# Patient Record
Sex: Male | Born: 1965 | Race: White | Hispanic: No | Marital: Single | Smoking: Former smoker
Health system: Southern US, Community
[De-identification: ages and names within clinical notes are randomized; demographics above are authoritative.]

## PROBLEM LIST (undated history)

## (undated) DIAGNOSIS — K219 Gastro-esophageal reflux disease without esophagitis: Secondary | ICD-10-CM

## (undated) DIAGNOSIS — E669 Obesity, unspecified: Secondary | ICD-10-CM

## (undated) DIAGNOSIS — I10 Essential (primary) hypertension: Secondary | ICD-10-CM

## (undated) DIAGNOSIS — E119 Type 2 diabetes mellitus without complications: Secondary | ICD-10-CM

## (undated) DIAGNOSIS — R079 Chest pain, unspecified: Secondary | ICD-10-CM

## (undated) DIAGNOSIS — I2510 Atherosclerotic heart disease of native coronary artery without angina pectoris: Secondary | ICD-10-CM

## (undated) DIAGNOSIS — R0683 Snoring: Secondary | ICD-10-CM

## (undated) DIAGNOSIS — G4730 Sleep apnea, unspecified: Secondary | ICD-10-CM

## (undated) DIAGNOSIS — E039 Hypothyroidism, unspecified: Secondary | ICD-10-CM

## (undated) DIAGNOSIS — K589 Irritable bowel syndrome without diarrhea: Secondary | ICD-10-CM

## (undated) DIAGNOSIS — E785 Hyperlipidemia, unspecified: Secondary | ICD-10-CM

## (undated) DIAGNOSIS — M1990 Unspecified osteoarthritis, unspecified site: Secondary | ICD-10-CM

## (undated) DIAGNOSIS — R1310 Dysphagia, unspecified: Secondary | ICD-10-CM

## (undated) HISTORY — DX: Hyperlipidemia, unspecified: E78.5

## (undated) HISTORY — DX: Essential (primary) hypertension: I10

## (undated) HISTORY — PX: SHOULDER ARTHROSCOPY W/ ROTATOR CUFF REPAIR: SHX2400

## (undated) HISTORY — PX: ANKLE SURGERY: SHX546

## (undated) HISTORY — DX: Hypothyroidism, unspecified: E03.9

## (undated) HISTORY — DX: Atherosclerotic heart disease of native coronary artery without angina pectoris: I25.10

## (undated) HISTORY — PX: CHOLECYSTECTOMY: SHX55

## (undated) HISTORY — DX: Irritable bowel syndrome, unspecified: K58.9

## (undated) HISTORY — PX: KNEE ARTHROSCOPY: SHX127

## (undated) HISTORY — DX: Sleep apnea, unspecified: G47.30

---

## 1999-11-22 ENCOUNTER — Ambulatory Visit (HOSPITAL_BASED_OUTPATIENT_CLINIC_OR_DEPARTMENT_OTHER): Admission: RE | Admit: 1999-11-22 | Discharge: 1999-11-22 | Payer: Self-pay | Admitting: Orthopedic Surgery

## 2000-07-07 ENCOUNTER — Ambulatory Visit (HOSPITAL_COMMUNITY): Admission: RE | Admit: 2000-07-07 | Discharge: 2000-07-07 | Payer: Self-pay | Admitting: Orthopedic Surgery

## 2004-06-11 ENCOUNTER — Encounter: Admission: RE | Admit: 2004-06-11 | Discharge: 2004-06-11 | Payer: Self-pay | Admitting: Internal Medicine

## 2004-11-23 ENCOUNTER — Ambulatory Visit: Payer: Self-pay | Admitting: Internal Medicine

## 2005-01-18 ENCOUNTER — Ambulatory Visit: Payer: Self-pay | Admitting: Internal Medicine

## 2005-09-13 ENCOUNTER — Ambulatory Visit: Payer: Self-pay | Admitting: Internal Medicine

## 2005-10-04 ENCOUNTER — Encounter: Admission: RE | Admit: 2005-10-04 | Discharge: 2005-10-04 | Payer: Self-pay | Admitting: General Surgery

## 2005-10-04 ENCOUNTER — Ambulatory Visit: Payer: Self-pay | Admitting: Internal Medicine

## 2005-11-20 ENCOUNTER — Encounter (INDEPENDENT_AMBULATORY_CARE_PROVIDER_SITE_OTHER): Payer: Self-pay | Admitting: *Deleted

## 2005-11-20 ENCOUNTER — Ambulatory Visit (HOSPITAL_COMMUNITY): Admission: RE | Admit: 2005-11-20 | Discharge: 2005-11-20 | Payer: Self-pay | Admitting: General Surgery

## 2005-12-18 ENCOUNTER — Encounter: Admission: RE | Admit: 2005-12-18 | Discharge: 2005-12-18 | Payer: Self-pay | Admitting: General Surgery

## 2007-11-04 ENCOUNTER — Ambulatory Visit: Payer: Self-pay | Admitting: Family Medicine

## 2007-11-04 DIAGNOSIS — F17200 Nicotine dependence, unspecified, uncomplicated: Secondary | ICD-10-CM | POA: Insufficient documentation

## 2007-11-04 DIAGNOSIS — J019 Acute sinusitis, unspecified: Secondary | ICD-10-CM

## 2010-12-06 ENCOUNTER — Encounter: Payer: Self-pay | Admitting: Family Medicine

## 2010-12-06 ENCOUNTER — Encounter: Payer: Self-pay | Admitting: Internal Medicine

## 2010-12-07 ENCOUNTER — Encounter: Payer: Self-pay | Admitting: Internal Medicine

## 2010-12-07 ENCOUNTER — Ambulatory Visit (INDEPENDENT_AMBULATORY_CARE_PROVIDER_SITE_OTHER): Payer: Self-pay | Admitting: Internal Medicine

## 2010-12-07 DIAGNOSIS — R079 Chest pain, unspecified: Secondary | ICD-10-CM

## 2010-12-07 LAB — BASIC METABOLIC PANEL
BUN: 10 mg/dL (ref 6–23)
CO2: 28 mEq/L (ref 19–32)
Calcium: 9.2 mg/dL (ref 8.4–10.5)
Chloride: 105 mEq/L (ref 96–112)
Creatinine, Ser: 1.1 mg/dL (ref 0.4–1.5)
GFR: 76.62 mL/min (ref >60.00)
Glucose, Bld: 96 mg/dL (ref 70–99)
Potassium: 4.3 mEq/L (ref 3.5–5.1)
Sodium: 140 mEq/L (ref 135–145)

## 2010-12-07 LAB — LDL CHOLESTEROL, DIRECT: Direct LDL: 190.5 mg/dL

## 2010-12-07 LAB — LIPID PANEL
Cholesterol: 241 mg/dL — ABNORMAL HIGH (ref 0–200)
HDL: 31.8 mg/dL — ABNORMAL LOW (ref >39.00)
Total CHOL/HDL Ratio: 8
Triglycerides: 134 mg/dL (ref 0.0–149.0)
VLDL: 26.8 mg/dL (ref 0.0–40.0)

## 2010-12-07 LAB — TSH: TSH: 9.32 u[IU]/mL — ABNORMAL HIGH (ref 0.35–5.50)

## 2010-12-07 MED ORDER — OLMESARTAN MEDOXOMIL 20 MG PO TABS: 20.0000 mg | ORAL_TABLET | Freq: Every day | ORAL | Status: DC

## 2010-12-07 NOTE — Progress Notes (Signed)
  Subjective:    Patient ID: Gabriel [name], male    DOB: 04-Apr-1966, 45 y.o.   MRN: [id number]  HPI  The pt has strong family risks for CAD and has gained significant weight. This has resulted in increased blood pressure, multiple musculo-skeletal complaints and GERD. He has noted mid sternal chest pain after lifting his dog. He was SOB and felt pulses in temples were increased. He has asthma/early COPD.      Review of Systems  Constitutional: Positive for activity change, appetite change and fatigue. Negative for fever.  HENT: Negative for hearing loss, congestion, neck pain and postnasal drip.   Eyes: Negative for discharge, redness and visual disturbance.  Respiratory: Negative for cough, shortness of breath and wheezing.   Cardiovascular: Negative for leg swelling.  Gastrointestinal: Negative for abdominal pain, constipation and abdominal distention.  Genitourinary: Negative for urgency and frequency.  Musculoskeletal: Negative for joint swelling and arthralgias.  Skin: Negative for color change and rash.  Neurological: Negative for weakness and light-headedness.  Hematological: Negative for adenopathy.  Psychiatric/Behavioral: Negative for behavioral problems.   No past medical history on file. Past Surgical History  Procedure Date  . Rotator cuff repair     reports that he quit smoking about 3 years ago. He does not have any smokeless tobacco history on file. He reports that he does not drink alcohol or use illicit drugs. family history includes Coronary artery disease in an unspecified family member; Diabetes in an unspecified family member; and Heart disease in an unspecified family member.  He is adopted. No Known Allergies      Objective:   Physical Exam  Constitutional: He appears well-developed and well-nourished.       Morbidly obese  HENT:  Head: Normocephalic and atraumatic.  Eyes: Conjunctivae are normal. Pupils are equal, round, and reactive to light.    Neck: Normal range of motion. Neck supple.  Cardiovascular: Normal rate and regular rhythm.   Pulmonary/Chest: Effort normal and breath sounds normal.  Abdominal: Soft. Bowel sounds are normal.          Assessment & Plan:  I believe this is atypical chest pain that is not currently related to cardiovascular disease but certainly has risk factors for developing cardiovascular disease.  His EKG today is normal his exam is normal except for morbid obesity.  His gain significant amount of weight which is affecting his esophagus and esophageal reflux is affecting his musculoskeletal in terms of degenerative knee disease as well as probable early hip and shoulder disease and is affecting his breathing and giving him a restrictive asthmatic picture and Tylenol on him to fully recover from the damage he did with smoking now that he is stopped smoking.  His blood pressure is elevated and we will need to control his blood pressure but most aggressively we'll need to begin a weight loss program or he will fall the family history of his mother and father early I do not see a need for a stress test but his blood pressure failed to be controlled initially felt not to control his then screening for cardiovascular disease will be important in the near-term future should his chest pain recur she'll pattern then a cardiovascular stress test should also be pursued.

## 2011-01-25 NOTE — Op Note (Signed)
[facility]  Patient:    Gabriel [name], Gabriel [name]                    MRN: [id number] Proc. Date: 11/22/99 Adm. Date:  [id number] Attending:  Marlowe Kays Page                           Operative Report  PREOPERATIVE DIAGNOSIS:  Torn medial meniscus right knee.  POSTOPERATIVE DIAGNOSIS:  Torn medial meniscus right knee.  PROCEDURE:  Right knee arthroscopy with partial medial meniscectomy.  SURGEON:  Illene Labrador. Aplington, M.D.  ASSISTANT:  Nurse.  ANESTHESIA:  MAC.  JUSTIFICATION FOR PROCEDURE:  He has had pain in the inner aspect of his right nee after stumbling down some steps, wrenching his knee.  He was originally seen by  another orthopedic surgeon in town, who had an MRI performed on May 04, 1999, which demonstrated a complex tear of the posterior horn of the medial meniscus.  Due to insurance change, he saw me on February 26 and scheduled for surgery at his time.  At surgery, he had the below mentioned pathology.  JUSTIFICATION FOR OUTPATIENT SETTING:  He was in satisfactory health and this is felt to be a low morbidity procedure.  PROCEDURE:  Satisfactory knee block by anesthesiologists, which I supplemented ith 0.5% Marcaine with adrenalin into superomedially portal.  A pneumatic tourniquet applied, but not inflated.  Thigh stabilizer and knee was prepped with Duraprep, draped in a sterile field.  Superomedial saline inflow, first translateral portal, medial compartment of knee joint was evaluated.  In the anterior joint, he had quite a bit of fibrotic synovium overlying the meniscus, which I resected with  3.5 shaver.  His joint surfaces looked fine.  Looking in the back, exploring the posterior medial meniscus with a probe, on the superior weightbearing surface, o abnormality was evident.  However, I kept probing because of the MRI finding and at the posterior curve, he had a cleavage tear along the synovial margin for  about a cm, which was demonstrated with a probe.  I then resected the meniscus back to  stable rim with baskets and 3.5 shaver making sure to taper the two ends of the  resection so that this was a smooth taper.  Post resection films were taken at his time of this dictation, we were trying to double check on having them processed  because they came out black on the initial printing.  The initial four films of the anterior joint and the probing of the underneath surface of tear came out normally. I then looked up in the medial gutters and suprapatellar area and no abnormalities were noted.  I then reversed portals, his ACL was intact.  There was some synovitis laterally, which did not interfere with viewing of the joint, which was normal. A picture of the lateral meniscus was taken as well.  The knee joint was then irrigated until clear and all fluid possible was removed.  The two anterior portals were closed with 4-0 nylon, 20 cc 0.5% Marcaine with adrenalin, 4 mg of morphine were then instilled through the inflow apparatus, which was removed and this portal closed with 4-0 nylon as well.  Betadine, Adaptic, dry sterile dressing were applied.  He tolerated the procedure well and was taken to recovery room in satisfactory condition with no known complications. DD:  11/22/99 TD:  11/22/99 Job: [id number]

## 2011-01-25 NOTE — Op Note (Signed)
NAME:  Gabriel [name], Gabriel [name]             ACCOUNT NO.:  [id number]   MEDICAL RECORD NO.:  [id number]          PATIENT TYPE:  AMB   LOCATION:  DAY                          FACILITY:  [facility]   PHYSICIAN:  Ollen Gross. Vernell Morgans, M.D. DATE OF BIRTH:  [birth year]   DATE OF PROCEDURE:  11/20/2005  DATE OF DISCHARGE:  11/20/2005                                 OPERATIVE REPORT   PREOPERATIVE DIAGNOSIS:  Gallstones.   POSTOPERATIVE DIAGNOSIS:  Cholecystitis with cholelithiasis.   PROCEDURES:  Laparoscopic cholecystectomy with intraoperative cholangiogram.   SURGEON:  Ollen Gross. Carolynne Edouard, M.D.   ANESTHESIA:  General endotracheal.   ASSISTANT:  Dr. Orson Slick.   PROCEDURE:  After informed consent was obtained, the patient was brought to  the operating room, placed in a supine position on the operating table.  After adequate induction of general anesthesia, the patient's abdomen was  prepped with Betadine and draped in the usual sterile manner. The area below  the umbilicus was infiltrated with 0.25% Marcaine, a small incision was made  with a 15 blade knife. This incision was carried down through the  subcutaneous tissue bluntly with a hemostat and [employer] retractors until  the linea alba was identified. The line alba was incised with a 15 blade  knife and each side was grasped Kocher clamps and elevated anteriorly. The  preperitoneal space was then probed bluntly with a hemostat until the  peritoneum was opened and access was gained to the abdominal cavity. A #0  Vicryl pursestring stitch was placed in the fascia surrounding the opening,  Hasson cannula was placed through the opening and anchored in place with the  previously placed Vicryl pursestring stitch. The abdomen was then  insufflated with carbon dioxide without difficulty. The patient was placed  in a head-up position, rotated slightly with the right side up. Next the a  laparoscope was then inserted through the Hasson cannula, right upper  quadrant  was inspected, the dome of the gallbladder and liver were readily  identified. Next the epigastric region was infiltrated with 0.25% Marcaine.  A small incision was made with a 15 blade knife and a 10 mm port was placed  bluntly through this incision into the abdominal cavity under direct vision.  Sites were then chosen laterally on the right side of the abdomen for  placement of 5 mm ports. Each of these areas was infiltrated with 0.25%  Marcaine. Small stab incisions were made with a 15 blade knife and 5 mm  ports were placed bluntly through these incisions into the abdominal cavity  under direct vision. A blunt grasper was placed through the lateral most 5  mm port and used to grasp the dome of gallbladder and elevate it anteriorly  and superiorly. Another blunt grasper was placed through the other 5 mm port  and used to retract on the body and neck of the gallbladder. A dissector was  then placed through the epigastric port and using the electrocautery, the  peritoneal reflection at the gallbladder neck area was opened. Blunt  dissection was then carried out in this area until the gallbladder neck  cystic  duct junction was readily identified and a good window was created. A  single clip was placed on the gallbladder neck, a small ductotomy was made  just below the clip with the laparoscopic scissors. A 14 gauge Angiocath was  then placed percutaneously through the anterior abdominal wall under direct  vision. The Reddick cholangiogram catheter was placed through the Angiocath  and flushed. The Reddick catheter was then placed in the cystic duct and  anchored in place with the clip. A cholangiogram was obtained that showed no  filling defects, good emptying into the duodenum and adequate length on the  cystic duct. The anchoring clip and catheters were then removed from the  patient. Three clips were placed proximally on the cystic duct and the duct  was divided between the 2 sets of  clips. Posterior to this, the cystic  artery was identified and was again dissected bluntly in a circumferential  manner until a good window was created. Two clips were placed proximally and  one distally on the artery and the artery was divided between the two. Next,  a laparoscopic hook cautery device was used to separate the gallbladder from  the liver bed. Prior to completely detaching the gallbladder from the liver  bed, the liver bed was inspected and several small bleeding points were  coagulated with the electrocautery until the area was completely hemostatic.  The gallbladder was then detached the rest of the way from the liver bed  without difficulty. Next, the laparoscopic bag was inserted through the  epigastric port, the gallbladder was replaced within the bag and the bag was  sealed. The abdomen was then irrigated with copious amounts of saline until  the effluent was clear. The laparoscope was then removed through the  epigastric port and a gallbladder grasper was placed through the Hasson  cannula and used to grasp the opening of the bag. The bag with the  gallbladder was then removed through the infraumbilical port with the Hasson  cannula without difficulty. The fascial defect was closed with the  previously placed pursestring stitch as well as with another figure-of-eight  #0 Vicryl stitch. The rest of the ports were then removed under direct  vision and were found to be hemostatic. Gas was allowed to escape, the skin  incisions were all closed with interrupted 4-0 Monocryl subcuticular  stitches. Benzoin, Steri-Strips and sterile dressings were applied. The  patient tolerated the procedure well. At the end of the case, all needle,  sponge and instrument counts were correct. The patient was then awakened and  taken to the recovery room in stable condition.      Ollen Gross. Vernell Morgans, M.D.  Electronically Signed    PST/MEDQ  D:  11/21/2005  T:  11/22/2005  Job:   [id number]

## 2011-01-25 NOTE — Op Note (Signed)
[facility]  Patient:    [name], [name]                      MRN: [id number] Proc. Date: 07/07/00 Attending:  Fayrene Fearing P. Aplington, M.D.                           Operative Report  PREOPERATIVE DIAGNOSIS:  Recurrent medial meniscus tear, right knee.  POSTOPERATIVE DIAGNOSIS:  Recurrent medial meniscus tear, right knee.  OPERATION:  Right knee arthroscopy with partial medial meniscectomy.  SURGEON:  Illene Labrador. Aplington, M.D.  ANESTHESIA:  MAC.  INDICATIONS:  His original arthroscopy and medial meniscectomy was done on November 22, 1999.  He did well following the procedure for quite a few months but came to see me the end of September with a history of recurrent pain in the inner aspect and posterior aspect of his right knee of six to eight weeks duration.  An MRI demonstrated recurrent medial meniscus tear.  No other abnormalities were noted in the joint, and this was confirmed at surgery.  DESCRIPTION OF PROCEDURE:  Knee block by anesthesiology.  I infiltrated the portals with 0.5% Xylocaine with Adrenalin.  Pneumatic tourniquet was applied but not inflated.  Thigh stabilizer.  He was prepped with DuraPrep and draped in a sterile field.  After the portals were infiltrated, through an anterior medial portal we first examined the medial knee joint.  There was a little bit of roughening of the anterior third of the medial meniscus and some synovitis which I resected.  His knee was quite tight.  Posteriorly he did have the comminuted tear noted on the MRI.  This was pictured and resected back to stable rim with a combination of small baskets and 3.5 shaver with also a small component of the tear going over into the intracondylar notch area. Final pictures taken.  I then looked up in the medial and subpatellar area. Minimal wear on the patella but nothing required surgically.  I then reversed the portals.  The ACL, lateral meniscus were intact.  The lateral  gutters were unremarkable.  The knee joint was then irrigated until clear.  All trocars were removed.  The transverse portals were closed with 4-0 nylon; 20 cc of Marcaine with Adrenalin and 4 mg of Morphine was instilled through the inflow cannula which was then removed and this portal closed with 4-0 nylon as well. ABD, Adaptic and dry sterile dressing were applied.  At the time of this dictation, the patient was on his way to the recovery room in satisfactory condition with no known complications. DD:  07/07/00 TD:  07/07/00 Job: [id number]

## 2011-01-28 ENCOUNTER — Telehealth: Payer: Self-pay | Admitting: Family Medicine

## 2011-01-28 NOTE — Telephone Encounter (Signed)
Pt called and said that he is normally a pt of Dr Lovell Sheehan, but for some reason the pcp on his chart has been changed to Dr Clent Ridges. Pt is req to remain pt of Dr Lovell Sheehan. Pls advise.

## 2011-01-28 NOTE — Telephone Encounter (Signed)
Changed in computer.

## 2011-02-06 ENCOUNTER — Encounter: Payer: Self-pay | Admitting: Family Medicine

## 2011-02-06 ENCOUNTER — Ambulatory Visit (INDEPENDENT_AMBULATORY_CARE_PROVIDER_SITE_OTHER): Payer: Self-pay | Admitting: Family Medicine

## 2011-02-06 VITALS — BP 114/78 | HR 93 | Temp 98.4°F | Resp 16 | Wt 285.0 lb

## 2011-02-06 DIAGNOSIS — E1169 Type 2 diabetes mellitus with other specified complication: Secondary | ICD-10-CM | POA: Insufficient documentation

## 2011-02-06 DIAGNOSIS — E785 Hyperlipidemia, unspecified: Secondary | ICD-10-CM

## 2011-02-06 DIAGNOSIS — I10 Essential (primary) hypertension: Secondary | ICD-10-CM | POA: Insufficient documentation

## 2011-02-06 DIAGNOSIS — E039 Hypothyroidism, unspecified: Secondary | ICD-10-CM | POA: Insufficient documentation

## 2011-02-06 MED ORDER — LEVOTHYROXINE SODIUM 100 MCG PO TABS: 100.0000 ug | ORAL_TABLET | Freq: Every day | ORAL | Status: DC

## 2011-02-06 MED ORDER — LOSARTAN POTASSIUM 100 MG PO TABS: 100.0000 mg | ORAL_TABLET | Freq: Every day | ORAL | Status: DC

## 2011-02-06 MED ORDER — TRIAMCINOLONE ACETONIDE 0.1 % EX CREA: TOPICAL_CREAM | Freq: Two times a day (BID) | CUTANEOUS | Status: AC

## 2011-02-06 NOTE — Progress Notes (Signed)
  Subjective:    Patient ID: Gabriel [name], male    DOB: 17-Sep-1965, 45 y.o.   MRN: [id number]  HPI Here to follow up on HTN after he was seen here by Dr. Lovell Sheehan 2 months ago for elevated BP. He was given samples for Benicar that day, and he has been taking since then. His BP went down to normal quickly, and it has been stable since. He had labs drawn that day that showed significant hypothyroidism, but for some reason he was not notified of this and it has not been treated. He knows he is very overweight and he has tried to adjust his diet. He gets almost no exercise, but he plans to start riding his bicycle soon. He has gained yet another pound since his last visit. He had some chest pains last time that were felt to be muscular in origin, and he has had no further chest pains since then. Also on his last lab report he had a very high LDL at 190. His glucose was fine.    Review of Systems  Constitutional: Negative.   Respiratory: Negative.   Cardiovascular: Negative.        Objective:   Physical Exam  Constitutional:       Morbidly obese  Neck: No thyromegaly present.  Cardiovascular: Normal rate, regular rhythm, normal heart sounds and intact distal pulses.   Pulmonary/Chest: Effort normal and breath sounds normal.  Lymphadenopathy:    He has no cervical adenopathy.          Assessment & Plan:  I strongly urged him to exercise and to lose weight. Getting his thyroid level under control will help of course. Start on Synthroid and recheck a TSH in 90 days. Recheck lipids in 6 months and treat if needed. His HTN is stable. Switch to generic meds.

## 2011-02-08 ENCOUNTER — Telehealth: Payer: Self-pay | Admitting: *Deleted

## 2011-02-08 MED ORDER — PREDNISONE (PAK) 10 MG PO TABS: 10.0000 mg | ORAL_TABLET | Freq: Every day | ORAL | Status: AC

## 2011-02-08 NOTE — Telephone Encounter (Signed)
Pt is still having problems with his poison ivy, and would like Dr. Clent Ridges to send in a steroid to [facility] [address].

## 2011-02-08 NOTE — Telephone Encounter (Signed)
Call in a 12 day taper pack of 10 mg Prednisone

## 2011-05-09 ENCOUNTER — Ambulatory Visit: Payer: Self-pay | Admitting: Internal Medicine

## 2011-06-14 ENCOUNTER — Ambulatory Visit: Payer: Self-pay | Admitting: Internal Medicine

## 2011-06-19 ENCOUNTER — Ambulatory Visit (INDEPENDENT_AMBULATORY_CARE_PROVIDER_SITE_OTHER): Payer: Self-pay | Admitting: Internal Medicine

## 2011-06-19 VITALS — BP 130/80 | HR 84 | Temp 98.2°F | Resp 20 | Ht 68.0 in | Wt 298.0 lb

## 2011-06-19 DIAGNOSIS — R635 Abnormal weight gain: Secondary | ICD-10-CM

## 2011-06-19 DIAGNOSIS — J329 Chronic sinusitis, unspecified: Secondary | ICD-10-CM

## 2011-06-19 DIAGNOSIS — I10 Essential (primary) hypertension: Secondary | ICD-10-CM

## 2011-06-19 DIAGNOSIS — E039 Hypothyroidism, unspecified: Secondary | ICD-10-CM

## 2011-06-19 DIAGNOSIS — J3089 Other allergic rhinitis: Secondary | ICD-10-CM

## 2011-06-19 LAB — T4, FREE: Free T4: 0.99 ng/dL (ref 0.60–1.60)

## 2011-06-19 LAB — BASIC METABOLIC PANEL
BUN: 8 mg/dL (ref 6–23)
CO2: 30 mEq/L (ref 19–32)
Calcium: 8.8 mg/dL (ref 8.4–10.5)
Chloride: 105 mEq/L (ref 96–112)
Creatinine, Ser: 1.1 mg/dL (ref 0.4–1.5)
GFR: 74.87 mL/min (ref >60.00)
Glucose, Bld: 150 mg/dL — ABNORMAL HIGH (ref 70–99)
Potassium: 4.2 mEq/L (ref 3.5–5.1)
Sodium: 141 mEq/L (ref 135–145)

## 2011-06-19 LAB — T3, FREE: T3, Free: 2.9 pg/mL (ref 2.3–4.2)

## 2011-06-19 LAB — TSH: TSH: 1.46 u[IU]/mL (ref 0.35–5.50)

## 2011-06-19 LAB — HEMOGLOBIN A1C: Hgb A1c MFr Bld: 6.8 % — ABNORMAL HIGH (ref 4.6–6.5)

## 2011-06-19 MED ORDER — FLUTICASONE FUROATE 27.5 MCG/SPRAY NA SUSP: 2.0000 | Freq: Every day | NASAL | Status: DC

## 2011-06-19 NOTE — Patient Instructions (Signed)
Portion control [employer] on line

## 2011-06-25 ENCOUNTER — Other Ambulatory Visit: Payer: Self-pay | Admitting: *Deleted

## 2011-06-25 MED ORDER — METFORMIN HCL 500 MG PO TABS: 500.0000 mg | ORAL_TABLET | Freq: Two times a day (BID) | ORAL | Status: DC

## 2011-07-22 ENCOUNTER — Ambulatory Visit (INDEPENDENT_AMBULATORY_CARE_PROVIDER_SITE_OTHER): Payer: Self-pay | Admitting: Internal Medicine

## 2011-07-22 DIAGNOSIS — Z0000 Encounter for general adult medical examination without abnormal findings: Secondary | ICD-10-CM

## 2011-07-22 DIAGNOSIS — Z23 Encounter for immunization: Secondary | ICD-10-CM

## 2011-07-23 ENCOUNTER — Ambulatory Visit: Payer: Self-pay | Admitting: Internal Medicine

## 2011-07-24 ENCOUNTER — Other Ambulatory Visit: Payer: Self-pay | Admitting: *Deleted

## 2011-07-24 ENCOUNTER — Telehealth: Payer: Self-pay | Admitting: *Deleted

## 2011-07-24 MED ORDER — CEPHALEXIN 500 MG PO CAPS: 500.0000 mg | ORAL_CAPSULE | Freq: Four times a day (QID) | ORAL | Status: DC

## 2011-07-24 MED ORDER — CEPHALEXIN 500 MG PO CAPS: 500.0000 mg | ORAL_CAPSULE | Freq: Four times a day (QID) | ORAL | Status: AC

## 2011-07-24 NOTE — Telephone Encounter (Signed)
Pt is afraid his tetanus injection is infected... Still hurts and red.  Requesting injection.

## 2011-07-24 NOTE — Telephone Encounter (Signed)
Talked with pt and it is the toe that he stuck with nail that is infected- per dr Lovell Sheehan may have keflex 500 qid for 10 days. And soak toe 3-4 times a day in warm salt water

## 2011-09-20 ENCOUNTER — Ambulatory Visit: Payer: Self-pay | Admitting: Internal Medicine

## 2012-03-01 ENCOUNTER — Other Ambulatory Visit: Payer: Self-pay | Admitting: Family Medicine

## 2012-04-03 ENCOUNTER — Other Ambulatory Visit: Payer: Self-pay | Admitting: Family Medicine

## 2012-06-08 ENCOUNTER — Encounter: Payer: Self-pay | Admitting: Internal Medicine

## 2012-06-08 NOTE — Progress Notes (Signed)
  Subjective:    Patient ID: Gabriel [name], male    DOB: June 08, 1966, 46 y.o.   MRN: [id number]  HPI Patient seen for acute sinusitis with headache   Review of Systems  HENT: Positive for congestion, rhinorrhea, neck pain and postnasal drip.   Neurological: Positive for headaches.       Objective:   Physical Exam  Constitutional: He appears well-developed and well-nourished.  HENT:  Head: Normocephalic and atraumatic.       Swollen nasal turbinates with mucopurulent discharge from the sinuses  Eyes: Conjunctivae normal are normal. Pupils are equal, round, and reactive to light.  Neck: Normal range of motion. Neck supple.  Cardiovascular: Normal rate and regular rhythm.   Pulmonary/Chest: Effort normal and breath sounds normal.  Abdominal: Soft. Bowel sounds are normal.          Assessment & Plan:  Acute sinusitis.  Antibiotic prescribed Avelox 400 mg by mouth daily for 7 days given a sample

## 2012-06-25 ENCOUNTER — Other Ambulatory Visit: Payer: Self-pay | Admitting: Internal Medicine

## 2012-09-28 ENCOUNTER — Other Ambulatory Visit: Payer: Self-pay | Admitting: Internal Medicine

## 2012-10-19 ENCOUNTER — Other Ambulatory Visit: Payer: Self-pay

## 2012-10-19 ENCOUNTER — Emergency Department (HOSPITAL_COMMUNITY): Payer: Self-pay

## 2012-10-19 ENCOUNTER — Emergency Department (HOSPITAL_COMMUNITY)
Admission: EM | Admit: 2012-10-19 | Discharge: 2012-10-19 | Disposition: A | Discharge status: Home / Self Care | Payer: Self-pay | Attending: Emergency Medicine | Admitting: Emergency Medicine

## 2012-10-19 ENCOUNTER — Encounter (HOSPITAL_COMMUNITY): Payer: Self-pay | Admitting: Emergency Medicine

## 2012-10-19 DIAGNOSIS — E119 Type 2 diabetes mellitus without complications: Secondary | ICD-10-CM | POA: Insufficient documentation

## 2012-10-19 DIAGNOSIS — M549 Dorsalgia, unspecified: Secondary | ICD-10-CM | POA: Insufficient documentation

## 2012-10-19 DIAGNOSIS — Z8719 Personal history of other diseases of the digestive system: Secondary | ICD-10-CM | POA: Insufficient documentation

## 2012-10-19 DIAGNOSIS — E669 Obesity, unspecified: Secondary | ICD-10-CM | POA: Insufficient documentation

## 2012-10-19 DIAGNOSIS — E039 Hypothyroidism, unspecified: Secondary | ICD-10-CM | POA: Insufficient documentation

## 2012-10-19 DIAGNOSIS — Z87891 Personal history of nicotine dependence: Secondary | ICD-10-CM | POA: Insufficient documentation

## 2012-10-19 DIAGNOSIS — R109 Unspecified abdominal pain: Secondary | ICD-10-CM | POA: Insufficient documentation

## 2012-10-19 DIAGNOSIS — E785 Hyperlipidemia, unspecified: Secondary | ICD-10-CM | POA: Insufficient documentation

## 2012-10-19 DIAGNOSIS — Z79899 Other long term (current) drug therapy: Secondary | ICD-10-CM | POA: Insufficient documentation

## 2012-10-19 DIAGNOSIS — I10 Essential (primary) hypertension: Secondary | ICD-10-CM | POA: Insufficient documentation

## 2012-10-19 HISTORY — DX: Obesity, unspecified: E66.9

## 2012-10-19 HISTORY — DX: Gastro-esophageal reflux disease without esophagitis: K21.9

## 2012-10-19 LAB — POCT I-STAT TROPONIN I: Troponin i, poc: 0 ng/mL (ref 0.00–0.08)

## 2012-10-19 LAB — CBC WITH DIFFERENTIAL/PLATELET
Basophils Absolute: 0.1 10*3/uL (ref 0.0–0.1)
Basophils Relative: 1 % (ref 0–1)
HCT: 43.2 % (ref 39.0–52.0)
Hemoglobin: 14.8 g/dL (ref 13.0–17.0)
Lymphocytes Relative: 40 % (ref 12–46)
MCHC: 34.3 g/dL (ref 30.0–36.0)
Monocytes Absolute: 0.7 10*3/uL (ref 0.1–1.0)
Monocytes Relative: 9 % (ref 3–12)
Neutro Abs: 3.8 10*3/uL (ref 1.7–7.7)
Neutrophils Relative %: 47 % (ref 43–77)
WBC: 8 10*3/uL (ref 4.0–10.5)

## 2012-10-19 LAB — TROPONIN I: Troponin I: <0.3 ng/mL (ref <0.30)

## 2012-10-19 LAB — COMPREHENSIVE METABOLIC PANEL
ALT: 42 U/L (ref 0–53)
BUN: 13 mg/dL (ref 6–23)
CO2: 26 mEq/L (ref 19–32)
Calcium: 9.2 mg/dL (ref 8.4–10.5)
Creatinine, Ser: 1.08 mg/dL (ref 0.50–1.35)
GFR calc Af Amer: >90 mL/min (ref >90)
GFR calc non Af Amer: 81 mL/min — ABNORMAL LOW (ref >90)
Glucose, Bld: 118 mg/dL — ABNORMAL HIGH (ref 70–99)
Total Protein: 7.2 g/dL (ref 6.0–8.3)

## 2012-10-19 MED ORDER — HYDROCODONE-ACETAMINOPHEN 5-325 MG PO TABS: 1.0000 | ORAL_TABLET | Freq: Four times a day (QID) | ORAL | Status: DC | PRN

## 2012-10-19 MED ORDER — MORPHINE SULFATE 4 MG/ML IJ SOLN
4.0000 mg | Freq: Once | INTRAMUSCULAR | Status: AC
  Administered 2012-10-19: 4 mg via INTRAVENOUS
  Filled 2012-10-19: qty 1

## 2012-10-19 MED ORDER — PROMETHAZINE HCL 25 MG PO TABS: 25.0000 mg | ORAL_TABLET | Freq: Four times a day (QID) | ORAL | Status: DC | PRN

## 2012-10-19 MED ORDER — IOHEXOL 300 MG/ML  SOLN
100.0000 mL | Freq: Once | INTRAMUSCULAR | Status: AC | PRN
  Administered 2012-10-19: 100 mL via INTRAVENOUS

## 2012-10-19 MED ORDER — IOHEXOL 300 MG/ML  SOLN
20.0000 mL | INTRAMUSCULAR | Status: AC
Start: 2012-10-19 — End: 2012-10-19

## 2012-10-19 NOTE — ED Notes (Signed)
Oral contrast given to patient with drinking instructions

## 2012-10-19 NOTE — ED Notes (Signed)
Ct notified that pt finished oral contrast.  

## 2012-10-19 NOTE — ED Notes (Signed)
Xray present at room

## 2012-10-19 NOTE — ED Notes (Signed)
Back from xray, no changes, family at [facility].

## 2012-10-19 NOTE — ED Notes (Signed)
To x-ray

## 2012-10-19 NOTE — ED Notes (Signed)
Pt reports taking advil 400mg  Q 6-8 hrs regularly for last several days.

## 2012-10-19 NOTE — ED Provider Notes (Signed)
History     CSN: [id number]  Arrival date & time 10/19/12  0554   First MD Initiated Contact with Patient 10/19/12 [phone]      Chief Complaint  Patient presents with  . Chest Pain    (Consider location/radiation/quality/duration/timing/severity/associated sxs/prior treatment) Patient is a 47 y.o. male presenting with chest pain and back pain.  Chest Pain Associated symptoms: back pain   Back Pain Associated symptoms: chest pain    Patient is a 46 yo male with a history of GERD, DM, Htn, and a 30 pack year history who presents toady with chest pain.  The pain started Friday morning in the right groin area where the patient has a history of a hernia from working for [employer] and lifting heavy items.  The pain then started to radiate to the right abdomen and then moved to the right middle back and anterior middle chest.  The pain was increased when laying flat and nothing seemed to make it better.  It was initially described as a sharp pain causing shortness of breath but now it's gone down to a dull pain 2/10.  Yesterday he started to notice that he was urinating less and it was burning with urination and was concerned for a kidney stone.  His bowel movements have also changed from having diarrhea to constipation over the last couple of days.  Has felt nauseated but denies fever, hematemesis, hematochezia, hematuria, recent trauma or cough.    Past Medical History  Diagnosis Date  . Hypothyroidism   . Hypertension   . Hyperlipidemia   . Diabetes mellitus without complication   . GERD (gastroesophageal reflux disease)   . Hypothyroidism   . Obesity     Past Surgical History  Procedure Laterality Date  . Rotator cuff repair    . Cholecystectomy    . Knee surgery    . Shoulder surgery      Family History  Problem Relation Age of Onset  . Adopted: Yes  . Coronary artery disease    . Heart disease    . Diabetes      History  Substance Use Topics  . Smoking status: Former  Smoker -- 1.50 packs/day    Quit date: 09/10/2007  . Smokeless tobacco: Not on file  . Alcohol Use: No      Review of Systems  Cardiovascular: Positive for chest pain.  Musculoskeletal: Positive for back pain.   All other systems negative except as documented in the HPI. All pertinent positives and negatives as reviewed in the HPI.  Allergies  Review of patient's allergies indicates no known allergies.  Home Medications   Current Outpatient Rx  Name  Route  Sig  Dispense  Refill  . EXPIRED: fluticasone (VERAMYST) 27.5 MCG/SPRAY nasal spray   Nasal   Place 2 sprays into the nose daily.   10 g   12   . ibuprofen (ADVIL,MOTRIN) 200 MG tablet   Oral   Take 200 mg by mouth every 6 (six) hours as needed.           [name] levothyroxine (SYNTHROID, LEVOTHROID) 100 MCG tablet      TAKE 1 TABLET BY MOUTH EVERY DAY   30 tablet   9   . losartan (COZAAR) 100 MG tablet      TAKE 1 TABLET BY MOUTH EVERY DAY   30 tablet   9   . metFORMIN (GLUCOPHAGE) 500 MG tablet      TAKE 1 TABLET BY MOUTH 2 TIMES  DAILY WITH A MEAL.   60 tablet   2   . Multiple Vitamins-Minerals (MULTIVITAMIN,TX-MINERALS) tablet   Oral   Take 1 tablet by mouth daily.             BP 143/78  Pulse 76  Temp(Src) 97.7 F (36.5 C) (Oral)  Resp 16  SpO2 98%  Physical Exam  Constitutional: He is oriented to person, place, and time. He appears well-developed and well-nourished.  HENT:  Head: Normocephalic and atraumatic.  Mouth/Throat: Oropharynx is clear and moist.  Eyes: Pupils are equal, round, and reactive to light.  Neck: Normal range of motion. Neck supple.  Cardiovascular: Normal rate, regular rhythm and normal heart sounds.  Exam reveals no gallop and no friction rub.   No murmur heard. Pulmonary/Chest: Effort normal and breath sounds normal. He exhibits tenderness.    Abdominal: Soft. Normal appearance. He exhibits no distension. There is tenderness. There is no rigidity, no guarding and no  CVA tenderness.    Musculoskeletal:       Thoracic back: He exhibits tenderness.       Back:  Neurological: He is alert and oriented to person, place, and time.  Psychiatric: He has a normal mood and affect. His speech is normal and behavior is normal. Judgment and thought content normal. Cognition and memory are normal.    ED Course  Procedures (including critical care time)  Pain is increased with palpation of the right middle back and middle anterior portion of the chest.   The patient has been monitored here in the ER with labs and x-rays. The patient does have cardiac risk factors but based on his HPI and PE along with his testing this seems unlikely to be cardiac and the patient is PERC negative. The patient has R lower abdominal pain in the area of an inguinal hernia but the hernia is not enlarged or incarcerated.   Will have patient return here as needed. Follow up with his primary care doctor.    MDM  MDM Reviewed: vitals, nursing note and previous chart Interpretation: labs, ECG, x-ray and CT scan            Carlyle Dolly, PA-C 10/19/12 1151

## 2012-10-19 NOTE — ED Notes (Signed)
EDP into room 

## 2012-10-19 NOTE — ED Provider Notes (Signed)
Medical screening examination/treatment/procedure(s) were performed by non-physician practitioner and as supervising physician I was immediately available for consultation/collaboration.  Ethelda Chick, MD 10/19/12 1201

## 2012-10-19 NOTE — ED Notes (Signed)
PT. REPORTS MID CHEST / RIGHT LATERAL RIBCAGE PAIN WITH SOB / NAUSEA FOR SEVERAL DAYS WORSE THIS MORNING , ALSO REPORTS HANDS AND FEET TINGLING .

## 2012-10-19 NOTE — ED Notes (Signed)
Pt alert, NAD, calm, interactive, resps e/u, speaking in clear complete sentences. C/o scattered multiple sx. Reports R inguinal groin pain 2d ago, h/o current hernia (resolved), also CP R flank /mid back pain (that he refers to as "ribcage pain muscular like a spasm"), also nausea and diarrhea (last BM this morning, watery), also woke gasping sob this morning ("no dx'd h/o OSA, but his brother has OSA and he thinks he does also"), also mentions chills, weak, mild intermitant sob at times, (denies: vomiting, cough, congestion, cold sx, fever, vomiting, constipation, claudication, DOE, recent travel, sick contacts, bleeding dizziness or other sx). Pain intermitant and worse with movement, pinpoints to R ribs at this time.   LS CTA, no edema noted, MAEx4, BS present x4, CMS intact x4. abd soft (big) NT

## 2013-04-20 ENCOUNTER — Other Ambulatory Visit: Payer: Self-pay | Admitting: Internal Medicine

## 2013-05-27 ENCOUNTER — Other Ambulatory Visit: Payer: Self-pay | Admitting: Internal Medicine

## 2013-08-09 ENCOUNTER — Emergency Department (HOSPITAL_COMMUNITY): Payer: Self-pay

## 2013-08-09 ENCOUNTER — Observation Stay (HOSPITAL_COMMUNITY)
Admission: EM | Admit: 2013-08-09 | Discharge: 2013-08-10 | Disposition: A | Discharge status: Home / Self Care | Payer: Self-pay | Attending: Emergency Medicine | Admitting: Emergency Medicine

## 2013-08-09 ENCOUNTER — Encounter (HOSPITAL_COMMUNITY): Payer: Self-pay | Admitting: Emergency Medicine

## 2013-08-09 DIAGNOSIS — E039 Hypothyroidism, unspecified: Secondary | ICD-10-CM | POA: Diagnosis present

## 2013-08-09 DIAGNOSIS — M545 Low back pain, unspecified: Secondary | ICD-10-CM | POA: Insufficient documentation

## 2013-08-09 DIAGNOSIS — I200 Unstable angina: Secondary | ICD-10-CM

## 2013-08-09 DIAGNOSIS — E785 Hyperlipidemia, unspecified: Secondary | ICD-10-CM | POA: Insufficient documentation

## 2013-08-09 DIAGNOSIS — E1169 Type 2 diabetes mellitus with other specified complication: Secondary | ICD-10-CM | POA: Diagnosis present

## 2013-08-09 DIAGNOSIS — Z79899 Other long term (current) drug therapy: Secondary | ICD-10-CM | POA: Insufficient documentation

## 2013-08-09 DIAGNOSIS — E1159 Type 2 diabetes mellitus with other circulatory complications: Secondary | ICD-10-CM | POA: Diagnosis present

## 2013-08-09 DIAGNOSIS — K219 Gastro-esophageal reflux disease without esophagitis: Secondary | ICD-10-CM | POA: Insufficient documentation

## 2013-08-09 DIAGNOSIS — E119 Type 2 diabetes mellitus without complications: Secondary | ICD-10-CM

## 2013-08-09 DIAGNOSIS — R079 Chest pain, unspecified: Principal | ICD-10-CM | POA: Insufficient documentation

## 2013-08-09 DIAGNOSIS — E669 Obesity, unspecified: Secondary | ICD-10-CM | POA: Insufficient documentation

## 2013-08-09 DIAGNOSIS — Z87891 Personal history of nicotine dependence: Secondary | ICD-10-CM | POA: Insufficient documentation

## 2013-08-09 DIAGNOSIS — R0602 Shortness of breath: Secondary | ICD-10-CM | POA: Insufficient documentation

## 2013-08-09 DIAGNOSIS — I10 Essential (primary) hypertension: Secondary | ICD-10-CM | POA: Diagnosis present

## 2013-08-09 HISTORY — DX: Chest pain, unspecified: R07.9

## 2013-08-09 HISTORY — DX: Snoring: R06.83

## 2013-08-09 LAB — POCT I-STAT TROPONIN I: Troponin i, poc: 0 ng/mL (ref 0.00–0.08)

## 2013-08-09 LAB — CBC
HCT: 41.5 % (ref 39.0–52.0)
Hemoglobin: 14.3 g/dL (ref 13.0–17.0)
MCH: 29.5 pg (ref 26.0–34.0)
MCV: 85.7 fL (ref 78.0–100.0)
Platelets: 254 10*3/uL (ref 150–400)
RBC: 4.81 MIL/uL (ref 4.22–5.81)
RDW: 13.3 % (ref 11.5–15.5)
WBC: 8.4 10*3/uL (ref 4.0–10.5)
WBC: 7 10*3/uL (ref 4.0–10.5)

## 2013-08-09 LAB — GLUCOSE, CAPILLARY
Glucose-Capillary: 112 mg/dL — ABNORMAL HIGH (ref 70–99)
Glucose-Capillary: 118 mg/dL — ABNORMAL HIGH (ref 70–99)

## 2013-08-09 LAB — BASIC METABOLIC PANEL
Calcium: 9.5 mg/dL (ref 8.4–10.5)
Creatinine, Ser: 1.06 mg/dL (ref 0.50–1.35)
GFR calc Af Amer: >90 mL/min (ref >90)
Sodium: 139 mEq/L (ref 135–145)

## 2013-08-09 LAB — CREATININE, SERUM
Creatinine, Ser: 0.99 mg/dL (ref 0.50–1.35)
GFR calc Af Amer: >90 mL/min (ref >90)
GFR calc non Af Amer: >90 mL/min (ref >90)

## 2013-08-09 LAB — TSH: TSH: 5.67 u[IU]/mL — ABNORMAL HIGH (ref 0.350–4.500)

## 2013-08-09 LAB — TROPONIN I: Troponin I: <0.3 ng/mL (ref <0.30)

## 2013-08-09 LAB — PROTIME-INR: INR: 0.99 (ref 0.00–1.49)

## 2013-08-09 LAB — T4, FREE: Free T4: 1.42 ng/dL (ref 0.80–1.80)

## 2013-08-09 MED ORDER — SODIUM CHLORIDE 0.9 % IV SOLN: 250.0000 mL | INTRAVENOUS | Status: DC | PRN

## 2013-08-09 MED ORDER — ACETAMINOPHEN 325 MG PO TABS: 650.0000 mg | ORAL_TABLET | ORAL | Status: DC | PRN

## 2013-08-09 MED ORDER — METOPROLOL TARTRATE 25 MG PO TABS
25.0000 mg | ORAL_TABLET | Freq: Two times a day (BID) | ORAL | Status: DC
  Administered 2013-08-09 – 2013-08-10 (×3): 25 mg via ORAL
  Filled 2013-08-09 (×5): qty 1

## 2013-08-09 MED ORDER — NITROGLYCERIN 0.4 MG SL SUBL: 0.4000 mg | SUBLINGUAL_TABLET | SUBLINGUAL | Status: DC | PRN

## 2013-08-09 MED ORDER — ASPIRIN 325 MG PO TABS
325.0000 mg | ORAL_TABLET | Freq: Once | ORAL | Status: AC
  Administered 2013-08-09: 325 mg via ORAL
  Filled 2013-08-09: qty 1

## 2013-08-09 MED ORDER — SODIUM CHLORIDE 0.9 % IJ SOLN: 3.0000 mL | INTRAMUSCULAR | Status: DC | PRN

## 2013-08-09 MED ORDER — ONDANSETRON HCL 4 MG/2ML IJ SOLN: 4.0000 mg | Freq: Four times a day (QID) | INTRAMUSCULAR | Status: DC | PRN

## 2013-08-09 MED ORDER — GI COCKTAIL ~~LOC~~
30.0000 mL | Freq: Once | ORAL | Status: AC
  Administered 2013-08-09: 30 mL via ORAL
  Filled 2013-08-09: qty 30

## 2013-08-09 MED ORDER — SUPER HIGH VITAMINS/MINERALS PO TABS: 1.0000 | ORAL_TABLET | Freq: Every day | ORAL | Status: DC

## 2013-08-09 MED ORDER — LOSARTAN POTASSIUM 50 MG PO TABS
100.0000 mg | ORAL_TABLET | Freq: Every day | ORAL | Status: DC
  Administered 2013-08-10: 100 mg via ORAL
  Filled 2013-08-09: qty 2

## 2013-08-09 MED ORDER — SODIUM CHLORIDE 0.9 % IJ SOLN
3.0000 mL | Freq: Two times a day (BID) | INTRAMUSCULAR | Status: DC
  Administered 2013-08-09 (×2): 3 mL via INTRAVENOUS

## 2013-08-09 MED ORDER — ADULT MULTIVITAMIN W/MINERALS CH
1.0000 | ORAL_TABLET | Freq: Every day | ORAL | Status: DC
  Administered 2013-08-10: 1 via ORAL
  Filled 2013-08-09: qty 1

## 2013-08-09 MED ORDER — INSULIN ASPART 100 UNIT/ML ~~LOC~~ SOLN: 0.0000 [IU] | Freq: Three times a day (TID) | SUBCUTANEOUS | Status: DC

## 2013-08-09 MED ORDER — ASPIRIN EC 81 MG PO TBEC
81.0000 mg | DELAYED_RELEASE_TABLET | Freq: Every day | ORAL | Status: DC
  Administered 2013-08-10: 81 mg via ORAL
  Filled 2013-08-09: qty 1

## 2013-08-09 MED ORDER — ATORVASTATIN CALCIUM 80 MG PO TABS
80.0000 mg | ORAL_TABLET | Freq: Every day | ORAL | Status: DC
  Administered 2013-08-09 – 2013-08-10 (×2): 80 mg via ORAL
  Filled 2013-08-09 (×2): qty 1

## 2013-08-09 MED ORDER — HEPARIN SODIUM (PORCINE) 5000 UNIT/ML IJ SOLN
5000.0000 [IU] | Freq: Three times a day (TID) | INTRAMUSCULAR | Status: DC
  Administered 2013-08-09 – 2013-08-10 (×3): 5000 [IU] via SUBCUTANEOUS
  Filled 2013-08-09 (×6): qty 1

## 2013-08-09 MED ORDER — LEVOTHYROXINE SODIUM 100 MCG PO TABS
100.0000 ug | ORAL_TABLET | Freq: Every day | ORAL | Status: DC
  Administered 2013-08-10: 100 ug via ORAL
  Filled 2013-08-09 (×2): qty 1

## 2013-08-09 MED ORDER — SODIUM CHLORIDE 0.9 % IV SOLN
INTRAVENOUS | Status: DC
  Administered 2013-08-10: 04:00:00 via INTRAVENOUS

## 2013-08-09 NOTE — ED Notes (Signed)
Patient transported to X-ray 

## 2013-08-09 NOTE — H&P (Signed)
Patient ID: BRENTON JOINES MRN: [id number], DOB/AGE: 1966/08/03   Admit date: 08/09/2013  Primary Physician: Carrie Mew, MD Primary Cardiologist: new to   - seen by J. Shenea Giacobbe, MD   Pt. Profile:  47 y/o male w/o prior cardiac hx who presented to the ED overnight with chest pain.  Problem List  Past Medical History  Diagnosis Date  . Hypertension     a. Dx 3y ago.  [name] Hyperlipidemia     a. Dx 3y ago - not on statin.  . Diabetes mellitus without complication     a. Dx 3y ago.  [name] GERD (gastroesophageal reflux disease)   . Hypothyroidism     a. on replacement.  . Obesity   . Snoring     Past Surgical History  Procedure Laterality Date  . Rotator cuff repair    . Cholecystectomy    . Knee surgery    . Shoulder surgery      Allergies  No Known Allergies  HPI  47 y/o male w/o prior cardiac history but with multiple risk factors including HTN, DM, HL, obesity, h/o tobacco abuse, and significant FH of CAD with his father having an MI @ 90, who was in his usual state of health until about 9:30pm, when while manipulating a grandfather clock, he developed 7/10 retrosternal chest heaviness w/o associated Ss.  He took some ibuprofen and felt as though the discomfort eased to about a 4/10 over a period of about 3-4 hrs.  He finally laid down for bed @ 2:00 and upon lying down, he noted mild dyspnea.  He remained somewhat restless between 2a and 4a and finally decided to come into the ER for evaluation.  Prior to leaving his home, he had to walk up a few steps and felt more SOB than usual.  In the ED, ECG is non-acute and initial poc troponin is normal.  He was given ASA and GI cocktail @ 6:30am with resolution of chest tightness by 7:30am.  Since then, he has had intermittent, fleeting, shock-like right upper chest pain.  He denies palpitations, pnd, n, v, dizziness, syncope, edema, weight gain, or early satiety.   Home Medications  Prior to Admission medications     Medication Sig Start Date End Date Taking? Authorizing Provider  acetaminophen (TYLENOL) 500 MG tablet Take 1,000 mg by mouth every 6 (six) hours as needed for mild pain or moderate pain.   Yes Historical Provider, MD  ibuprofen (ADVIL,MOTRIN) 200 MG tablet Take 400 mg by mouth every 6 (six) hours as needed for mild pain or moderate pain.   Yes Historical Provider, MD  levothyroxine (SYNTHROID, LEVOTHROID) 100 MCG tablet Take 100 mcg by mouth daily.   Yes Historical Provider, MD  losartan (COZAAR) 100 MG tablet Take 100 mg by mouth daily.   Yes Historical Provider, MD  metFORMIN (GLUCOPHAGE) 500 MG tablet Take 500 mg by mouth 2 (two) times daily with a meal. Hold metformin x 48 hours after receiving IV contrast for CT a/p on 10/19/2012. BWagner, RT,-(R),(CT)   Yes Historical Provider, MD  Multiple Vitamins-Minerals (MULTIVITAMIN,TX-MINERALS) tablet Take 1 tablet by mouth daily.     Yes Historical Provider, MD   Family History  Family History  Problem Relation Age of Onset  . Adopted: Yes  . Coronary artery disease Father     s/p MI in his early 30's->CABG x 5 @ age 19, currently 30.  . Diabetes Father   . Stroke Father   . Hypertension  Father   . Hypertension Mother   . Obesity Mother   . Diabetes Mother   . Diabetes Brother   . Obesity Brother    Social History  History   Social History  . Marital Status: Single    Spouse Name: N/A    Number of Children: N/A  . Years of Education: N/A   Occupational History  . Not on file.   Social History Main Topics  . Smoking status: Former Smoker -- 1.50 packs/day for 24 years    Quit date: 09/10/2007  . Smokeless tobacco: Not on file  . Alcohol Use: No  . Drug Use: No  . Sexual Activity: Not on file   Other Topics Concern  . Not on file   Social History Narrative   Lives in [place] with parents.  Unemployed.  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    Review of Systems General:  No chills, fever, night sweats or weight  changes.  Cardiovascular:  +++ chest pain, +++ dyspnea on exertion, edema, +++ orthopnea, palpitations, paroxysmal nocturnal dyspnea. Dermatological: No rash, lesions/masses Respiratory: +++ cough, dyspnea Urologic: No hematuria, dysuria Abdominal:   No nausea, vomiting, diarrhea, bright red blood per rectum, melena, or hematemesis Neurologic:  No visual changes, wkns, changes in mental status. All other systems reviewed and are otherwise negative except as noted above.  Physical Exam  Blood pressure 122/90, pulse 70, temperature 98.2 F (36.8 C), temperature source Oral, resp. rate 15, height 5\' 8"  (1.727 m), weight 290 lb (131.543 kg), SpO2 98.00%.  General: Pleasant, NAD Psych: Normal affect. Neuro: Alert and oriented X 3. Moves all extremities spontaneously. HEENT: Normal  Neck: Supple without bruits or JVD. Lungs:  Resp regular and unlabored, CTA. Heart: RRR no s3, s4, or murmurs. Abdomen: Soft, non-tender, non-distended, BS + x 4.  Extremities: No clubbing, cyanosis or edema. DP/PT/Radials 2+ and equal bilaterally.  Labs  Troponin [facility] of Care Test)  Recent Labs  08/09/13 0549  TROPIPOC 0.00   No results found for this basename: CKTOTAL, CKMB, TROPONINI,  in the last 72 hours Lab Results  Component Value Date   WBC 8.4 08/09/2013   HGB 14.2 08/09/2013   HCT 41.2 08/09/2013   MCV 85.7 08/09/2013   PLT 254 08/09/2013     Recent Labs Lab 08/09/13 0546  NA 139  K 3.6  CL 102  CO2 28  BUN 13  CREATININE 1.06  CALCIUM 9.5  GLUCOSE 110*   Radiology/Studies  Dg Chest 2 View  08/09/2013   *RADIOLOGY REPORT*  Clinical Data: Chest pain  CHEST - 2 VIEW  IMPRESSION: Mild interstitial coarsening.  No focal consolidation.   Original Report Authenticated By: Jearld Lesch, M.D.   ECG  Rsr, 77, no acute st/t changes.  ASSESSMENT AND PLAN  1.  [place]:  Pt presented with an 8 hr h/o persistent chest tightness associated with orthopnea.  ECG is non-acute and he is  currently pain free following GI cocktail.  Initial troponin is 0.00.  Typical and atypical features to chest pain.  He has multiple risk factors including HTN, HL, DM, obesity, FH, h/o tob abuse.  Will plan to observe, cycle CE.  Add asa, bb, statin.  Hold metformin.  If CE remain negative, will plan on cardiac CT tomorrow to assess anatomy.  If he rules in-->IV heparin and cath in AM.  2.  HTN:  Stable.  Cont ARB.  3.  HL:  Not on a statin - will add in  setting of possible ACS.  Check lipids/lft's.  4.  DM:  Hold metformin in setting of pending contrast.  Add SSI.  5.  Obesity: nutritional counseling.  6.  Snoring:  ? OSA.  Has never been tested.  Should have sleep study as outpt.   7.  Hypothyroidism:  Check tsh.  Cont synthroid.  Signed, Nicolasa Ducking, NP 08/09/2013, 10:06 AM   History and all data above reviewed.  Patient examined.  I agree with the findings as above.  Chest pain.  Atypical greater than typical features.  (Moderate level of suspicion.)  However, significant risk factors.  The patient exam reveals COR:RRR  ,  Lungs: Clear  ,  Abd: Positive bowel sounds, no rebound no guarding, Ext No edema  .  All available labs, radiology testing, previous records reviewed. Agree with documented assessment and plan.  Heart Score is 4.  Given this he will be admitted for observation.  If he has negative enzymes he will have a coronary CTA.  If enzymes positive cath.  Either way he needs significant risk reduction.       Fayrene Fearing Natoria Archibald  10:26 AM  08/09/2013

## 2013-08-09 NOTE — ED Provider Notes (Signed)
Patient seen/examined in the Emergency Department in conjunction with Midlevel Provider [place] Patient reports chest pain Exam : awake/alert, he is nearly CP free Plan: cardiology consultation.  Pt currently stable BP 122/90  Pulse 70  Temp(Src) 98.2 F (36.8 C) (Oral)  Resp 15  Ht 5\' 8"  (1.727 m)  Wt 290 lb (131.543 kg)  BMI 44.10 kg/m2  SpO2 98%   Joya Gaskins, MD 08/09/13 0840

## 2013-08-09 NOTE — ED Notes (Signed)
Pt. reports mid chest pain onset 10 pm last night , denies SOB , nausea or diaphoresis . Pt. also reported low back pain and right lateral abdominal pain .

## 2013-08-09 NOTE — ED Provider Notes (Signed)
CSN: [id number]     Arrival date & time 08/09/13  0533 History   First MD Initiated Contact with Patient 08/09/13 [phone]     Chief Complaint  Patient presents with  . Chest Pain   (Consider location/radiation/quality/duration/timing/severity/associated sxs/prior Treatment) HPI Comments: Patient is a 47 year old male with history of hypothyroidism, hypertension, hyperlipidemia, diabetes, obesity who presents today with mid chest pain since 10 PM last night. The chest pain is burning quality. The pain has been constant since 10 PM last night. It has improved from a 7/10 to a 2/10 currently. He denies any associated nausea, diaphoresis. He reports that he did become slightly short of breath when he was getting the car warmed up to come here. He believes his symptoms began when he was trying to level a grandfather clock. This required quite a bit of lifting. He also reports that he has been struggling with low back pain, but today his pain was the least that it has ever been in his back. He has a positive family history for cardiac disease with his father having an MI at the age of 58. He does not smoke cigarettes or drink alcohol.  The history is provided by the patient. No language interpreter was used.    Past Medical History  Diagnosis Date  . Hypothyroidism   . Hypertension   . Hyperlipidemia   . Diabetes mellitus without complication   . GERD (gastroesophageal reflux disease)   . Hypothyroidism   . Obesity    Past Surgical History  Procedure Laterality Date  . Rotator cuff repair    . Cholecystectomy    . Knee surgery    . Shoulder surgery     Family History  Problem Relation Age of Onset  . Adopted: Yes  . Coronary artery disease    . Heart disease    . Diabetes     History  Substance Use Topics  . Smoking status: Former Smoker -- 1.50 packs/day    Quit date: 09/10/2007  . Smokeless tobacco: Not on file  . Alcohol Use: No    Review of Systems  Constitutional: Negative  for fever and chills.  Respiratory: Positive for shortness of breath.   Cardiovascular: Positive for chest pain.  Gastrointestinal: Negative for nausea and vomiting.  All other systems reviewed and are negative.    Allergies  Review of patient's allergies indicates no known allergies.  Home Medications   Current Outpatient Rx  Name  Route  Sig  Dispense  Refill  . levothyroxine (SYNTHROID, LEVOTHROID) 100 MCG tablet   Oral   Take 100 mcg by mouth daily.         [name] losartan (COZAAR) 100 MG tablet   Oral   Take 100 mg by mouth daily.         . metFORMIN (GLUCOPHAGE) 500 MG tablet   Oral   Take 500 mg by mouth 2 (two) times daily with a meal. Hold metformin x 48 hours after receiving IV contrast for CT a/p on 10/19/2012. BWagner, RT,-(R),(CT)         . Multiple Vitamins-Minerals (MULTIVITAMIN,TX-MINERALS) tablet   Oral   Take 1 tablet by mouth daily.            BP 146/83  Temp(Src) 98.2 F (36.8 C) (Oral)  Resp 16  Ht 5\' 8"  (1.727 m)  Wt 290 lb (131.543 kg)  BMI 44.10 kg/m2  SpO2 99% Physical Exam  Nursing note and vitals reviewed. Constitutional: He is oriented to person,  place, and time. He appears well-developed and well-nourished. No distress.  HENT:  Head: Normocephalic and atraumatic.  Right Ear: External ear normal.  Left Ear: External ear normal.  Nose: Nose normal.  Eyes: Conjunctivae are normal.  Neck: Normal range of motion. No tracheal deviation present.  Cardiovascular: Normal rate, regular rhythm, normal heart sounds, intact distal pulses and normal pulses.   Pulmonary/Chest: Effort normal and breath sounds normal. No stridor.  Abdominal: Soft. He exhibits no distension. There is no tenderness.  Musculoskeletal: Normal range of motion.  Neurological: He is alert and oriented to person, place, and time.  Skin: Skin is warm and dry. He is not diaphoretic.  Psychiatric: He has a normal mood and affect. His behavior is normal.    ED Course   Procedures (including critical care time) Labs Review Labs Reviewed  BASIC METABOLIC PANEL - Abnormal; Notable for the following:    Glucose, Bld 110 (*)    GFR calc non Af Amer 82 (*)    All other components within normal limits  CBC  POCT I-STAT TROPONIN I   Imaging Review No results found.  EKG Interpretation    Date/Time:  Monday August 09 2013 05:38:01 EST Ventricular Rate:  77 PR Interval:  148 QRS Duration: 94 QT Interval:  390 QTC Calculation: 441 R Axis:   -13 Text Interpretation:  Sinus rhythm Abnormal R-wave progression, early transition Baseline wander in lead(s) V1 No significant change since last tracing Confirmed by OTTER  MD, OLGA ([id number]) on 08/09/2013 5:43:29 AM           7:19 AM Discussed case with Dr. Al Corpus who recommends cardiology consult for possible d/c home. Call at [phone].   7:57 AM No response from 2 pages to cardiology. Dr. Al Corpus is now leaving his phone. Will repage hospitalist if cardiology recommends admission.  8:36 AM Cardiology will see the patient within the next 30 minutes.   MDM   1. Chest pain    Patient presents with chest pain since last night. He is high risk. Chest pain is currently 0/10. First troponin is negative. He will be admitted to the cardiology service. The admission is appreciated. Vital signs stable at this time. Patient / Family / Caregiver informed of clinical course, understand medical decision-making process, and agree with plan.     Mora Bellman, PA-C 08/09/13 1037

## 2013-08-09 NOTE — ED Notes (Signed)
Did teaching on dietary low salt, no cholesterol, eat more chicken and fish less red meat, encouraged patient to try to perform moderate exercise three times a week ( for example walking with his spouse). Also encourge patient to monitor his blood pressure and follow patient blood sugar level.

## 2013-08-09 NOTE — ED Provider Notes (Signed)
Medical screening examination/treatment/procedure(s) were conducted as a shared visit with non-physician practitioner(s) and myself.  I personally evaluated the patient during the encounter.  EKG Interpretation    Date/Time:  Monday August 09 2013 05:38:01 EST Ventricular Rate:  77 PR Interval:  148 QRS Duration: 94 QT Interval:  390 QTC Calculation: 441 R Axis:   -13 Text Interpretation:  Sinus rhythm Abnormal R-wave progression, early transition Baseline wander in lead(s) V1 No significant change since last tracing Confirmed by OTTER  MD, OLGA ([id number]) on 08/09/2013 5:43:29 AM             Pt improving on my exam, no distress noted but I did advise admission Pt admitted  Joya Gaskins, MD 08/09/13 1630

## 2013-08-09 NOTE — ED Notes (Signed)
Patient started having chest pain late last night, about 2200, pt took 2 81mg  asa, patient drove himself in the er

## 2013-08-10 ENCOUNTER — Observation Stay (HOSPITAL_COMMUNITY): Payer: Self-pay

## 2013-08-10 ENCOUNTER — Encounter (HOSPITAL_COMMUNITY): Payer: Self-pay | Admitting: Nurse Practitioner

## 2013-08-10 DIAGNOSIS — E119 Type 2 diabetes mellitus without complications: Secondary | ICD-10-CM

## 2013-08-10 DIAGNOSIS — R079 Chest pain, unspecified: Secondary | ICD-10-CM

## 2013-08-10 LAB — COMPREHENSIVE METABOLIC PANEL
Albumin: 3.7 g/dL (ref 3.5–5.2)
BUN: 13 mg/dL (ref 6–23)
Calcium: 8.7 mg/dL (ref 8.4–10.5)
Chloride: 101 mEq/L (ref 96–112)
Creatinine, Ser: 0.97 mg/dL (ref 0.50–1.35)
GFR calc non Af Amer: >90 mL/min (ref >90)
Glucose, Bld: 99 mg/dL (ref 70–99)
Total Bilirubin: 0.5 mg/dL (ref 0.3–1.2)
Total Protein: 6.7 g/dL (ref 6.0–8.3)

## 2013-08-10 LAB — GLUCOSE, CAPILLARY: Glucose-Capillary: 92 mg/dL (ref 70–99)

## 2013-08-10 LAB — LIPID PANEL
Cholesterol: 174 mg/dL (ref 0–200)
Total CHOL/HDL Ratio: 6.2 RATIO
Triglycerides: 242 mg/dL — ABNORMAL HIGH (ref <150)

## 2013-08-10 MED ORDER — IOHEXOL 350 MG/ML SOLN
100.0000 mL | Freq: Once | INTRAVENOUS | Status: AC | PRN
  Administered 2013-08-10: 90 mL via INTRAVENOUS

## 2013-08-10 MED ORDER — ASPIRIN 81 MG PO TBEC: 81.0000 mg | DELAYED_RELEASE_TABLET | Freq: Every day | ORAL | Status: AC

## 2013-08-10 MED ORDER — NITROGLYCERIN 0.3 MG SL SUBL
0.3000 mg | SUBLINGUAL_TABLET | Freq: Once | SUBLINGUAL | Status: AC
  Administered 2013-08-10: 0.3 mg via SUBLINGUAL
  Filled 2013-08-10: qty 100

## 2013-08-10 MED ORDER — ATORVASTATIN CALCIUM 80 MG PO TABS: 80.0000 mg | ORAL_TABLET | Freq: Every day | ORAL | Status: DC

## 2013-08-10 MED ORDER — NITROGLYCERIN 0.4 MG SL SUBL: 0.4000 mg | SUBLINGUAL_TABLET | SUBLINGUAL | Status: DC | PRN

## 2013-08-10 MED ORDER — METOPROLOL TARTRATE 1 MG/ML IV SOLN
5.0000 mg | INTRAVENOUS | Status: DC | PRN
  Administered 2013-08-10: 5 mg via INTRAVENOUS
  Filled 2013-08-10: qty 5

## 2013-08-10 MED ORDER — METFORMIN HCL 500 MG PO TABS: 500.0000 mg | ORAL_TABLET | Freq: Two times a day (BID) | ORAL | Status: DC

## 2013-08-10 NOTE — Progress Notes (Signed)
UR completed 

## 2013-08-10 NOTE — Progress Notes (Signed)
Reviewed discharge instructions with patient and he stated his understanding.  Reviewed use of SL NTG with patient.  Discharged home via wheelchair with parents.  Gabriel [name]

## 2013-08-10 NOTE — Progress Notes (Signed)
SUBJECTIVE:   Some chest pain again last night.   PHYSICAL EXAM Filed Vitals:   08/09/13 1221 08/09/13 1424 08/09/13 2100 08/10/13 0544  BP: 133/73 104/61 126/65 112/61  Pulse: 79 83 77 62  Temp: 97.6 F (36.4 C)  98.7 F (37.1 C) 98 F (36.7 C)  TempSrc: Oral  Oral Oral  Resp: 18  18 18   Height:      Weight:    297 lb (134.718 kg)  SpO2: 97%  96% 96%   General:  No acute distress Lungs:  Clear Heart:  RRR Abdomen:  Positive bowel sounds, no rebound no guarding Extremities:  No edema.   LABS: Lab Results  Component Value Date   TROPONINI <0.30 08/10/2013   Results for orders placed during the hospital encounter of 08/09/13 (from the past 24 hour(s))  GLUCOSE, CAPILLARY     Status: Abnormal   Collection Time    08/09/13 12:27 PM      Result Value Range   Glucose-Capillary 112 (*) 70 - 99 mg/dL  TSH     Status: Abnormal   Collection Time    08/09/13 12:35 PM      Result Value Range   TSH 5.670 (*) 0.350 - 4.500 uIU/mL  PROTIME-INR     Status: None   Collection Time    08/09/13 12:35 PM      Result Value Range   Prothrombin Time 12.9  11.6 - 15.2 seconds   INR 0.99  0.00 - 1.49  T4, FREE     Status: None   Collection Time    08/09/13 12:35 PM      Result Value Range   Free T4 1.42  0.80 - 1.80 ng/dL  CBC     Status: None   Collection Time    08/09/13 12:35 PM      Result Value Range   WBC 7.0  4.0 - 10.5 K/uL   RBC 4.81  4.22 - 5.81 MIL/uL   Hemoglobin 14.3  13.0 - 17.0 g/dL   HCT 45.4  09.8 - 11.9 %   MCV 86.3  78.0 - 100.0 fL   MCH 29.7  26.0 - 34.0 pg   MCHC 34.5  30.0 - 36.0 g/dL   RDW 14.7  82.9 - 56.2 %   Platelets 237  150 - 400 K/uL  CREATININE, SERUM     Status: None   Collection Time    08/09/13 12:35 PM      Result Value Range   Creatinine, Ser 0.99  0.50 - 1.35 mg/dL   GFR calc non Af Amer >90  >90 mL/min   GFR calc Af Amer >90  >90 mL/min  TROPONIN I     Status: None   Collection Time    08/09/13 12:35 PM      Result Value Range     Troponin I <0.30  <0.30 ng/mL  GLUCOSE, CAPILLARY     Status: Abnormal   Collection Time    08/09/13  4:50 PM      Result Value Range   Glucose-Capillary 110 (*) 70 - 99 mg/dL  TROPONIN I     Status: None   Collection Time    08/09/13  6:06 PM      Result Value Range   Troponin I <0.30  <0.30 ng/mL  GLUCOSE, CAPILLARY     Status: Abnormal   Collection Time    08/09/13  8:50 PM      Result Value Range  Glucose-Capillary 118 (*) 70 - 99 mg/dL  TROPONIN I     Status: None   Collection Time    08/10/13 12:03 AM      Result Value Range   Troponin I <0.30  <0.30 ng/mL  COMPREHENSIVE METABOLIC PANEL     Status: None   Collection Time    08/10/13 12:04 AM      Result Value Range   Sodium 138  135 - 145 mEq/L   Potassium 3.6  3.5 - 5.1 mEq/L   Chloride 101  96 - 112 mEq/L   CO2 26  19 - 32 mEq/L   Glucose, Bld 99  70 - 99 mg/dL   BUN 13  6 - 23 mg/dL   Creatinine, Ser 4.54  0.50 - 1.35 mg/dL   Calcium 8.7  8.4 - 09.8 mg/dL   Total Protein 6.7  6.0 - 8.3 g/dL   Albumin 3.7  3.5 - 5.2 g/dL   AST 33  0 - 37 U/L   ALT 48  0 - 53 U/L   Alkaline Phosphatase 77  39 - 117 U/L   Total Bilirubin 0.5  0.3 - 1.2 mg/dL   GFR calc non Af Amer >90  >90 mL/min   GFR calc Af Amer >90  >90 mL/min  LIPID PANEL     Status: Abnormal   Collection Time    08/10/13 12:04 AM      Result Value Range   Cholesterol 174  0 - 200 mg/dL   Triglycerides 119 (*) <150 mg/dL   HDL 28 (*) >14 mg/dL   Total CHOL/HDL Ratio 6.2     VLDL 48 (*) 0 - 40 mg/dL   LDL Cholesterol 98  0 - 99 mg/dL  GLUCOSE, CAPILLARY     Status: None   Collection Time    08/10/13  7:19 AM      Result Value Range   Glucose-Capillary 92  70 - 99 mg/dL    Intake/Output Summary (Last 24 hours) at 08/10/13 0858 Last data filed at 08/10/13 0700  Gross per 24 hour  Intake    720 ml  Output      0 ml  Net    720 ml    EKG:  Sinus rhythm, rate 62, axis within normal limits, intervals within normal limits, no acute ST-T wave  changes.  08/10/2013   ASSESSMENT AND PLAN:  CHEST PAIN:   Still with some but no objective evidence of ischemia.  Coronary CTA today.   HTN:  BP OK.  Continue current therapy.   HYPERLIPIDEMIA:  Triglycerides elevated.  LDL less than 100.  Given diabetes I will continue statin at moderate dose.    HYPOTHYROIDISM:    TSH is slightly elevated.  However, T4 OK. No change in therapy.     Rollene Rotunda 08/10/2013 8:58 AM

## 2013-08-10 NOTE — Discharge Summary (Signed)
Discharge Summary   Patient ID: Gabriel [name],  MRN: [id number], DOB/AGE: 46-Dec-1967 46 y.o.  Admit date: 08/09/2013 Discharge date: 08/10/2013  Primary Care Provider: Carrie Mew Primary Cardiologist: J. Murielle Stang, MD   Discharge Diagnoses Principal Problem:   Unstable angina  **Nonobstructive CAD by Cardiac CTA this admission.  Active Problems:   HTN (hypertension)   Diabetes mellitus   Morbid obesity   Hypothyroidism   Hyperlipidemia  Allergies No Known Allergies  Procedures  Cardiac CT Angiography 12.2.2014  FINDINGS: Non-cardiac: See separate report from [facility].  Calcium Score:  238 Agatston units  Coronary Arteries: Right dominant with no anomalies  LM:  No plaque or stenosis. LAD: There was prominent mixed plaque in the proximal LAD in the area of the D2 origin. There appeared to be up to moderate stenosis in this area. D1:  Small-moderate sized, probable mild ostial stenosis. D2:  Moderate, no significant disease. Circumflex: Very small vessel that does not appear to have significant disease. D1 and D2 appear to cover what would normally be LCx territory and there is a moderate-sized PLV off the RCA. RCA:  No plaque or stenosis.  Large vessel.  IMPRESSION: 1. Prominent mixed plaque in the proximal LAD in the area of the D2 origin. There appears to be up to moderate stenosis.  2. Age-advanced coronary artery calcification with calcium score 238 Agatston units. This places the patient in the 95th percentile for his age and gender. This suggests that he is in a high risk cohort for future cardiac events.  Gabriel [name]            Narrative        EXAM: OVER-READ INTERPRETATION  CT CHEST  IMPRESSION: 1. No acute incidental noncardiac findings to account for the patient's symptoms.   History of Present Illness  47 year old male without prior cardiac history but with multiple risk factors including hypertension,  diabetes, hyperlipidemia, obesity, history of tobacco abuse, and significant family history of CAD (father had an MI at 56) , who was his usual state he of health until the evening prior to admission, when he began to experience retrosternal chest discomfort. Symptoms persisted for 3-4 hours and then worsened when he lay down for bed at 2 AM. At that point, symptoms became associated with mild dyspnea. After 2 hours of ongoing symptoms, patient decided to drive himself to the [facility]. There, he was treated with aspirin and GI cocktail and approximate hour later became pain-free. ECG was nonacute and troponin was normal. Cardiology is asked to evaluate and subsequently admit.   Hospital Course   following admission, patient ruled out for myocardial infarction. He did have recurrence of chest discomfort during the night however was without any objective evidence of ischemia by enzymes or EKG. As result, we opted to perform cardiac CT angiography to assess his coronary anatomy. This was performed this morning and showed moderate mixed plaque in the proximal LAD with a calcium score of 238. This places him at high risk for future cardiac events and as a result, he has been placed on aspirin and statin therapy. He'll be discharged home today in good condition and we've arranged for followup in the office in approximately 2 weeks.   Discharge Vitals Blood pressure 112/61, pulse 62, temperature 98 F (36.7 C), temperature source Oral, resp. rate 18, height 5\' 8"  (1.727 m), weight 297 lb (134.718 kg), SpO2 96.00%.  Filed Weights   08/09/13 0545 08/10/13 0544  Weight: 290 lb (131.543 kg)  297 lb (134.718 kg)    Labs  CBC  Recent Labs  08/09/13 0546 08/09/13 1235  WBC 8.4 7.0  HGB 14.2 14.3  HCT 41.2 41.5  MCV 85.7 86.3  PLT 254 237   Basic Metabolic Panel  Recent Labs  08/09/13 0546 08/09/13 1235 08/10/13 0004  NA 139  --  138  K 3.6  --  3.6  CL 102  --  101  CO2 28  --  26  GLUCOSE  110*  --  99  BUN 13  --  13  CREATININE 1.06 0.99 0.97  CALCIUM 9.5  --  8.7   Liver Function Tests  Recent Labs  08/10/13 0004  AST 33  ALT 48  ALKPHOS 77  BILITOT 0.5  PROT 6.7  ALBUMIN 3.7   Cardiac Enzymes  Recent Labs  08/09/13 1235 08/09/13 1806 08/10/13 0003  TROPONINI <0.30 <0.30 <0.30   Fasting Lipid Panel  Recent Labs  08/10/13 0004  CHOL 174  HDL 28*  LDLCALC 98  TRIG 469*  CHOLHDL 6.2   Thyroid Function Tests  Recent Labs  08/09/13 1235  TSH 5.670*   Disposition  Pt is being discharged home today in good condition.  Follow-up Plans & Appointments  Follow-up Information   Follow up with Carrie Mew, MD. (as scheduled.)    Specialty:  Internal Medicine   Contact information:   [address] [place] [address] [phone]       Follow up with Tereso Newcomer, PA-C On 08/26/2013. (11:10 AM - Dr. Jenene Slicker PA.)    Specialty:  Physician Assistant   Contact information:   [address] [place] [address] [phone]      Discharge Medications    Medication List         acetaminophen 500 MG tablet  Commonly known as:  TYLENOL  Take 1,000 mg by mouth every 6 (six) hours as needed for mild pain or moderate pain.     aspirin 81 MG EC tablet  Take 1 tablet (81 mg total) by mouth daily.     atorvastatin 80 MG tablet  Commonly known as:  LIPITOR  Take 1 tablet (80 mg total) by mouth daily at 6 PM.     ibuprofen 200 MG tablet  Commonly known as:  ADVIL,MOTRIN  Take 400 mg by mouth every 6 (six) hours as needed for mild pain or moderate pain.     levothyroxine 100 MCG tablet  Commonly known as:  SYNTHROID, LEVOTHROID  Take 100 mcg by mouth daily.     losartan 100 MG tablet  Commonly known as:  COZAAR  Take 100 mg by mouth daily.     metFORMIN 500 MG tablet - to be resumed on 08/12/2013.  Commonly known as:  GLUCOPHAGE  Take 1 tablet (500 mg total) by mouth 2 (two) times daily with a  meal.      multivitamin,tx-minerals tablet  Take 1 tablet by mouth daily.     nitroGLYCERIN 0.4 MG SL tablet  Commonly known as:  NITROSTAT  Place 1 tablet (0.4 mg total) under the tongue every 5 (five) minutes as needed for chest pain (CP or SOB).       Outstanding Labs/Studies  F/u lipids/lft's in 6-8 wks (new statin).  Duration of Discharge Encounter   Greater than 30 minutes including physician time.  Signed, Nicolasa Ducking NP 08/10/2013, 2:07 PM   Patient seen and examined.  Plan as discussed in my rounding note  for today and outlined above. Gabriel [name]  08/10/2013  3:00 PM

## 2013-08-17 ENCOUNTER — Encounter (HOSPITAL_COMMUNITY): Payer: Self-pay | Admitting: Emergency Medicine

## 2013-08-17 ENCOUNTER — Emergency Department (HOSPITAL_COMMUNITY): Payer: Self-pay

## 2013-08-17 ENCOUNTER — Observation Stay (HOSPITAL_COMMUNITY)
Admission: EM | Admit: 2013-08-17 | Discharge: 2013-08-18 | Disposition: A | Discharge status: Home / Self Care | Payer: Self-pay | Attending: Cardiology | Admitting: Cardiology

## 2013-08-17 DIAGNOSIS — Z87891 Personal history of nicotine dependence: Secondary | ICD-10-CM | POA: Insufficient documentation

## 2013-08-17 DIAGNOSIS — E039 Hypothyroidism, unspecified: Secondary | ICD-10-CM | POA: Insufficient documentation

## 2013-08-17 DIAGNOSIS — I10 Essential (primary) hypertension: Secondary | ICD-10-CM | POA: Insufficient documentation

## 2013-08-17 DIAGNOSIS — E785 Hyperlipidemia, unspecified: Secondary | ICD-10-CM | POA: Insufficient documentation

## 2013-08-17 DIAGNOSIS — K219 Gastro-esophageal reflux disease without esophagitis: Secondary | ICD-10-CM | POA: Insufficient documentation

## 2013-08-17 DIAGNOSIS — I200 Unstable angina: Secondary | ICD-10-CM

## 2013-08-17 DIAGNOSIS — Z6841 Body Mass Index (BMI) 40.0 and over, adult: Secondary | ICD-10-CM | POA: Insufficient documentation

## 2013-08-17 DIAGNOSIS — E669 Obesity, unspecified: Secondary | ICD-10-CM | POA: Insufficient documentation

## 2013-08-17 DIAGNOSIS — E119 Type 2 diabetes mellitus without complications: Secondary | ICD-10-CM | POA: Diagnosis present

## 2013-08-17 DIAGNOSIS — R072 Precordial pain: Principal | ICD-10-CM | POA: Diagnosis present

## 2013-08-17 DIAGNOSIS — I2510 Atherosclerotic heart disease of native coronary artery without angina pectoris: Secondary | ICD-10-CM | POA: Insufficient documentation

## 2013-08-17 DIAGNOSIS — I208 Other forms of angina pectoris: Secondary | ICD-10-CM

## 2013-08-17 DIAGNOSIS — E1169 Type 2 diabetes mellitus with other specified complication: Secondary | ICD-10-CM | POA: Diagnosis present

## 2013-08-17 DIAGNOSIS — Z7982 Long term (current) use of aspirin: Secondary | ICD-10-CM | POA: Insufficient documentation

## 2013-08-17 HISTORY — DX: Unspecified osteoarthritis, unspecified site: M19.90

## 2013-08-17 HISTORY — DX: Type 2 diabetes mellitus without complications: E11.9

## 2013-08-17 LAB — HEPATIC FUNCTION PANEL
ALT: 46 U/L (ref 0–53)
AST: 28 U/L (ref 0–37)
Alkaline Phosphatase: 92 U/L (ref 39–117)
Bilirubin, Direct: 0.1 mg/dL (ref 0.0–0.3)
Total Bilirubin: 0.8 mg/dL (ref 0.3–1.2)
Total Protein: 7 g/dL (ref 6.0–8.3)

## 2013-08-17 LAB — PROTIME-INR
INR: 1.03 (ref 0.00–1.49)
Prothrombin Time: 13.3 seconds (ref 11.6–15.2)

## 2013-08-17 LAB — BASIC METABOLIC PANEL
BUN: 10 mg/dL (ref 6–23)
CO2: 25 mEq/L (ref 19–32)
Calcium: 9.1 mg/dL (ref 8.4–10.5)
Chloride: 100 mEq/L (ref 96–112)
Creatinine, Ser: 1 mg/dL (ref 0.50–1.35)
GFR calc non Af Amer: 89 mL/min — ABNORMAL LOW (ref >90)
Glucose, Bld: 144 mg/dL — ABNORMAL HIGH (ref 70–99)

## 2013-08-17 LAB — CBC
HCT: 41.1 % (ref 39.0–52.0)
Hemoglobin: 14.1 g/dL (ref 13.0–17.0)
MCH: 29.4 pg (ref 26.0–34.0)
MCHC: 34.3 g/dL (ref 30.0–36.0)

## 2013-08-17 LAB — PRO B NATRIURETIC PEPTIDE: Pro B Natriuretic peptide (BNP): 7 pg/mL (ref 0–125)

## 2013-08-17 LAB — GLUCOSE, CAPILLARY: Glucose-Capillary: 138 mg/dL — ABNORMAL HIGH (ref 70–99)

## 2013-08-17 LAB — HEMOGLOBIN A1C: Mean Plasma Glucose: 128 mg/dL — ABNORMAL HIGH (ref <117)

## 2013-08-17 LAB — TROPONIN I: Troponin I: <0.3 ng/mL (ref <0.30)

## 2013-08-17 LAB — APTT: aPTT: 31 seconds (ref 24–37)

## 2013-08-17 MED ORDER — ASPIRIN EC 81 MG PO TBEC
81.0000 mg | DELAYED_RELEASE_TABLET | Freq: Every day | ORAL | Status: DC
  Filled 2013-08-17: qty 1

## 2013-08-17 MED ORDER — ASPIRIN 81 MG PO CHEW
324.0000 mg | CHEWABLE_TABLET | ORAL | Status: AC
  Administered 2013-08-18: 324 mg via ORAL
  Filled 2013-08-17: qty 4

## 2013-08-17 MED ORDER — SUPER HIGH VITAMINS/MINERALS PO TABS: 1.0000 | ORAL_TABLET | Freq: Every day | ORAL | Status: DC

## 2013-08-17 MED ORDER — SODIUM CHLORIDE 0.9 % IJ SOLN: 3.0000 mL | Freq: Two times a day (BID) | INTRAMUSCULAR | Status: DC

## 2013-08-17 MED ORDER — ATORVASTATIN CALCIUM 80 MG PO TABS
80.0000 mg | ORAL_TABLET | Freq: Every day | ORAL | Status: DC
  Administered 2013-08-17 – 2013-08-18 (×2): 80 mg via ORAL
  Filled 2013-08-17 (×2): qty 1

## 2013-08-17 MED ORDER — SODIUM CHLORIDE 0.9 % IV SOLN
1.0000 mL/kg/h | INTRAVENOUS | Status: DC
  Administered 2013-08-18 (×2): 1 mL/kg/h via INTRAVENOUS

## 2013-08-17 MED ORDER — ACETAMINOPHEN 325 MG PO TABS
650.0000 mg | ORAL_TABLET | ORAL | Status: DC | PRN
  Administered 2013-08-18: 650 mg via ORAL
  Filled 2013-08-17: qty 2

## 2013-08-17 MED ORDER — ZOLPIDEM TARTRATE 5 MG PO TABS: 5.0000 mg | ORAL_TABLET | Freq: Every evening | ORAL | Status: DC | PRN

## 2013-08-17 MED ORDER — LEVOTHYROXINE SODIUM 100 MCG PO TABS
100.0000 ug | ORAL_TABLET | Freq: Every day | ORAL | Status: DC
  Administered 2013-08-18: 100 ug via ORAL
  Filled 2013-08-17 (×2): qty 1

## 2013-08-17 MED ORDER — SODIUM CHLORIDE 0.9 % IJ SOLN
3.0000 mL | Freq: Two times a day (BID) | INTRAMUSCULAR | Status: DC
  Administered 2013-08-17: 3 mL via INTRAVENOUS

## 2013-08-17 MED ORDER — ENOXAPARIN SODIUM 40 MG/0.4ML ~~LOC~~ SOLN
40.0000 mg | SUBCUTANEOUS | Status: DC
  Administered 2013-08-17: 40 mg via SUBCUTANEOUS
  Filled 2013-08-17 (×2): qty 0.4

## 2013-08-17 MED ORDER — SODIUM CHLORIDE 0.9 % IV SOLN: 250.0000 mL | INTRAVENOUS | Status: DC | PRN

## 2013-08-17 MED ORDER — SODIUM CHLORIDE 0.9 % IJ SOLN: 3.0000 mL | INTRAMUSCULAR | Status: DC | PRN

## 2013-08-17 MED ORDER — IBUPROFEN 400 MG PO TABS
400.0000 mg | ORAL_TABLET | Freq: Four times a day (QID) | ORAL | Status: DC | PRN
  Filled 2013-08-17: qty 1

## 2013-08-17 MED ORDER — PANTOPRAZOLE SODIUM 40 MG IV SOLR
40.0000 mg | INTRAVENOUS | Status: AC
  Administered 2013-08-17: 40 mg via INTRAVENOUS
  Filled 2013-08-17: qty 40

## 2013-08-17 MED ORDER — ASPIRIN 81 MG PO CHEW
CHEWABLE_TABLET | ORAL | Status: AC
  Administered 2013-08-17: 324 mg
  Filled 2013-08-17: qty 4

## 2013-08-17 MED ORDER — LOSARTAN POTASSIUM 50 MG PO TABS
100.0000 mg | ORAL_TABLET | Freq: Every day | ORAL | Status: DC
  Administered 2013-08-18: 100 mg via ORAL
  Filled 2013-08-17: qty 2

## 2013-08-17 MED ORDER — INSULIN ASPART 100 UNIT/ML ~~LOC~~ SOLN: 0.0000 [IU] | Freq: Every day | SUBCUTANEOUS | Status: DC

## 2013-08-17 MED ORDER — ALPRAZOLAM 0.25 MG PO TABS: 0.2500 mg | ORAL_TABLET | Freq: Two times a day (BID) | ORAL | Status: DC | PRN

## 2013-08-17 MED ORDER — NITROGLYCERIN 0.4 MG SL SUBL: 0.4000 mg | SUBLINGUAL_TABLET | SUBLINGUAL | Status: DC | PRN

## 2013-08-17 MED ORDER — INSULIN ASPART 100 UNIT/ML ~~LOC~~ SOLN: 0.0000 [IU] | Freq: Three times a day (TID) | SUBCUTANEOUS | Status: DC

## 2013-08-17 MED ORDER — ACETAMINOPHEN 500 MG PO TABS: 1000.0000 mg | ORAL_TABLET | Freq: Four times a day (QID) | ORAL | Status: DC | PRN

## 2013-08-17 MED ORDER — ONDANSETRON HCL 4 MG/2ML IJ SOLN: 4.0000 mg | Freq: Four times a day (QID) | INTRAMUSCULAR | Status: DC | PRN

## 2013-08-17 MED ORDER — ASPIRIN 325 MG PO TABS: 325.0000 mg | ORAL_TABLET | ORAL | Status: DC

## 2013-08-17 MED ORDER — ADULT MULTIVITAMIN W/MINERALS CH
1.0000 | ORAL_TABLET | Freq: Every day | ORAL | Status: DC
  Administered 2013-08-18: 1 via ORAL
  Filled 2013-08-17: qty 1

## 2013-08-17 MED ORDER — DIAZEPAM 5 MG PO TABS
5.0000 mg | ORAL_TABLET | ORAL | Status: AC
  Administered 2013-08-18: 5 mg via ORAL
  Filled 2013-08-17: qty 1

## 2013-08-17 NOTE — ED Provider Notes (Signed)
Medical screening examination/treatment/procedure(s) were conducted as a shared visit with non-physician practitioner(s) and myself.  I personally evaluated the patient during the encounter.  EKG Interpretation    Date/Time:  Tuesday August 17 2013 08:05:30 EST Ventricular Rate:  87 PR Interval:  150 QRS Duration: 90 QT Interval:  370 QTC Calculation: 445 R Axis:   -29 Text Interpretation:  Normal sinus rhythm Nonspecific ST abnormality Abnormal ECG No acute changes  Confirmed by Rhunette Croft, MD, Delisia Mcquiston ([id number]) on 08/17/2013 12:13:28 PM           PT comes in with cc of chest pain. Recent chest pain workup, with moderate CAD on CT coronary. Pain is typical, currently pain free. [facility] to admit for full ACS workup.  Derwood Kaplan, MD 08/17/13 1556

## 2013-08-17 NOTE — ED Notes (Signed)
Pt c/o mid sternal CP with radiation to left arm starting this am; pt sts hx of similar last week and had clean coronary CT; pt sts SOB with laying flat

## 2013-08-17 NOTE — ED Provider Notes (Signed)
CSN: [id number]     Arrival date & time 08/17/13  0804 History   First MD Initiated Contact with Patient 08/17/13 0831     Chief Complaint  Patient presents with  . Chest Pain   (Consider location/radiation/quality/duration/timing/severity/associated sxs/prior Treatment) HPI Comments: Patient is a 47 year old male with a past medical history of hypertension, hyperlipidemia, diabetes, GERD, and obesity who presents with chest pain that started this morning. He reports being at rest and having central chest pain that radiated to his left arm. The pain was a severe pressure that lasted 5-10 minutes and spontaneously resolved. He reports associated SOB. He denies any current symptoms except for SOB when he lies flat. Patient was seen in the ED last week and admitted for chest pain. He was seen by [facility] and had a clean coronary CT.    Past Medical History  Diagnosis Date  . Hypertension     a. Dx 3y ago.  [name] Hyperlipidemia     a. Dx 3y ago - not on statin.  [name] GERD (gastroesophageal reflux disease)   . Hypothyroidism     a. on replacement.  . Obesity   . Snoring   . Diabetes mellitus without complication     TYPE 2  . Chest pain     a. 08/2013 Cardia CTA: Ca score 238 (95%), LM nl, LAD mod stenosis in D2 area, D1 mild ost stenosis, D2 no signif dzs, LCX small, RCA nl.   Past Surgical History  Procedure Laterality Date  . Rotator cuff repair    . Cholecystectomy    . Knee surgery    . Shoulder surgery    . Ankle surgery     Family History  Problem Relation Age of Onset  . Adopted: Yes  . Coronary artery disease Father     s/p MI in his early 30's->CABG x 5 @ age 79, currently 38.  . Diabetes Father   . Stroke Father   . Hypertension Father   . Hypertension Mother   . Obesity Mother   . Diabetes Mother   . Diabetes Brother   . Obesity Brother    History  Substance Use Topics  . Smoking status: Former Smoker -- 1.50 packs/day for 24 years    Quit date:  09/10/2007  . Smokeless tobacco: Never Used  . Alcohol Use: No    Review of Systems  Constitutional: Negative for fever, chills and fatigue.  HENT: Negative for trouble swallowing.   Eyes: Negative for visual disturbance.  Respiratory: Positive for shortness of breath.   Cardiovascular: Positive for chest pain. Negative for palpitations.  Gastrointestinal: Negative for nausea, vomiting, abdominal pain and diarrhea.  Genitourinary: Negative for dysuria and difficulty urinating.  Musculoskeletal: Negative for arthralgias and neck pain.  Skin: Negative for color change.  Neurological: Negative for dizziness and weakness.  Psychiatric/Behavioral: Negative for dysphoric mood.    Allergies  Review of patient's allergies indicates no known allergies.  Home Medications   Current Outpatient Rx  Name  Route  Sig  Dispense  Refill  . acetaminophen (TYLENOL) 500 MG tablet   Oral   Take 1,000 mg by mouth every 6 (six) hours as needed for mild pain or moderate pain.         [name] aspirin EC 81 MG EC tablet   Oral   Take 1 tablet (81 mg total) by mouth daily.         [name] atorvastatin (LIPITOR) 80 MG tablet   Oral  Take 1 tablet (80 mg total) by mouth daily at 6 PM.   30 tablet   6   . ibuprofen (ADVIL,MOTRIN) 200 MG tablet   Oral   Take 400 mg by mouth every 6 (six) hours as needed for mild pain or moderate pain.         [name] levothyroxine (SYNTHROID, LEVOTHROID) 100 MCG tablet   Oral   Take 100 mcg by mouth daily.         [name] losartan (COZAAR) 100 MG tablet   Oral   Take 100 mg by mouth daily.         . metFORMIN (GLUCOPHAGE) 500 MG tablet   Oral   Take 1 tablet (500 mg total) by mouth 2 (two) times daily with a meal. Hold metformin x 48 hours after receiving IV contrast for CT a/p on 10/19/2012. BWagner, RT,-(R),(CT)           Resume on 08/12/2013.   . Multiple Vitamins-Minerals (MULTIVITAMIN,TX-MINERALS) tablet   Oral   Take 1 tablet by mouth daily.           .  nitroGLYCERIN (NITROSTAT) 0.4 MG SL tablet   Sublingual   Place 1 tablet (0.4 mg total) under the tongue every 5 (five) minutes as needed for chest pain (CP or SOB).   25 tablet   3    BP 148/84  Pulse 86  Temp(Src) 97.9 F (36.6 C) (Oral)  Resp 24  Ht 5\' 9"  (1.753 m)  Wt 290 lb (131.543 kg)  BMI 42.81 kg/m2  SpO2 95% Physical Exam  Nursing note and vitals reviewed. Constitutional: He is oriented to person, place, and time. He appears well-developed and well-nourished. No distress.  HENT:  Head: Normocephalic and atraumatic.  Eyes: Conjunctivae and EOM are normal. Pupils are equal, round, and reactive to light.  Neck: Normal range of motion.  Cardiovascular: Normal rate and regular rhythm.  Exam reveals no gallop and no friction rub.   No murmur heard. Pulmonary/Chest: Effort normal and breath sounds normal. He has no wheezes. He has no rales. He exhibits no tenderness.  Abdominal: Soft. He exhibits no distension. There is tenderness. There is no rebound and no guarding.  Mild epigastric tenderness to palpation.   Musculoskeletal: Normal range of motion.  Neurological: He is alert and oriented to person, place, and time. Coordination normal.  Speech is goal-oriented. Moves limbs without ataxia.   Skin: Skin is warm and dry.  Psychiatric: He has a normal mood and affect. His behavior is normal.    ED Course  Procedures (including critical care time) Labs Review Labs Reviewed  BASIC METABOLIC PANEL - Abnormal; Notable for the following:    Glucose, Bld 144 (*)    GFR calc non Af Amer 89 (*)    All other components within normal limits  CBC  PRO B NATRIURETIC PEPTIDE  HEPATIC FUNCTION PANEL  LIPASE, BLOOD  TROPONIN I  TROPONIN I  TROPONIN I  PROTIME-INR  APTT  HEMOGLOBIN A1C  POCT I-STAT TROPONIN I   Imaging Review Dg Chest 2 View  08/17/2013   CLINICAL DATA:  Chest pain.  EXAM: CHEST  2 VIEW  COMPARISON:  Chest x-ray of August 09, 2013.  FINDINGS: The lungs are  adequately inflated. There is no focal infiltrate. There is no pleural effusion or pneumothorax. The cardiopericardial silhouette is normal in size. The pulmonary vascularity is not engorged. The mediastinum is normal in width. There is no pleural effusion.  IMPRESSION: There is  no evidence of pneumonia nor CHF nor other acute cardiopulmonary abnormality.   Electronically Signed   By: David  [place]   On: 08/17/2013 11:03    EKG Interpretation    Date/Time:  Tuesday August 17 2013 08:05:30 EST Ventricular Rate:  87 PR Interval:  150 QRS Duration: 90 QT Interval:  370 QTC Calculation: 445 R Axis:   -29 Text Interpretation:  Normal sinus rhythm Nonspecific ST abnormality Abnormal ECG No acute changes  Confirmed by NANAVATI, MD, ANKIT ([id number]) on 08/17/2013 12:13:28 PM            MDM   1. Stable angina     9:01 AM Labs, troponin, and chest xray pending. Vitals stable and patient afebrile. Patient reports some SOB currently, otherwise asymptomatic.   [facility] will see the patient.    Emilia Beck, PA-C 08/17/13 1535

## 2013-08-17 NOTE — H&P (Signed)
History and Physical   Patient ID: Gabriel [name] MRN: [id number], DOB/AGE: [birth year] 47 y.o. Date of Encounter: 08/17/2013  Primary Physician: Carrie Mew, MD Primary Cardiologist: [facility]  Chief Complaint:  Chest pain  HPI: Gabriel [name] is a 47 y.o. male with a recent history of chest pain and moderate CAD by cardiac CT. He was discharged 12/2.   He went to bed at 2 am. He was lying on his back, in bed today at 4 am (usually sleeps on his side). He developed cold feeling in his feet, no numbness. Then developed dull chest pain, a 2 or 3/10. He did not have associated nausea, vomiting or diaphoresis. He felt a little short of breath.   He was still lying in bed when the pain began radiating to his left elbow. He also developed pain in his left leg below his kneecap. He decided to get up. After he got up and started moving around, the cold feeling in both feet any shortness of breath resolved. The chest pain continued. The elbow pain resolved and the left leg pain resolved. He ate a bowl of cereal and developed numbness in the lateral left hand. He came to the emergency room. He does not appear to be in acute distress.    Past Medical History  Diagnosis Date  . Hypertension     a. Dx 3y ago.  [name] Hyperlipidemia     a. Dx 3y ago - not on statin.  [name] GERD (gastroesophageal reflux disease)   . Hypothyroidism     a. on replacement.  . Obesity   . Snoring   . Diabetes mellitus without complication     TYPE 2  . Chest pain     a. 08/2013 Cardia CTA: Ca score 238 (95%), LM nl, LAD mod stenosis in D2 area, D1 mild ost stenosis, D2 no signif dzs, LCX small, RCA nl.    Surgical History:  Past Surgical History  Procedure Laterality Date  . Rotator cuff repair    . Cholecystectomy    . Knee surgery    . Shoulder surgery    . Ankle surgery       I have reviewed the patient's current medications. Prior to Admission medications   Medication Sig Start Date End Date  Taking? Authorizing Provider  acetaminophen (TYLENOL) 500 MG tablet Take 1,000 mg by mouth every 6 (six) hours as needed for mild pain or moderate pain.   Yes Historical Provider, MD  aspirin EC 81 MG EC tablet Take 1 tablet (81 mg total) by mouth daily. 08/10/13  Yes Ok Anis, NP  atorvastatin (LIPITOR) 80 MG tablet Take 1 tablet (80 mg total) by mouth daily at 6 PM. 08/10/13  Yes Ok Anis, NP  ibuprofen (ADVIL,MOTRIN) 200 MG tablet Take 400 mg by mouth every 6 (six) hours as needed for mild pain or moderate pain.   Yes Historical Provider, MD  levothyroxine (SYNTHROID, LEVOTHROID) 100 MCG tablet Take 100 mcg by mouth daily.   Yes Historical Provider, MD  losartan (COZAAR) 100 MG tablet Take 100 mg by mouth daily.   Yes Historical Provider, MD  metFORMIN (GLUCOPHAGE) 500 MG tablet Take 1 tablet (500 mg total) by mouth 2 (two) times daily with a meal. Hold metformin x 48 hours after receiving IV contrast for CT a/p on 10/19/2012. BWagner, RT,-(R),(CT) 08/10/13  Yes Ok Anis, NP  Multiple Vitamins-Minerals (MULTIVITAMIN,TX-MINERALS) tablet Take 1 tablet by mouth daily.  Yes Historical Provider, MD  nitroGLYCERIN (NITROSTAT) 0.4 MG SL tablet Place 1 tablet (0.4 mg total) under the tongue every 5 (five) minutes as needed for chest pain (CP or SOB). 08/10/13  Yes Ok Anis, NP   Scheduled Meds: . aspirin  325 mg Oral STAT   Continuous Infusions:  PRN Meds:.  Allergies: No Known Allergies  History   Social History  . Marital Status: Single    Spouse Name: N/A    Number of Children: N/A  . Years of Education: N/A   Occupational History  . Unemployed    Social History Main Topics  . Smoking status: Former Smoker -- 1.50 packs/day for 24 years    Quit date: 09/10/2007  . Smokeless tobacco: Never Used  . Alcohol Use: No  . Drug Use: No  . Sexual Activity: Not on file   Other Topics Concern  . Not on file   Social History Narrative   Lives in  [place] with parents.  Unemployed.  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    Family History  Problem Relation Age of Onset  . Adopted: Yes  . Coronary artery disease Father     s/p MI in his early 30's->CABG x 5 @ age 53, currently 74.  . Diabetes Father   . Stroke Father   . Hypertension Father   . Hypertension Mother   . Obesity Mother   . Diabetes Mother   . Diabetes Brother   . Obesity Brother    Family Status  Relation Status Death Age  . Mother Alive     Hx CAD  . Father Alive     No Hx CAD    Review of Systems:   Full 14-point review of systems otherwise negative except as noted above.  Physical Exam: Blood pressure 118/62, pulse 69, temperature 97.9 F (36.6 C), temperature source Oral, resp. rate 15, height 5\' 9"  (1.753 m), weight 290 lb (131.543 kg), SpO2 98.00%. General: Well developed, well nourished,male in no acute distress. Head: Normocephalic, atraumatic, sclera non-icteric, no xanthomas, nares are without discharge. Dentition: Good Neck: No carotid bruits. JVD not elevated. No thyromegally Lungs: Good expansion bilaterally. without wheezes or rhonchi.  Heart: Regular rate and rhythm with S1 S2.  No S3 or S4.  No murmur, no rubs, or gallops appreciated. Abdomen: Soft, non-tender, non-distended with normoactive bowel sounds. No hepatomegaly. No rebound/guarding. No obvious abdominal masses. Msk:  Strength and tone appear normal for age. No joint deformities or effusions, no spine or costo-vertebral angle tenderness. Extremities: No clubbing or cyanosis. No edema.  Distal pedal pulses are 2+ in 4 extrem Neuro: Alert and oriented X 3. Moves all extremities spontaneously. No focal deficits noted. Psych:  Responds to questions appropriately with a normal affect. Skin: No rashes or lesions noted  Labs:   Lab Results  Component Value Date   WBC 8.6 08/17/2013   HGB 14.1 08/17/2013   HCT 41.1 08/17/2013   MCV 85.6 08/17/2013   PLT 243 08/17/2013     Recent  Labs Lab 08/17/13 0850  NA 137  K 3.5  CL 100  CO2 25  BUN 10  CREATININE 1.00  CALCIUM 9.1  PROT 7.0  BILITOT 0.8  ALKPHOS 92  ALT 46  AST 28  GLUCOSE 144*    Recent Labs  08/17/13 0853  TROPIPOC 0.00   Lab Results  Component Value Date   CHOL 174 08/10/2013   HDL 28* 08/10/2013   LDLCALC 98 08/10/2013  TRIG 242* 08/10/2013   Pro B Natriuretic peptide (BNP)  Date/Time Value Range Status  08/17/2013  8:57 AM 7.0  0 - 125 pg/mL Final   Radiology/Studies: Dg Chest 2 View 08/17/2013   CLINICAL DATA:  Chest pain.  EXAM: CHEST  2 VIEW  COMPARISON:  Chest x-ray of August 09, 2013.  FINDINGS: The lungs are adequately inflated. There is no focal infiltrate. There is no pleural effusion or pneumothorax. The cardiopericardial silhouette is normal in size. The pulmonary vascularity is not engorged. The mediastinum is normal in width. There is no pleural effusion.  IMPRESSION: There is no evidence of pneumonia nor CHF nor other acute cardiopulmonary abnormality.   Electronically Signed   By: David  [place]   On: 08/17/2013 11:03   Ct Coronary Morp W/cta Cor W/score W/ca W/cm &/or Wo/cm 08/10/2013   ADDENDUM REPORT: 08/10/2013 12:31  CLINICAL DATA:  Chest pain  EXAM: Cardiac CTA  MEDICATIONS: Sub lingual nitro. 4mg  and lopressor 5mg  IV  TECHNIQUE: The patient was scanned on a Philips 256 slice scanner. Gantry rotation speed was 270 msecs. Collimation was .9mm. A 120 kV prospective scan was triggered in the descending thoracic aorta at 111 HU's with 5% padding centered around 78% of the R-R interval. Average HR during the scan was 64 bpm. The 3D data set was interpreted on a dedicated work station using MPR, MIP and VRT modes. A total of 80cc of contrast was used.  FINDINGS: Non-cardiac: See separate report from [facility].  Calcium Score:  238 Agatston units  Coronary Arteries: Right dominant with no anomalies  LM:  No plaque or stenosis.  LAD: There was prominent mixed plaque in the  proximal LAD in the area of the D2 origin. There appeared to be up to moderate stenosis in this area.  D1:  Small-moderate sized, probable mild ostial stenosis.  D2:  Moderate, no significant disease.  Circumflex: Very small vessel that does not appear to have significant disease. D1 and D2 appear to cover what would normally be LCx territory and there is a moderate-sized PLV off the RCA.  RCA:  No plaque or stenosis.  Large vessel.  IMPRESSION: 1. Prominent mixed plaque in the proximal LAD in the area of the D2 origin. There appears to be up to moderate stenosis.  2. Age-advanced coronary artery calcification with calcium score 238 Agatston units. This places the patient in the 95th percentile for his age and gender. This suggests that he is in a high risk cohort for future cardiac events.  Euline Kimbler   Electronically Signed   By: Marca Ancona M.D.   On: 08/10/2013 12:31   08/10/2013   EXAM: OVER-READ INTERPRETATION  CT CHEST  The following report is an over-read performed by radiologist Dr. Royal Piedra [facility] on 08/10/2013. This over-read does not include interpretation of cardiac or coronary anatomy or pathology. The interpretation by the cardiologist is attached.  COMPARISON:  CT of the chest abdomen and pelvis 12/09/2010.  FINDINGS: Within the visualized portions of the thorax there are no suspicious appearing pulmonary nodules or masses, there are no pleural effusions, and there is no consolidative airspace disease. No filling defects are noted within the visualized portions of the pulmonary arteries to suggest pulmonary embolism. Visualized portions of the upper abdomen are unremarkable. There are no aggressive appearing lytic or blastic lesions noted in the visualized portions of the skeleton.  IMPRESSION: 1. No acute incidental noncardiac findings to account for the patient's symptoms.  Electronically  Signed: By: Trudie Reed M.D. On: 08/10/2013 12:18   ECG:   08/17/2013 SR Vent. rate 87 BPM PR interval 150 ms QRS duration 90 ms QT/QTc 370/445 ms P-R-T axes 53 -29 51  ASSESSMENT AND PLAN:  Principal Problem:   Precordial pain - his symptoms are atypical but he had plaque on his recent cardiac CT and an elevated calcium score. He also has a strong family history of premature coronary artery disease. We will admit him and rule out MI. Cardiac catheterization is indicated for definitive answer, and we will schedule this for a.m.  Otherwise, continue home medications. Active Problems:   Hyperlipidemia   Diabetes mellitus   Signed, Theodore Demark, PA-C 08/17/2013 12:50 PM Beeper [phone]  Patient seen with PA, agree with the above note.    Patient woke up with chest pain last night.  It radiated to his left arm.  It has now resolved.  He was in the hospital < 2 wks ago and had coronary CT angiogram with significant plaque and possible moderate stenosis in the proximal LAD.  Pain was thought to be noncardiac so he was sent home for medical management on statin and ASA.    At this point, I think we need to put the question of severity of his coronary disease to rest.  Will plan for [facility] tomorrow for possible unstable angina.  Will cycle cardiac enzymes.    Marca Ancona 08/17/2013 3:51 PM

## 2013-08-18 ENCOUNTER — Encounter (HOSPITAL_COMMUNITY)
Admission: EM | Disposition: A | Discharge status: Home / Self Care | Payer: Self-pay | Source: Home / Self Care | Attending: Emergency Medicine

## 2013-08-18 DIAGNOSIS — I2510 Atherosclerotic heart disease of native coronary artery without angina pectoris: Secondary | ICD-10-CM

## 2013-08-18 HISTORY — PX: LEFT HEART CATHETERIZATION WITH CORONARY ANGIOGRAM: SHX5451

## 2013-08-18 LAB — COMPREHENSIVE METABOLIC PANEL
ALT: 42 U/L (ref 0–53)
Albumin: 3.7 g/dL (ref 3.5–5.2)
Alkaline Phosphatase: 90 U/L (ref 39–117)
CO2: 25 mEq/L (ref 19–32)
GFR calc Af Amer: >90 mL/min (ref >90)
GFR calc non Af Amer: 89 mL/min — ABNORMAL LOW (ref >90)
Glucose, Bld: 92 mg/dL (ref 70–99)
Potassium: 3.7 mEq/L (ref 3.5–5.1)
Sodium: 139 mEq/L (ref 135–145)
Total Bilirubin: 0.7 mg/dL (ref 0.3–1.2)

## 2013-08-18 LAB — GLUCOSE, CAPILLARY
Glucose-Capillary: 92 mg/dL (ref 70–99)
Glucose-Capillary: 115 mg/dL — ABNORMAL HIGH (ref 70–99)

## 2013-08-18 SURGERY — LEFT HEART CATHETERIZATION WITH CORONARY ANGIOGRAM
Anesthesia: LOCAL

## 2013-08-18 MED ORDER — LIDOCAINE HCL (PF) 1 % IJ SOLN
INTRAMUSCULAR | Status: AC
  Filled 2013-08-18: qty 30

## 2013-08-18 MED ORDER — NITROGLYCERIN 0.2 MG/ML ON CALL CATH LAB
INTRAVENOUS | Status: AC
  Filled 2013-08-18: qty 1

## 2013-08-18 MED ORDER — ADENOSINE (DIAGNOSTIC) 3 MG/ML IV SOLN
20.0000 mL | Freq: Once | INTRAVENOUS | Status: DC
  Filled 2013-08-18: qty 20

## 2013-08-18 MED ORDER — FENTANYL CITRATE 0.05 MG/ML IJ SOLN
INTRAMUSCULAR | Status: AC
  Filled 2013-08-18: qty 2

## 2013-08-18 MED ORDER — METFORMIN HCL 500 MG PO TABS: 500.0000 mg | ORAL_TABLET | Freq: Two times a day (BID) | ORAL | Status: DC

## 2013-08-18 MED ORDER — MIDAZOLAM HCL 2 MG/2ML IJ SOLN
INTRAMUSCULAR | Status: AC
  Filled 2013-08-18: qty 2

## 2013-08-18 MED ORDER — SODIUM CHLORIDE 0.9 % IV SOLN: 1.0000 mL/kg/h | INTRAVENOUS | Status: DC

## 2013-08-18 MED ORDER — HEPARIN (PORCINE) IN NACL 2-0.9 UNIT/ML-% IJ SOLN
INTRAMUSCULAR | Status: AC
  Filled 2013-08-18: qty 1500

## 2013-08-18 NOTE — CV Procedure (Signed)
    Cardiac Catheterization Procedure Note  Name: Gabriel [name] MRN: [id number] DOB: [birth year]  Procedure: Left Heart Cath, Selective Coronary Angiography, LV angiography, flow wire analysis of the LAD  Indication: 47 yo WM with multiple cardiac risk factors presents with symptoms of chest pain. Cardiac CTA indicates a significant stenosis in the proximal LAD   Procedural Details: The right wrist was prepped, draped, and anesthetized with 1% lidocaine. Using the modified Seldinger technique, a 5 French sheath was introduced into the right radial artery. 3 mg of verapamil was administered through the sheath, weight-based unfractionated heparin was administered intravenously. Standard Judkins catheters were used for selective coronary angiography and left ventriculography. Catheter exchanges were performed over an exchange length guidewire. There were no immediate procedural complications. A TR band was used for radial hemostasis at the completion of the procedure.  The patient was transferred to the post catheterization recovery area for further monitoring.  Procedural Findings: Hemodynamics: AO 122/81 mean 103 mm Hg LV 118/16 mm Hg  Coronary angiography: Coronary dominance: right  Left mainstem: Normal  Left anterior descending (LAD): Focal, eccentric 50% proximal LAD at take off of SP1.  Left circumflex (LCx): Small, normal.  Right coronary artery (RCA): large dominant, normal.  Left ventriculography: Left ventricular systolic function is normal, LVEF is estimated at 55-65%, there is no significant mitral regurgitation   Given the uncertainty of the significance of the LAD lesion we proceeded with flow wire analysis of this vessel. Once a therapeutic ACT was obtained with IV heparin I used a FL3.5 guide to access the vessel. A flow wire was used to cross the lesion. IV adenosine was used to achieve maximal hyperemia. FFR was 0.88.  Final Conclusions:   1. Borderline proximal  LAD stenosis with normal FFR. No other significant disease. 2. Normal LV function.  Recommendations: risk factor modification.  Theron Arista [facility] 08/18/2013, 1:45 PM

## 2013-08-18 NOTE — Interval H&P Note (Signed)
History and Physical Interval Note:  08/18/2013 12:41 PM  Gabriel [name]  has presented today for surgery, with the diagnosis of Chest pain  The various methods of treatment have been discussed with the patient and family. After consideration of risks, benefits and other options for treatment, the patient has consented to  Procedure(s): LEFT HEART CATHETERIZATION WITH CORONARY ANGIOGRAM (N/A) as a surgical intervention .  The patient's history has been reviewed, patient examined, no change in status, stable for surgery.  I have reviewed the patient's chart and labs.  Questions were answered to the patient's satisfaction.   Cath Lab Visit (complete for each Cath Lab visit)  Clinical Evaluation Leading to the Procedure:   ACS: yes  Non-ACS:    Anginal Classification: CCS III  Anti-ischemic medical therapy: Minimal Therapy (1 class of medications)  Non-Invasive Test Results: No non-invasive testing performed  Prior CABG: No previous CABG        Theron Arista [facility] 08/18/2013 12:42 PM

## 2013-08-18 NOTE — Progress Notes (Signed)
Right radial TR band removed, 2x2 applied with tegaderm.  No bruising or hematoma noted.  Will continue to monitor.  Colman Cater

## 2013-08-18 NOTE — Discharge Summary (Signed)
CARDIOLOGY DISCHARGE SUMMARY   Patient ID: Gabriel [name] MRN: [id number] DOB/AGE: Dec 19, 1965 47 y.o.  Admit date: 08/17/2013 Discharge date: 08/19/2013  PCP: Carrie Mew, MD Primary Cardiologist:   Primary Discharge Diagnosis:     Precordial pain Secondary Discharge Diagnosis:    Hyperlipidemia   Diabetes mellitus  Procedures: Left Heart Cath, Selective Coronary Angiography, LV angiography, flow wire analysis of the LAD   Hospital Course: Gabriel [name] is a 47 y.o. male with a history of non-obstructive CAD by recent cardiac CT. He developed chest pain and came to the ER. He was admitted for further evaluation and treatment.  His cardiac enzymes were negative for MI. His other labs were reviewed and showed no critical abnormalities. His pain had been prolonged and he has multiple cardiac risk factors as well as (at least) moderate CAD by cardiac CT. Cardiac catheterization was recommended and performed on 08/18/2013.  Results are below. He had no significant disease in the CFX and RCA. The LAD was noted (again) to have moderate disease and the FFR showed very little impingement of flow at 0.88. No stent needed. Post-cath, Gabriel [name] was ambulating without chest pain or SOB and considered stable for discharge, to follow up as an outpatient.  Labs:   Lab Results  Component Value Date   WBC 8.6 08/17/2013   HGB 14.1 08/17/2013   HCT 41.1 08/17/2013   MCV 85.6 08/17/2013   PLT 243 08/17/2013     Recent Labs Lab 08/18/13 0230  NA 139  K 3.7  CL 101  CO2 25  BUN 10  CREATININE 1.00  CALCIUM 8.8  PROT 6.9  BILITOT 0.7  ALKPHOS 90  ALT 42  AST 24  GLUCOSE 92    Recent Labs  08/17/13 1510 08/17/13 2000 08/18/13 0230  TROPONINI <0.30 <0.30 <0.30   Pro B Natriuretic peptide (BNP)  Date/Time Value Range Status  08/17/2013  8:57 AM 7.0  0 - 125 pg/mL Final    Recent Labs  08/17/13 1510  INR 1.03      Radiology: Dg Chest 2 View 08/17/2013    CLINICAL DATA:  Chest pain.  EXAM: CHEST  2 VIEW  COMPARISON:  Chest x-ray of August 09, 2013.  FINDINGS: The lungs are adequately inflated. There is no focal infiltrate. There is no pleural effusion or pneumothorax. The cardiopericardial silhouette is normal in size. The pulmonary vascularity is not engorged. The mediastinum is normal in width. There is no pleural effusion.  IMPRESSION: There is no evidence of pneumonia nor CHF nor other acute cardiopulmonary abnormality.   Electronically Signed   By: David  [place]   On: 08/17/2013 11:03    Cardiac Cath: 08/18/2013 Left mainstem: Normal  Left anterior descending (LAD): Focal, eccentric 50% proximal LAD at take off of SP1.  Left circumflex (LCx): Small, normal.  Right coronary artery (RCA): large dominant, normal.  Left ventriculography: Left ventricular systolic function is normal, LVEF is estimated at 55-65%, there is no significant mitral regurgitation  Given the uncertainty of the significance of the LAD lesion we proceeded with flow wire analysis of this vessel. Once a therapeutic ACT was obtained with IV heparin I used a FL3.5 guide to access the vessel. A flow wire was used to cross the lesion. IV adenosine was used to achieve maximal hyperemia. FFR was 0.88.  Final Conclusions:  1. Borderline proximal LAD stenosis with normal FFR. No other significant disease.  2. Normal LV function.  EKG: SR, no acute  changes  FOLLOW UP PLANS AND APPOINTMENTS No Known Allergies   Medication List         acetaminophen 500 MG tablet  Commonly known as:  TYLENOL  Take 1,000 mg by mouth every 6 (six) hours as needed for mild pain or moderate pain.     aspirin 81 MG EC tablet  Take 1 tablet (81 mg total) by mouth daily.     atorvastatin 80 MG tablet  Commonly known as:  LIPITOR  Take 1 tablet (80 mg total) by mouth daily at 6 PM.     ibuprofen 200 MG tablet  Commonly known as:  ADVIL,MOTRIN  Take 400 mg by mouth every 6 (six) hours as needed  for mild pain or moderate pain.     levothyroxine 100 MCG tablet  Commonly known as:  SYNTHROID, LEVOTHROID  Take 100 mcg by mouth daily.     losartan 100 MG tablet  Commonly known as:  COZAAR  Take 100 mg by mouth daily.     metFORMIN 500 MG tablet  Commonly known as:  GLUCOPHAGE  Take 1 tablet (500 mg total) by mouth 2 (two) times daily with a meal. HOLD metformin x 48 hours after receiving IV contrast for cardiac cath. Restart on 08/21/2013     multivitamin,tx-minerals tablet  Take 1 tablet by mouth daily.     nitroGLYCERIN 0.4 MG SL tablet  Commonly known as:  NITROSTAT  Place 1 tablet (0.4 mg total) under the tongue every 5 (five) minutes as needed for chest pain (CP or SOB).        Discharge Orders   Future Appointments Provider Department Dept Phone   08/26/2013 11:10 AM Beatrice Lecher, PA-C [facility] Office [phone]   Future Orders Complete By Expires   Diet - low sodium heart healthy  As directed    Diet Carb Modified  As directed    Increase activity slowly  As directed        BRING ALL MEDICATIONS WITH YOU TO FOLLOW UP APPOINTMENTS  Time spent with patient to include physician time: 35 min Signed: Theodore Demark, PA-C 08/19/2013, 10:54 AM Co-Sign MD

## 2013-08-19 NOTE — Progress Notes (Signed)
Reviewed discharge instructions with patient and he stated his understanding.  Radial site care reviewed also.  Discharged home with his parents.  Colman Cater

## 2013-08-19 NOTE — Discharge Summary (Signed)
Patient seen and examined and history reviewed. Agree with above findings and plan. See earlier cath note.   Gabriel [name] 08/19/2013 11:37 AM

## 2013-08-26 ENCOUNTER — Encounter: Payer: Self-pay | Admitting: Physician Assistant

## 2013-08-26 ENCOUNTER — Ambulatory Visit (INDEPENDENT_AMBULATORY_CARE_PROVIDER_SITE_OTHER): Payer: Self-pay | Admitting: Physician Assistant

## 2013-08-26 VITALS — BP 118/68 | HR 86 | Ht 68.5 in | Wt 294.4 lb

## 2013-08-26 DIAGNOSIS — E785 Hyperlipidemia, unspecified: Secondary | ICD-10-CM

## 2013-08-26 DIAGNOSIS — R079 Chest pain, unspecified: Secondary | ICD-10-CM

## 2013-08-26 DIAGNOSIS — R0683 Snoring: Secondary | ICD-10-CM

## 2013-08-26 DIAGNOSIS — I2510 Atherosclerotic heart disease of native coronary artery without angina pectoris: Secondary | ICD-10-CM | POA: Insufficient documentation

## 2013-08-26 DIAGNOSIS — I10 Essential (primary) hypertension: Secondary | ICD-10-CM

## 2013-08-26 DIAGNOSIS — E119 Type 2 diabetes mellitus without complications: Secondary | ICD-10-CM

## 2013-08-26 DIAGNOSIS — K219 Gastro-esophageal reflux disease without esophagitis: Secondary | ICD-10-CM

## 2013-08-26 DIAGNOSIS — R0609 Other forms of dyspnea: Secondary | ICD-10-CM

## 2013-08-26 MED ORDER — PANTOPRAZOLE SODIUM 40 MG PO TBEC: 40.0000 mg | DELAYED_RELEASE_TABLET | Freq: Every day | ORAL | Status: DC

## 2013-08-26 MED ORDER — SIMVASTATIN 20 MG PO TABS: 20.0000 mg | ORAL_TABLET | Freq: Every day | ORAL | Status: DC

## 2013-08-26 NOTE — Patient Instructions (Signed)
Start Protonix 40mg  daily and Rx has been sent to your pharmacy  Finish your bottle of Lipitor  And that start Simvastatin 20mg  daily. And Rx has been sent to your pharmacy  You have been referred to GI for Gabriel [name] and Dysphagia  You have been referred to [facility] for a sleep study  Your physician recommends that you return for a FASTING lipid profile and lft in 3 months  Your physician wants you to follow-up in: 6 months with Dr.Hochrein You will receive a reminder letter in the mail two months in advance. If you don't receive a letter, please call our office to schedule the follow-up appointment.

## 2013-08-26 NOTE — Progress Notes (Signed)
[address], [address] [place], [place]  [address] Phone: [phone] Fax:  [phone]  Date:  08/26/2013   ID:  Gabriel [name], DOB [birth year], MRN [id number]  PCP:  Carrie Mew, MD  Cardiologist:  Dr. Rollene Rotunda     History of Present Illness: Gabriel [name] is a 47 y.o. male with a hx of T2DM, HTN, HL, hypothyroidism, FHx of premature CAD, tobacco abuse.  He was admitted twice this month with chest pain c/w unstable angina.  He ruled out for MI.  He underwent Cardiac CTA (08/10/2013):  Prominent mixed plaque in the proximal LAD, Ca score 238 Agatston units (95 th percentile - high risk cohort for future cardiac events).  He was treated medically.  He was readmitted 12/9-12/11 with recurrent CP.  MI was r/o.  LHC (08/18/13):  pLAD 50, CFX and RCA normal.  EF 55-65%.  LAD lesion FFR:  0.88 (normal).  Med rx recommended.    Since d/c, he has noted occasional chest pain with lying flat.  He also notes a significant hx of belching, indigestion and dysphagia.  He denies weight loss, melena, night sweats or fevers.  He takes H2RA prn.  He has never had esophageal dilatation. He denies exertional chest pain.  He has dyspnea with more extreme activities.  He sleeps on 2 pillows due to acid reflux.  He denies PND, edema.  No syncope.    Recent Labs: 08/09/2013: TSH 5.670*  08/10/2013: HDL 28*; LDL (calc) 98  08/17/2013: Hemoglobin 14.1; Pro B Natriuretic peptide (BNP) 7.0  08/18/2013: ALT 42; Creatinine 1.00; Potassium 3.7   Wt Readings from Last 3 Encounters:  08/26/13 294 lb 6.4 oz (133.539 kg)  08/18/13 300 lb 1.6 oz (136.124 kg)  08/18/13 300 lb 1.6 oz (136.124 kg)     Past Medical History  Diagnosis Date  . Hypertension     a. Dx 3y ago.  [name] Hyperlipidemia     a. Dx 3y ago - not on statin.  [name] GERD (gastroesophageal reflux disease)   . Hypothyroidism     a. on replacement.  . Obesity   . Snoring   . Chest pain     a. 08/2013 Cardia CTA: Ca score 238 (95%),  LM nl, LAD mod stenosis in D2 area, D1 mild ost stenosis, D2 no signif dzs, LCX small, RCA nl.  . Type II diabetes mellitus   . Arthritis     "joints; from where I've had surgeries" (08/17/2013)  . Coronary artery disease     LHC (08/18/13):  pLAD 50, CFX and RCA normal.  EF 55-65%.  LAD lesion FFR:  0.88 (normal).  Med rx recommended.      Current Outpatient Prescriptions  Medication Sig Dispense Refill  . acetaminophen (TYLENOL) 500 MG tablet Take 1,000 mg by mouth every 6 (six) hours as needed for mild pain or moderate pain.      [name] aspirin EC 81 MG EC tablet Take 1 tablet (81 mg total) by mouth daily.      [name] atorvastatin (LIPITOR) 80 MG tablet Take 1 tablet (80 mg total) by mouth daily at 6 PM.  30 tablet  6  . ibuprofen (ADVIL,MOTRIN) 200 MG tablet Take 400 mg by mouth every 6 (six) hours as needed for mild pain or moderate pain.      [name] levothyroxine (SYNTHROID, LEVOTHROID) 100 MCG tablet Take 100 mcg by mouth daily.      [name] losartan (COZAAR) 100 MG tablet Take 100  mg by mouth daily.      . metFORMIN (GLUCOPHAGE) 500 MG tablet Take 1 tablet (500 mg total) by mouth 2 (two) times daily with a meal. HOLD metformin x 48 hours after receiving IV contrast for cardiac cath. Restart on 08/21/2013      . Multiple Vitamins-Minerals (MULTIVITAMIN,TX-MINERALS) tablet Take 1 tablet by mouth daily.        . nitroGLYCERIN (NITROSTAT) 0.4 MG SL tablet Place 1 tablet (0.4 mg total) under the tongue every 5 (five) minutes as needed for chest pain (CP or SOB).  25 tablet  3   No current facility-administered medications for this visit.    Allergies:   Review of patient's allergies indicates no known allergies.   Social History:  The patient  reports that he quit smoking about 5 years ago. He has never used smokeless tobacco. He reports that he does not drink alcohol or use illicit drugs.   Family History:  The patient's family history includes Coronary artery disease in his father; Diabetes in his brother,  father, and mother; Hypertension in his father and mother; Obesity in his brother and mother; Stroke in his father. He was adopted.   ROS:  Please see the history of present illness.  He notes a significant hx of snoring and daytime sleepiness.    All other systems reviewed and negative.   PHYSICAL EXAM: VS:  BP 118/68  Pulse 86  Ht 5' 8.5" (1.74 m)  Wt 294 lb 6.4 oz (133.539 kg)  BMI 44.11 kg/m2 Well nourished, well developed, in no acute distress HEENT: normal Neck: no JVD Cardiac:  normal S1, S2; RRR; no murmur Lungs:  clear to auscultation bilaterally, no wheezing, rhonchi or rales Abd: soft, nontender, no hepatomegaly Ext: no edema. right wrist without hematoma or mass  Skin: warm and dry Neuro:  CNs 2-12 intact, no focal abnormalities noted  EKG:  NSR, HR 86, leftward axis, nonspecific ST-T wave changes, no change from prior tracings     ASSESSMENT AND PLAN:  1. CAD:  He has not had any symptoms suggestive of angina.  He will remain on ASA, statin, ARB.   2. GERD:  I suspect his chest pain is mainly related to this.  He has symptoms of dysphagia and symptoms of indigestion several days a week.  I will put him on Protonix 40 QD.  I will refer him to GI.   3. Snoring:  Arrange sleep study.  4. Hypertension:  Controlled.  5. Hyperlipidemia:  He cannot afford Lipitor.  Change to Simvastatin 20 mg QHS.  Check lipids and LFTs in 3 mos.  6. Diabetes Mellitus:  F/u with primary care. 7. Obesity:  We discussed the importance of weight loss and different strategies.   8. Disposition:  F/u with Dr. Rollene Rotunda in 6 mos.   Signed, Tereso Newcomer, PA-C  08/26/2013 11:34 AM

## 2013-09-27 ENCOUNTER — Other Ambulatory Visit (INDEPENDENT_AMBULATORY_CARE_PROVIDER_SITE_OTHER): Payer: Self-pay

## 2013-09-27 ENCOUNTER — Other Ambulatory Visit: Payer: Self-pay

## 2013-09-27 DIAGNOSIS — I10 Essential (primary) hypertension: Secondary | ICD-10-CM

## 2013-09-28 ENCOUNTER — Telehealth: Payer: Self-pay | Admitting: *Deleted

## 2013-09-28 DIAGNOSIS — I2510 Atherosclerotic heart disease of native coronary artery without angina pectoris: Secondary | ICD-10-CM

## 2013-09-28 DIAGNOSIS — E785 Hyperlipidemia, unspecified: Secondary | ICD-10-CM

## 2013-09-28 LAB — HEPATIC FUNCTION PANEL
ALK PHOS: 85 U/L (ref 39–117)
ALT: 57 U/L — ABNORMAL HIGH (ref 0–53)
AST: 34 U/L (ref 0–37)
Albumin: 4.1 g/dL (ref 3.5–5.2)
BILIRUBIN DIRECT: 0.1 mg/dL (ref 0.0–0.3)
TOTAL PROTEIN: 7.6 g/dL (ref 6.0–8.3)
Total Bilirubin: 1 mg/dL (ref 0.3–1.2)

## 2013-09-28 LAB — LIPID PANEL
Cholesterol: 142 mg/dL (ref 0–200)
HDL: 35.1 mg/dL — ABNORMAL LOW (ref >39.00)
LDL CALC: 73 mg/dL (ref 0–99)
Total CHOL/HDL Ratio: 4
Triglycerides: 171 mg/dL — ABNORMAL HIGH (ref 0.0–149.0)
VLDL: 34.2 mg/dL (ref 0.0–40.0)

## 2013-09-28 NOTE — Telephone Encounter (Signed)
pt notified about lab results and will get repeat lft 11/26/13 at the [facility] on [address]. Pt verbalized understanding to Plan of Care

## 2013-09-29 ENCOUNTER — Ambulatory Visit (HOSPITAL_BASED_OUTPATIENT_CLINIC_OR_DEPARTMENT_OTHER): Payer: Self-pay | Attending: Physician Assistant | Admitting: Radiology

## 2013-09-29 VITALS — Ht 69.0 in | Wt 290.0 lb

## 2013-09-29 DIAGNOSIS — G4730 Sleep apnea, unspecified: Principal | ICD-10-CM

## 2013-09-29 DIAGNOSIS — Z6841 Body Mass Index (BMI) 40.0 and over, adult: Secondary | ICD-10-CM | POA: Insufficient documentation

## 2013-09-29 DIAGNOSIS — R0683 Snoring: Secondary | ICD-10-CM

## 2013-09-29 DIAGNOSIS — R0609 Other forms of dyspnea: Secondary | ICD-10-CM | POA: Insufficient documentation

## 2013-09-29 DIAGNOSIS — R0989 Other specified symptoms and signs involving the circulatory and respiratory systems: Secondary | ICD-10-CM | POA: Insufficient documentation

## 2013-09-29 DIAGNOSIS — G4733 Obstructive sleep apnea (adult) (pediatric): Secondary | ICD-10-CM

## 2013-09-29 DIAGNOSIS — G4710 Hypersomnia, unspecified: Secondary | ICD-10-CM | POA: Insufficient documentation

## 2013-09-30 ENCOUNTER — Ambulatory Visit (INDEPENDENT_AMBULATORY_CARE_PROVIDER_SITE_OTHER): Payer: Self-pay | Admitting: Gastroenterology

## 2013-09-30 ENCOUNTER — Encounter: Payer: Self-pay | Admitting: Gastroenterology

## 2013-09-30 VITALS — BP 108/68 | HR 84 | Ht 68.0 in | Wt 295.1 lb

## 2013-09-30 DIAGNOSIS — R1310 Dysphagia, unspecified: Secondary | ICD-10-CM

## 2013-09-30 NOTE — Assessment & Plan Note (Signed)
The patient was counseled with regard to his co-morbidities associated with obesity.  I recommended that he look into bariatric surgery.  He is agreeable to this and we will supply him information.

## 2013-09-30 NOTE — Patient Instructions (Addendum)
Patient has decided to cancel his procedure because he does not have insurance  It has been recommended to you by your physician that you have a(n) Endoscopy completed. Per your request, we did not schedule the procedure(s) today. Please contact our office at [phone] should you decide to have the procedure completed.

## 2013-09-30 NOTE — Assessment & Plan Note (Signed)
Dysphagia solids is very likely due to 2 a peptic stricture.  An early stricture was seen at endoscopy in 2000.    Recommendations #1 upper endoscopy with dilatation as indicated  Risks, alternatives, and complications of the procedure, including bleeding, perforation, and possible need for surgery, were explained to the patient.  Patient's questions were answered.'

## 2013-09-30 NOTE — Progress Notes (Signed)
_                                                                                                                History of Present Illness: 48 year old white male referred for evaluation of dysphagia.  He's complaining of dysphagia to solids.  When this occurs she develops chest discomfort as well.  He's been having pyrosis which has improved with Protonix.  In 2000 underwent upper endoscopy which demonstrated an early nonobstructing esophageal stricture.    Past Medical History  Diagnosis Date  . Hypertension     a. Dx 3y ago.  [name] Hyperlipidemia     a. Dx 3y ago - not on statin.  [name] GERD (gastroesophageal reflux disease)   . Hypothyroidism     a. on replacement.  . Obesity   . Snoring   . Chest pain     a. 08/2013 Cardia CTA: Ca score 238 (95%), LM nl, LAD mod stenosis in D2 area, D1 mild ost stenosis, D2 no signif dzs, LCX small, RCA nl.  . Type II diabetes mellitus   . Arthritis     "joints; from where I've had surgeries" (08/17/2013)  . Coronary artery disease     LHC (08/18/13):  pLAD 50, CFX and RCA normal.  EF 55-65%.  LAD lesion FFR:  0.88 (normal).  Med rx recommended.    . Sleep apnea    Past Surgical History  Procedure Laterality Date  . Shoulder arthroscopy w/ rotator cuff repair Left   . Knee arthroscopy Right X 2  . Ankle surgery Right     "had a growth in it; cut it out" (08/17/2013)  . Cholecystectomy     family history includes Asthma in his maternal grandfather; Coronary artery disease in his father; Diabetes in his brother, father, and mother; Hypertension in his father and mother; Obesity in his brother and mother; Stroke in his father. He was adopted. Current Outpatient Prescriptions  Medication Sig Dispense Refill  . acetaminophen (TYLENOL) 500 MG tablet Take 1,000 mg by mouth every 6 (six) hours as needed for mild pain or moderate pain.      [name] aspirin EC 81 MG EC tablet Take 1 tablet (81 mg total) by mouth daily.      [name] atorvastatin  (LIPITOR) 80 MG tablet Take 1 tablet (80 mg total) by mouth daily at 6 PM.  30 tablet  6  . ibuprofen (ADVIL,MOTRIN) 200 MG tablet Take 400 mg by mouth every 6 (six) hours as needed for mild pain or moderate pain.      [name] levothyroxine (SYNTHROID, LEVOTHROID) 100 MCG tablet Take 100 mcg by mouth daily.      [name] losartan (COZAAR) 100 MG tablet Take 100 mg by mouth daily.      . metFORMIN (GLUCOPHAGE) 500 MG tablet Take 1 tablet (500 mg total) by mouth 2 (two) times daily with a meal. HOLD metformin x 48 hours after receiving IV contrast for cardiac cath. Restart on 08/21/2013      .  Multiple Vitamins-Minerals (MULTIVITAMIN,TX-MINERALS) tablet Take 1 tablet by mouth daily.        . nitroGLYCERIN (NITROSTAT) 0.4 MG SL tablet Place 1 tablet (0.4 mg total) under the tongue every 5 (five) minutes as needed for chest pain (CP or SOB).  25 tablet  3  . pantoprazole (PROTONIX) 40 MG tablet Take 1 tablet (40 mg total) by mouth daily.  30 tablet  11  . simvastatin (ZOCOR) 20 MG tablet Take 1 tablet (20 mg total) by mouth at bedtime.  30 tablet  11   No current facility-administered medications for this visit.   Allergies as of 09/30/2013  . (No Known Allergies)    reports that he quit smoking about 6 years ago. His smoking use included Cigarettes. He has a 36 pack-year smoking history. He has never used smokeless tobacco. He reports that he does not drink alcohol or use illicit drugs.     Review of Systems: He complains of fatigue and dyspnea on exertion Pertinent positive and negative review of systems were noted in the above HPI section. All other review of systems were otherwise negative.  Vital signs were reviewed in today's medical record Physical Exam: General: He is an obese male in no acute distress Skin: anicteric Head: Normocephalic and atraumatic Eyes:  sclerae anicteric, EOMI Ears: Normal auditory acuity Mouth: No deformity or lesions Neck: Supple, no masses or thyromegaly Lungs: Clear  throughout to auscultation Heart: Regular rate and rhythm; no murmurs, rubs or bruits Abdomen: Soft, non tender and non distended. No masses, hepatosplenomegaly or hernias noted. Normal Bowel sounds Rectal:deferred Musculoskeletal: Symmetrical with no gross deformities  Skin: No lesions on visible extremities Pulses:  Normal pulses noted Extremities: No clubbing, cyanosis, edema or deformities noted Neurological: Alert oriented x 4, grossly nonfocal Cervical Nodes:  No significant cervical adenopathy Inguinal Nodes: No significant inguinal adenopathy Psychological:  Alert and cooperative. Normal mood and affect  See Assessment and Plan under Problem List

## 2013-10-06 DIAGNOSIS — G4733 Obstructive sleep apnea (adult) (pediatric): Secondary | ICD-10-CM

## 2013-10-06 DIAGNOSIS — G4710 Hypersomnia, unspecified: Secondary | ICD-10-CM

## 2013-10-06 DIAGNOSIS — R0609 Other forms of dyspnea: Secondary | ICD-10-CM

## 2013-10-06 DIAGNOSIS — R0989 Other specified symptoms and signs involving the circulatory and respiratory systems: Secondary | ICD-10-CM

## 2013-10-06 DIAGNOSIS — G4730 Sleep apnea, unspecified: Secondary | ICD-10-CM

## 2013-10-06 NOTE — Sleep Study (Signed)
   NAME: Gabriel [name] DATE OF BIRTH:  22-Aug-1966 MEDICAL RECORD NUMBER [id number]  LOCATION: [facility]  PHYSICIAN: Kathee Delton  DATE OF STUDY: 09/29/2013  SLEEP STUDY TYPE: Nocturnal Polysomnogram               REFERRING PHYSICIAN: Richardson Dopp T, PA-C  INDICATION FOR STUDY: Hypersomnia with sleep apnea  EPWORTH SLEEPINESS SCORE:  13 HEIGHT: 5\' 9"  (175.3 cm)  WEIGHT: 290 lb (131.543 kg)    Body mass index is 42.81 kg/(m^2).  NECK SIZE: 17 in.  SLEEP ARCHITECTURE: The patient had a total sleep time of 265 minutes, with no slow-wave sleep and only 60 minutes of REM. Sleep onset latency was normal at 18 minutes, and REM onset was normal at 113 minutes. Sleep efficiency was significantly reduced at 67%.  RESPIRATORY DATA: The patient was found to have 49 obstructive apneas, 232 central apneas, and 20 obstructive hypopneas, giving him an AHI of 68 events per hour. The events occurred in all body positions, and there was moderate snoring noted throughout.  OXYGEN DATA: There was oxygen desaturation as low as 84% with the patient's obstructive events  CARDIAC DATA: No clinically significant arrhythmias were noted  MOVEMENT/PARASOMNIA: No significant periodic limb movements or abnormal behaviors were seen.  IMPRESSION/ RECOMMENDATION:    1) severe complex sleep apnea, with an AHI of 68 events per hour and oxygen desaturation as low as 84%. Treatment for this degree of sleep apnea should focus on a positive pressure device, as well as aggressive weight loss. Given the patient's significant central events, I would highly recommend a return to the sleep Center for a formal titration with CPAP initially, but the ability to change over to bilevel or an ASV device if there are increased pressure induced apneas.     [place], American Board of Sleep Medicine  ELECTRONICALLY SIGNED ON:  10/06/2013, 1:47 PM [place] PH:  [phone]   FX: [phone] [place]

## 2013-10-08 NOTE — Sleep Study (Signed)
Sleep study shows severe sleep apnea. Refer to Dr. Gwenette Greet for further evaluation and management. If there is a delay in getting him an appointment, go ahead and arrange CPAP titration study (to have done before he sees Dr. Gwenette Greet). Richardson Dopp, PA-C   10/08/2013 1:43 PM

## 2013-10-11 ENCOUNTER — Telehealth: Payer: Self-pay | Admitting: *Deleted

## 2013-10-11 DIAGNOSIS — G4730 Sleep apnea, unspecified: Secondary | ICD-10-CM

## 2013-10-11 NOTE — Telephone Encounter (Signed)
pt notified about sleep study results showing severe sleep apnea. I explained I will have scheduling cb and set up with Dr. Gwenette Greet. Pt said ok and ok to leave detailed message on machine

## 2013-10-14 ENCOUNTER — Ambulatory Visit (INDEPENDENT_AMBULATORY_CARE_PROVIDER_SITE_OTHER): Payer: Self-pay | Admitting: Pulmonary Disease

## 2013-10-14 ENCOUNTER — Encounter: Payer: Self-pay | Admitting: Pulmonary Disease

## 2013-10-14 VITALS — BP 140/82 | HR 80 | Ht 68.0 in | Wt 298.0 lb

## 2013-10-14 DIAGNOSIS — G4739 Other sleep apnea: Secondary | ICD-10-CM

## 2013-10-14 DIAGNOSIS — G4730 Sleep apnea, unspecified: Secondary | ICD-10-CM

## 2013-10-14 DIAGNOSIS — G4731 Primary central sleep apnea: Secondary | ICD-10-CM | POA: Insufficient documentation

## 2013-10-14 NOTE — Progress Notes (Signed)
Chief Complaint  Patient presents with  . Sleep Consult    referred by Dr. Kathlen Mody for OSA    History of Present Illness: Gabriel [name] is a 48 y.o. male for evaluation of sleep problems.  He is followed by cardiology for CAD.  During recent evaluation he was noted to have snoring, sleep disruption, apnea, and daytime sleepiness.  He was referred for sleep study on 09/29/13 which showed severe complex sleep apnea.  He was referred to pulmonary/sleep for further management.  He goes to sleep between 10 pm and midnight.  He has trouble falling asleep sometimes.  He used to take tylenol PM, but no longer uses sleep aides.  He wakes up several times during the night.  He describes himself as a Educational psychologist.  He gets out of bed at 10 am.  He feels tired in the morning.  He denies morning headache.  He does not use anything to help him stay awake although he used to drink 2 liters of [employer] per day.  He snores, and has been told he stops breathing while asleep.  He wakes up with a cough when he sleeps on his back.  He occasionally gets leg cramps.  He denies sleep walking, sleep talking, bruxism, or nightmares.  There is no history of restless legs.  He denies sleep hallucinations, sleep paralysis, or cataplexy.  The Epworth score is 12 out of 24.  Tests: PSG 09/29/13 >> AHI 68.3, OI 9.8, CI 52.6, SpO2 low 84%  Gabriel [name]  has a past medical history of Hypertension; Hyperlipidemia; GERD (gastroesophageal reflux disease); Hypothyroidism; Obesity; Snoring; Chest pain; Type II diabetes mellitus; Arthritis; Coronary artery disease; and Sleep apnea.  Gabriel [name]  has past surgical history that includes Shoulder arthroscopy w/ rotator cuff repair (Left); Knee arthroscopy (Right, X 2); Ankle surgery (Right); and Cholecystectomy.  Prior to Admission medications   Medication Sig Start Date End Date Taking? Authorizing Provider  acetaminophen (TYLENOL) 500 MG tablet Take 1,000 mg by  mouth every 6 (six) hours as needed for mild pain or moderate pain.   Yes Historical Provider, MD  aspirin EC 81 MG EC tablet Take 1 tablet (81 mg total) by mouth daily. 08/10/13  Yes Rogelia Mire, NP  atorvastatin (LIPITOR) 80 MG tablet Take 1 tablet (80 mg total) by mouth daily at 6 PM. 08/10/13  Yes Rogelia Mire, NP  ibuprofen (ADVIL,MOTRIN) 200 MG tablet Take 400 mg by mouth every 6 (six) hours as needed for mild pain or moderate pain.   Yes Historical Provider, MD  levothyroxine (SYNTHROID, LEVOTHROID) 100 MCG tablet Take 100 mcg by mouth daily.   Yes Historical Provider, MD  losartan (COZAAR) 100 MG tablet Take 100 mg by mouth daily.   Yes Historical Provider, MD  metFORMIN (GLUCOPHAGE) 500 MG tablet Take 1 tablet (500 mg total) by mouth 2 (two) times daily with a meal. HOLD metformin x 48 hours after receiving IV contrast for cardiac cath. Restart on 08/21/2013 08/18/13  Yes Rhonda G Barrett, PA-C  Multiple Vitamins-Minerals (MULTIVITAMIN,TX-MINERALS) tablet Take 1 tablet by mouth daily.     Yes Historical Provider, MD  nitroGLYCERIN (NITROSTAT) 0.4 MG SL tablet Place 1 tablet (0.4 mg total) under the tongue every 5 (five) minutes as needed for chest pain (CP or SOB). 08/10/13  Yes Rogelia Mire, NP  pantoprazole (PROTONIX) 40 MG tablet Take 1 tablet (40 mg total) by mouth daily. 08/26/13  Yes Liliane Shi, PA-C  simvastatin (ZOCOR) 20 MG tablet Take 1 tablet (20 mg total) by mouth at bedtime. 08/26/13  Yes Liliane Shi, PA-C    No Known Allergies  His family history includes Asthma in his maternal grandfather; Coronary artery disease in his father; Diabetes in his brother, father, and mother; Hypertension in his father and mother; Obesity in his brother and mother; Stroke in his father. He was adopted.  He  reports that he quit smoking about 6 years ago. His smoking use included Cigarettes. He has a 36 pack-year smoking history. He has never used smokeless tobacco. He  reports that he does not drink alcohol or use illicit drugs.   Physical Exam:  General - No distress ENT - No sinus tenderness, no oral exudate, no LAN, no thyromegaly, TM clear, pupils equal/reactive, MP 4, wears dentures, enlarged tongue Cardiac - s1s2 regular, no murmur, pulses symmetric Chest - No wheeze/rales/dullness, good air entry, normal respiratory excursion Back - No focal tenderness Abd - Soft, non-tender, no organomegaly, + bowel sounds Ext - No edema Neuro - Normal strength, cranial nerves intact Skin - No rashes Psych - Normal mood, and behavior  Assessment/plan:  Chesley Mires, MD [place] 10/14/2013, 9:24 AM Pager:  [phone] After 3pm call: [phone]

## 2013-10-14 NOTE — Assessment & Plan Note (Signed)
I have reviewed the recent sleep study results with the patient.  We discussed how sleep apnea can affect various health problems including risks for hypertension, cardiovascular disease, and diabetes.  We also discussed how sleep disruption can increase risks for accident, such as while driving.  Weight loss as a means of improving sleep apnea was also reviewed.  Additional treatment options discussed were CPAP therapy, oral appliance, and surgical intervention.  Will arrange for in lab titration study.  Depending on results will determine if he needs CPAP vs BiPAP vs ASV +/- supplemental oxygen.

## 2013-10-14 NOTE — Patient Instructions (Signed)
Will arrange for CPAP titration study Will call to arrange for follow up after sleep study reviewed  

## 2013-10-14 NOTE — Progress Notes (Signed)
   Subjective:    Patient ID: Gabriel [name], male    DOB: 1966/02/25, 48 y.o.   MRN: [id number]  HPI    Review of Systems  Constitutional: Negative for fever, chills, diaphoresis, activity change, appetite change, fatigue and unexpected weight change.  HENT: Negative for congestion, dental problem, ear discharge, ear pain, facial swelling, hearing loss, mouth sores, nosebleeds, postnasal drip, rhinorrhea, sinus pressure, sneezing, sore throat, tinnitus, trouble swallowing and voice change.   Eyes: Negative for photophobia, discharge, itching and visual disturbance.  Respiratory: Negative for apnea, cough, choking, chest tightness, shortness of breath, wheezing and stridor.   Cardiovascular: Negative for chest pain, palpitations and leg swelling.  Gastrointestinal: Negative for nausea, vomiting, abdominal pain, constipation, blood in stool and abdominal distention.       Acid Heartburn Indigestion  Genitourinary: Negative for dysuria, urgency, frequency, hematuria, flank pain, decreased urine volume and difficulty urinating.  Musculoskeletal: Positive for back pain. Negative for arthralgias, gait problem, joint swelling, myalgias, neck pain and neck stiffness.  Skin: Negative for color change, pallor and rash.  Neurological: Negative for dizziness, tremors, seizures, syncope, speech difficulty, weakness, light-headedness, numbness and headaches.  Hematological: Negative for adenopathy. Does not bruise/bleed easily.  Psychiatric/Behavioral: Negative for confusion, sleep disturbance and agitation. The patient is not nervous/anxious.        Objective:   Physical Exam        Assessment & Plan:

## 2013-10-14 NOTE — Assessment & Plan Note (Signed)
Discussed options for assisting with weight loss, including whether he should consider bariatric surgery.  He may be a good candidate for surgical intervention for his weight issues.

## 2013-10-21 ENCOUNTER — Encounter: Payer: Self-pay | Admitting: Gastroenterology

## 2013-11-08 ENCOUNTER — Ambulatory Visit (AMBULATORY_SURGERY_CENTER): Payer: Self-pay | Admitting: Gastroenterology

## 2013-11-08 ENCOUNTER — Encounter: Payer: Self-pay | Admitting: Gastroenterology

## 2013-11-08 VITALS — BP 109/68 | HR 69 | Temp 97.1°F | Resp 14 | Ht 68.0 in | Wt 295.0 lb

## 2013-11-08 DIAGNOSIS — R1310 Dysphagia, unspecified: Secondary | ICD-10-CM

## 2013-11-08 LAB — GLUCOSE, CAPILLARY
GLUCOSE-CAPILLARY: 97 mg/dL (ref 70–99)
Glucose-Capillary: 96 mg/dL (ref 70–99)

## 2013-11-08 MED ORDER — SODIUM CHLORIDE 0.9 % IV SOLN: 500.0000 mL | INTRAVENOUS | Status: DC

## 2013-11-08 NOTE — Patient Instructions (Signed)
Impressions/recommendations:  Dilation diet  (handout given) Continue PPI medication Please call the office and make an appointment to meet with Dr. Deatra Ina in 4-5 weeks  YOU HAD AN ENDOSCOPIC PROCEDURE TODAY AT [place]: Refer to the procedure report that was given to you for any specific questions about what was found during the examination.  If the procedure report does not answer your questions, please call your gastroenterologist to clarify.  If you requested that your care partner not be given the details of your procedure findings, then the procedure report has been included in a sealed envelope for you to review at your convenience later.  YOU SHOULD EXPECT: Some feelings of bloating in the abdomen. Passage of more gas than usual.  Walking can help get rid of the air that was put into your GI tract during the procedure and reduce the bloating. If you had a lower endoscopy (such as a colonoscopy or flexible sigmoidoscopy) you may notice spotting of blood in your stool or on the toilet paper. If you underwent a bowel prep for your procedure, then you may not have a normal bowel movement for a few days.  DIET: Your first meal following the procedure should be a light meal and then it is ok to progress to your normal diet.  A half-sandwich or bowl of soup is an example of a good first meal.  Heavy or fried foods are harder to digest and may make you feel nauseous or bloated.  Likewise meals heavy in dairy and vegetables can cause extra gas to form and this can also increase the bloating.  Drink plenty of fluids but you should avoid alcoholic beverages for 24 hours.  ACTIVITY: Your care partner should take you home directly after the procedure.  You should plan to take it easy, moving slowly for the rest of the day.  You can resume normal activity the day after the procedure however you should NOT DRIVE or use heavy machinery for 24 hours (because of the sedation medicines used  during the test).    SYMPTOMS TO REPORT IMMEDIATELY: A gastroenterologist can be reached at any hour.  During normal business hours, 8:30 AM to 5:00 PM Monday through Friday, call [phone].  After hours and on weekends, please call the GI answering service at [phone] who will take a message and have the physician on call contact you.   Following upper endoscopy (EGD)  Vomiting of blood or coffee ground material  New chest pain or pain under the shoulder blades  Painful or persistently difficult swallowing  New shortness of breath  Fever of 100F or higher  Black, tarry-looking stools  FOLLOW UP: If any biopsies were taken you will be contacted by phone or by letter within the next 1-3 weeks.  Call your gastroenterologist if you have not heard about the biopsies in 3 weeks.  Our staff will call the home number listed on your records the next business day following your procedure to check on you and address any questions or concerns that you may have at that time regarding the information given to you following your procedure. This is a courtesy call and so if there is no answer at the home number and we have not heard from you through the emergency physician on call, we will assume that you have returned to your regular daily activities without incident.  SIGNATURES/CONFIDENTIALITY: You and/or your care partner have signed paperwork which will be entered into your electronic medical  record.  These signatures attest to the fact that that the information above on your After Visit Summary has been reviewed and is understood.  Full responsibility of the confidentiality of this discharge information lies with you and/or your care-partner. 

## 2013-11-08 NOTE — Progress Notes (Signed)
Called to room to assist during endoscopic procedure.  Patient ID and intended procedure confirmed with present staff. Received instructions for my participation in the procedure from the performing physician.  

## 2013-11-08 NOTE — Op Note (Signed)
[place]  [address] [place], [id number]   ENDOSCOPY PROCEDURE REPORT  PATIENT: Gabriel [name], Gabriel [name]  MR#: [id number] BIRTHDATE: 11/05/1965 , 47  yrs. old GENDER: Male ENDOSCOPIST: Inda Castle, MD REFERRED BY:  Ricard Dillon, M.D. PROCEDURE DATE:  11/08/2013 PROCEDURE:  EGD, diagnostic and Maloney dilation of esophagus ASA CLASS:     Class III INDICATIONS:  Dysphagia. MEDICATIONS: MAC sedation, administered by CRNA, propofol (Diprivan) 150mg  IV, and Simethicone 0.6cc PO TOPICAL ANESTHETIC: Cetacaine Spray  DESCRIPTION OF PROCEDURE: After the risks benefits and alternatives of the procedure were thoroughly explained, informed consent was obtained.  The LB [id number] endoscope was introduced through the mouth and advanced to the third portion of the duodenum. Without limitations.  The instrument was slowly withdrawn as the mucosa was fully examined.      The upper, middle and distal third of the esophagus were carefully inspected and no abnormalities were noted.  The z-line was well seen at the GEJ.  The endoscope was pushed into the fundus which was normal including a retroflexed view.  The antrum, gastric body, first and second part of the duodenum were unremarkable. Retroflexed views revealed no abnormalities.     The scope was then withdrawn from the patient and the procedure completed.  Because of complaints of dysphagia a #52 [place] Maloney dilator was passed, without resistance.  There was no heme.  COMPLICATIONS: There were no complications. ENDOSCOPIC IMPRESSION: dysphagia without obvious stricture-status post Maloney dilation  RECOMMENDATIONS: 1.  continue PPI medication 2.  office visit 4-5 weeks REPEAT EXAM:  eSigned:  Inda Castle, MD 11/08/2013 4:31 PM   CC:

## 2013-11-08 NOTE — Progress Notes (Signed)
Report to pacu rn, vss, bbs=clear 

## 2013-11-09 ENCOUNTER — Telehealth: Payer: Self-pay | Admitting: *Deleted

## 2013-11-09 NOTE — Telephone Encounter (Signed)
  Follow up Call-  Call back number 11/08/2013  Post procedure Call Back phone  # [phone]  Permission to leave phone message Yes     Patient questions:  Do you have a fever, pain , or abdominal swelling? no Pain Score  0 *  Have you tolerated food without any problems? yes  Have you been able to return to your normal activities? yes  Do you have any questions about your discharge instructions: Diet   no Medications  no Follow up visit  no  Do you have questions or concerns about your Care? no  Actions: * If pain score is 4 or above: No action needed, pain <4.

## 2013-11-14 ENCOUNTER — Ambulatory Visit (HOSPITAL_BASED_OUTPATIENT_CLINIC_OR_DEPARTMENT_OTHER): Payer: Self-pay | Attending: Pulmonary Disease

## 2013-11-14 VITALS — Ht 69.0 in | Wt 290.0 lb

## 2013-11-14 DIAGNOSIS — I2510 Atherosclerotic heart disease of native coronary artery without angina pectoris: Secondary | ICD-10-CM | POA: Insufficient documentation

## 2013-11-14 DIAGNOSIS — G4730 Sleep apnea, unspecified: Secondary | ICD-10-CM | POA: Insufficient documentation

## 2013-11-14 DIAGNOSIS — Z79899 Other long term (current) drug therapy: Secondary | ICD-10-CM | POA: Insufficient documentation

## 2013-11-14 DIAGNOSIS — G4733 Obstructive sleep apnea (adult) (pediatric): Secondary | ICD-10-CM

## 2013-11-14 DIAGNOSIS — Z7982 Long term (current) use of aspirin: Secondary | ICD-10-CM | POA: Insufficient documentation

## 2013-11-14 DIAGNOSIS — Z9989 Dependence on other enabling machines and devices: Secondary | ICD-10-CM

## 2013-11-14 DIAGNOSIS — I10 Essential (primary) hypertension: Secondary | ICD-10-CM | POA: Insufficient documentation

## 2013-11-14 DIAGNOSIS — G4731 Primary central sleep apnea: Secondary | ICD-10-CM

## 2013-11-16 ENCOUNTER — Telehealth: Payer: Self-pay | Admitting: Pulmonary Disease

## 2013-11-16 DIAGNOSIS — G4730 Sleep apnea, unspecified: Secondary | ICD-10-CM

## 2013-11-16 DIAGNOSIS — G4733 Obstructive sleep apnea (adult) (pediatric): Secondary | ICD-10-CM

## 2013-11-16 NOTE — Sleep Study (Signed)
[place]  NAME: Gabriel [name] DATE OF BIRTH:  [birth year] MEDICAL RECORD NUMBER [id number]  LOCATION: [facility]  PHYSICIAN: Chesley Mires, M.D. DATE OF STUDY: 11/14/2013  SLEEP STUDY TYPE: Nocturnal Polysomnogram               REFERRING PHYSICIAN: Chesley Mires, MD  INDICATION FOR STUDY:  48 year old male with snoring, sleep disruption, and daytime sleepiness.  He has history of hypertension and coronary artery disease.  He had sleep study from 09/29/13 which showed severe complex sleep apnea with AHI of 68.3 and SaO2 low of 84%.  He returns to sleep lab for titration study.  EPWORTH SLEEPINESS SCORE: 13. HEIGHT: 5\' 9"  (175.3 cm)  WEIGHT: 290 lb (131.543 kg)    Body mass index is 42.81 kg/(m^2).  NECK SIZE: 17 in.  MEDICATIONS:  Current Outpatient Prescriptions on File Prior to Visit  Medication Sig Dispense Refill  . acetaminophen (TYLENOL) 500 MG tablet Take 1,000 mg by mouth every 6 (six) hours as needed for mild pain or moderate pain.      [name] aspirin EC 81 MG EC tablet Take 1 tablet (81 mg total) by mouth daily.      [name] atorvastatin (LIPITOR) 80 MG tablet Take 1 tablet (80 mg total) by mouth daily at 6 PM.  30 tablet  6  . ibuprofen (ADVIL,MOTRIN) 200 MG tablet Take 400 mg by mouth every 6 (six) hours as needed for mild pain or moderate pain.      [name] levothyroxine (SYNTHROID, LEVOTHROID) 100 MCG tablet Take 100 mcg by mouth daily.      [name] losartan (COZAAR) 100 MG tablet Take 100 mg by mouth daily.      . metFORMIN (GLUCOPHAGE) 500 MG tablet Take 1 tablet (500 mg total) by mouth 2 (two) times daily with a meal. HOLD metformin x 48 hours after receiving IV contrast for cardiac cath. Restart on 08/21/2013      . Multiple Vitamins-Minerals (MULTIVITAMIN,TX-MINERALS) tablet Take 1 tablet by mouth daily.        . nitroGLYCERIN (NITROSTAT) 0.4 MG SL tablet Place 1 tablet (0.4 mg total) under the tongue every 5 (five) minutes as needed for chest  pain (CP or SOB).  25 tablet  3  . pantoprazole (PROTONIX) 40 MG tablet Take 1 tablet (40 mg total) by mouth daily.  30 tablet  11  . simvastatin (ZOCOR) 20 MG tablet Take 1 tablet (20 mg total) by mouth at bedtime.  30 tablet  11   No current facility-administered medications on file prior to visit.    SLEEP ARCHITECTURE:  Total recording time: 409.5 minutes.  Total sleep time was: 324.5 minutes.  Sleep efficiency: 79.1%.  Sleep latency: 10 minutes.  REM latency: 65.5 minutes.  Stage N1: 3.7%.  Stage N2: 75.2%.  Stage N3: 0%.  Stage R:  21.1%.  Supine sleep: 214 minutes.  Non-supine sleep: 110 minutes.  RESPIRATORY DATA: Average respiratory rate: 16. Snoring: Loud.  He was started on CPAP 5 and increased to 16 cm H2O.  With CPAP 16 cm H2O his AHI was reduced to 0.3.  At this pressure he was observed in REM and supine sleep.  OXYGEN DATA:  Baseline oxygenation: 94%. Lowest SaO2: 89%. Time spent below SaO2 90%: 0.3 minutes. Supplemental oxygen used: None.  CARDIAC DATA:  Average heart rate: 68 beats per minute. Rhythm strip: normal sinus rhythm.  MOVEMENT/PARASOMNIA:  Periodic limb movement: 0.  Period limb movements  with arousals: 0. Restroom trips: One.  IMPRESSION/ RECOMMENDATION:   This was a successful CPAP titration study.  He did well with CPAP 16 cm H2O.  He was fitted with a Simplus medium sized full face mask.   Chesley Mires, M.D. Diplomate, Tax adviser of Sleep Medicine  ELECTRONICALLY SIGNED ON:  11/16/2013, 4:11 PM [place] PH: [phone]   FX: [phone] [place]

## 2013-11-16 NOTE — Telephone Encounter (Signed)
CPAP titration 11/14/13 >> CPAP 16 cm H2O >> AHI 0.3, +R, +S.  Will have my nurse inform pt that he did very well with CPAP titration.  Will proceed with CPAP set up and ROV 2 months after set up unless he would like ROV first.  If he is agreeable to CPAP set up, then please arrange for CPAP 16 cm H2O with heated humidity and mask of choice.

## 2013-11-17 NOTE — Telephone Encounter (Signed)
Pt is aware of CPAP titration results. Would like to proceed with CPAP set up. Order will be placed. Recall will be placed for 2 month ROV.

## 2013-11-24 ENCOUNTER — Other Ambulatory Visit: Payer: Self-pay | Admitting: Internal Medicine

## 2013-11-24 ENCOUNTER — Ambulatory Visit (INDEPENDENT_AMBULATORY_CARE_PROVIDER_SITE_OTHER): Payer: Self-pay | Admitting: *Deleted

## 2013-11-24 DIAGNOSIS — E785 Hyperlipidemia, unspecified: Secondary | ICD-10-CM

## 2013-11-24 DIAGNOSIS — I2510 Atherosclerotic heart disease of native coronary artery without angina pectoris: Secondary | ICD-10-CM

## 2013-11-24 LAB — LIPID PANEL
Cholesterol: 169 mg/dL (ref 0–200)
HDL: 34.2 mg/dL — ABNORMAL LOW (ref >39.00)
LDL Cholesterol: 97 mg/dL (ref 0–99)
Total CHOL/HDL Ratio: 5
Triglycerides: 190 mg/dL — ABNORMAL HIGH (ref 0.0–149.0)
VLDL: 38 mg/dL (ref 0.0–40.0)

## 2013-11-24 LAB — HEPATIC FUNCTION PANEL
ALK PHOS: 75 U/L (ref 39–117)
ALT: 52 U/L (ref 0–53)
AST: 34 U/L (ref 0–37)
Albumin: 4 g/dL (ref 3.5–5.2)
BILIRUBIN DIRECT: 0.1 mg/dL (ref 0.0–0.3)
Total Bilirubin: 0.8 mg/dL (ref 0.3–1.2)
Total Protein: 7.1 g/dL (ref 6.0–8.3)

## 2013-11-25 ENCOUNTER — Telehealth: Payer: Self-pay | Admitting: *Deleted

## 2013-11-25 DIAGNOSIS — I10 Essential (primary) hypertension: Secondary | ICD-10-CM

## 2013-11-25 DIAGNOSIS — E785 Hyperlipidemia, unspecified: Secondary | ICD-10-CM

## 2013-11-25 MED ORDER — SIMVASTATIN 40 MG PO TABS
40.0000 mg | ORAL_TABLET | Freq: Every day | ORAL | Status: DC
Start: 2013-11-25 — End: 2015-02-01

## 2013-11-25 NOTE — Telephone Encounter (Signed)
pt notified about lab results and to increase simvastatin to 40 mg qhs due to LDL 97 and per Brynda Rim. PA needs to be <70. I called in new Rx for the pt today and he is aware.

## 2013-11-26 ENCOUNTER — Other Ambulatory Visit: Payer: Self-pay

## 2013-11-26 DIAGNOSIS — E785 Hyperlipidemia, unspecified: Secondary | ICD-10-CM

## 2013-11-26 LAB — HEPATIC FUNCTION PANEL
ALBUMIN: 4.2 g/dL (ref 3.5–5.2)
ALT: 54 U/L — ABNORMAL HIGH (ref 0–53)
AST: 33 U/L (ref 0–37)
Alkaline Phosphatase: 77 U/L (ref 39–117)
Bilirubin, Direct: 0.1 mg/dL (ref 0.0–0.3)
TOTAL PROTEIN: 7.3 g/dL (ref 6.0–8.3)
Total Bilirubin: 0.9 mg/dL (ref 0.3–1.2)

## 2013-11-26 LAB — LIPID PANEL
Cholesterol: 182 mg/dL (ref 0–200)
HDL: 33.8 mg/dL — ABNORMAL LOW (ref >39.00)
LDL CALC: 112 mg/dL — AB (ref 0–99)
TRIGLYCERIDES: 179 mg/dL — AB (ref 0.0–149.0)
Total CHOL/HDL Ratio: 5
VLDL: 35.8 mg/dL (ref 0.0–40.0)

## 2013-11-27 ENCOUNTER — Other Ambulatory Visit: Payer: Self-pay | Admitting: Internal Medicine

## 2013-11-30 ENCOUNTER — Other Ambulatory Visit: Payer: Self-pay | Admitting: Internal Medicine

## 2013-12-02 ENCOUNTER — Telehealth: Payer: Self-pay | Admitting: Pulmonary Disease

## 2013-12-02 NOTE — Telephone Encounter (Signed)
lmomtcb x1 for pt to make aware [facility] is trying to contact him

## 2013-12-08 ENCOUNTER — Encounter: Payer: Self-pay | Admitting: Gastroenterology

## 2013-12-08 ENCOUNTER — Ambulatory Visit (INDEPENDENT_AMBULATORY_CARE_PROVIDER_SITE_OTHER): Payer: Self-pay | Admitting: Gastroenterology

## 2013-12-08 VITALS — BP 110/66 | HR 92 | Ht 68.0 in | Wt 304.0 lb

## 2013-12-08 DIAGNOSIS — R1310 Dysphagia, unspecified: Secondary | ICD-10-CM

## 2013-12-08 NOTE — Assessment & Plan Note (Signed)
We spoke at length about issues regarding his obesity including diabetes, sleep apnea and risk for coronary artery disease.  I recommended that he see a nutritionist and attend an information session on bariatric surgery.  Patient is agreeable to both.

## 2013-12-08 NOTE — Patient Instructions (Addendum)
Contact [facility] for consult of Bariatric surgery  Call [phone] We have referred you to [facility] They will contact you for that appointment

## 2013-12-08 NOTE — Progress Notes (Signed)
          History of Present Illness:  The patient has returned following dilation of the esophagus.  No frank stricture was seen but he reports significant improvement.  He is now able to eat without any symptoms of dysphagia.  Despite what he thinks is a 1500-calorie diet he is gaining weight.  He has diabetes and sleep apnea.    Review of Systems: Pertinent positive and negative review of systems were noted in the above HPI section. All other review of systems were otherwise negative.    Current Medications, Allergies, Past Medical History, Past Surgical History, Family History and Social History were reviewed in [facility] record  Vital signs were reviewed in today's medical record. Physical Exam: General: Obese male in no acute distress   See Assessment and Plan under Problem List

## 2013-12-08 NOTE — Assessment & Plan Note (Signed)
No obvious stricture by endoscopy the patient is clearly improved with dilatation therapy.

## 2014-01-17 ENCOUNTER — Ambulatory Visit: Payer: Self-pay | Admitting: Pulmonary Disease

## 2014-01-25 ENCOUNTER — Ambulatory Visit: Payer: Self-pay | Admitting: Dietician

## 2014-02-04 ENCOUNTER — Encounter: Payer: Self-pay | Admitting: Cardiology

## 2014-02-18 ENCOUNTER — Other Ambulatory Visit: Payer: Self-pay | Admitting: Internal Medicine

## 2014-02-22 ENCOUNTER — Ambulatory Visit: Payer: Self-pay | Admitting: Cardiology

## 2014-03-02 ENCOUNTER — Telehealth: Payer: Self-pay

## 2014-03-02 ENCOUNTER — Other Ambulatory Visit: Payer: Self-pay

## 2014-03-02 DIAGNOSIS — I10 Essential (primary) hypertension: Secondary | ICD-10-CM

## 2014-03-02 MED ORDER — METFORMIN HCL 500 MG PO TABS: ORAL_TABLET | ORAL | Status: DC

## 2014-03-02 MED ORDER — LEVOTHYROXINE SODIUM 100 MCG PO TABS: 100.0000 ug | ORAL_TABLET | Freq: Every day | ORAL | Status: DC

## 2014-03-02 MED ORDER — LOSARTAN POTASSIUM 100 MG PO TABS: 100.0000 mg | ORAL_TABLET | Freq: Every day | ORAL | Status: DC

## 2014-03-02 NOTE — Telephone Encounter (Signed)
I agree to fill x 1 month.  Needs PCP

## 2014-03-02 NOTE — Telephone Encounter (Signed)
One time only - pt has to have a PCP!!  Thank you Kalman Shan

## 2014-03-08 ENCOUNTER — Telehealth: Payer: Self-pay | Admitting: Cardiology

## 2014-03-08 DIAGNOSIS — E785 Hyperlipidemia, unspecified: Secondary | ICD-10-CM

## 2014-03-08 DIAGNOSIS — Z79899 Other long term (current) drug therapy: Secondary | ICD-10-CM

## 2014-03-08 NOTE — Telephone Encounter (Signed)
Orders placed, appt scheduled and pt is aware

## 2014-03-08 NOTE — Telephone Encounter (Signed)
New Message:  Pt is requesting a call back. He wants to know if its time for him to have blood drawn again. Pt last had liver and cholesterol check in March of this year. Pt has no orders in Epic.

## 2014-03-08 NOTE — Telephone Encounter (Signed)
Pt is due to have lipid and liver repeated per Richardson Dopp, PA

## 2014-03-16 ENCOUNTER — Other Ambulatory Visit (INDEPENDENT_AMBULATORY_CARE_PROVIDER_SITE_OTHER): Payer: Self-pay

## 2014-03-16 ENCOUNTER — Other Ambulatory Visit: Payer: Self-pay

## 2014-03-16 DIAGNOSIS — Z79899 Other long term (current) drug therapy: Secondary | ICD-10-CM

## 2014-03-16 DIAGNOSIS — E785 Hyperlipidemia, unspecified: Secondary | ICD-10-CM

## 2014-03-16 LAB — HEPATIC FUNCTION PANEL
ALT: 71 U/L — AB (ref 0–53)
AST: 41 U/L — ABNORMAL HIGH (ref 0–37)
Albumin: 4.1 g/dL (ref 3.5–5.2)
Alkaline Phosphatase: 76 U/L (ref 39–117)
BILIRUBIN DIRECT: 0.2 mg/dL (ref 0.0–0.3)
BILIRUBIN TOTAL: 0.9 mg/dL (ref 0.2–1.2)
Total Protein: 7.3 g/dL (ref 6.0–8.3)

## 2014-03-16 LAB — LIPID PANEL
CHOLESTEROL: 147 mg/dL (ref 0–200)
HDL: 33.7 mg/dL — ABNORMAL LOW (ref >39.00)
LDL Cholesterol: 81 mg/dL (ref 0–99)
NonHDL: 113.3
Total CHOL/HDL Ratio: 4
Triglycerides: 163 mg/dL — ABNORMAL HIGH (ref 0.0–149.0)
VLDL: 32.6 mg/dL (ref 0.0–40.0)

## 2014-04-04 ENCOUNTER — Other Ambulatory Visit: Payer: Self-pay | Admitting: *Deleted

## 2014-04-04 ENCOUNTER — Telehealth: Payer: Self-pay | Admitting: Cardiology

## 2014-04-04 ENCOUNTER — Encounter: Payer: Self-pay | Admitting: Cardiology

## 2014-04-04 ENCOUNTER — Ambulatory Visit (INDEPENDENT_AMBULATORY_CARE_PROVIDER_SITE_OTHER): Payer: Self-pay | Admitting: Cardiology

## 2014-04-04 VITALS — BP 150/80 | HR 95 | Ht 68.0 in | Wt 311.0 lb

## 2014-04-04 DIAGNOSIS — I2510 Atherosclerotic heart disease of native coronary artery without angina pectoris: Secondary | ICD-10-CM

## 2014-04-04 DIAGNOSIS — I10 Essential (primary) hypertension: Secondary | ICD-10-CM

## 2014-04-04 LAB — TSH: TSH: 6.83 u[IU]/mL — ABNORMAL HIGH (ref 0.350–4.500)

## 2014-04-04 MED ORDER — LEVOTHYROXINE SODIUM 100 MCG PO TABS: 100.0000 ug | ORAL_TABLET | Freq: Every day | ORAL | Status: DC

## 2014-04-04 MED ORDER — LOSARTAN POTASSIUM 100 MG PO TABS: 100.0000 mg | ORAL_TABLET | Freq: Every day | ORAL | Status: DC

## 2014-04-04 NOTE — Patient Instructions (Signed)
Your physician recommends that you schedule a follow-up appointment in: one year  Have your lab test done

## 2014-04-04 NOTE — Addendum Note (Signed)
Addended by: Dolan Amen. on: 04/04/2014 10:45 AM   Modules accepted: Orders

## 2014-04-04 NOTE — Progress Notes (Signed)
HPI The patient returns for follow up of CAD which is being managed medically.  Since he was last seen he has had no new cardiovascular complaints.  The patient denies any new symptoms such as chest discomfort, neck or arm discomfort. There has been no new shortness of breath, PND or orthopnea. There have been no reported palpitations, presyncope or syncope.  He has been found to have sleep apnea.  He has not been losing weight despite restricting his calories.  However, I am not sure that he understands calorie counting.  He says that he does exercise daily.    No Known Allergies  Current Outpatient Prescriptions  Medication Sig Dispense Refill  . acetaminophen (TYLENOL) 500 MG tablet Take 1,000 mg by mouth every 6 (six) hours as needed for mild pain or moderate pain.      [name] aspirin EC 81 MG EC tablet Take 1 tablet (81 mg total) by mouth daily.      [name] atorvastatin (LIPITOR) 80 MG tablet Take 1 tablet (80 mg total) by mouth daily at 6 PM.  30 tablet  6  . ibuprofen (ADVIL,MOTRIN) 200 MG tablet Take 400 mg by mouth every 6 (six) hours as needed for mild pain or moderate pain.      [name] levothyroxine (SYNTHROID, LEVOTHROID) 100 MCG tablet Take 1 tablet (100 mcg total) by mouth daily.  30 tablet  2  . losartan (COZAAR) 100 MG tablet Take 1 tablet (100 mg total) by mouth daily.  30 tablet  1  . metFORMIN (GLUCOPHAGE) 500 MG tablet TAKE 1 TABLET BY MOUTH 2 TIMES DAILY WITH A MEAL.  60 tablet  2  . Multiple Vitamins-Minerals (MULTIVITAMIN,TX-MINERALS) tablet Take 1 tablet by mouth daily.        . nitroGLYCERIN (NITROSTAT) 0.4 MG SL tablet Place 1 tablet (0.4 mg total) under the tongue every 5 (five) minutes as needed for chest pain (CP or SOB).  25 tablet  3  . pantoprazole (PROTONIX) 40 MG tablet Take 1 tablet (40 mg total) by mouth daily.  30 tablet  11  . simvastatin (ZOCOR) 40 MG tablet Take 1 tablet (40 mg total) by mouth at bedtime.  30 tablet  11   No current facility-administered medications  for this visit.    Past Medical History  Diagnosis Date  . Hypertension     a. Dx 3y ago.  [name] Hyperlipidemia     a. Dx 3y ago - not on statin.  [name] GERD (gastroesophageal reflux disease)   . Hypothyroidism     a. on replacement.  . Obesity   . Snoring   . Chest pain     a. 08/2013 Cardia CTA: Ca score 238 (95%), LM nl, LAD mod stenosis in D2 area, D1 mild ost stenosis, D2 no signif dzs, LCX small, RCA nl.  . Type II diabetes mellitus   . Arthritis     "joints; from where I've had surgeries" (08/17/2013)  . Coronary artery disease     [facility] (08/18/13):  pLAD 50, CFX and RCA normal.  EF 55-65%.  LAD lesion FFR:  0.88 (normal).  Med rx recommended.    . Sleep apnea     Past Surgical History  Procedure Laterality Date  . Shoulder arthroscopy w/ rotator cuff repair Left   . Knee arthroscopy Right X 2  . Ankle surgery Right     "had a growth in it; cut it out" (08/17/2013)  . Cholecystectomy      ROS:  As stated in the HPI and negative for all other systems.  PHYSICAL EXAM BP 150/80  Pulse 95  Ht 5\' 8"  (1.727 m)  Wt 311 lb (141.069 kg)  BMI 47.30 kg/m2 GENERAL:  Well appearing NECK:  No jugular venous distention, waveform within normal limits, carotid upstroke brisk and symmetric, no bruits, no thyromegaly LYMPHATICS:  No cervical, inguinal adenopathy LUNGS:  Clear to auscultation bilaterally BACK:  No CVA tenderness CHEST:  Unremarkable HEART:  PMI not displaced or sustained,S1 and S2 within normal limits, no S3, no S4, no clicks, no rubs, no murmurs ABD:  Flat, positive bowel sounds normal in frequency in pitch, no bruits, no rebound, no guarding, no midline pulsatile mass, no hepatomegaly, no splenomegaly, obese EXT:  2 plus pulses throughout, no edema, no cyanosis no clubbing   EKG: NSR, rate 96, LAD, intervals WNL, no acute ST T wave changes.    ASSESSMENT AND PLAN  CAD:  The patient has no new sypmtoms.  No further cardiovascular testing is indicated.  We will  continue with aggressive risk reduction and meds as listed.  GERD:  This is improved and I will continue with Protonix.  Snoring:  He does have sleep apnea but might have trouble affording CPAP.  He is being seen by Dr. Halford Chessman  Hypertension:  His BP is slightly elevated but this is unusual and I have reviewed the recent readings.  No change in therapy is indicated.   Hyperlipidemia:  His lipids most recently were excellent.  He will continue the meds as listed.    Diabetes Mellitus:  F/u with primary care.  Obesity:  We discussed the importance of weight loss and different strategies.    Hypothyroidism:  I will check a TSH given his slightly abnormal level previously and difficulty losing weight.

## 2014-04-05 NOTE — Telephone Encounter (Signed)
I have talked to [place] about this pts TSH

## 2014-04-11 ENCOUNTER — Other Ambulatory Visit: Payer: Self-pay | Admitting: *Deleted

## 2014-04-11 DIAGNOSIS — R5381 Other malaise: Secondary | ICD-10-CM

## 2014-04-11 DIAGNOSIS — R5383 Other fatigue: Principal | ICD-10-CM

## 2014-06-30 ENCOUNTER — Ambulatory Visit: Payer: Self-pay | Admitting: Pulmonary Disease

## 2014-07-24 ENCOUNTER — Encounter (HOSPITAL_COMMUNITY): Payer: Self-pay

## 2014-07-24 ENCOUNTER — Emergency Department (HOSPITAL_COMMUNITY): Payer: Self-pay

## 2014-07-24 ENCOUNTER — Emergency Department (HOSPITAL_COMMUNITY)
Admission: EM | Admit: 2014-07-24 | Discharge: 2014-07-24 | Disposition: A | Discharge status: Home / Self Care | Payer: Self-pay | Attending: Emergency Medicine | Admitting: Emergency Medicine

## 2014-07-24 DIAGNOSIS — Z79899 Other long term (current) drug therapy: Secondary | ICD-10-CM | POA: Insufficient documentation

## 2014-07-24 DIAGNOSIS — Z8669 Personal history of other diseases of the nervous system and sense organs: Secondary | ICD-10-CM | POA: Insufficient documentation

## 2014-07-24 DIAGNOSIS — R072 Precordial pain: Secondary | ICD-10-CM | POA: Insufficient documentation

## 2014-07-24 DIAGNOSIS — Z7982 Long term (current) use of aspirin: Secondary | ICD-10-CM | POA: Insufficient documentation

## 2014-07-24 DIAGNOSIS — R079 Chest pain, unspecified: Secondary | ICD-10-CM

## 2014-07-24 DIAGNOSIS — E119 Type 2 diabetes mellitus without complications: Secondary | ICD-10-CM | POA: Insufficient documentation

## 2014-07-24 DIAGNOSIS — I2510 Atherosclerotic heart disease of native coronary artery without angina pectoris: Secondary | ICD-10-CM | POA: Insufficient documentation

## 2014-07-24 DIAGNOSIS — M1990 Unspecified osteoarthritis, unspecified site: Secondary | ICD-10-CM | POA: Insufficient documentation

## 2014-07-24 DIAGNOSIS — I10 Essential (primary) hypertension: Secondary | ICD-10-CM | POA: Insufficient documentation

## 2014-07-24 DIAGNOSIS — E669 Obesity, unspecified: Secondary | ICD-10-CM | POA: Insufficient documentation

## 2014-07-24 DIAGNOSIS — E785 Hyperlipidemia, unspecified: Secondary | ICD-10-CM | POA: Insufficient documentation

## 2014-07-24 DIAGNOSIS — Z87891 Personal history of nicotine dependence: Secondary | ICD-10-CM | POA: Insufficient documentation

## 2014-07-24 DIAGNOSIS — E039 Hypothyroidism, unspecified: Secondary | ICD-10-CM | POA: Insufficient documentation

## 2014-07-24 LAB — BASIC METABOLIC PANEL
Anion gap: 17 — ABNORMAL HIGH (ref 5–15)
BUN: 9 mg/dL (ref 6–23)
CALCIUM: 9.3 mg/dL (ref 8.4–10.5)
CHLORIDE: 99 meq/L (ref 96–112)
CO2: 23 mEq/L (ref 19–32)
Creatinine, Ser: 1.17 mg/dL (ref 0.50–1.35)
GFR calc Af Amer: 84 mL/min — ABNORMAL LOW (ref >90)
GFR calc non Af Amer: 73 mL/min — ABNORMAL LOW (ref >90)
GLUCOSE: 116 mg/dL — AB (ref 70–99)
Potassium: 4 mEq/L (ref 3.7–5.3)
Sodium: 139 mEq/L (ref 137–147)

## 2014-07-24 LAB — I-STAT TROPONIN, ED
Troponin i, poc: 0.01 ng/mL (ref 0.00–0.08)
Troponin i, poc: 0.01 ng/mL (ref 0.00–0.08)

## 2014-07-24 LAB — CBC
HCT: 44.1 % (ref 39.0–52.0)
HEMOGLOBIN: 14.9 g/dL (ref 13.0–17.0)
MCH: 29.2 pg (ref 26.0–34.0)
MCHC: 33.8 g/dL (ref 30.0–36.0)
MCV: 86.3 fL (ref 78.0–100.0)
Platelets: 242 10*3/uL (ref 150–400)
RBC: 5.11 MIL/uL (ref 4.22–5.81)
RDW: 13.1 % (ref 11.5–15.5)
WBC: 7.7 10*3/uL (ref 4.0–10.5)

## 2014-07-24 MED ORDER — GI COCKTAIL ~~LOC~~
30.0000 mL | Freq: Once | ORAL | Status: AC
  Administered 2014-07-24: 30 mL via ORAL
  Filled 2014-07-24: qty 30

## 2014-07-24 NOTE — ED Notes (Signed)
Discharge instructions reviewed with pt. Pt verbalized understanding.   

## 2014-07-24 NOTE — ED Provider Notes (Signed)
CSN: [id number]     Arrival date & time 07/24/14  1441 History   First MD Initiated Contact with Patient 07/24/14 1515     Chief Complaint  Patient presents with  . Chest Pain     (Consider location/radiation/quality/duration/timing/severity/associated sxs/prior Treatment) The history is provided by the patient and medical records.    This is a 48 year old male with past medical history significant for hypertension, diabetes, hyperlipidemia, GERD, hypothyroidism, sleep apnea, coronary artery disease managed medically, presenting to the ED for chest pain. Patient states pain began yesterday, localized to his midsternal region with some radiation to the left side of his chest. He describes pain as a constant burning sensation.  He notes some SOB at night, he does have sleep apnea.  States earlier today he was having some paresthesias of left arm and left foot, does note this happens occasionally due to his diabetes.  He denies nausea, vomiting, diaphoresis, numbness, weakness of extremities.  Patient did have a cold a few weeks ago with productive cough but that has since resolved.  Denies recent fever, chills.  Patient is not a smoker.  He has been working on weight loss recently and has been attempting to walk daily, approx 5 miles when able but has not done this in the past 2 weeks.  Cath on 2014 by Dr. Martinique--  Stenosis of proximal LAD, recommended medical management.  Patient has already has ASA today.  Past Medical History  Diagnosis Date  . Hypertension     a. Dx 3y ago.  [name] Hyperlipidemia     a. Dx 3y ago - not on statin.  [name] GERD (gastroesophageal reflux disease)   . Hypothyroidism     a. on replacement.  . Obesity   . Snoring   . Chest pain     a. 08/2013 Cardia CTA: Ca score 238 (95%), LM nl, LAD mod stenosis in D2 area, D1 mild ost stenosis, D2 no signif dzs, LCX small, RCA nl.  . Type II diabetes mellitus   . Arthritis     "joints; from where I've had surgeries" (08/17/2013)   . Coronary artery disease     LHC (08/18/13):  pLAD 50, CFX and RCA normal.  EF 55-65%.  LAD lesion FFR:  0.88 (normal).  Med rx recommended.    . Sleep apnea    Past Surgical History  Procedure Laterality Date  . Shoulder arthroscopy w/ rotator cuff repair Left   . Knee arthroscopy Right X 2  . Ankle surgery Right     "had a growth in it; cut it out" (08/17/2013)  . Cholecystectomy     Family History  Problem Relation Age of Onset  . Adopted: Yes  . Coronary artery disease Father     s/p MI in his early 30's->CABG x 5 @ age 40, currently 31.  . Diabetes Father   . Stroke Father   . Hypertension Father   . Hypertension Mother   . Obesity Mother   . Diabetes Mother   . Diabetes Brother   . Obesity Brother   . Asthma Maternal Grandfather    History  Substance Use Topics  . Smoking status: Former Smoker -- 1.50 packs/day for 24 years    Types: Cigarettes    Quit date: 09/10/2007  . Smokeless tobacco: Never Used  . Alcohol Use: No    Review of Systems  Cardiovascular: Positive for chest pain (burning).  All other systems reviewed and are negative.     Allergies  Review of patient's allergies indicates no known allergies.  Home Medications   Prior to Admission medications   Medication Sig Start Date End Date Taking? Authorizing Provider  acetaminophen (TYLENOL) 500 MG tablet Take 1,000 mg by mouth every 6 (six) hours as needed for mild pain or moderate pain.    Historical Provider, MD  aspirin EC 81 MG EC tablet Take 1 tablet (81 mg total) by mouth daily. 08/10/13   Rogelia Mire, NP  ibuprofen (ADVIL,MOTRIN) 200 MG tablet Take 400 mg by mouth every 6 (six) hours as needed for mild pain or moderate pain.    Historical Provider, MD  levothyroxine (SYNTHROID, LEVOTHROID) 100 MCG tablet Take 1 tablet (100 mcg total) by mouth daily. 04/04/14   Minus Breeding, MD  losartan (COZAAR) 100 MG tablet Take 1 tablet (100 mg total) by mouth daily. 04/04/14   Minus Breeding, MD   metFORMIN (GLUCOPHAGE) 500 MG tablet TAKE 1 TABLET BY MOUTH 2 TIMES DAILY WITH A MEAL. 03/02/14   Minus Breeding, MD  Multiple Vitamins-Minerals (MULTIVITAMIN,TX-MINERALS) tablet Take 1 tablet by mouth daily.      Historical Provider, MD  nitroGLYCERIN (NITROSTAT) 0.4 MG SL tablet Place 1 tablet (0.4 mg total) under the tongue every 5 (five) minutes as needed for chest pain (CP or SOB). 08/10/13   Rogelia Mire, NP  pantoprazole (PROTONIX) 40 MG tablet Take 1 tablet (40 mg total) by mouth daily. 08/26/13   Liliane Shi, PA-C  simvastatin (ZOCOR) 40 MG tablet Take 1 tablet (40 mg total) by mouth at bedtime. 11/25/13   Liliane Shi, PA-C   BP 122/81 mmHg  Pulse 99  Temp(Src) 98.1 F (36.7 C) (Oral)  Resp 18  Ht 5\' 8"  (1.727 m)  Wt 295 lb (133.811 kg)  BMI 44.86 kg/m2  SpO2 97%   Physical Exam  Constitutional: He is oriented to person, place, and time. He appears well-developed and well-nourished. No distress.  obese  HENT:  Head: Normocephalic and atraumatic.  Mouth/Throat: Oropharynx is clear and moist.  Eyes: Conjunctivae and EOM are normal. Pupils are equal, round, and reactive to light.  Neck: Normal range of motion. Neck supple.  Cardiovascular: Normal rate, regular rhythm and normal heart sounds.   Pulmonary/Chest: Effort normal and breath sounds normal. No respiratory distress. He has no wheezes.  Abdominal: Soft. Bowel sounds are normal. There is no tenderness. There is no guarding.  Musculoskeletal: Normal range of motion. He exhibits no edema.  Neurological: He is alert and oriented to person, place, and time.  Skin: Skin is warm and dry. He is not diaphoretic.  Psychiatric: He has a normal mood and affect.  Nursing note and vitals reviewed.   ED Course  Procedures (including critical care time) Labs Review Labs Reviewed  BASIC METABOLIC PANEL - Abnormal; Notable for the following:    Glucose, Bld 116 (*)    GFR calc non Af Amer 73 (*)    GFR calc Af Amer 84  (*)    Anion gap 17 (*)    All other components within normal limits  CBC  I-STAT TROPOININ, ED    Imaging Review Dg Chest 2 View  07/24/2014   CLINICAL DATA:  Cough and chest pain with shortness of breath  EXAM: CHEST  2 VIEW  COMPARISON:  08/17/2013 and prior chest radiographs  FINDINGS: The cardiomediastinal silhouette is unremarkable.  There is no evidence of focal airspace disease, pulmonary edema, suspicious pulmonary nodule/mass, pleural effusion, or pneumothorax. No acute bony  abnormalities are identified.  IMPRESSION: No active cardiopulmonary disease.   Electronically Signed   By: Hassan Rowan M.D.   On: 07/24/2014 16:20     EKG Interpretation None      MDM   Final diagnoses:  Chest pain   48 year old male with midsternal and burning left-sided chest pain for the past 2 days.  EKG NSR with non-specific ST changes, unchanged from previous.  Notes SOB at night, patient does have sleep apnea.  Trop negative.  Labs reassuring.  CXR clear.  Patient given GI cocktail with improvement of pain.  Patient's pain is non-specific although he does have significant risk factors, will monitor and obtain delta trop.  Delta troponin remains unchanged from previous.  Patient currently without any chest pain.  Sx possibly related to his GERD given relief with GI cocktail.  Encouraged FU with cardiologist, Dr. Percival Spanish.  Discussed plan with patient, he/she acknowledged understanding and agreed with plan of care.  Return precautions given for new or worsening symptoms.  Case discussed with attending physician, Dr. Venora Maples, who agrees with assessment and plan of care.  Larene Pickett, PA-C 07/24/14 [place], MD 07/27/14 [phone]

## 2014-07-24 NOTE — Discharge Instructions (Signed)
Continue all regular medications as directed. Follow-up with Dr. Percival Spanish-- call their office tomorrow and inform of this ED visit. Return to the ED for new concerns.

## 2014-07-24 NOTE — ED Notes (Signed)
Pt states he was having mid cp since yesterday, no nausea or vomiting. Reports pain as burning, left side pain, left hand tingling and left foot tingling.

## 2014-08-18 ENCOUNTER — Encounter (HOSPITAL_COMMUNITY): Payer: Self-pay | Admitting: Cardiology

## 2015-01-29 ENCOUNTER — Other Ambulatory Visit: Payer: Self-pay | Admitting: Cardiology

## 2015-01-30 NOTE — Telephone Encounter (Signed)
Ok to refill 

## 2015-01-31 ENCOUNTER — Other Ambulatory Visit: Payer: Self-pay | Admitting: *Deleted

## 2015-01-31 MED ORDER — LEVOTHYROXINE SODIUM 100 MCG PO TABS: 100.0000 ug | ORAL_TABLET | Freq: Every day | ORAL | Status: DC

## 2015-02-01 ENCOUNTER — Ambulatory Visit (INDEPENDENT_AMBULATORY_CARE_PROVIDER_SITE_OTHER): Payer: Self-pay | Admitting: Family Medicine

## 2015-02-01 ENCOUNTER — Encounter: Payer: Self-pay | Admitting: Family Medicine

## 2015-02-01 VITALS — BP 110/70 | HR 91 | Temp 98.3°F | Wt 309.0 lb

## 2015-02-01 DIAGNOSIS — E039 Hypothyroidism, unspecified: Secondary | ICD-10-CM

## 2015-02-01 DIAGNOSIS — K219 Gastro-esophageal reflux disease without esophagitis: Secondary | ICD-10-CM | POA: Insufficient documentation

## 2015-02-01 DIAGNOSIS — Z7251 High risk heterosexual behavior: Secondary | ICD-10-CM

## 2015-02-01 DIAGNOSIS — I10 Essential (primary) hypertension: Secondary | ICD-10-CM

## 2015-02-01 DIAGNOSIS — E119 Type 2 diabetes mellitus without complications: Secondary | ICD-10-CM

## 2015-02-01 LAB — HEMOGLOBIN A1C: HEMOGLOBIN A1C: 7 % — AB (ref 4.6–6.5)

## 2015-02-01 LAB — TSH: TSH: 5.42 u[IU]/mL — ABNORMAL HIGH (ref 0.35–4.50)

## 2015-02-01 MED ORDER — LEVOTHYROXINE SODIUM 100 MCG PO TABS: 100.0000 ug | ORAL_TABLET | Freq: Every day | ORAL | Status: DC

## 2015-02-01 MED ORDER — SIMVASTATIN 40 MG PO TABS: 40.0000 mg | ORAL_TABLET | Freq: Every day | ORAL | Status: DC

## 2015-02-01 MED ORDER — METFORMIN HCL 500 MG PO TABS
ORAL_TABLET | ORAL | Status: DC
Start: 2015-02-01 — End: 2015-09-28

## 2015-02-01 MED ORDER — PANTOPRAZOLE SODIUM 40 MG PO TBEC: 40.0000 mg | DELAYED_RELEASE_TABLET | Freq: Every day | ORAL | Status: DC

## 2015-02-01 MED ORDER — LOSARTAN POTASSIUM 100 MG PO TABS: 100.0000 mg | ORAL_TABLET | Freq: Every day | ORAL | Status: DC

## 2015-02-01 MED ORDER — NITROGLYCERIN 0.4 MG SL SUBL: 0.4000 mg | SUBLINGUAL_TABLET | SUBLINGUAL | Status: DC | PRN

## 2015-02-01 NOTE — Progress Notes (Signed)
Garret Reddish, MD  Subjective:  Gabriel [name] is a 49 y.o. year old very pleasant male patient who presents with:  DIABETES Type II-controlled in past on metformin 500mg  BID, long time since a1c check  Lab Results  Component Value Date   HGBA1C 6.1* 08/17/2013   HGBA1C 6.8* 06/19/2011   Medications taking and tolerating-yes Blood Sugars per patient-fasting- Diet-poor, overeats Regular Exercise-no, advised of need On Aspirin-yes On statin-yes Daily foot monitoring-yes  ROS- Denies  Vision changes, feet or hand numbness/pain/tingling. Denies  Hypoglycemia symptoms (shaky, sweaty, hungry, weak anxious, tremor, palpitations, confusion, behavior change).   Hypertension-controlled today  BP Readings from Last 3 Encounters:  02/01/15 110/70  07/24/14 117/71  04/04/14 150/80   Home BP monitoring-no Compliant with medications-yes without side effects ROS-Denies any CP, HA, SOB, blurry vision  Hypothyroidism-poor control last check and has been off meds for at least a week as ran otu  Lab Results  Component Value Date   TSH 6.830* 04/04/2014   On thyroid medication-levothyroxine 126mcg in past ROS-No hair or nail changes. No heat/cold intolerance. No constipation or diarrhea. Denies shakiness or anxiety.  Past Medical History- CAD, HLD, orbid obesity, GERD  Medications- reviewed and updated Current Outpatient Prescriptions  Medication Sig Dispense Refill  . aspirin EC 81 MG EC tablet Take 1 tablet (81 mg total) by mouth daily.    [name] levothyroxine (SYNTHROID, LEVOTHROID) 100 MCG tablet Take 1 tablet (100 mcg total) by mouth daily. 30 tablet 6  . losartan (COZAAR) 100 MG tablet Take 1 tablet (100 mg total) by mouth daily. 30 tablet 6  . metFORMIN (GLUCOPHAGE) 500 MG tablet TAKE 1 TABLET BY MOUTH 2 TIMES DAILY WITH A MEAL. 60 tablet 2  . Multiple Vitamins-Minerals (MULTIVITAMIN,TX-MINERALS) tablet Take 1 tablet by mouth daily.      . pantoprazole (PROTONIX) 40 MG tablet Take 1  tablet (40 mg total) by mouth daily. 30 tablet 11  . simvastatin (ZOCOR) 40 MG tablet Take 1 tablet (40 mg total) by mouth at bedtime. 30 tablet 11  . acetaminophen (TYLENOL) 500 MG tablet Take 1,000 mg by mouth every 6 (six) hours as needed for mild pain or moderate pain.    [name] ibuprofen (ADVIL,MOTRIN) 200 MG tablet Take 400 mg by mouth every 6 (six) hours as needed for mild pain or moderate pain.    . nitroGLYCERIN (NITROSTAT) 0.4 MG SL tablet Place 1 tablet (0.4 mg total) under the tongue every 5 (five) minutes as needed for chest pain (CP or SOB). (Patient not taking: Reported on 02/01/2015) 25 tablet 3   No current facility-administered medications for this visit.    Objective: BP 110/70 mmHg  Pulse 91  Temp(Src) 98.3 F (36.8 C)  Wt 309 lb (140.161 kg) Gen: NAD, resting comfortably, morbidly obese CV: RRR no murmurs rubs or gallops Lungs: CTAB no crackles, wheeze, rhonchi Abdomen: soft/nontender/nondistended/normal bowel sounds.  Ext: no edema Skin: warm, dry, no rash Neuro: grossly normal, moves all extremities  Diabetic Foot Exam - Simple   Simple Foot Form  Diabetic Foot exam was performed with the following findings:  Yes 02/01/2015 10:27 AM  Visual Inspection  No deformities, no ulcerations, no other skin breakdown bilaterally:  Yes  Sensation Testing  Intact to touch and monofilament testing bilaterally:  Yes  Pulse Check  Posterior Tibialis and Dorsalis pulse intact bilaterally:  Yes  Comments      Assessment/Plan:  Diabetes mellitus Controlled on Metformin 500 BID in past. Refilled meds. Check a1c. Hopeful  at least <8   HTN (hypertension) Controlled on Losartan 100mg , continue   Hypothyroidism Check TSH, if over 10, increase dose. If under this, likely repeat at visit in 4 months given has been off for a week.    4 months follow up.   Orders Placed This Encounter  Procedures  . Hemoglobin A1c  . TSH    [facility]  . HIV antibody    [facility]    Meds  ordered this encounter  Medications  . simvastatin (ZOCOR) 40 MG tablet    Sig: Take 1 tablet (40 mg total) by mouth at bedtime.    Dispense:  30 tablet    Refill:  11  . pantoprazole (PROTONIX) 40 MG tablet    Sig: Take 1 tablet (40 mg total) by mouth daily.    Dispense:  30 tablet    Refill:  11  . nitroGLYCERIN (NITROSTAT) 0.4 MG SL tablet    Sig: Place 1 tablet (0.4 mg total) under the tongue every 5 (five) minutes as needed for chest pain (CP or SOB).    Dispense:  25 tablet    Refill:  3  . metFORMIN (GLUCOPHAGE) 500 MG tablet    Sig: TAKE 1 TABLET BY MOUTH 2 TIMES DAILY WITH A MEAL.    Dispense:  60 tablet    Refill:  11  . losartan (COZAAR) 100 MG tablet    Sig: Take 1 tablet (100 mg total) by mouth daily.    Dispense:  30 tablet    Refill:  11  . levothyroxine (SYNTHROID, LEVOTHROID) 100 MCG tablet    Sig: Take 1 tablet (100 mcg total) by mouth daily.    Dispense:  30 tablet    Refill:  11

## 2015-02-01 NOTE — Patient Instructions (Addendum)
Get eye exam scheduled and have results faxed to [place] at [phone]  Health Maintenance Due  Topic Date Due  . FOOT EXAM - today 04/21/1977  . OPHTHALMOLOGY EXAM - get scheduled 04/21/1977  . HIV Screening - today 04/21/1982  . HEMOGLOBIN A1C - today 02/15/2014   Refilled meds  Labs today  Follow up 4 months (or sooner if labs show we need to check in sooner)

## 2015-02-01 NOTE — Assessment & Plan Note (Signed)
Controlled on Metformin 500 BID in past. Refilled meds. Check a1c. Hopeful at least <8

## 2015-02-01 NOTE — Assessment & Plan Note (Signed)
Check TSH, if over 10, increase dose. If under this, likely repeat at visit in 4 months given has been off for a week.

## 2015-02-01 NOTE — Assessment & Plan Note (Signed)
Controlled on Losartan 100mg , continue

## 2015-02-02 LAB — HIV ANTIBODY (ROUTINE TESTING W REFLEX): HIV: NONREACTIVE

## 2015-05-25 ENCOUNTER — Ambulatory Visit: Payer: Self-pay | Admitting: Family Medicine

## 2015-08-04 ENCOUNTER — Emergency Department (HOSPITAL_COMMUNITY)
Admission: EM | Admit: 2015-08-04 | Discharge: 2015-08-04 | Disposition: A | Discharge status: Home / Self Care | Payer: Self-pay | Attending: Emergency Medicine | Admitting: Emergency Medicine

## 2015-08-04 ENCOUNTER — Other Ambulatory Visit: Payer: Self-pay | Admitting: Physician Assistant

## 2015-08-04 ENCOUNTER — Emergency Department (HOSPITAL_COMMUNITY): Payer: Self-pay

## 2015-08-04 ENCOUNTER — Encounter (HOSPITAL_COMMUNITY): Payer: Self-pay | Admitting: Emergency Medicine

## 2015-08-04 DIAGNOSIS — K219 Gastro-esophageal reflux disease without esophagitis: Secondary | ICD-10-CM | POA: Insufficient documentation

## 2015-08-04 DIAGNOSIS — E785 Hyperlipidemia, unspecified: Secondary | ICD-10-CM

## 2015-08-04 DIAGNOSIS — R140 Abdominal distension (gaseous): Secondary | ICD-10-CM

## 2015-08-04 DIAGNOSIS — Z87891 Personal history of nicotine dependence: Secondary | ICD-10-CM | POA: Insufficient documentation

## 2015-08-04 DIAGNOSIS — R109 Unspecified abdominal pain: Secondary | ICD-10-CM

## 2015-08-04 DIAGNOSIS — Z7984 Long term (current) use of oral hypoglycemic drugs: Secondary | ICD-10-CM | POA: Insufficient documentation

## 2015-08-04 DIAGNOSIS — K5900 Constipation, unspecified: Secondary | ICD-10-CM | POA: Insufficient documentation

## 2015-08-04 DIAGNOSIS — E669 Obesity, unspecified: Secondary | ICD-10-CM | POA: Insufficient documentation

## 2015-08-04 DIAGNOSIS — Z79899 Other long term (current) drug therapy: Secondary | ICD-10-CM | POA: Insufficient documentation

## 2015-08-04 DIAGNOSIS — R0789 Other chest pain: Secondary | ICD-10-CM

## 2015-08-04 DIAGNOSIS — M1990 Unspecified osteoarthritis, unspecified site: Secondary | ICD-10-CM | POA: Insufficient documentation

## 2015-08-04 DIAGNOSIS — E039 Hypothyroidism, unspecified: Secondary | ICD-10-CM | POA: Insufficient documentation

## 2015-08-04 DIAGNOSIS — R079 Chest pain, unspecified: Secondary | ICD-10-CM

## 2015-08-04 DIAGNOSIS — E119 Type 2 diabetes mellitus without complications: Secondary | ICD-10-CM

## 2015-08-04 DIAGNOSIS — I2510 Atherosclerotic heart disease of native coronary artery without angina pectoris: Secondary | ICD-10-CM | POA: Insufficient documentation

## 2015-08-04 DIAGNOSIS — Z9889 Other specified postprocedural states: Secondary | ICD-10-CM | POA: Insufficient documentation

## 2015-08-04 DIAGNOSIS — Z7982 Long term (current) use of aspirin: Secondary | ICD-10-CM | POA: Insufficient documentation

## 2015-08-04 DIAGNOSIS — E1169 Type 2 diabetes mellitus with other specified complication: Secondary | ICD-10-CM | POA: Diagnosis present

## 2015-08-04 DIAGNOSIS — Z8669 Personal history of other diseases of the nervous system and sense organs: Secondary | ICD-10-CM | POA: Insufficient documentation

## 2015-08-04 DIAGNOSIS — I10 Essential (primary) hypertension: Secondary | ICD-10-CM | POA: Diagnosis present

## 2015-08-04 DIAGNOSIS — E1159 Type 2 diabetes mellitus with other circulatory complications: Secondary | ICD-10-CM | POA: Diagnosis present

## 2015-08-04 HISTORY — DX: Dysphagia, unspecified: R13.10

## 2015-08-04 LAB — BASIC METABOLIC PANEL
ANION GAP: 8 (ref 5–15)
BUN: 10 mg/dL (ref 6–20)
CO2: 27 mmol/L (ref 22–32)
Calcium: 8.9 mg/dL (ref 8.9–10.3)
Chloride: 102 mmol/L (ref 101–111)
Creatinine, Ser: 1.06 mg/dL (ref 0.61–1.24)
Glucose, Bld: 99 mg/dL (ref 65–99)
POTASSIUM: 3.4 mmol/L — AB (ref 3.5–5.1)
SODIUM: 137 mmol/L (ref 135–145)

## 2015-08-04 LAB — CBC
HEMATOCRIT: 42.6 % (ref 39.0–52.0)
Hemoglobin: 13.7 g/dL (ref 13.0–17.0)
MCH: 28.3 pg (ref 26.0–34.0)
MCHC: 32.2 g/dL (ref 30.0–36.0)
MCV: 88 fL (ref 78.0–100.0)
Platelets: 241 10*3/uL (ref 150–400)
RBC: 4.84 MIL/uL (ref 4.22–5.81)
RDW: 13.4 % (ref 11.5–15.5)
WBC: 7.8 10*3/uL (ref 4.0–10.5)

## 2015-08-04 LAB — HEPATIC FUNCTION PANEL
ALT: 43 U/L (ref 17–63)
AST: 26 U/L (ref 15–41)
Albumin: 3.9 g/dL (ref 3.5–5.0)
Alkaline Phosphatase: 70 U/L (ref 38–126)
Bilirubin, Direct: <0.1 mg/dL — ABNORMAL LOW (ref 0.1–0.5)
TOTAL PROTEIN: 6.6 g/dL (ref 6.5–8.1)
Total Bilirubin: 0.9 mg/dL (ref 0.3–1.2)

## 2015-08-04 LAB — I-STAT TROPONIN, ED
TROPONIN I, POC: 0 ng/mL (ref 0.00–0.08)
Troponin i, poc: 0 ng/mL (ref 0.00–0.08)

## 2015-08-04 LAB — URINALYSIS, ROUTINE W REFLEX MICROSCOPIC
Bilirubin Urine: NEGATIVE
Glucose, UA: NEGATIVE mg/dL
HGB URINE DIPSTICK: NEGATIVE
Ketones, ur: NEGATIVE mg/dL
Leukocytes, UA: NEGATIVE
Nitrite: NEGATIVE
PROTEIN: NEGATIVE mg/dL
Specific Gravity, Urine: 1.024 (ref 1.005–1.030)
pH: 5 (ref 5.0–8.0)

## 2015-08-04 LAB — LIPASE, BLOOD: LIPASE: 38 U/L (ref 11–51)

## 2015-08-04 NOTE — Consult Note (Signed)
CARDIOLOGY CONSULT NOTE   Patient ID: Gabriel [name] MRN: [id number] DOB/AGE: 1965/11/05 49 y.o.  Admit date: 08/04/2015  Primary Physician   Garret Reddish, MD Primary Cardiologist  Dr. Percival Spanish Reason for Consultation  Chest discomfort  HPI: Gabriel [name] is a 49 y.o. male with a history of CAD, HTN, HL, hypothyroidism, sleep apnea noncompliant CPAP, GERD, cholecystectomy and DM who came to ED yesterday for evaluation of abdominal bloating and chest discomfort.   LHC (08/18/13) showed  pLAD 50%, CFX and RCA normal.  EF 55-65%.  LAD lesion FFR:  0.88 (normal).  Med rx recommended.  He was doing well on cardiac cardiac stand point when last seen by Dr. Percival Spanish. At that time advise for risk stratification.  And states that he does have a history of dysphagia to solid foods. Upper endoscopy 11/2013 - without obvious stricture s/p Maloney dilation. Follows Dr. Deatra Ina at [place]. Seen her note and per patient April 2015 who has recommended bariatric surgery. Patient tries to watch his diet. He is used to walk 10 miles a day during summer. However, unable to do this for the past 3 months as he is taking care of his mom. She is going through chemotherapy for cancer.  Yesterday 11/24 he had developed an abdominal discomfort with bloating sensation 2 hours after eating lunch. His pain started when he tried to lay down. Afterward he developed a chest discomfort and loss of burping along with chest discomfort. He took antacid and cinnamon candy with relief of gas. He also has some right-sided abdominal discomfort. He also admits to having bilateral arm achy pain that is different from his abdominal discomfort. Admits to having recent constipation for the past few days. He states that he is intermittently he also takes antacid for uncontrolled GERD with relief.   In Ed his CXR clear. POC trop negative. EKG non ischemic. Lytes normal except K of 3.4. Currently he denies chest discomfort, however  complains of lower abdominal pain and bloating. Pending Lipase.   He states at approximately 1 month ago he had a substernal chest pressure while laying that was resolved with sublingual nitroglycerin that he took first time in a many months. No episodes since then.  Past Medical History  Diagnosis Date  . Hypertension     a. Dx 3y ago.  [name] Hyperlipidemia     a. Dx 3y ago - not on statin.  [name] GERD (gastroesophageal reflux disease)   . Hypothyroidism     a. on replacement.  . Obesity   . Snoring   . Chest pain     a. 08/2013 Cardia CTA: Ca score 238 (95%), LM nl, LAD mod stenosis in D2 area, D1 mild ost stenosis, D2 no signif dzs, LCX small, RCA nl.  . Type II diabetes mellitus ([place])   . Arthritis     "joints; from where I've had surgeries" (08/17/2013)  . Coronary artery disease     LHC (08/18/13):  pLAD 50, CFX and RCA normal.  EF 55-65%.  LAD lesion FFR:  0.88 (normal).  Med rx recommended.    . Sleep apnea      Past Surgical History  Procedure Laterality Date  . Shoulder arthroscopy w/ rotator cuff repair Left   . Knee arthroscopy Right X 2  . Ankle surgery Right     "had a growth in it; cut it out" (08/17/2013)  . Cholecystectomy    . Left heart catheterization with coronary angiogram N/A 08/18/2013  Procedure: LEFT HEART CATHETERIZATION WITH CORONARY ANGIOGRAM;  Surgeon: Peter M Martinique, MD;  Location: [facility] CATH LAB;  Service: Cardiovascular;  Laterality: N/A;    No Known Allergies  I have reviewed the patient's current medications     Prior to Admission medications   Medication Sig Start Date End Date Taking? Authorizing Provider  acetaminophen (TYLENOL) 500 MG tablet Take 1,000 mg by mouth every 6 (six) hours as needed for mild pain or moderate pain.   Yes Historical Provider, MD  aspirin EC 81 MG EC tablet Take 1 tablet (81 mg total) by mouth daily. 08/10/13  Yes Rogelia Mire, NP  levothyroxine (SYNTHROID, LEVOTHROID) 100 MCG tablet Take 1 tablet (100 mcg  total) by mouth daily. 02/01/15  Yes Marin Olp, MD  losartan (COZAAR) 100 MG tablet Take 1 tablet (100 mg total) by mouth daily. 02/01/15  Yes Marin Olp, MD  metFORMIN (GLUCOPHAGE) 500 MG tablet TAKE 1 TABLET BY MOUTH 2 TIMES DAILY WITH A MEAL. Patient taking differently: Take 500 mg by mouth 2 (two) times daily with a meal.  02/01/15  Yes Marin Olp, MD  Multiple Vitamins-Minerals (MULTIVITAMIN,TX-MINERALS) tablet Take 1 tablet by mouth daily.     Yes Historical Provider, MD  pantoprazole (PROTONIX) 40 MG tablet Take 1 tablet (40 mg total) by mouth daily. 02/01/15  Yes Marin Olp, MD  simvastatin (ZOCOR) 40 MG tablet Take 1 tablet (40 mg total) by mouth at bedtime. 02/01/15  Yes Marin Olp, MD  ibuprofen (ADVIL,MOTRIN) 200 MG tablet Take 400 mg by mouth every 6 (six) hours as needed for mild pain or moderate pain.    Historical Provider, MD  nitroGLYCERIN (NITROSTAT) 0.4 MG SL tablet Place 1 tablet (0.4 mg total) under the tongue every 5 (five) minutes as needed for chest pain (CP or SOB). 02/01/15   Marin Olp, MD     Social History   Social History  . Marital Status: Single    Spouse Name: N/A  . Number of Children: 0  . Years of Education: N/A   Occupational History  . Unemployed    Social History Main Topics  . Smoking status: Former Smoker -- 1.50 packs/day for 24 years    Types: Cigarettes    Quit date: 09/10/2007  . Smokeless tobacco: Never Used  . Alcohol Use: No  . Drug Use: No  . Sexual Activity: Yes   Other Topics Concern  . Not on file   Social History Narrative   Lives in [place] with parents (cares for parents).  Never married. Unemployed.     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 Mother Alive     Hx CAD  . Father Alive     No Hx CAD   Family History  Problem Relation Age of Onset  . Adopted: Yes  . Coronary artery disease  Father     s/p MI in his early 30's->CABG x 5 @ age 12, currently 14.  . Diabetes Father   . Stroke Father   . Hypertension Father   . Hypertension Mother   . Obesity Mother   . Diabetes Mother   . Diabetes Brother   . Obesity Brother   . Asthma Maternal Grandfather       ROS:  Full 14 point review of systems complete and found to be negative unless listed above.  Physical Exam: Blood  pressure 114/81, pulse 73, temperature 98.1 F (36.7 C), temperature source Oral, resp. rate 18, height 5\' 8"  (1.727 m), weight 300 lb (136.079 kg), SpO2 98 %.  General: Well developed, well nourished, male in no acute distress Head: Eyes PERRLA, No xanthomas. Normocephalic and atraumatic, oropharynx without edema or exudate.  Lungs: Resp regular and unlabored, CTA. Heart: RRR no s3, s4, or murmurs..   Neck: No carotid bruits. No lymphadenopathy.  No JVD. Abdomen: Bowel sounds present, Distended abdomen. Non tender.  Msk:  No spine or cva tenderness. No weakness, no joint deformities or effusions. Extremities: No clubbing, cyanosis or edema. DP/PT/Radials 2+ and equal bilaterally. Neuro: Alert and oriented X 3. No focal deficits noted. Psych:  Good affect, responds appropriately Skin: No rashes or lesions noted.  Labs:   Lab Results  Component Value Date   WBC 7.8 08/04/2015   HGB 13.7 08/04/2015   HCT 42.6 08/04/2015   MCV 88.0 08/04/2015   PLT 241 08/04/2015   No results for input(s): INR in the last 72 hours.  Recent Labs Lab 08/04/15 0615  NA 137  K 3.4*  CL 102  CO2 27  BUN 10  CREATININE 1.06  CALCIUM 8.9  PROT 6.6  BILITOT 0.9  ALKPHOS 70  ALT 43  AST 26  GLUCOSE 99  ALBUMIN 3.9   No results found for: MG No results for input(s): CKTOTAL, CKMB, TROPONINI in the last 72 hours.  Recent Labs  08/04/15 0617  TROPIPOC 0.00   PRO B NATRIURETIC PEPTIDE (BNP)  Date/Time Value Ref Range Status  08/17/2013 08:57 AM 7.0 0 - 125 pg/mL Final   Lab Results  Component  Value Date   CHOL 147 03/16/2014   HDL 33.70* 03/16/2014   LDLCALC 81 03/16/2014   TRIG 163.0* 03/16/2014     ECG:  Vent. rate 82 BPM PR interval 152 ms QRS duration 91 ms QT/QTc 388/453 ms P-R-T axes 52 -14 52  Cath 08/18/2013 Coronary angiography: Coronary dominance: right  Left mainstem: Normal  Left anterior descending (LAD): Focal, eccentric 50% proximal LAD at take off of SP1.  Left circumflex (LCx): Small, normal.  Right coronary artery (RCA): large dominant, normal.  Left ventriculography: Left ventricular systolic function is normal, LVEF is estimated at 55-65%, there is no significant mitral regurgitation   Given the uncertainty of the significance of the LAD lesion we proceeded with flow wire analysis of this vessel. Once a therapeutic ACT was obtained with IV heparin I used a FL3.5 guide to access the vessel. A flow wire was used to cross the lesion. IV adenosine was used to achieve maximal hyperemia. FFR was 0.88.  Final Conclusions:  1. Borderline proximal LAD stenosis with normal FFR. No other significant disease. 2. Normal LV function.  Recommendations: risk factor modification.  Radiology:  Dg Chest 2 View  08/04/2015  CLINICAL DATA:  Chest pain. EXAM: CHEST  2 VIEW COMPARISON:  07/24/2014. FINDINGS: Mediastinum and hilar structures normal. Lungs are clear. Heart size normal. No pleural effusion or pneumothorax. IMPRESSION: No acute cardiopulmonary disease. Electronically Signed   By: Marcello Moores  Register   On: 08/04/2015 08:45    ASSESSMENT AND PLAN:     1. Chest/ abdominal discomfort with bloating and burping - His chest discomfort is very atypical. History of suggestive of GI etiology. He continues to have abdominal discomfort, currently pain at right lower quadrant with bloating sensation. Feels like he is constipated. He also has history of dysphagia to solid foods. Upper endoscopy 11/2013 -  without obvious stricture s/p Maloney dilation. Suggest GI  evaluation.  2. CAD - LHC (08/18/13) showed  pLAD 50%, CFX and RCA normal.  EF 55-65%.  LAD lesion FFR:  0.88 (normal).  Med rx recommended.   - No anginal pain. He is able to do household chors and grocery shopping without any exertional chest pain or shortness of breath. - No inpatient ischemic evaluation. Follow-up with Dr. Percival Spanish as an outpatient. POC trop negative and EKG non ischemic.   3. Sleep apnea - He was diagnosed with sleep apnea more than year ago however he never got CPAP machine. Likely due to cost. He is to follow-up with the primary care.  4. Morbid obesity - Body mass index is 45.63 kg/(m^2).  - Previously he was recommended for beriatric surgery by Dr. Deatra Ina. Advise significant lifestyle modification including aggressive weight loss and dietary changes. He says states that he is trying.  5. DM - Follow-up with primary care physician.  6. Hypertension - Relatively stable and  well control. Continue home regimen.  7.hypothyroidism - His TSH was elevated 02/01/2015 after being off medication for while. Compliant since. He will need repeat TSH check. F/u with PCP.   8. HL - No results found for requested labs within last 365 days.  - Will need lipid panel. F/u with PCP or Dr. Percival Spanish. Continue home dose of Zocor 40mg  for now.   SignedLeanor Kail, PA 08/04/2015, 9:27 AM Pager [phone]  Co-Sign MD

## 2015-08-04 NOTE — ED Notes (Signed)
Pt arrives with c/o feeling bloated/uncomfortable since yesterday after dinner. Pt goes on longwinded tangents. States he has concerns about appendicitis and hernia and STEMI. Pain in mid abdomen radiating up to chest and into R flank. States antiacids helpful.

## 2015-08-04 NOTE — ED Notes (Signed)
Physician at bedside.

## 2015-08-04 NOTE — Discharge Instructions (Signed)
It is not appear to be an emergent cause for your symptoms at this time. Your exam, labs, ECG and chest x-ray were all reassuring today. Please call with your doctor as needed for further evaluation and management of your symptoms. You may also follow up with Cardiology for further testing for your chest discomfort. Return to ED for any new or worsening symptoms.  Abdominal Pain, Adult Many things can cause abdominal pain. Usually, abdominal pain is not caused by a disease and will improve without treatment. It can often be observed and treated at home. Your health care provider will do a physical exam and possibly order blood tests and X-rays to help determine the seriousness of your pain. However, in many cases, more time must pass before a clear cause of the pain can be found. Before that point, your health care provider may not know if you need more testing or further treatment. HOME CARE INSTRUCTIONS Monitor your abdominal pain for any changes. The following actions may help to alleviate any discomfort you are experiencing:  Only take over-the-counter or prescription medicines as directed by your health care provider.  Do not take laxatives unless directed to do so by your health care provider.  Try a clear liquid diet (broth, tea, or water) as directed by your health care provider. Slowly move to a bland diet as tolerated. SEEK MEDICAL CARE IF:  You have unexplained abdominal pain.  You have abdominal pain associated with nausea or diarrhea.  You have pain when you urinate or have a bowel movement.  You experience abdominal pain that wakes you in the night.  You have abdominal pain that is worsened or improved by eating food.  You have abdominal pain that is worsened with eating fatty foods.  You have a fever. SEEK IMMEDIATE MEDICAL CARE IF:  Your pain does not go away within 2 hours.  You keep throwing up (vomiting).  Your pain is felt only in portions of the abdomen, such as  the right side or the left lower portion of the abdomen.  You pass bloody or black tarry stools. MAKE SURE YOU:  Understand these instructions.  Will watch your condition.  Will get help right away if you are not doing well or get worse.   This information is not intended to replace advice given to you by your health care provider. Make sure you discuss any questions you have with your health care provider.   Document Released: 06/05/2005 Document Revised: 05/17/2015 Document Reviewed: 05/05/2013 Elsevier Interactive Patient Education 2016 [employer].  Constipation, Adult Constipation is when a person has fewer than three bowel movements a week, has difficulty having a bowel movement, or has stools that are dry, hard, or larger than normal. As people grow older, constipation is more common. A low-fiber diet, not taking in enough fluids, and taking certain medicines may make constipation worse.  CAUSES   Certain medicines, such as antidepressants, pain medicine, iron supplements, antacids, and water pills.   Certain diseases, such as diabetes, irritable bowel syndrome (IBS), thyroid disease, or depression.   Not drinking enough water.   Not eating enough fiber-rich foods.   Stress or travel.   Lack of physical activity or exercise.   Ignoring the urge to have a bowel movement.   Using laxatives too much.  SIGNS AND SYMPTOMS   Having fewer than three bowel movements a week.   Straining to have a bowel movement.   Having stools that are hard, dry, or larger than  normal.   Feeling full or bloated.   Pain in the lower abdomen.   Not feeling relief after having a bowel movement.  DIAGNOSIS  Your health care provider will take a medical history and perform a physical exam. Further testing may be done for severe constipation. Some tests may include:  A barium enema X-ray to examine your rectum, colon, and, sometimes, your small intestine.   A  sigmoidoscopy to examine your lower colon.   A colonoscopy to examine your entire colon. TREATMENT  Treatment will depend on the severity of your constipation and what is causing it. Some dietary treatments include drinking more fluids and eating more fiber-rich foods. Lifestyle treatments may include regular exercise. If these diet and lifestyle recommendations do not help, your health care provider may recommend taking over-the-counter laxative medicines to help you have bowel movements. Prescription medicines may be prescribed if over-the-counter medicines do not work.  HOME CARE INSTRUCTIONS   Eat foods that have a lot of fiber, such as fruits, vegetables, whole grains, and beans.  Limit foods high in fat and processed sugars, such as french fries, hamburgers, cookies, candies, and soda.   A fiber supplement may be added to your diet if you cannot get enough fiber from foods.   Drink enough fluids to keep your urine clear or pale yellow.   Exercise regularly or as directed by your health care provider.   Go to the restroom when you have the urge to go. Do not hold it.   Only take over-the-counter or prescription medicines as directed by your health care provider. Do not take other medicines for constipation without talking to your health care provider first.  [place] IF:   You have bright red blood in your stool.   Your constipation lasts for more than 4 days or gets worse.   You have abdominal or rectal pain.   You have thin, pencil-like stools.   You have unexplained weight loss. MAKE SURE YOU:   Understand these instructions.  Will watch your condition.  Will get help right away if you are not doing well or get worse.   This information is not intended to replace advice given to you by your health care provider. Make sure you discuss any questions you have with your health care provider.   Document Released: 05/24/2004 Document Revised:  09/16/2014 Document Reviewed: 06/07/2013 Elsevier Interactive Patient Education [employer].

## 2015-08-04 NOTE — ED Provider Notes (Signed)
CSN: [id number]     Arrival date & time 08/04/15  0543 History   First MD Initiated Contact with Patient 08/04/15 0554     Chief Complaint  Patient presents with  . Bloated  . Chest Pain     (Consider location/radiation/quality/duration/timing/severity/associated sxs/prior Treatment) HPI Gabriel [name] is a 49 y.o. male with a history of cholecystectomy, hypertension, hyperlipidemia, obesity, diabetes, coronary artery disease and left heart cath in 2014 that showed borderline proximal LAD stenosis, comes in for evaluation of chest discomfort, abdominal discomfort and feeling bloated. Patient reports after he ate thanksgiving lunch yesterday, he began to have some chest discomfort that resolved with antacids and cinnamon candy. He reports associated right-sided abdominal discomfort. He reports he has not had a bowel movement in 2 days and is usually very regular. He denies any fevers, chills, shortness of breath, diaphoresis, nausea or vomiting, dark or bloody stools. Mild abdominal discomfort now in ED.  Past Medical History  Diagnosis Date  . Hypertension     a. Dx 3y ago.  [name] Hyperlipidemia     a. Dx 3y ago - not on statin.  [name] GERD (gastroesophageal reflux disease)   . Hypothyroidism     a. on replacement.  . Obesity   . Snoring   . Chest pain     a. 08/2013 Cardia CTA: Ca score 238 (95%), LM nl, LAD mod stenosis in D2 area, D1 mild ost stenosis, D2 no signif dzs, LCX small, RCA nl.  . Type II diabetes mellitus ([place])   . Arthritis     "joints; from where I've had surgeries" (08/17/2013)  . Coronary artery disease     LHC (08/18/13):  pLAD 50, CFX and RCA normal.  EF 55-65%.  LAD lesion FFR:  0.88 (normal).  Med rx recommended.    . Sleep apnea   . Dysphagia     Upper endoscopy 11/2013 - without obvious stricture s/p Maloney dilation.    Past Surgical History  Procedure Laterality Date  . Shoulder arthroscopy w/ rotator cuff repair Left   . Knee arthroscopy Right X 2  . Ankle  surgery Right     "had a growth in it; cut it out" (08/17/2013)  . Cholecystectomy    . Left heart catheterization with coronary angiogram N/A 08/18/2013    Procedure: LEFT HEART CATHETERIZATION WITH CORONARY ANGIOGRAM;  Surgeon: Peter M Martinique, MD;  Location: [facility] CATH LAB;  Service: Cardiovascular;  Laterality: N/A;   Family History  Problem Relation Age of Onset  . Adopted: Yes  . Coronary artery disease Father     s/p MI in his early 30's->CABG x 5 @ age 69, currently 65.  . Diabetes Father   . Stroke Father   . Hypertension Father   . Hypertension Mother   . Obesity Mother   . Diabetes Mother   . Diabetes Brother   . Obesity Brother   . Asthma Maternal Grandfather    Social History  Substance Use Topics  . Smoking status: Former Smoker -- 1.50 packs/day for 24 years    Types: Cigarettes    Quit date: 09/10/2007  . Smokeless tobacco: Never Used  . Alcohol Use: No    Review of Systems A 10 point review of systems was completed and was negative except for pertinent positives and negatives as mentioned in the history of present illness     Allergies  Review of patient's allergies indicates no known allergies.  Home Medications   Prior to Admission medications  Medication Sig Start Date End Date Taking? Authorizing Provider  acetaminophen (TYLENOL) 500 MG tablet Take 1,000 mg by mouth every 6 (six) hours as needed for mild pain or moderate pain.   Yes Historical Provider, MD  aspirin EC 81 MG EC tablet Take 1 tablet (81 mg total) by mouth daily. 08/10/13  Yes Rogelia Mire, NP  levothyroxine (SYNTHROID, LEVOTHROID) 100 MCG tablet Take 1 tablet (100 mcg total) by mouth daily. 02/01/15  Yes Marin Olp, MD  losartan (COZAAR) 100 MG tablet Take 1 tablet (100 mg total) by mouth daily. 02/01/15  Yes Marin Olp, MD  metFORMIN (GLUCOPHAGE) 500 MG tablet TAKE 1 TABLET BY MOUTH 2 TIMES DAILY WITH A MEAL. Patient taking differently: Take 500 mg by mouth 2 (two) times  daily with a meal.  02/01/15  Yes Marin Olp, MD  Multiple Vitamins-Minerals (MULTIVITAMIN,TX-MINERALS) tablet Take 1 tablet by mouth daily.     Yes Historical Provider, MD  pantoprazole (PROTONIX) 40 MG tablet Take 1 tablet (40 mg total) by mouth daily. 02/01/15  Yes Marin Olp, MD  simvastatin (ZOCOR) 40 MG tablet Take 1 tablet (40 mg total) by mouth at bedtime. 02/01/15  Yes Marin Olp, MD  ibuprofen (ADVIL,MOTRIN) 200 MG tablet Take 400 mg by mouth every 6 (six) hours as needed for mild pain or moderate pain.    Historical Provider, MD  nitroGLYCERIN (NITROSTAT) 0.4 MG SL tablet Place 1 tablet (0.4 mg total) under the tongue every 5 (five) minutes as needed for chest pain (CP or SOB). 02/01/15   Marin Olp, MD   BP 142/74 mmHg  Pulse 79  Temp(Src) 97.5 F (36.4 C) (Oral)  Resp 16  Ht 5\' 8"  (1.727 m)  Wt 136.079 kg  BMI 45.63 kg/m2  SpO2 97% Physical Exam  Constitutional: He is oriented to person, place, and time. He appears well-developed and well-nourished.  Obese white male  HENT:  Head: Normocephalic and atraumatic.  Mouth/Throat: Oropharynx is clear and moist.  Eyes: Conjunctivae are normal. Pupils are equal, round, and reactive to light. Right eye exhibits no discharge. Left eye exhibits no discharge. No scleral icterus.  Neck: Neck supple.  Cardiovascular: Normal rate, regular rhythm and normal heart sounds.   Pulmonary/Chest: Effort normal and breath sounds normal. No respiratory distress. He has no wheezes. He has no rales.  Abdominal: Soft.  Large panniculus. Diffuse right-sided abdominal tenderness with deep palpation. No peritoneal signs. No rebound or guarding. No other lesions or deformities  Musculoskeletal: He exhibits no tenderness.  Neurological: He is alert and oriented to person, place, and time.  Cranial Nerves II-XII grossly intact  Skin: Skin is warm and dry. No rash noted.  Psychiatric: He has a normal mood and affect.  Nursing note and  vitals reviewed.   ED Course  Procedures (including critical care time) Labs Review Labs Reviewed  BASIC METABOLIC PANEL - Abnormal; Notable for the following:    Potassium 3.4 (*)    All other components within normal limits  HEPATIC FUNCTION PANEL - Abnormal; Notable for the following:    Bilirubin, Direct <0.1 (*)    All other components within normal limits  CBC  URINALYSIS, ROUTINE W REFLEX MICROSCOPIC (NOT AT [facility])  LIPASE, BLOOD  I-STAT TROPOININ, ED  Randolm Idol, ED    Imaging Review Dg Chest 2 View  08/04/2015  CLINICAL DATA:  Chest pain. EXAM: CHEST  2 VIEW COMPARISON:  07/24/2014. FINDINGS: Mediastinum and hilar structures normal. Lungs are  clear. Heart size normal. No pleural effusion or pneumothorax. IMPRESSION: No acute cardiopulmonary disease. Electronically Signed   By: Marcello Moores  Register   On: 08/04/2015 08:45   I have personally reviewed and evaluated these images and lab results as part of my medical decision-making.   EKG Interpretation   Date/Time:  Friday August 04 2015 05:56:35 EST Ventricular Rate:  82 PR Interval:  152 QRS Duration: 91 QT Interval:  388 QTC Calculation: 453 R Axis:   -14 Text Interpretation:  Sinus rhythm Abnormal R-wave progression, early  transition When compared with ECG of 07/24/2014, Nonspecific ST  abnormality is no longer Present Confirmed by [facility]  MD, DAVID ([id number]) on  08/04/2015 6:04:05 AM     Meds given in ED:  Medications - No data to display  Discharge Medication List as of 08/04/2015 11:14 AM     Filed Vitals:   08/04/15 0733 08/04/15 0745 08/04/15 0958 08/04/15 1115  BP: 114/81 118/77  142/74  Pulse: 73 73 75 79  Temp:   97.5 F (36.4 C)   TempSrc:   Oral   Resp: 18  18 16   Height:      Weight:      SpO2: 98% 97% 96% 97%    MDM  Gabriel [name] is a 49 y.o. male who presents for evaluation of chest discomfort as well as abdominal discomfort and bloating. Patient became symptomatic after  eating Thanksgiving lunch. Chest discomfort was improved with antacids and cinnamon candy. On arrival, he is hemodynamically stable, ECG is reassuring, chest x-ray shows no acute cardiopulmonary pathology. Delta troponin is negative. However, patient has multiple vital risk factors and has had a left heart cath on 12/14 that showed proximal LAD stenosis, will consult cardiology. Cardiology to see in the ED, agrees etiology is most likely GI with some constipation component. Patient may follow-up on outpatient basis for stress test. Prior to patient discharge, I discussed and reviewed this case with my attending, Dr. Billy Fischer who agrees with above plan. Final diagnoses:  Abdominal discomfort  Chest discomfort  Constipation, unspecified constipation type       Comer Locket, PA-C [id number] [id number]  Delora Fuel, MD [id number]

## 2015-08-04 NOTE — ED Notes (Signed)
Patient transported to X-ray 

## 2015-08-04 NOTE — Consult Note (Signed)
The patient has been seen in conjunction with B. Bhagat, PAC. All aspects of care have been considered and discussed. The patient has been personally interviewed, examined, and all clinical data has been reviewed.   Atypical history of abdominal bloating and heartburn after eating a large Thanksgiving dinner. Similar heartburn occurred and lasted for 3 days a proximally one month ago. He tried nitroglycerin at that time without relief. Discomfort eventually resolved. Bloating feeling and heartburn are now resolved.  There is no objective data to suggest ischemia. Specifically, the EKG is unremarkable, 2 sets of point-of-care markers are unremarkable and chest x-ray is clear.  Known have nonobstructive coronary disease and multiple risk factors. Cardiac catheterization was performed 2 years ago.  He is ruled out for myocardial infarction. To be on the safe side he needs to have a myocardial perfusion study as an outpatient. We will arrange.

## 2015-08-08 ENCOUNTER — Telehealth (HOSPITAL_COMMUNITY): Payer: Self-pay | Admitting: *Deleted

## 2015-09-13 ENCOUNTER — Telehealth (HOSPITAL_COMMUNITY): Payer: Self-pay

## 2015-09-13 ENCOUNTER — Telehealth: Payer: Self-pay | Admitting: Cardiology

## 2015-09-13 NOTE — Telephone Encounter (Signed)
Spoke w/pt.  He will be self pay for 09-15-15 myoview.  Advised can pick up [place] forms when checks in to see if qualifies for financial aid.

## 2015-09-14 ENCOUNTER — Telehealth (HOSPITAL_COMMUNITY): Payer: Self-pay

## 2015-09-14 NOTE — Telephone Encounter (Signed)
Encounter complete. 

## 2015-09-15 ENCOUNTER — Ambulatory Visit (HOSPITAL_COMMUNITY)
Admission: RE | Admit: 2015-09-15 | Discharge: 2015-09-15 | Disposition: A | Discharge status: Home / Self Care | Payer: Self-pay | Source: Ambulatory Visit | Attending: Cardiology | Admitting: Cardiology

## 2015-09-15 DIAGNOSIS — R0789 Other chest pain: Secondary | ICD-10-CM | POA: Diagnosis not present

## 2015-09-15 DIAGNOSIS — R42 Dizziness and giddiness: Secondary | ICD-10-CM | POA: Insufficient documentation

## 2015-09-15 DIAGNOSIS — R5383 Other fatigue: Secondary | ICD-10-CM | POA: Insufficient documentation

## 2015-09-15 DIAGNOSIS — Z87891 Personal history of nicotine dependence: Secondary | ICD-10-CM | POA: Insufficient documentation

## 2015-09-15 DIAGNOSIS — Z6841 Body Mass Index (BMI) 40.0 and over, adult: Secondary | ICD-10-CM | POA: Insufficient documentation

## 2015-09-15 DIAGNOSIS — I10 Essential (primary) hypertension: Secondary | ICD-10-CM | POA: Insufficient documentation

## 2015-09-15 DIAGNOSIS — R0602 Shortness of breath: Secondary | ICD-10-CM | POA: Insufficient documentation

## 2015-09-15 DIAGNOSIS — G4733 Obstructive sleep apnea (adult) (pediatric): Secondary | ICD-10-CM | POA: Insufficient documentation

## 2015-09-15 DIAGNOSIS — E119 Type 2 diabetes mellitus without complications: Secondary | ICD-10-CM | POA: Insufficient documentation

## 2015-09-15 DIAGNOSIS — R9439 Abnormal result of other cardiovascular function study: Secondary | ICD-10-CM | POA: Insufficient documentation

## 2015-09-15 DIAGNOSIS — Z8249 Family history of ischemic heart disease and other diseases of the circulatory system: Secondary | ICD-10-CM | POA: Insufficient documentation

## 2015-09-15 DIAGNOSIS — E669 Obesity, unspecified: Secondary | ICD-10-CM | POA: Insufficient documentation

## 2015-09-15 MED ORDER — REGADENOSON 0.4 MG/5ML IV SOLN
0.4000 mg | Freq: Once | INTRAVENOUS | Status: AC
  Administered 2015-09-15: 0.4 mg via INTRAVENOUS

## 2015-09-15 MED ORDER — TECHNETIUM TC 99M SESTAMIBI GENERIC - CARDIOLITE
31.2000 | Freq: Once | INTRAVENOUS | Status: AC | PRN
  Administered 2015-09-15: 31.2 via INTRAVENOUS

## 2015-09-19 ENCOUNTER — Ambulatory Visit (HOSPITAL_COMMUNITY)
Admission: RE | Admit: 2015-09-19 | Discharge: 2015-09-19 | Disposition: A | Discharge status: Home / Self Care | Payer: Self-pay | Source: Ambulatory Visit | Attending: Cardiology | Admitting: Cardiology

## 2015-09-19 LAB — MYOCARDIAL PERFUSION IMAGING
CSEPPHR: 102 {beats}/min
LV sys vol: 45 mL
LVDIAVOL: 101 mL
Rest HR: 78 {beats}/min
SSS: 7
TID: 1.12

## 2015-09-19 MED ORDER — TECHNETIUM TC 99M SESTAMIBI GENERIC - CARDIOLITE
32.0000 | Freq: Once | INTRAVENOUS | Status: AC | PRN
  Administered 2015-09-19: 32 via INTRAVENOUS

## 2015-09-22 ENCOUNTER — Encounter: Payer: Self-pay | Admitting: Cardiology

## 2015-09-22 ENCOUNTER — Ambulatory Visit (INDEPENDENT_AMBULATORY_CARE_PROVIDER_SITE_OTHER): Payer: Self-pay | Admitting: Cardiology

## 2015-09-22 VITALS — BP 130/76 | HR 90 | Ht 69.0 in | Wt 315.3 lb

## 2015-09-22 DIAGNOSIS — R9439 Abnormal result of other cardiovascular function study: Secondary | ICD-10-CM | POA: Insufficient documentation

## 2015-09-22 DIAGNOSIS — I2510 Atherosclerotic heart disease of native coronary artery without angina pectoris: Secondary | ICD-10-CM

## 2015-09-22 LAB — CBC WITH DIFFERENTIAL/PLATELET
Basophils Absolute: 0 10*3/uL (ref 0.0–0.1)
Basophils Relative: 0 % (ref 0–1)
Eosinophils Absolute: 0.3 10*3/uL (ref 0.0–0.7)
Eosinophils Relative: 4 % (ref 0–5)
HEMATOCRIT: 45.3 % (ref 39.0–52.0)
HEMOGLOBIN: 15.1 g/dL (ref 13.0–17.0)
LYMPHS PCT: 34 % (ref 12–46)
Lymphs Abs: 2.5 10*3/uL (ref 0.7–4.0)
MCH: 28.8 pg (ref 26.0–34.0)
MCHC: 33.3 g/dL (ref 30.0–36.0)
MCV: 86.5 fL (ref 78.0–100.0)
MONO ABS: 0.7 10*3/uL (ref 0.1–1.0)
MONOS PCT: 10 % (ref 3–12)
MPV: 10.6 fL (ref 8.6–12.4)
NEUTROS ABS: 3.8 10*3/uL (ref 1.7–7.7)
NEUTROS PCT: 52 % (ref 43–77)
Platelets: 284 10*3/uL (ref 150–400)
RBC: 5.24 MIL/uL (ref 4.22–5.81)
RDW: 13.6 % (ref 11.5–15.5)
WBC: 7.3 10*3/uL (ref 4.0–10.5)

## 2015-09-22 NOTE — Progress Notes (Signed)
HPI The patient returns for follow up of CAD which is being managed medically.   He has a history of an LAD stenosis of 50% demonstrated in December 2014. He had a normal  FFR at that time.  He was in the emergency room however on the sixth of this month with chest discomfort. He was seen in consultation. It was felt to be somewhat atypical pain. He had some chest discomfort with burping. It did not respond to treatment with an antacid. His cardiac markers were negative in the ER. He did report having used nitroglycerin about one month prior to this. He had a stress perfusion study done 2 days ago which demonstrated an EF of 56%. There was a medium-sized moderate defect in the basal inferoseptal mid anteroseptal and mid inferoseptal apical region.  He came back today to discuss this and did have a description of some further discomfort last few days.  This has been more mild and discomfort brought him to the emergency room however.  No Known Allergies  Current Outpatient Prescriptions  Medication Sig Dispense Refill  . acetaminophen (TYLENOL) 500 MG tablet Take 1,000 mg by mouth every 6 (six) hours as needed for mild pain or moderate pain.    [name] aspirin EC 81 MG EC tablet Take 1 tablet (81 mg total) by mouth daily.    [name] ibuprofen (ADVIL,MOTRIN) 200 MG tablet Take 400 mg by mouth every 6 (six) hours as needed for mild pain or moderate pain.    [name] levothyroxine (SYNTHROID, LEVOTHROID) 100 MCG tablet Take 1 tablet (100 mcg total) by mouth daily. 30 tablet 11  . losartan (COZAAR) 100 MG tablet Take 1 tablet (100 mg total) by mouth daily. 30 tablet 11  . metFORMIN (GLUCOPHAGE) 500 MG tablet TAKE 1 TABLET BY MOUTH 2 TIMES DAILY WITH A MEAL. (Patient taking differently: Take 500 mg by mouth 2 (two) times daily with a meal. ) 60 tablet 11  . Multiple Vitamins-Minerals (MULTIVITAMIN,TX-MINERALS) tablet Take 1 tablet by mouth daily.      . nitroGLYCERIN (NITROSTAT) 0.4 MG SL tablet Place 1 tablet (0.4 mg  total) under the tongue every 5 (five) minutes as needed for chest pain (CP or SOB). 25 tablet 3  . pantoprazole (PROTONIX) 40 MG tablet Take 1 tablet (40 mg total) by mouth daily. 30 tablet 11  . simvastatin (ZOCOR) 40 MG tablet Take 1 tablet (40 mg total) by mouth at bedtime. 30 tablet 11   No current facility-administered medications for this visit.    Past Medical History  Diagnosis Date  . Hypertension     a. Dx 3y ago.  [name] Hyperlipidemia     a. Dx 3y ago - not on statin.  [name] GERD (gastroesophageal reflux disease)   . Hypothyroidism     a. on replacement.  . Obesity   . Snoring   . Chest pain     a. 08/2013 Cardia CTA: Ca score 238 (95%), LM nl, LAD mod stenosis in D2 area, D1 mild ost stenosis, D2 no signif dzs, LCX small, RCA nl.  . Type II diabetes mellitus ([place])   . Arthritis     "joints; from where I've had surgeries" (08/17/2013)  . Coronary artery disease     LHC (08/18/13):  pLAD 50, CFX and RCA normal.  EF 55-65%.  LAD lesion FFR:  0.88 (normal).  Med rx recommended.    . Sleep apnea   . Dysphagia     Upper endoscopy 11/2013 -  without obvious stricture s/p Maloney dilation.     Past Surgical History  Procedure Laterality Date  . Shoulder arthroscopy w/ rotator cuff repair Left   . Knee arthroscopy Right X 2  . Ankle surgery Right     "had a growth in it; cut it out" (08/17/2013)  . Cholecystectomy    . Left heart catheterization with coronary angiogram N/A 08/18/2013    Procedure: LEFT HEART CATHETERIZATION WITH CORONARY ANGIOGRAM;  Surgeon: Peter M Martinique, MD;  Location: [facility] CATH LAB;  Service: Cardiovascular;  Laterality: N/A;    ROS:  As stated in the HPI and negative for all other systems.  PHYSICAL EXAM BP 130/76 mmHg  Pulse 90  Ht 5\' 9"  (1.753 m)  Wt 315 lb 5 oz (143.025 kg)  BMI 46.54 kg/m2 GENERAL:  Well appearing NECK:  No jugular venous distention, waveform within normal limits, carotid upstroke brisk and symmetric, no bruits, no  thyromegaly LYMPHATICS:  No cervical, inguinal adenopathy LUNGS:  Clear to auscultation bilaterally BACK:  No CVA tenderness CHEST:  Unremarkable HEART:  PMI not displaced or sustained,S1 and S2 within normal limits, no S3, no S4, no clicks, no rubs, no murmurs ABD:  Flat, positive bowel sounds normal in frequency in pitch, no bruits, no rebound, no guarding, no midline pulsatile mass, no hepatomegaly, no splenomegaly, obese EXT:  2 plus pulses throughout, no edema, no cyanosis no clubbing    ASSESSMENT AND PLAN  CAD:  Given the chest discomfort and the abnormal stress test further testing is indicated. He will need a cardiac catheterization. The patient understands that risks included but are not limited to stroke (1 in 1000), death (1 in 45), kidney failure [usually temporary] (1 in 500), bleeding (1 in 200), allergic reaction [possibly serious] (1 in 200).  The patient understands and agrees to proceed.     GERD:  He will continue with Protonix.   Snoring:  He does have sleep apnea but might have trouble affording CPAP.  Hypertension:  The blood pressure is at target. No change in medications is indicated. We will continue with therapeutic lifestyle changes (TLC).  Hyperlipidemia:  He will continue with meds as listed.    I would like to repeat a lipid profile when he comes back for follow-up area he had an excellent profile in 2015.  Diabetes Mellitus:  F/u with primary care.  Obesity:  We discussed the importance of weight loss and different strategies.

## 2015-09-22 NOTE — Patient Instructions (Signed)
Schedule left cardiac cath with Dr.Jordan

## 2015-09-23 LAB — BASIC METABOLIC PANEL
BUN: 11 mg/dL (ref 7–25)
CHLORIDE: 101 mmol/L (ref 98–110)
CO2: 27 mmol/L (ref 20–31)
Calcium: 9.5 mg/dL (ref 8.6–10.3)
Creat: 0.88 mg/dL (ref 0.60–1.35)
Glucose, Bld: 100 mg/dL — ABNORMAL HIGH (ref 65–99)
Potassium: 4.1 mmol/L (ref 3.5–5.3)
SODIUM: 138 mmol/L (ref 135–146)

## 2015-09-23 LAB — PROTIME-INR
INR: 0.98 (ref <1.50)
Prothrombin Time: 13.1 seconds (ref 11.6–15.2)

## 2015-09-28 ENCOUNTER — Encounter (HOSPITAL_COMMUNITY): Payer: Self-pay | Admitting: Cardiology

## 2015-09-28 ENCOUNTER — Ambulatory Visit (HOSPITAL_COMMUNITY)
Admission: RE | Admit: 2015-09-28 | Discharge: 2015-09-28 | Disposition: A | Discharge status: Home / Self Care | Payer: PRIVATE HEALTH INSURANCE | Source: Ambulatory Visit | Attending: Cardiology | Admitting: Cardiology

## 2015-09-28 ENCOUNTER — Encounter (HOSPITAL_COMMUNITY)
Admission: RE | Disposition: A | Discharge status: Home / Self Care | Payer: Self-pay | Source: Ambulatory Visit | Attending: Cardiology

## 2015-09-28 DIAGNOSIS — E119 Type 2 diabetes mellitus without complications: Secondary | ICD-10-CM

## 2015-09-28 DIAGNOSIS — M1990 Unspecified osteoarthritis, unspecified site: Secondary | ICD-10-CM | POA: Insufficient documentation

## 2015-09-28 DIAGNOSIS — R9439 Abnormal result of other cardiovascular function study: Secondary | ICD-10-CM | POA: Diagnosis present

## 2015-09-28 DIAGNOSIS — E039 Hypothyroidism, unspecified: Secondary | ICD-10-CM | POA: Diagnosis not present

## 2015-09-28 DIAGNOSIS — E785 Hyperlipidemia, unspecified: Secondary | ICD-10-CM | POA: Diagnosis not present

## 2015-09-28 DIAGNOSIS — R079 Chest pain, unspecified: Secondary | ICD-10-CM | POA: Diagnosis present

## 2015-09-28 DIAGNOSIS — E669 Obesity, unspecified: Secondary | ICD-10-CM | POA: Insufficient documentation

## 2015-09-28 DIAGNOSIS — G4730 Sleep apnea, unspecified: Secondary | ICD-10-CM | POA: Insufficient documentation

## 2015-09-28 DIAGNOSIS — Z7982 Long term (current) use of aspirin: Secondary | ICD-10-CM | POA: Diagnosis not present

## 2015-09-28 DIAGNOSIS — K219 Gastro-esophageal reflux disease without esophagitis: Secondary | ICD-10-CM | POA: Insufficient documentation

## 2015-09-28 DIAGNOSIS — E1159 Type 2 diabetes mellitus with other circulatory complications: Secondary | ICD-10-CM | POA: Diagnosis present

## 2015-09-28 DIAGNOSIS — I10 Essential (primary) hypertension: Secondary | ICD-10-CM | POA: Diagnosis not present

## 2015-09-28 DIAGNOSIS — I2510 Atherosclerotic heart disease of native coronary artery without angina pectoris: Secondary | ICD-10-CM | POA: Diagnosis not present

## 2015-09-28 DIAGNOSIS — E1169 Type 2 diabetes mellitus with other specified complication: Secondary | ICD-10-CM | POA: Diagnosis present

## 2015-09-28 DIAGNOSIS — Z6841 Body Mass Index (BMI) 40.0 and over, adult: Secondary | ICD-10-CM | POA: Insufficient documentation

## 2015-09-28 DIAGNOSIS — Z7984 Long term (current) use of oral hypoglycemic drugs: Secondary | ICD-10-CM | POA: Insufficient documentation

## 2015-09-28 DIAGNOSIS — R0789 Other chest pain: Secondary | ICD-10-CM | POA: Insufficient documentation

## 2015-09-28 DIAGNOSIS — R0600 Dyspnea, unspecified: Secondary | ICD-10-CM | POA: Diagnosis present

## 2015-09-28 HISTORY — PX: CARDIAC CATHETERIZATION: SHX172

## 2015-09-28 LAB — GLUCOSE, CAPILLARY: GLUCOSE-CAPILLARY: 140 mg/dL — AB (ref 65–99)

## 2015-09-28 SURGERY — LEFT HEART CATH AND CORONARY ANGIOGRAPHY
Anesthesia: LOCAL

## 2015-09-28 MED ORDER — LIDOCAINE HCL (PF) 1 % IJ SOLN
INTRAMUSCULAR | Status: DC | PRN
  Administered 2015-09-28: 10:00:00

## 2015-09-28 MED ORDER — IOHEXOL 350 MG/ML SOLN
INTRAVENOUS | Status: DC | PRN
  Administered 2015-09-28: 100 mL via INTRAVENOUS

## 2015-09-28 MED ORDER — MIDAZOLAM HCL 2 MG/2ML IJ SOLN
INTRAMUSCULAR | Status: AC
  Filled 2015-09-28: qty 2

## 2015-09-28 MED ORDER — SODIUM CHLORIDE 0.9 % WEIGHT BASED INFUSION
3.0000 mL/kg/h | INTRAVENOUS | Status: AC
  Administered 2015-09-28: 3 mL/kg/h via INTRAVENOUS

## 2015-09-28 MED ORDER — HEPARIN SODIUM (PORCINE) 1000 UNIT/ML IJ SOLN
INTRAMUSCULAR | Status: DC | PRN
  Administered 2015-09-28: 6000 [IU] via INTRAVENOUS

## 2015-09-28 MED ORDER — ASPIRIN 81 MG PO CHEW
81.0000 mg | CHEWABLE_TABLET | ORAL | Status: AC
  Administered 2015-09-28: 81 mg via ORAL

## 2015-09-28 MED ORDER — SODIUM CHLORIDE 0.9 % IJ SOLN: 3.0000 mL | INTRAMUSCULAR | Status: DC | PRN

## 2015-09-28 MED ORDER — METFORMIN HCL 500 MG PO TABS: 500.0000 mg | ORAL_TABLET | Freq: Two times a day (BID) | ORAL | Status: DC

## 2015-09-28 MED ORDER — FENTANYL CITRATE (PF) 100 MCG/2ML IJ SOLN
INTRAMUSCULAR | Status: AC
  Filled 2015-09-28: qty 2

## 2015-09-28 MED ORDER — SODIUM CHLORIDE 0.9 % IJ SOLN: 3.0000 mL | Freq: Two times a day (BID) | INTRAMUSCULAR | Status: DC

## 2015-09-28 MED ORDER — VERAPAMIL HCL 2.5 MG/ML IV SOLN
INTRAVENOUS | Status: AC
Start: 2015-09-28 — End: 2015-09-28
  Filled 2015-09-28: qty 2

## 2015-09-28 MED ORDER — SODIUM CHLORIDE 0.9 % WEIGHT BASED INFUSION: 3.0000 mL/kg/h | INTRAVENOUS | Status: DC

## 2015-09-28 MED ORDER — LIDOCAINE HCL (PF) 1 % IJ SOLN
INTRAMUSCULAR | Status: AC
Start: 2015-09-28 — End: 2015-09-28
  Filled 2015-09-28: qty 30

## 2015-09-28 MED ORDER — HEPARIN SODIUM (PORCINE) 1000 UNIT/ML IJ SOLN
INTRAMUSCULAR | Status: AC
Start: 2015-09-28 — End: 2015-09-28
  Filled 2015-09-28: qty 1

## 2015-09-28 MED ORDER — SODIUM CHLORIDE 0.9 % WEIGHT BASED INFUSION: 1.0000 mL/kg/h | INTRAVENOUS | Status: DC

## 2015-09-28 MED ORDER — FENTANYL CITRATE (PF) 100 MCG/2ML IJ SOLN
INTRAMUSCULAR | Status: DC | PRN
  Administered 2015-09-28: 25 ug via INTRAVENOUS

## 2015-09-28 MED ORDER — SODIUM CHLORIDE 0.9 % IV SOLN: 250.0000 mL | INTRAVENOUS | Status: DC | PRN

## 2015-09-28 MED ORDER — ASPIRIN 81 MG PO CHEW
CHEWABLE_TABLET | ORAL | Status: AC
  Filled 2015-09-28: qty 1

## 2015-09-28 MED ORDER — LIDOCAINE HCL (PF) 1 % IJ SOLN
INTRAMUSCULAR | Status: DC | PRN
  Administered 2015-09-28: 2 mL

## 2015-09-28 MED ORDER — HEPARIN (PORCINE) IN NACL 2-0.9 UNIT/ML-% IJ SOLN
INTRAMUSCULAR | Status: AC
  Filled 2015-09-28: qty 1000

## 2015-09-28 MED ORDER — HEPARIN (PORCINE) IN NACL 2-0.9 UNIT/ML-% IJ SOLN
INTRAMUSCULAR | Status: DC | PRN
  Administered 2015-09-28: 10 mL via INTRA_ARTERIAL

## 2015-09-28 MED ORDER — MIDAZOLAM HCL 2 MG/2ML IJ SOLN
INTRAMUSCULAR | Status: DC | PRN
  Administered 2015-09-28: 1 mg via INTRAVENOUS

## 2015-09-28 SURGICAL SUPPLY — 11 items

## 2015-09-28 NOTE — Interval H&P Note (Signed)
History and Physical Interval Note:  09/28/2015 9:49 AM  Gabriel [name]  has presented today for surgery, with the diagnosis of cp  The various methods of treatment have been discussed with the patient and family. After consideration of risks, benefits and other options for treatment, the patient has consented to  Procedure(s): Left Heart Cath and Coronary Angiography (N/A) as a surgical intervention .  The patient's history has been reviewed, patient examined, no change in status, stable for surgery.  I have reviewed the patient's chart and labs.  Questions were answered to the patient's satisfaction.    Cath Lab Visit (complete for each Cath Lab visit)  Clinical Evaluation Leading to the Procedure:   ACS: No.  Non-ACS:    Anginal Classification: CCS II  Anti-ischemic medical therapy: No Therapy  Non-Invasive Test Results: Intermediate-risk stress test findings: cardiac mortality 1-3%/year  Prior CABG: No previous CABG       [name] 09/28/2015 9:49 AM

## 2015-09-28 NOTE — H&P (View-Only) (Signed)
HPI The patient returns for follow up of CAD which is being managed medically.   He has a history of an LAD stenosis of 50% demonstrated in December 2014. He had a normal  FFR at that time.  He was in the emergency room however on the sixth of this month with chest discomfort. He was seen in consultation. It was felt to be somewhat atypical pain. He had some chest discomfort with burping. It did not respond to treatment with an antacid. His cardiac markers were negative in the ER. He did report having used nitroglycerin about one month prior to this. He had a stress perfusion study done 2 days ago which demonstrated an EF of 56%. There was a medium-sized moderate defect in the basal inferoseptal mid anteroseptal and mid inferoseptal apical region.  He came back today to discuss this and did have a description of some further discomfort last few days.  This has been more mild and discomfort brought him to the emergency room however.  No Known Allergies  Current Outpatient Prescriptions  Medication Sig Dispense Refill  . acetaminophen (TYLENOL) 500 MG tablet Take 1,000 mg by mouth every 6 (six) hours as needed for mild pain or moderate pain.    [name] aspirin EC 81 MG EC tablet Take 1 tablet (81 mg total) by mouth daily.    [name] ibuprofen (ADVIL,MOTRIN) 200 MG tablet Take 400 mg by mouth every 6 (six) hours as needed for mild pain or moderate pain.    [name] levothyroxine (SYNTHROID, LEVOTHROID) 100 MCG tablet Take 1 tablet (100 mcg total) by mouth daily. 30 tablet 11  . losartan (COZAAR) 100 MG tablet Take 1 tablet (100 mg total) by mouth daily. 30 tablet 11  . metFORMIN (GLUCOPHAGE) 500 MG tablet TAKE 1 TABLET BY MOUTH 2 TIMES DAILY WITH A MEAL. (Patient taking differently: Take 500 mg by mouth 2 (two) times daily with a meal. ) 60 tablet 11  . Multiple Vitamins-Minerals (MULTIVITAMIN,TX-MINERALS) tablet Take 1 tablet by mouth daily.      . nitroGLYCERIN (NITROSTAT) 0.4 MG SL tablet Place 1 tablet (0.4 mg  total) under the tongue every 5 (five) minutes as needed for chest pain (CP or SOB). 25 tablet 3  . pantoprazole (PROTONIX) 40 MG tablet Take 1 tablet (40 mg total) by mouth daily. 30 tablet 11  . simvastatin (ZOCOR) 40 MG tablet Take 1 tablet (40 mg total) by mouth at bedtime. 30 tablet 11   No current facility-administered medications for this visit.    Past Medical History  Diagnosis Date  . Hypertension     a. Dx 3y ago.  [name] Hyperlipidemia     a. Dx 3y ago - not on statin.  [name] GERD (gastroesophageal reflux disease)   . Hypothyroidism     a. on replacement.  . Obesity   . Snoring   . Chest pain     a. 08/2013 Cardia CTA: Ca score 238 (95%), LM nl, LAD mod stenosis in D2 area, D1 mild ost stenosis, D2 no signif dzs, LCX small, RCA nl.  . Type II diabetes mellitus ([place])   . Arthritis     "joints; from where I've had surgeries" (08/17/2013)  . Coronary artery disease     LHC (08/18/13):  pLAD 50, CFX and RCA normal.  EF 55-65%.  LAD lesion FFR:  0.88 (normal).  Med rx recommended.    . Sleep apnea   . Dysphagia     Upper endoscopy 11/2013 -  without obvious stricture s/p Maloney dilation.     Past Surgical History  Procedure Laterality Date  . Shoulder arthroscopy w/ rotator cuff repair Left   . Knee arthroscopy Right X 2  . Ankle surgery Right     "had a growth in it; cut it out" (08/17/2013)  . Cholecystectomy    . Left heart catheterization with coronary angiogram N/A 08/18/2013    Procedure: LEFT HEART CATHETERIZATION WITH CORONARY ANGIOGRAM;  Surgeon: Peter M Martinique, MD;  Location: [facility] CATH LAB;  Service: Cardiovascular;  Laterality: N/A;    ROS:  As stated in the HPI and negative for all other systems.  PHYSICAL EXAM BP 130/76 mmHg  Pulse 90  Ht 5\' 9"  (1.753 m)  Wt 315 lb 5 oz (143.025 kg)  BMI 46.54 kg/m2 GENERAL:  Well appearing NECK:  No jugular venous distention, waveform within normal limits, carotid upstroke brisk and symmetric, no bruits, no  thyromegaly LYMPHATICS:  No cervical, inguinal adenopathy LUNGS:  Clear to auscultation bilaterally BACK:  No CVA tenderness CHEST:  Unremarkable HEART:  PMI not displaced or sustained,S1 and S2 within normal limits, no S3, no S4, no clicks, no rubs, no murmurs ABD:  Flat, positive bowel sounds normal in frequency in pitch, no bruits, no rebound, no guarding, no midline pulsatile mass, no hepatomegaly, no splenomegaly, obese EXT:  2 plus pulses throughout, no edema, no cyanosis no clubbing    ASSESSMENT AND PLAN  CAD:  Given the chest discomfort and the abnormal stress test further testing is indicated. He will need a cardiac catheterization. The patient understands that risks included but are not limited to stroke (1 in 1000), death (1 in 75), kidney failure [usually temporary] (1 in 500), bleeding (1 in 200), allergic reaction [possibly serious] (1 in 200).  The patient understands and agrees to proceed.     GERD:  He will continue with Protonix.   Snoring:  He does have sleep apnea but might have trouble affording CPAP.  Hypertension:  The blood pressure is at target. No change in medications is indicated. We will continue with therapeutic lifestyle changes (TLC).  Hyperlipidemia:  He will continue with meds as listed.    I would like to repeat a lipid profile when he comes back for follow-up area he had an excellent profile in 2015.  Diabetes Mellitus:  F/u with primary care.  Obesity:  We discussed the importance of weight loss and different strategies.

## 2015-09-28 NOTE — Research (Signed)
CADLAD Informed Consent   Subject Name: Gabriel [name]  Subject met inclusion and exclusion criteria.  The informed consent form, study requirements and expectations were reviewed with the subject and questions and concerns were addressed prior to the signing of the consent form.  The subject verbalized understanding of the trial requirements.  The subject agreed to participate in the CADLAD trial and signed the informed consent.  The informed consent was obtained prior to performance of any protocol-specific procedures for the subject.  A copy of the signed informed consent was given to the subject and a copy was placed in the subject's medical record.  Berneda Rose 09/28/2015, 10:05 AM

## 2015-09-28 NOTE — Discharge Instructions (Signed)
Radial Site Care °Refer to this sheet in the next few weeks. These instructions provide you with information about caring for yourself after your procedure. Your health care provider may also give you more specific instructions. Your treatment has been planned according to current medical practices, but problems sometimes occur. Call your health care provider if you have any problems or questions after your procedure. °WHAT TO EXPECT AFTER THE PROCEDURE °After your procedure, it is typical to have the following: °· Bruising at the radial site that usually fades within 1-2 weeks. °· Blood collecting in the tissue (hematoma) that may be painful to the touch. It should usually decrease in size and tenderness within 1-2 weeks. °HOME CARE INSTRUCTIONS °· Take medicines only as directed by your health care provider. °· You may shower 24-48 hours after the procedure or as directed by your health care provider. Remove the bandage (dressing) and gently wash the site with plain soap and water. Pat the area dry with a clean towel. Do not rub the site, because this may cause bleeding. °· Do not take baths, swim, or use a hot tub until your health care provider approves. °· Check your insertion site every day for redness, swelling, or drainage. °· Do not apply powder or lotion to the site. °· Do not flex or bend the affected arm for 24 hours or as directed by your health care provider. °· Do not push or pull heavy objects with the affected arm for 24 hours or as directed by your health care provider. °· Do not lift over 10 lb (4.5 kg) for 5 days after your procedure or as directed by your health care provider. °· Ask your health care provider when it is okay to: °¨ Return to work or school. °¨ Resume usual physical activities or sports. °¨ Resume sexual activity. °· Do not drive home if you are discharged the same day as the procedure. Have someone else drive you. °· You may drive 24 hours after the procedure unless otherwise  instructed by your health care provider. °· Do not operate machinery or power tools for 24 hours after the procedure. °· If your procedure was done as an outpatient procedure, which means that you went home the same day as your procedure, a responsible adult should be with you for the first 24 hours after you arrive home. °· Keep all follow-up visits as directed by your health care provider. This is important. °SEEK MEDICAL CARE IF: °· You have a fever. °· You have chills. °· You have increased bleeding from the radial site. Hold pressure on the site. °SEEK IMMEDIATE MEDICAL CARE IF: °· You have unusual pain at the radial site. °· You have redness, warmth, or swelling at the radial site. °· You have drainage (other than a small amount of blood on the dressing) from the radial site. °· The radial site is bleeding, and the bleeding does not stop after 30 minutes of holding steady pressure on the site. °· Your arm or hand becomes pale, cool, tingly, or numb. °  °This information is not intended to replace advice given to you by your health care provider. Make sure you discuss any questions you have with your health care provider. °  °Document Released: 09/28/2010 Document Revised: 09/16/2014 Document Reviewed: 03/14/2014 °Elsevier Interactive Patient Education ©2016 [employer] ° °

## 2015-10-08 NOTE — Progress Notes (Signed)
HPI The patient returns for follow up of CAD which is being managed medically.   He has a history of an LAD stenosis of 50% demonstrated in December 2014. He had a normal  FFR at that time.  He was in the emergency room however on the sixth of this month with chest discomfort. He was seen in consultation. It was felt to be somewhat atypical pain. He had some chest discomfort with burping. It did not respond to treatment with an antacid. His cardiac markers were negative in the ER. He did report having used nitroglycerin about one month prior to this. He had a stress perfusion study done 2 days ago which demonstrated an EF of 56%. There was a medium-sized moderate defect in the basal inferoseptal mid anteroseptal and mid inferoseptal apical region.  He had a cardiac cath on 1/19 and was found to have 40% LAD stenosis.  He presents for follow up.  He has had no new chest pain.  He denies any new SOB, PND or orthopnea.    No Known Allergies  Current Outpatient Prescriptions  Medication Sig Dispense Refill  . acetaminophen (TYLENOL) 500 MG tablet Take 1,000 mg by mouth every 6 (six) hours as needed for mild pain or moderate pain.    [name] aspirin EC 81 MG EC tablet Take 1 tablet (81 mg total) by mouth daily.    [name] ibuprofen (ADVIL,MOTRIN) 200 MG tablet Take 400 mg by mouth every 6 (six) hours as needed for mild pain or moderate pain.    [name] levothyroxine (SYNTHROID, LEVOTHROID) 100 MCG tablet Take 1 tablet (100 mcg total) by mouth daily. 30 tablet 11  . losartan (COZAAR) 100 MG tablet Take 1 tablet (100 mg total) by mouth daily. 30 tablet 11  . Multiple Vitamins-Minerals (MULTIVITAMIN,TX-MINERALS) tablet Take 1 tablet by mouth daily.      . nitroGLYCERIN (NITROSTAT) 0.4 MG SL tablet Place 1 tablet (0.4 mg total) under the tongue every 5 (five) minutes as needed for chest pain (CP or SOB). 25 tablet 3  . pantoprazole (PROTONIX) 40 MG tablet Take 1 tablet (40 mg total) by mouth daily. 30 tablet 11  .  simvastatin (ZOCOR) 40 MG tablet Take 1 tablet (40 mg total) by mouth at bedtime. 30 tablet 11   No current facility-administered medications for this visit.    Past Medical History  Diagnosis Date  . Hypertension     a. Dx 3y ago.  [name] Hyperlipidemia     a. Dx 3y ago - not on statin.  [name] GERD (gastroesophageal reflux disease)   . Hypothyroidism     a. on replacement.  . Obesity   . Snoring   . Chest pain     a. 08/2013 Cardia CTA: Ca score 238 (95%), LM nl, LAD mod stenosis in D2 area, D1 mild ost stenosis, D2 no signif dzs, LCX small, RCA nl.  . Type II diabetes mellitus ([place])   . Arthritis     "joints; from where I've had surgeries" (08/17/2013)  . Coronary artery disease     LHC (08/18/13):  pLAD 50, CFX and RCA normal.  EF 55-65%.  LAD lesion FFR:  0.88 (normal).  Med rx recommended.    . Sleep apnea   . Dysphagia     Upper endoscopy 11/2013 - without obvious stricture s/p Maloney dilation.     Past Surgical History  Procedure Laterality Date  . Shoulder arthroscopy w/ rotator cuff repair Left   . Knee arthroscopy  Right X 2  . Ankle surgery Right     "had a growth in it; cut it out" (08/17/2013)  . Cholecystectomy    . Left heart catheterization with coronary angiogram N/A 08/18/2013    Procedure: LEFT HEART CATHETERIZATION WITH CORONARY ANGIOGRAM;  Surgeon: Peter M Martinique, MD;  Location: [facility] CATH LAB;  Service: Cardiovascular;  Laterality: N/A;  . Cardiac catheterization N/A 09/28/2015    Procedure: Left Heart Cath and Coronary Angiography;  Surgeon: Peter M Martinique, MD;  Location: [facility] CV LAB;  Service: Cardiovascular;  Laterality: N/A;    ROS:  As stated in the HPI and negative for all other systems.  PHYSICAL EXAM BP 140/88 mmHg  Pulse 74  Ht 5\' 9"  (1.753 m)  Wt 314 lb 8 oz (142.656 kg)  BMI 46.42 kg/m2 GENERAL:  Well appearing NECK:  No jugular venous distention, waveform within normal limits, carotid upstroke brisk and symmetric, no bruits, no  thyromegaly LYMPHATICS:  No cervical, inguinal adenopathy LUNGS:  Clear to auscultation bilaterally BACK:  No CVA tenderness CHEST:  Unremarkable HEART:  PMI not displaced or sustained,S1 and S2 within normal limits, no S3, no S4, no clicks, no rubs, no murmurs ABD:  Flat, positive bowel sounds normal in frequency in pitch, no bruits, no rebound, no guarding, no midline pulsatile mass, no hepatomegaly, no splenomegaly, obese EXT:  2 plus pulses throughout, no edema, no cyanosis no clubbing, right wrist access site without bruising or bleeding.     ASSESSMENT AND PLAN  CAD:  He has nonobstructive disease.  He needs continued primary risk reduction.  I would like to see him back routinely as he will need to be screened with period POETs (Plain Old Exercise Treadmill) and will need continued education about risk reduction.  He does have an elevated calcium score.    GERD:  He will continue with Protonix.   Snoring:  He does have sleep apnea but is having have trouble affording CPAP.  Hypertension:  The blood pressure is at target. No change in medications is indicated. We will continue with therapeutic lifestyle changes (TLC).  Hyperlipidemia:  He will continue with meds as listed.      He will come back for a fasting lipid profile.    Diabetes Mellitus:  F/u with primary care.  His last A1C was 7.    Obesity:  We discussed the importance of weight loss and different strategies.  We discussed in particular today the need to concentrate on increased activity and fitness.

## 2015-10-09 ENCOUNTER — Encounter: Payer: Self-pay | Admitting: Cardiology

## 2015-10-09 ENCOUNTER — Ambulatory Visit (INDEPENDENT_AMBULATORY_CARE_PROVIDER_SITE_OTHER): Payer: PRIVATE HEALTH INSURANCE | Admitting: Cardiology

## 2015-10-09 VITALS — BP 140/88 | HR 74 | Ht 69.0 in | Wt 314.5 lb

## 2015-10-09 DIAGNOSIS — I2510 Atherosclerotic heart disease of native coronary artery without angina pectoris: Secondary | ICD-10-CM

## 2015-10-09 NOTE — Patient Instructions (Signed)
Your physician wants you to follow-up in: 24 months with Dr.Hochrein. You will receive a reminder letter in the mail two months in advance. If you don't receive a letter, please call our office to schedule the follow-up appointment.

## 2015-10-12 ENCOUNTER — Ambulatory Visit: Payer: Self-pay | Admitting: Physician Assistant

## 2015-10-24 ENCOUNTER — Encounter: Payer: Self-pay | Admitting: Family Medicine

## 2015-10-24 ENCOUNTER — Ambulatory Visit (INDEPENDENT_AMBULATORY_CARE_PROVIDER_SITE_OTHER): Payer: PRIVATE HEALTH INSURANCE | Admitting: Family Medicine

## 2015-10-24 VITALS — BP 110/78 | HR 94 | Temp 98.1°F | Wt 319.0 lb

## 2015-10-24 DIAGNOSIS — M549 Dorsalgia, unspecified: Secondary | ICD-10-CM | POA: Diagnosis not present

## 2015-10-24 DIAGNOSIS — I2510 Atherosclerotic heart disease of native coronary artery without angina pectoris: Secondary | ICD-10-CM

## 2015-10-24 DIAGNOSIS — E119 Type 2 diabetes mellitus without complications: Secondary | ICD-10-CM | POA: Diagnosis not present

## 2015-10-24 DIAGNOSIS — M7712 Lateral epicondylitis, left elbow: Secondary | ICD-10-CM

## 2015-10-24 LAB — POCT GLYCOSYLATED HEMOGLOBIN (HGB A1C): Hemoglobin A1C: 6.7

## 2015-10-24 MED ORDER — MELOXICAM 15 MG PO TABS: 15.0000 mg | ORAL_TABLET | Freq: Every day | ORAL | Status: DC

## 2015-10-24 NOTE — Progress Notes (Signed)
Garret Reddish, MD  Subjective:  Gabriel [name] is a 50 y.o. year old very pleasant male patient who presents for/with See problem oriented charting ROS- no fever/chills/cough/shortness of breath  Past Medical History-  Patient Active Problem List   Diagnosis Date Noted  . Coronary Artery Disease 08/26/2013    Priority: High  . Diabetes mellitus ([place]) 08/10/2013    Priority: High  . 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 Morbid obesity ([place]) 08/10/2013    Priority: Medium  . HTN (hypertension) 02/06/2011    Priority: Medium  . Hypothyroidism 02/06/2011    Priority: Medium  . Hyperlipidemia 02/06/2011    Priority: Medium  . GERD (gastroesophageal reflux disease) 02/01/2015    Priority: Low  . Dysphagia 09/30/2013    Priority: Low  . Abnormal nuclear stress test 09/28/2015  . Abnormal stress test 09/22/2015  . Chest pain 08/04/2015  . Abdominal bloating 08/04/2015    Medications- reviewed and updated Current Outpatient Prescriptions  Medication Sig Dispense Refill  . aspirin EC 81 MG EC tablet Take 1 tablet (81 mg total) by mouth daily.    [name] levothyroxine (SYNTHROID, LEVOTHROID) 100 MCG tablet Take 1 tablet (100 mcg total) by mouth daily. 30 tablet 11  . losartan (COZAAR) 100 MG tablet Take 1 tablet (100 mg total) by mouth daily. 30 tablet 11  . Multiple Vitamins-Minerals (MULTIVITAMIN,TX-MINERALS) tablet Take 1 tablet by mouth daily.      . pantoprazole (PROTONIX) 40 MG tablet Take 1 tablet (40 mg total) by mouth daily. 30 tablet 11  . simvastatin (ZOCOR) 40 MG tablet Take 1 tablet (40 mg total) by mouth at bedtime. 30 tablet 11  . acetaminophen (TYLENOL) 500 MG tablet Take 1,000 mg by mouth every 6 (six) hours as needed for mild pain or moderate pain. Reported on 10/24/2015    . ibuprofen (ADVIL,MOTRIN) 200 MG tablet Take 400 mg by mouth every 6 (six) hours as needed for mild pain or moderate pain. Reported on 10/24/2015    . meloxicam (MOBIC) 15  MG tablet Take 1 tablet (15 mg total) by mouth daily. 30 tablet 0  . nitroGLYCERIN (NITROSTAT) 0.4 MG SL tablet Place 1 tablet (0.4 mg total) under the tongue every 5 (five) minutes as needed for chest pain (CP or SOB). (Patient not taking: Reported on 10/24/2015) 25 tablet 3   No current facility-administered medications for this visit.    Objective: BP 110/78 mmHg  Pulse 94  Temp(Src) 98.1 F (36.7 C)  Wt 319 lb (144.697 kg) Gen: NAD, resting comfortably CV: RRR no murmurs rubs or gallops Lungs: CTAB no crackles, wheeze, rhonchi Abdomen: soft/nontender/nondistended/normal bowel sounds. No rebound or guarding.  MSK: mid upper back pain to right- muscles somewhat tight but difficult to fully palpate due to adiposity. Pain is reproducible in this area.  Tender over left lateral epicondyle worse with resisted dorsiflexion Ext: no edema Skin: warm, dry Neuro: grossly normal, moves all extremities  Assessment/Plan:  Left lateral epicondylitis S: Left lateral elbow pain for 3 weeks up to 5/10. Worse with twisting motion. Better with ibuprofen when he takes it. 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 Pain worsening over time. Has some mild pain at bottom of biceps on right side as well A/P: icing/counterforce brace/mobic x 7 days/home exercise program- instructions per AVS.   Back strain  S:1.5 weks of pain between shoulder blades most focused on  right side. 5/10 as well. Ibuprofen helps. Worse with twisting at times or with deep breath A/P: his concern was pneumonia but has no symptoms other than the back pain. It is also tender on exam and I think unlikely. Suspect MSK strain/spasm. Treat with mobic x 7 days. Follow up 3-4 weeks to discuss diabetes. Consider massage therapy.   Of note, patient has CAD. Most recent cath with 40% nonobstructive CAD. We discussed increased cardiac risk. Did not go with  prednisone instead due to diabetes and morbid obesity though   Advised 3-4 weeks for DM follow up. DID a1c today in preparation for this visit Lab Results  Component Value Date   HGBA1C 6.7 10/24/2015  Return precautions advised.   Orders Placed This Encounter  Procedures  . POC HgB A1c   Meds ordered this encounter  Medications  . meloxicam (MOBIC) 15 MG tablet    Sig: Take 1 tablet (15 mg total) by mouth daily.    Dispense:  30 tablet    Refill:  0

## 2015-10-24 NOTE — Patient Instructions (Signed)
Left tennis elbow Ice 20 minutes 3 x a day for 3 days then switch to heat Start exercises at day 5 Get a band to go on forearm to remove pressure- tennis elbow band/counterforce brace Take Meloxicam for 7 days in a row. No ibuprofen/aleve/motrin with this. Tylenol only if you need something for pain in addition  I think this will help your back. I did not hear any pneumonia and you are tender with pushing- suspect muscle spasm/strain. Take it easy on lifting. Consider massage therapy.   If in 2-3 weeks not improved by at least 50% return to see me

## 2015-10-27 LAB — HM DIABETES EYE EXAM

## 2015-10-31 ENCOUNTER — Encounter: Payer: Self-pay | Admitting: Family Medicine

## 2016-01-08 ENCOUNTER — Encounter: Payer: Self-pay | Admitting: Family Medicine

## 2016-01-08 ENCOUNTER — Ambulatory Visit (INDEPENDENT_AMBULATORY_CARE_PROVIDER_SITE_OTHER): Payer: PRIVATE HEALTH INSURANCE | Admitting: Family Medicine

## 2016-01-08 VITALS — BP 110/70 | HR 90 | Temp 98.3°F | Wt 311.0 lb

## 2016-01-08 DIAGNOSIS — R358 Other polyuria: Secondary | ICD-10-CM

## 2016-01-08 DIAGNOSIS — R7309 Other abnormal glucose: Secondary | ICD-10-CM

## 2016-01-08 DIAGNOSIS — E1365 Other specified diabetes mellitus with hyperglycemia: Secondary | ICD-10-CM | POA: Diagnosis not present

## 2016-01-08 DIAGNOSIS — R3589 Other polyuria: Secondary | ICD-10-CM

## 2016-01-08 LAB — POCT URINALYSIS DIPSTICK
BILIRUBIN UA: NEGATIVE
Glucose, UA: 2
Ketones, UA: 5
Leukocytes, UA: NEGATIVE
NITRITE UA: NEGATIVE
PH UA: 6
RBC UA: NEGATIVE
Spec Grav, UA: 1.01
UROBILINOGEN UA: 0.2

## 2016-01-08 LAB — POCT GLUCOSE (DEVICE FOR HOME USE): POC Glucose: 419 mg/dl — AB (ref 70–99)

## 2016-01-08 LAB — POCT GLYCOSYLATED HEMOGLOBIN (HGB A1C): HEMOGLOBIN A1C: 10.1

## 2016-01-08 MED ORDER — INSULIN GLARGINE 300 UNIT/ML ~~LOC~~ SOPN: 10.0000 [IU] | PEN_INJECTOR | SUBCUTANEOUS | Status: DC

## 2016-01-08 NOTE — Patient Instructions (Signed)
Please check blood sugar at home before bedtime and in the morning before you eat breakfast tomorrow. Document these readings and bring to Dr. Yong Channel tomorrow when you see him for follow up. Today, you received insulin to decrease your blood sugar. Also, please take your Metformin  tonight as prescribed for you and use your new glucose monitor to check your blood sugar daily.  Dr. Yong Channel will follow up with you tomorrow and establish a plan of care for you.  Blood Glucose Monitoring, Adult Monitoring your blood glucose (also know as blood sugar) helps you to manage your diabetes. It also helps you and your health care provider monitor your diabetes and determine how well your treatment plan is working. WHY SHOULD YOU MONITOR YOUR BLOOD GLUCOSE?  It can help you understand how food, exercise, and medicine affect your blood glucose.  It allows you to know what your blood glucose is at any given moment. You can quickly tell if you are having low blood glucose (hypoglycemia) or high blood glucose (hyperglycemia).  It can help you and your health care provider know how to adjust your medicines.  It can help you understand how to manage an illness or adjust medicine for exercise. WHEN SHOULD YOU TEST? Your health care provider will help you decide how often you should check your blood glucose. This may depend on the type of diabetes you have, your diabetes control, or the types of medicines you are taking. Be sure to write down all of your blood glucose readings so that this information can be reviewed with your health care provider. See below for examples of testing times that your health care provider may suggest. Type 1 Diabetes  Test at least 2 times per day if your diabetes is well controlled, if you are using an insulin pump, or if you perform multiple daily injections.  If your diabetes is not well controlled or if you are sick, you may need to test more often.  It is a good idea to also  test:  Before every insulin injection.  Before and after exercise.  Between meals and 2 hours after a meal.  Occasionally between 2:00 a.m. and 3:00 a.m. Type 2 Diabetes  If you are taking insulin, test at least 2 times per day. However, it is best to test before every insulin injection.  If you take medicines by mouth (orally), test 2 times a day.  If you are on a controlled diet, test once a day.  If your diabetes is not well controlled or if you are sick, you may need to monitor more often. HOW TO MONITOR YOUR BLOOD GLUCOSE Supplies Needed  Blood glucose meter.  Test strips for your meter. Each meter has its own strips. You must use the strips that go with your own meter.  A pricking needle (lancet).  A device that holds the lancet (lancing device).  A journal or log book to write down your results. Procedure  Wash your hands with soap and water. Alcohol is not preferred.  Prick the side of your finger (not the tip) with the lancet.  Gently milk the finger until a small drop of blood appears.  Follow the instructions that come with your meter for inserting the test strip, applying blood to the strip, and using your blood glucose meter. Other Areas to Get Blood for Testing Some meters allow you to use other areas of your body (other than your finger) to test your blood. These areas are called alternative  sites. The most common alternative sites are:  The forearm.  The thigh.  The back area of the lower leg.  The palm of the hand. The blood flow in these areas is slower. Therefore, the blood glucose values you get may be delayed, and the numbers are different from what you would get from your fingers. Do not use alternative sites if you think you are having hypoglycemia. Your reading will not be accurate. Always use a finger if you are having hypoglycemia. Also, if you cannot feel your lows (hypoglycemia unawareness), always use your fingers for your blood glucose  checks. ADDITIONAL TIPS FOR GLUCOSE MONITORING  Do not reuse lancets.  Always carry your supplies with you.  All blood glucose meters have a 24-hour "hotline" number to call if you have questions or need help.  Adjust (calibrate) your blood glucose meter with a control solution after finishing a few boxes of strips. BLOOD GLUCOSE RECORD KEEPING It is a good idea to keep a daily record or log of your blood glucose readings. Most glucose meters, if not all, keep your glucose records stored in the meter. Some meters come with the ability to download your records to your home computer. Keeping a record of your blood glucose readings is especially helpful if you are wanting to look for patterns. Make notes to go along with the blood glucose readings because you might forget what happened at that exact time. Keeping good records helps you and your health care provider to work together to achieve good diabetes management.    This information is not intended to replace advice given to you by your health care provider. Make sure you discuss any questions you have with your health care provider.   Document Released: 08/29/2003 Document Revised: 09/16/2014 Document Reviewed: 01/18/2013 Elsevier Interactive Patient Education [employer].

## 2016-01-08 NOTE — Progress Notes (Signed)
Pre visit review using our clinic review tool, if applicable. No additional management support is needed unless otherwise documented below in the visit note. 

## 2016-01-08 NOTE — Progress Notes (Signed)
Subjective:    Patient ID: Gabriel [name], male    DOB: 1965-12-17, 50 y.o.   MRN: [id number]  HPI  Mr. Spickard is a 50 year old male who presents today with polyuria. He reports drinking one gallon per day of water and can often add one glass of juice per day. Symptoms have been present for one week. Associated symptoms of frequency with a history of one episode of hematuria that occurred 3 to 4 months ago however has not occurred since that time. He reports drinking 60 oz of sweet tea in addition to the water mentioned above.  Blood sugars at home are ranging 90s per patient report and POCT Hgb A1C is noted at 10.1 today which has increased from last A1C on 10/24/2015 of 6.7. CBG in office today is 419.   He reports polyuria and polydipsia but no polyphagia. Denies any episodes of hypoglycemia, vision changes, feet or hand numbness/tingling.  He reports stopping metformin during treatment of lateral epicondylitis for one week but  resumed Metformin 500mg  BID. Patient reports eating bread, baked potatoes, and starches and does not have a regular exercise program.     Review of Systems  Constitutional: Negative for fever, chills, appetite change and fatigue.  Eyes: Negative for visual disturbance.  Respiratory: Negative for cough, shortness of breath and wheezing.   Cardiovascular: Negative for chest pain and palpitations.  Gastrointestinal: Negative for nausea, vomiting, abdominal pain, diarrhea and constipation.  Endocrine: Positive for polydipsia and polyuria. Negative for polyphagia.  Genitourinary: Positive for frequency. Negative for dysuria, urgency, flank pain and testicular pain.  Skin: Negative for rash.  Neurological: Negative for dizziness, light-headedness, numbness and headaches.  Psychiatric/Behavioral:       Denies depressed or anxious mood   Past Medical History  Diagnosis Date  . Hypertension     a. Dx 3y ago.  [name] Hyperlipidemia     a. Dx 3y ago - not on statin.    [name] GERD (gastroesophageal reflux disease)   . Hypothyroidism     a. on replacement.  . Obesity   . Snoring   . Chest pain     a. 08/2013 [facility] CTA: Ca score 238 (95%), LM nl, LAD mod stenosis in D2 area, D1 mild ost stenosis, D2 no signif dzs, LCX small, RCA nl.  . Type II diabetes mellitus ([place])   . Arthritis     "joints; from where I've had surgeries" (08/17/2013)  . Coronary artery disease     LHC (08/18/13):  pLAD 50, CFX and RCA normal.  EF 55-65%.  LAD lesion FFR:  0.88 (normal).  Med rx recommended.    . Sleep apnea   . Dysphagia     Upper endoscopy 11/2013 - without obvious stricture s/p Maloney dilation.      Social History   Social History  . Marital Status: Single    Spouse Name: N/A  . Number of Children: 0  . Years of Education: N/A   Occupational History  . Unemployed    Social History Main Topics  . Smoking status: Former Smoker -- 1.50 packs/day for 24 years    Types: Cigarettes    Quit date: 09/10/2007  . Smokeless tobacco: Never Used  . Alcohol Use: No  . Drug Use: No  . Sexual Activity: Yes   Other Topics Concern  . Not on file   Social History Narrative   Lives in [place] with parents (cares for parents).  Never married. Unemployed.  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[employer] (stops intermittently while caring for parents)    Past Surgical History  Procedure Laterality Date  . Shoulder arthroscopy w/ rotator cuff repair Left   . Knee arthroscopy Right X 2  . Ankle surgery Right     "had a growth in it; cut it out" (08/17/2013)  . Cholecystectomy    . Left heart catheterization with coronary angiogram N/A 08/18/2013    Procedure: LEFT HEART CATHETERIZATION WITH CORONARY ANGIOGRAM;  Surgeon: Peter M Martinique, MD;  Location: [facility] CATH LAB;  Service: Cardiovascular;  Laterality: N/A;  . Cardiac catheterization N/A 09/28/2015    Procedure: Left Heart Cath and Coronary Angiography;  Surgeon: Peter M Martinique, MD;  Location: [facility];  Service:  Cardiovascular;  Laterality: N/A;    Family History  Problem Relation Age of Onset  . Adopted: Yes  . Coronary artery disease Father     s/p MI in his early 30's->CABG x 5 @ age 35, currently 32.  . Diabetes Father   . Stroke Father   . Hypertension Father   . Hypertension Mother   . Obesity Mother   . Diabetes Mother   . Diabetes Brother   . Obesity Brother   . Asthma Maternal Grandfather     No Known Allergies  Current Outpatient Prescriptions on File Prior to Visit  Medication Sig Dispense Refill  . acetaminophen (TYLENOL) 500 MG tablet Take 1,000 mg by mouth every 6 (six) hours as needed for mild pain or moderate pain. Reported on 10/24/2015    . aspirin EC 81 MG EC tablet Take 1 tablet (81 mg total) by mouth daily.    [name] ibuprofen (ADVIL,MOTRIN) 200 MG tablet Take 400 mg by mouth every 6 (six) hours as needed for mild pain or moderate pain. Reported on 10/24/2015    . levothyroxine (SYNTHROID, LEVOTHROID) 100 MCG tablet Take 1 tablet (100 mcg total) by mouth daily. 30 tablet 11  . losartan (COZAAR) 100 MG tablet Take 1 tablet (100 mg total) by mouth daily. 30 tablet 11  . meloxicam (MOBIC) 15 MG tablet Take 1 tablet (15 mg total) by mouth daily. 30 tablet 0  . Multiple Vitamins-Minerals (MULTIVITAMIN,TX-MINERALS) tablet Take 1 tablet by mouth daily.      . nitroGLYCERIN (NITROSTAT) 0.4 MG SL tablet Place 1 tablet (0.4 mg total) under the tongue every 5 (five) minutes as needed for chest pain (CP or SOB). 25 tablet 3  . pantoprazole (PROTONIX) 40 MG tablet Take 1 tablet (40 mg total) by mouth daily. 30 tablet 11  . simvastatin (ZOCOR) 40 MG tablet Take 1 tablet (40 mg total) by mouth at bedtime. 30 tablet 11   No current facility-administered medications on file prior to visit.    BP 110/70 mmHg  Pulse 90  Temp(Src) 98.3 F (36.8 C) (Oral)  Wt 311 lb (141.069 kg)       Objective:   Physical Exam  Constitutional: He is oriented to person, place, and time. He appears  well-developed and well-nourished.  Eyes: Pupils are equal, round, and reactive to light.  Neck: Neck supple.  Cardiovascular: Normal rate, regular rhythm and intact distal pulses.   Pulmonary/Chest: Effort normal and breath sounds normal. He has no wheezes. He has no rales.  Abdominal: Soft. Bowel sounds are normal. He exhibits no distension. There is no tenderness. There is no CVA tenderness.  No suprapubic tenderness noted  Musculoskeletal: He exhibits no edema.  Lymphadenopathy:    He has no  cervical adenopathy.  Neurological: He is alert and oriented to person, place, and time.  Skin: Skin is warm and dry. No rash noted.  Psychiatric: He has a normal mood and affect. His behavior is normal. Judgment and thought content normal.       Assessment & Plan:  1. Other specified diabetes mellitus with hyperglycemia ([place]) POCT A1C is 10.1 and CBG is 419 today. Administered 10 U of insuline glargine in office today and scheduled appointment tomorrow for follow up with PCP for further evaluation and management of diabetes. Advised patient to check his blood sugar at home this evening before bedtime and in the morning before breakfast with new CBG monitor provided to him today. Discussed signs/symptoms of hypo/hyperglycemia and provided additional written information for his review. Stressed the importance of documenting CBG readings to bring tomorrow for his PCP to  review. Also reminded him to take his metformin this evening as prescribed by his PCP.  - Insulin Glargine (TOUJEO SOLOSTAR) 300 UNIT/ML SOPN; Inject 10 Units into the skin now.  Dispense: 1 pen; Refill: 0  2. Polyuria  - POCT glycosylated hemoglobin (Hb A1C): 10.1 - POC Urinalysis Dipstick: Negative for leukocytes and nitrites. Glucose 2+ and ketones are present. - POCT Glucose (Device for Home Use)  3. Elevated hemoglobin A1c measurement Provided instruction regarding CBG monitoring, dietary choices such as avoiding processed  sugar and modifying carbohydrate intake. Written material provided to patient today with a focus on the importance of controlling blood sugar and long term complications of diabetes. Patient will follow up tomorrow as requested. Appointment has been scheduled.  Delano Metz, FNP-C

## 2016-01-09 ENCOUNTER — Ambulatory Visit (INDEPENDENT_AMBULATORY_CARE_PROVIDER_SITE_OTHER): Payer: PRIVATE HEALTH INSURANCE | Admitting: Family Medicine

## 2016-01-09 ENCOUNTER — Other Ambulatory Visit: Payer: Self-pay | Admitting: *Deleted

## 2016-01-09 ENCOUNTER — Encounter: Payer: Self-pay | Admitting: Family Medicine

## 2016-01-09 VITALS — BP 110/78 | HR 92 | Temp 98.5°F | Wt 310.0 lb

## 2016-01-09 DIAGNOSIS — E1365 Other specified diabetes mellitus with hyperglycemia: Secondary | ICD-10-CM

## 2016-01-09 DIAGNOSIS — E1165 Type 2 diabetes mellitus with hyperglycemia: Secondary | ICD-10-CM | POA: Diagnosis not present

## 2016-01-09 DIAGNOSIS — E119 Type 2 diabetes mellitus without complications: Secondary | ICD-10-CM

## 2016-01-09 DIAGNOSIS — IMO0001 Reserved for inherently not codable concepts without codable children: Secondary | ICD-10-CM

## 2016-01-09 DIAGNOSIS — Z794 Long term (current) use of insulin: Secondary | ICD-10-CM

## 2016-01-09 LAB — GLUCOSE, POCT (MANUAL RESULT ENTRY): POC GLUCOSE: 345 mg/dL — AB (ref 70–99)

## 2016-01-09 MED ORDER — INSULIN PEN NEEDLE 31G X 8 MM MISC: 1.0000 | Freq: Every day | Status: DC

## 2016-01-09 MED ORDER — ONETOUCH ULTRASOFT LANCETS MISC: Status: AC

## 2016-01-09 MED ORDER — GLUCOSE BLOOD VI STRP: ORAL_STRIP | Status: AC

## 2016-01-09 MED ORDER — INSULIN GLARGINE 300 UNIT/ML ~~LOC~~ SOPN: 10.0000 [IU] | PEN_INJECTOR | Freq: Every day | SUBCUTANEOUS | Status: DC

## 2016-01-09 NOTE — Patient Instructions (Addendum)
I want you to give yourself 12 units of insulin tonight. The plan will be to give yourself 12 units everynight unless one of the following occurs.  1. I increase your dose 2. You have a sugar under 80 at any point- you need to call us immediately for instructions if this happens  Please check your sugar tomorrow morning and send me a mychart message with all of your sugars tomorrow morning. I will message you by Thursday at latest to let you know if I want you to adjust further.   Water should be the only thing you are drinking right now- no soda, grape fruit or cranberry juice.   See information below on low blood sugar  Hypoglycemia Hypoglycemia occurs when the glucose in your blood is too low. Glucose is a type of sugar that is your body's main energy source. Hormones, such as insulin and glucagon, control the level of glucose in the blood. Insulin lowers blood glucose and glucagon increases blood glucose. Having too much insulin in your blood stream, or not eating enough food containing sugar, can result in hypoglycemia. Hypoglycemia can happen to people with or without diabetes. It can develop quickly and can be a medical emergency.  CAUSES   Missing or delaying meals.  Not eating enough carbohydrates at meals.  Taking too much diabetes medicine.  Not timing your oral diabetes medicine or insulin doses with meals, snacks, and exercise.  Nausea and vomiting.  Certain medicines.  Severe illnesses, such as hepatitis, kidney disorders, and certain eating disorders.  Increased activity or exercise without eating something extra or adjusting medicines.  Drinking too much alcohol.  A nerve disorder that affects body functions like your heart rate, blood pressure, and digestion (autonomic neuropathy).  A condition where the stomach muscles do not function properly (gastroparesis). Therefore, medicines and food may not absorb properly.  Rarely, a tumor of the pancreas can produce too  much insulin. SYMPTOMS   Hunger.  Sweating (diaphoresis).  Change in body temperature.  Shakiness.  Headache.  Anxiety.  Lightheadedness.  Irritability.  Difficulty concentrating.  Dry mouth.  Tingling or numbness in the hands or feet.  Restless sleep or sleep disturbances.  Altered speech and coordination.  Change in mental status.  Seizures or prolonged convulsions.  Combativeness.  Drowsiness (lethargic).  Weakness.  Increased heart rate or palpitations.  Confusion.  Pale, gray skin color.  Blurred or double vision.  Fainting. DIAGNOSIS  A physical exam and medical history will be performed. Your caregiver may make a diagnosis based on your symptoms. Blood tests and other lab tests may be performed to confirm a diagnosis. Once the diagnosis is made, your caregiver will see if your signs and symptoms go away once your blood glucose is raised.  TREATMENT  Usually, you can easily treat your hypoglycemia when you notice symptoms.  Check your blood glucose. If it is less than 70 mg/dl, take one of the following:   3-4 glucose tablets.    cup juice.    cup regular soda.   1 cup skim milk.   -1 tube of glucose gel.   5-6 hard candies.   Avoid high-fat drinks or food that may delay a rise in blood glucose levels.  Do not take more than the recommended amount of sugary foods, drinks, gel, or tablets. Doing so will cause your blood glucose to go too high.   Wait 10-15 minutes and recheck your blood glucose. If it is still less than 70 mg/dl or  below your target range, repeat treatment.   Eat a snack if it is more than 1 hour until your next meal.  There may be a time when your blood glucose may go so low that you are unable to treat yourself at home when you start to notice symptoms. You may need someone to help you. You may even faint or be unable to swallow. If you cannot treat yourself, someone will need to bring you to the hospital.   [place]  If you have diabetes, follow your diabetes management plan by:  Taking your medicines as directed.  Following your exercise plan.  Following your meal plan. Do not skip meals. Eat on time.  Testing your blood glucose regularly. Check your blood glucose before and after exercise. If you exercise longer or different than usual, be sure to check blood glucose more frequently.  Wearing your medical alert jewelry that says you have diabetes.  Identify the cause of your hypoglycemia. Then, develop ways to prevent the recurrence of hypoglycemia.  Do not take a hot bath or shower right after an insulin shot.  Always carry treatment with you. Glucose tablets are the easiest to carry.  If you are going to drink alcohol, drink it only with meals.  Tell friends or family members ways to keep you safe during a seizure. This may include removing hard or sharp objects from the area or turning you on your side.  Maintain a healthy weight. SEEK MEDICAL CARE IF:   You are having problems keeping your blood glucose in your target range.  You are having frequent episodes of hypoglycemia.  You feel you might be having side effects from your medicines.  You are not sure why your blood glucose is dropping so low.  You notice a change in vision or a new problem with your vision. SEEK IMMEDIATE MEDICAL CARE IF:   Confusion develops.  A change in mental status occurs.  The inability to swallow develops.  Fainting occurs.   This information is not intended to replace advice given to you by your health care provider. Make sure you discuss any questions you have with your health care provider.   Document Released: 08/26/2005 Document Revised: 08/31/2013 Document Reviewed: 05/02/2015 Elsevier Interactive Patient Education [employer].

## 2016-01-09 NOTE — Assessment & Plan Note (Addendum)
S: started on toujeo yesterday 10 units given CBG in office over 400 and a1c over 10 despite previously with a1c 6.7 2 months ago. Patient states last Friday he started noting he was peeing at least 15 times a day.   Today states CBGBefore breakfast was at 253, after breakfast was closer to 280, Banana posicle for snack and then up over 300. Log of food shows sweet tea, juice, and banana popsicles in his meals. He states he had been drinking a lot of sugar sweetened beverages lately.   He has noted peeing less again since toujeo started but still more than previous A/P: Titrate to 12 units toujeo and then patient to update me tomorrow morning. Will titrate by mychart until fastings 80-120 and see back in 2 weeks. Refer for diabetes education at this time- very poor dietary habits. Advised to inform me any sugar under 80 to give [place] some hypoglycemia cushion and educated on thsi topic as well. Continue metformin. bmet in 2 weeks and may increase metformin to 1g BID  Patient wants to come off insulin and we discussed would need significant weight loss

## 2016-01-09 NOTE — Progress Notes (Signed)
Subjective:  Gabriel [name] is a 50 y.o. year old very pleasant male patient who presents for/with See problem oriented charting ROS- polyuria but improving, polydipsia but improving, no chest pain or shrotness of breath, abdominal pain nausea or vomiting  Past Medical History-  Patient Active Problem List   Diagnosis Date Noted  . Coronary Artery Disease 08/26/2013    Priority: High  . Diabetes mellitus type 2, uncontrolled ([place]) 08/10/2013    Priority: High  . 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 Morbid obesity ([place]) 08/10/2013    Priority: Medium  . HTN (hypertension) 02/06/2011    Priority: Medium  . Hypothyroidism 02/06/2011    Priority: Medium  . Hyperlipidemia 02/06/2011    Priority: Medium  . GERD (gastroesophageal reflux disease) 02/01/2015    Priority: Low  . Dysphagia 09/30/2013    Priority: Low  . Abnormal nuclear stress test 09/28/2015  . Abnormal stress test 09/22/2015  . Chest pain 08/04/2015  . Abdominal bloating 08/04/2015    Medications- reviewed and updated Current Outpatient Prescriptions  Medication Sig Dispense Refill  . aspirin EC 81 MG EC tablet Take 1 tablet (81 mg total) by mouth daily.    . Insulin Glargine (TOUJEO SOLOSTAR) 300 UNIT/ML SOPN Inject 10 Units into the skin now. 1 pen 0  . levothyroxine (SYNTHROID, LEVOTHROID) 100 MCG tablet Take 1 tablet (100 mcg total) by mouth daily. 30 tablet 11  . losartan (COZAAR) 100 MG tablet Take 1 tablet (100 mg total) by mouth daily. 30 tablet 11  . meloxicam (MOBIC) 15 MG tablet Take 1 tablet (15 mg total) by mouth daily. 30 tablet 0  . metFORMIN (GLUCOPHAGE) 500 MG tablet Take 500 mg by mouth 2 (two) times daily with a meal.    . Multiple Vitamins-Minerals (MULTIVITAMIN,TX-MINERALS) tablet Take 1 tablet by mouth daily.      . pantoprazole (PROTONIX) 40 MG tablet Take 1 tablet (40 mg total) by mouth daily. 30 tablet 11  . simvastatin (ZOCOR) 40 MG tablet Take 1 tablet (40 mg  total) by mouth at bedtime. 30 tablet 11  . acetaminophen (TYLENOL) 500 MG tablet Take 1,000 mg by mouth every 6 (six) hours as needed for mild pain or moderate pain. Reported on 01/09/2016    . glucose blood (ONE TOUCH TEST STRIPS) test strip Up to 4 times daily.  Onetouch Verio.  E11.65 100 each 12  . ibuprofen (ADVIL,MOTRIN) 200 MG tablet Take 400 mg by mouth every 6 (six) hours as needed for mild pain or moderate pain. Reported on 01/09/2016    . Insulin Glargine (TOUJEO SOLOSTAR) 300 UNIT/ML SOPN Inject 10 Units into the skin daily. Up to 30 units daily as directed by your doctor 5 pen 3  . Insulin Pen Needle 31G X 8 MM MISC 1 each by Does not apply route daily. 100 each 3  . Lancets (ONETOUCH ULTRASOFT) lancets Up to 4 times daily.  Onetouch Verio.  DxE11.65 100 each 12  . nitroGLYCERIN (NITROSTAT) 0.4 MG SL tablet Place 1 tablet (0.4 mg total) under the tongue every 5 (five) minutes as needed for chest pain (CP or SOB). (Patient not taking: Reported on 01/09/2016) 25 tablet 3   No current facility-administered medications for this visit.    Objective: BP 110/78 mmHg  Pulse 92  Temp(Src) 98.5 F (36.9 C)  Wt 310 lb (140.615 kg) Gen: NAD, resting comfortably Mucous membranes are moist. CV: RRR no murmurs rubs or gallops Lungs: CTAB no  crackles, wheeze, rhonchi Abdomen: soft/nontender/nondistended/normal bowel sounds. No rebound or guarding.  Ext: no edema Skin: warm, dry Neuro: grossly normal, moves all extremities  Assessment/Plan:  Diabetes mellitus type 2, uncontrolled ([facility]) S: started on toujeo yesterday 10 units given CBG in office over 400 and a1c over 10 despite previously with a1c 6.7 2 months ago. Patient states last Friday he started noting he was peeing at least 15 times a day.   Today states CBGBefore breakfast was at 253, after breakfast was closer to 280, Banana posicle for snack and then up over 300. Log of food shows sweet tea, juice, and banana popsicles in his meals.  He states he had been drinking a lot of sugar sweetened beverages lately.   He has noted peeing less again since toujeo started but still more than previous A/P: Titrate to 12 units toujeo and then patient to update me tomorrow morning. Will titrate by mychart until fastings 80-120 and see back in 2 weeks. Refer for diabetes education at this time- very poor dietary habits. Advised to inform me any sugar under 80 to give [place] some hypoglycemia cushion and educated on thsi topic as well. Continue metformin. bmet in 2 weeks and may increase metformin to 1g BID  Patient wants to come off insulin and we discussed would need significant weight loss   2 weeks. mychart communication on regular basis . Return precautions advised.   Orders Placed This Encounter  Procedures  . Ambulatory referral to diabetic education    Referral Priority:  Routine    Referral Type:  Consultation    Referral Reason:  Specialty Services Required    Number of Visits Requested:  1  . POC Glucose (CBG)   Garret Reddish, MD

## 2016-01-12 ENCOUNTER — Telehealth: Payer: Self-pay | Admitting: Family Medicine

## 2016-01-12 NOTE — Telephone Encounter (Signed)
Pt notified and will update [place] on Monday

## 2016-01-12 NOTE — Telephone Encounter (Signed)
Pt is calling to report bs this morning 273 and after taking metformin 237 . Pt BS has been ranging from 257 to 400. Pt is taking 12 unit insulin at bedtime. Pt BS  yesterday at lunch time was 392

## 2016-01-12 NOTE — Telephone Encounter (Signed)
Increase the insulin at bedtime to 15 units. Have him update me on Monday morning what sugars have bene over weekend.

## 2016-01-12 NOTE — Telephone Encounter (Signed)
See below

## 2016-01-16 ENCOUNTER — Telehealth: Payer: Self-pay | Admitting: Family Medicine

## 2016-01-16 NOTE — Telephone Encounter (Signed)
have him  Increase to 18 units (if he was taking 15 units as instructed previously- if not we need to discuss) and then update [place] on Friday morning

## 2016-01-16 NOTE — Telephone Encounter (Signed)
Pt called to report blood sugars: Am blood sugar today  212 Yesterday 228-226  Lunch  324 later last night  Sat  Night  185  Am 260 sat Pm 239 sun night Am Sunday 309

## 2016-01-16 NOTE — Telephone Encounter (Signed)
LVM to call back- I need an update on his blood sugars.

## 2016-01-16 NOTE — Telephone Encounter (Signed)
See below

## 2016-01-17 ENCOUNTER — Encounter: Payer: No Typology Code available for payment source | Attending: Family Medicine | Admitting: Nutrition

## 2016-01-17 VITALS — Wt 307.4 lb

## 2016-01-17 DIAGNOSIS — E1165 Type 2 diabetes mellitus with hyperglycemia: Secondary | ICD-10-CM | POA: Diagnosis not present

## 2016-01-17 DIAGNOSIS — Z794 Long term (current) use of insulin: Secondary | ICD-10-CM | POA: Insufficient documentation

## 2016-01-17 DIAGNOSIS — E119 Type 2 diabetes mellitus without complications: Secondary | ICD-10-CM

## 2016-01-17 NOTE — Progress Notes (Signed)
Patient is here today to review diet and to learn ways to bring his blood sugars down. Typical day: 9AM:  Bfast: 2eggs cooked in butter, 2 toast with butter and jelly, sometimes sausage, or 2 pieced of bacon.  Water to drink. 1:30:  Lunch:  Sandwich and small soup with chips and water to drink. 5:30 supper:  Meat is 6-7 ounces in the form of beef, chicken, pork, and is usually grilled--fried once a week.  1-2 veg. (usually one is starchy and about 30-40grams of carb)  Has cut down on bread with supper--usually none now. 8:30-9 snack.  3-4 sugar free cookies, or wheat pretzels.  Says is watching the sugar in his diet and " picking thinks that are the lowest in sugar"  Discussed the pathophysiology of diabetes, and the idea of insulin resisitance.  We discussed ways to decrease this resisitance and the need to do this.   He will start an exercise program of 30-40 min. Of walking a a fast pace 5 days/wk., and limiting all food groups in each meal.  He is not eating a large quantity of food, but is very high in fat--much mayo and butter on veg., and choosing high fat food choices, like double cheese burgers, chips, potato salad, etc.   Discussed ways to decrease fat in meals that will help him to loose weight--thus decreasing his insulin resistance.  His long term goal is 245 pounds--short term goal is 7 pounds in one month.   He will be limiting his carbs to no more than 60 per meal and 30 at bedtime.   He is using a One Touch Verio, and testing 5-6 times/day at random times.   He was told to test first morning and then to pick one meal and test ac and 2hr. pc one meal each day.  Goals: before meals: less than 120, and 2hr. Pc: less than 170.  He reported good understanding of this and had no final questions.

## 2016-01-17 NOTE — Patient Instructions (Signed)
Stop cold cereal and milk Walk for 30-45 min. 5 days/wk. Test blood sugars before breakfast, and then test before one meals and 2hr.  After meals one a day--alternating meal each day.

## 2016-01-17 NOTE — Telephone Encounter (Signed)
Pt states he has been taking 15units and will increase to 18units, he will update [place] Friday morning.

## 2016-01-19 ENCOUNTER — Telehealth: Payer: Self-pay | Admitting: Family Medicine

## 2016-01-19 NOTE — Telephone Encounter (Signed)
Pt would like to give blood sugar readings    5/12 AM 195         5/11 PM 226     Mid day   196      Mid afternoon 192     AM 189         5/10 PM 202  Mid day  182   Mid afternoon 202  AM 199

## 2016-01-19 NOTE — Telephone Encounter (Signed)
See below

## 2016-01-19 NOTE — Telephone Encounter (Signed)
Pt notified and will bring numbers in at his OV Tuesday.

## 2016-01-19 NOTE — Telephone Encounter (Signed)
Great! Increase to 21 units and update [place] on Tuesday or Wednesday of next week- he is improving. Alert [place] if any #s below 80

## 2016-01-23 ENCOUNTER — Ambulatory Visit (INDEPENDENT_AMBULATORY_CARE_PROVIDER_SITE_OTHER): Payer: PRIVATE HEALTH INSURANCE | Admitting: Family Medicine

## 2016-01-23 ENCOUNTER — Encounter: Payer: Self-pay | Admitting: Family Medicine

## 2016-01-23 VITALS — BP 114/80 | HR 80 | Temp 98.0°F | Ht 69.0 in | Wt 297.0 lb

## 2016-01-23 DIAGNOSIS — E1165 Type 2 diabetes mellitus with hyperglycemia: Secondary | ICD-10-CM

## 2016-01-23 DIAGNOSIS — Z794 Long term (current) use of insulin: Secondary | ICD-10-CM

## 2016-01-23 DIAGNOSIS — IMO0001 Reserved for inherently not codable concepts without codable children: Secondary | ICD-10-CM

## 2016-01-23 MED ORDER — GLIMEPIRIDE 4 MG PO TABS: 4.0000 mg | ORAL_TABLET | Freq: Every day | ORAL | Status: DC

## 2016-01-23 MED ORDER — METFORMIN HCL 1000 MG PO TABS: 1000.0000 mg | ORAL_TABLET | Freq: Two times a day (BID) | ORAL | Status: DC

## 2016-01-23 NOTE — Patient Instructions (Signed)
Increase Toujeo to 24 units Start amaryl/glimepiride 4mg  with breakfast Increase metformin to 1000mg  twice a day  Update me in 1 week  Look to see if you can get assistance with toujeo or if there are any insulins on your insurance or any assistance through insurance  Let me know immediately if any sugars under 80.   Hypoglycemia Hypoglycemia occurs when the glucose in your blood is too low. Glucose is a type of sugar that is your body's main energy source. Hormones, such as insulin and glucagon, control the level of glucose in the blood. Insulin lowers blood glucose and glucagon increases blood glucose. Having too much insulin in your blood stream, or not eating enough food containing sugar, can result in hypoglycemia. Hypoglycemia can happen to people with or without diabetes. It can develop quickly and can be a medical emergency.  CAUSES   Missing or delaying meals.  Not eating enough carbohydrates at meals.  Taking too much diabetes medicine.  Not timing your oral diabetes medicine or insulin doses with meals, snacks, and exercise.  Nausea and vomiting.  Certain medicines.  Severe illnesses, such as hepatitis, kidney disorders, and certain eating disorders.  Increased activity or exercise without eating something extra or adjusting medicines.  Drinking too much alcohol.  A nerve disorder that affects body functions like your heart rate, blood pressure, and digestion (autonomic neuropathy).  A condition where the stomach muscles do not function properly (gastroparesis). Therefore, medicines and food may not absorb properly.  Rarely, a tumor of the pancreas can produce too much insulin. SYMPTOMS   Hunger.  Sweating (diaphoresis).  Change in body temperature.  Shakiness.  Headache.  Anxiety.  Lightheadedness.  Irritability.  Difficulty concentrating.  Dry mouth.  Tingling or numbness in the hands or feet.  Restless sleep or sleep disturbances.  Altered  speech and coordination.  Change in mental status.  Seizures or prolonged convulsions.  Combativeness.  Drowsiness (lethargic).  Weakness.  Increased heart rate or palpitations.  Confusion.  Pale, gray skin color.  Blurred or double vision.  Fainting. DIAGNOSIS  A physical exam and medical history will be performed. Your caregiver may make a diagnosis based on your symptoms. Blood tests and other lab tests may be performed to confirm a diagnosis. Once the diagnosis is made, your caregiver will see if your signs and symptoms go away once your blood glucose is raised.  TREATMENT  Usually, you can easily treat your hypoglycemia when you notice symptoms.  Check your blood glucose. If it is less than 70 mg/dl, take one of the following:   3-4 glucose tablets.    cup juice.    cup regular soda.   1 cup skim milk.   -1 tube of glucose gel.   5-6 hard candies.   Avoid high-fat drinks or food that may delay a rise in blood glucose levels.  Do not take more than the recommended amount of sugary foods, drinks, gel, or tablets. Doing so will cause your blood glucose to go too high.   Wait 10-15 minutes and recheck your blood glucose. If it is still less than 70 mg/dl or below your target range, repeat treatment.   Eat a snack if it is more than 1 hour until your next meal.  There may be a time when your blood glucose may go so low that you are unable to treat yourself at home when you start to notice symptoms. You may need someone to help you. You may even faint or  be unable to swallow. If you cannot treat yourself, someone will need to bring you to the hospital.  [place]  If you have diabetes, follow your diabetes management plan by:  Taking your medicines as directed.  Following your exercise plan.  Following your meal plan. Do not skip meals. Eat on time.  Testing your blood glucose regularly. Check your blood glucose before and after  exercise. If you exercise longer or different than usual, be sure to check blood glucose more frequently.  Wearing your medical alert jewelry that says you have diabetes.  Identify the cause of your hypoglycemia. Then, develop ways to prevent the recurrence of hypoglycemia.  Do not take a hot bath or shower right after an insulin shot.  Always carry treatment with you. Glucose tablets are the easiest to carry.  If you are going to drink alcohol, drink it only with meals.  Tell friends or family members ways to keep you safe during a seizure. This may include removing hard or sharp objects from the area or turning you on your side.  Maintain a healthy weight. SEEK MEDICAL CARE IF:   You are having problems keeping your blood glucose in your target range.  You are having frequent episodes of hypoglycemia.  You feel you might be having side effects from your medicines.  You are not sure why your blood glucose is dropping so low.  You notice a change in vision or a new problem with your vision. SEEK IMMEDIATE MEDICAL CARE IF:   Confusion develops.  A change in mental status occurs.  The inability to swallow develops.  Fainting occurs.   This information is not intended to replace advice given to you by your health care provider. Make sure you discuss any questions you have with your health care provider.   Document Released: 08/26/2005 Document Revised: 08/31/2013 Document Reviewed: 05/02/2015 Elsevier Interactive Patient Education [employer].

## 2016-01-23 NOTE — Progress Notes (Signed)
Subjective:  Gabriel [name] is a 50 y.o. year old very pleasant male patient who presents for/with See problem oriented charting ROS- no hypoglycemia, no chest pain or shortness of breath, no nausea. .see any ROS included in HPI as well.   Past Medical History-  Patient Active Problem List   Diagnosis Date Noted  . Coronary Artery Disease 08/26/2013    Priority: High  . Diabetes mellitus type 2, uncontrolled ([place]) 08/10/2013    Priority: High  . 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 Morbid obesity ([place]) 08/10/2013    Priority: Medium  . HTN (hypertension) 02/06/2011    Priority: Medium  . Hypothyroidism 02/06/2011    Priority: Medium  . Hyperlipidemia 02/06/2011    Priority: Medium  . GERD (gastroesophageal reflux disease) 02/01/2015    Priority: Low  . Dysphagia 09/30/2013    Priority: Low  . Abnormal nuclear stress test 09/28/2015  . Abnormal stress test 09/22/2015  . Chest pain 08/04/2015  . Abdominal bloating 08/04/2015    Medications- reviewed and updated Current Outpatient Prescriptions  Medication Sig Dispense Refill  . acetaminophen (TYLENOL) 500 MG tablet Take 1,000 mg by mouth every 6 (six) hours as needed for mild pain or moderate pain. Reported on 01/09/2016    . aspirin EC 81 MG EC tablet Take 1 tablet (81 mg total) by mouth daily.    [name] glucose blood (ONE TOUCH TEST STRIPS) test strip Up to 4 times daily.  Onetouch Verio.  E11.65 100 each 12  . ibuprofen (ADVIL,MOTRIN) 200 MG tablet Take 400 mg by mouth every 6 (six) hours as needed for mild pain or moderate pain. Reported on 01/09/2016    . Insulin Glargine (TOUJEO SOLOSTAR) 300 UNIT/ML SOPN Inject 10 Units into the skin daily. Up to 30 units daily as directed by your doctor 5 pen 3  . Insulin Pen Needle 31G X 8 MM MISC 1 each by Does not apply route daily. 100 each 3  . Lancets (ONETOUCH ULTRASOFT) lancets Up to 4 times daily.  Onetouch Verio.  DxE11.65 100 each 12  . levothyroxine  (SYNTHROID, LEVOTHROID) 100 MCG tablet Take 1 tablet (100 mcg total) by mouth daily. 30 tablet 11  . losartan (COZAAR) 100 MG tablet Take 1 tablet (100 mg total) by mouth daily. 30 tablet 11  . metFORMIN (GLUCOPHAGE) 1000 MG tablet Take 1 tablet (1,000 mg total) by mouth 2 (two) times daily with a meal. 60 tablet 11  . Multiple Vitamins-Minerals (MULTIVITAMIN,TX-MINERALS) tablet Take 1 tablet by mouth daily.      . nitroGLYCERIN (NITROSTAT) 0.4 MG SL tablet Place 1 tablet (0.4 mg total) under the tongue every 5 (five) minutes as needed for chest pain (CP or SOB). 25 tablet 3  . pantoprazole (PROTONIX) 40 MG tablet Take 1 tablet (40 mg total) by mouth daily. 30 tablet 11  . simvastatin (ZOCOR) 40 MG tablet Take 1 tablet (40 mg total) by mouth at bedtime. 30 tablet 11  . glimepiride (AMARYL) 4 MG tablet Take 1 tablet (4 mg total) by mouth daily before breakfast. 30 tablet 3   No current facility-administered medications for this visit.    Objective: BP 114/80 mmHg  Pulse 80  Temp(Src) 98 F (36.7 C) (Oral)  Ht 5\' 9"  (1.753 m)  Wt 297 lb (134.718 kg)  BMI 43.84 kg/m2 Gen: NAD, resting comfortably CV: RRR no murmurs rubs or gallops Lungs: CTAB no crackles, wheeze, rhonchi Abdomen: soft/nontender/nondistended/normal bowel sounds. Obese.  Skin:  warm, dry  Assessment/Plan:  Diabetes mellitus type 2, uncontrolled (Gabriel [name]) S: improving control. On metformin 500mg  twice a day. Also on toujeo 21 units CBGs- 150s to 170s. Later in the day in 180-220 Exercise and diet- walking 2 miles a day and losing weight. Improved diet.  Did nutrition class and goes back again in another month Lab Results  Component Value Date   HGBA1C 10.1 01/08/2016   HGBA1C 6.7 10/24/2015   HGBA1C 7.0* 02/01/2015   A/P: increase to 24 units toujeo from 21, start amaryl 4mg , increase Metformin 1000mg  BID from 500mg  BID. Difficult situation as no medication coverage through insurance- he is going to try needymeds and  contacting toujeo to see if any programs as well as insurance to see if any potential help with insulin. If not- may convert to Novolin N (relion) through [facility] with BID dosing in vial form. also counseled patient on hypoglycemia signs and risks.   1 week by phone or mychart. 1 month by verbal instructions. Return precautions advised.   Meds ordered this encounter  Medications  . metFORMIN (GLUCOPHAGE) 1000 MG tablet    Sig: Take 1 tablet (1,000 mg total) by mouth 2 (two) times daily with a meal.    Dispense:  60 tablet    Refill:  11  . glimepiride (AMARYL) 4 MG tablet    Sig: Take 1 tablet (4 mg total) by mouth daily before breakfast.    Dispense:  30 tablet    Refill:  3   The duration of face-to-face time during this visit was 25 minutes. Greater than 50% of this time was spent in counseling, explanation of diagnosis, planning of further management, and/or coordination of care.   Garret Reddish, MD

## 2016-01-23 NOTE — Assessment & Plan Note (Signed)
S: improving control. On metformin 500mg  twice a day. Also on toujeo 21 units CBGs- 150s to 170s. Later in the day in 180-220 Exercise and diet- walking 2 miles a day and losing weight. Improved diet.  Did nutrition class and goes back again in another month Lab Results  Component Value Date   HGBA1C 10.1 01/08/2016   HGBA1C 6.7 10/24/2015   HGBA1C 7.0* 02/01/2015   A/P: increase to 24 units toujeo from 21, start amaryl 4mg , increase Metformin 1000mg  BID from 500mg  BID. Difficult situation as no medication coverage through insurance- he is going to try needymeds and contacting toujeo to see if any programs as well as insurance to see if any potential help with insulin. If not- may convert to Novolin N (relion) through [facility] with BID dosing in vial form.

## 2016-01-26 ENCOUNTER — Telehealth: Payer: Self-pay | Admitting: Family Medicine

## 2016-01-26 NOTE — Telephone Encounter (Signed)
Sugar levels look much improved. I would continue current doses. He mentions blood sugar readings going down but I do not see any lows (is he getting anything below 80 or 70?). If not- continue current doses

## 2016-01-26 NOTE — Telephone Encounter (Signed)
I called the pt and informed him of the message below.  He denies any readings of 70-80 and was advised to continue the current doses.

## 2016-01-26 NOTE — Telephone Encounter (Signed)
Pt wanted to give Blood sugars 5/19 130, 5/18 181 5/19 1:38am 155, 5/18 am fasting 182 after breakfast 169  Before lunch159 after lunch 179 dinner 158 after dinner 124.  10 units after dinner reading 9:30pm  and insulin inj 10:00pm.  Pt would like to know what he should be taking after you know his reading. After taking the glimepiride 4 mg in a.m. blood sugar readings goes down.  Pt would like to have a call back today.

## 2016-01-31 ENCOUNTER — Telehealth: Payer: Self-pay | Admitting: Family Medicine

## 2016-01-31 NOTE — Telephone Encounter (Signed)
Overall looks pretty reasonable- would continue current dose of medicine  Can he update me on this  "difficult situation as no medication coverage through insurance- he is going to try needymeds and contacting toujeo to see if any programs as well as insurance to see if any potential help with insulin. If not- may convert to Novolin N (relion) through [facility] with BID dosing in vial form."  Would advise him to schedule 3 week follow up in person as long as no #s below 70 or 80- otherwise call me

## 2016-01-31 NOTE — Telephone Encounter (Signed)
Pt called with the following numbers    5//24/17 10;00 am 142 1;23pm 122  01/30/16  121 morning 142 lunch before dinner 84 drank orange juice went up to 137   After  dinner 153 Night time 186

## 2016-02-01 NOTE — Telephone Encounter (Signed)
I spoke with patient and advised him of the message from Dr Yong Channel. I made appt for 06/15. Pt states he is waiting to hear back from the "drug company" in regards to insulin assistance.

## 2016-02-01 NOTE — Telephone Encounter (Signed)
Left message for patient to call back  

## 2016-02-04 ENCOUNTER — Other Ambulatory Visit: Payer: Self-pay | Admitting: Family Medicine

## 2016-02-06 ENCOUNTER — Other Ambulatory Visit: Payer: Self-pay | Admitting: *Deleted

## 2016-02-06 MED ORDER — LEVOTHYROXINE SODIUM 100 MCG PO TABS: 100.0000 ug | ORAL_TABLET | Freq: Every day | ORAL | Status: DC

## 2016-02-06 MED ORDER — LOSARTAN POTASSIUM 100 MG PO TABS: 100.0000 mg | ORAL_TABLET | Freq: Every day | ORAL | Status: DC

## 2016-02-06 NOTE — Telephone Encounter (Signed)
Rx done. 

## 2016-02-19 ENCOUNTER — Encounter: Payer: Self-pay | Admitting: Nutrition

## 2016-02-19 ENCOUNTER — Encounter: Payer: No Typology Code available for payment source | Attending: Family Medicine | Admitting: Nutrition

## 2016-02-19 VITALS — Wt 306.2 lb

## 2016-02-19 DIAGNOSIS — E1165 Type 2 diabetes mellitus with hyperglycemia: Secondary | ICD-10-CM | POA: Diagnosis not present

## 2016-02-19 DIAGNOSIS — Z794 Long term (current) use of insulin: Secondary | ICD-10-CM | POA: Diagnosis not present

## 2016-02-19 NOTE — Progress Notes (Signed)
Patient reports that he has been going to "a lot of graduation parties, and eating out more"--where he is eating cheese burgers and potato salad, chips and some deserts-like cake. Discussed the importance of making lower fat choices for his weight--like not cheese on hamburgers, limiting the amounts of higher fat choices like fries, fried chicken sandwiches, fries, and regular salad dressings, and switching to grilled sandwiches, limiting fries to 1/2 sizes of small orders, and no cheese/mayo on sandwiches, and using light dressings on salad.   We reviewed all of his meals and suggestions were given on how he can reduce the fat (and 100-- 200 calories ) with each meal eaten.   He reported good understanding of this.  He continues to exercise 4X/wk for 45 min. To 1 hours.  Strongly encouraged he continue with this.  He did not bring his meter.  Said FBS today was 95.  Said his 2 hour blood sugar reading are usually less than 160, and his FBSs range now from 90-115.  He decreased his lantus to 10u due to cost, and also that he was dropping down into the 70s in the AM.  He reported also dropping his metformin to 500mg .

## 2016-02-20 NOTE — Patient Instructions (Signed)
Eat baked instead of fried foods Take cheese off cheeseburgers Limit fried foods to 2X/wk Continue exercise for 40 min. 4-5 days/wk. Return 2 months Call if questions

## 2016-02-22 ENCOUNTER — Ambulatory Visit (INDEPENDENT_AMBULATORY_CARE_PROVIDER_SITE_OTHER): Payer: PRIVATE HEALTH INSURANCE | Admitting: Family Medicine

## 2016-02-22 ENCOUNTER — Encounter: Payer: Self-pay | Admitting: Family Medicine

## 2016-02-22 VITALS — BP 128/72 | HR 88 | Temp 97.8°F | Ht 69.0 in | Wt 305.0 lb

## 2016-02-22 DIAGNOSIS — E1165 Type 2 diabetes mellitus with hyperglycemia: Secondary | ICD-10-CM | POA: Diagnosis not present

## 2016-02-22 DIAGNOSIS — Z794 Long term (current) use of insulin: Secondary | ICD-10-CM

## 2016-02-22 DIAGNOSIS — M7712 Lateral epicondylitis, left elbow: Secondary | ICD-10-CM

## 2016-02-22 DIAGNOSIS — IMO0001 Reserved for inherently not codable concepts without codable children: Secondary | ICD-10-CM

## 2016-02-22 NOTE — Progress Notes (Addendum)
Subjective:  Gabriel [name] is a 50 y.o. year old very pleasant male patient who presents for/with See problem oriented charting ROS- no SOB. No headaches or blurry vision. No hypoglycemia.Gabriel [name] any ROS included in HPI as well.   Past Medical History-  Patient Active Problem List   Diagnosis Date Noted  . Coronary Artery Disease 08/26/2013    Priority: High  . Diabetes mellitus type 2, uncontrolled ([place]) 08/10/2013    Priority: High  . 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 Morbid obesity ([place]) 08/10/2013    Priority: Medium  . HTN (hypertension) 02/06/2011    Priority: Medium  . Hypothyroidism 02/06/2011    Priority: Medium  . Hyperlipidemia 02/06/2011    Priority: Medium  . GERD (gastroesophageal reflux disease) 02/01/2015    Priority: Low  . Dysphagia 09/30/2013    Priority: Low  . Abnormal nuclear stress test 09/28/2015  . Abnormal stress test 09/22/2015  . Chest pain 08/04/2015  . Abdominal bloating 08/04/2015    Medications- reviewed and updated Current Outpatient Prescriptions  Medication Sig Dispense Refill  . acetaminophen (TYLENOL) 500 MG tablet Take 1,000 mg by mouth every 6 (six) hours as needed for mild pain or moderate pain. Reported on 01/09/2016    . aspirin EC 81 MG EC tablet Take 1 tablet (81 mg total) by mouth daily.    Gabriel Kitchen glimepiride (AMARYL) 4 MG tablet Take 1 tablet (4 mg total) by mouth daily before breakfast. 30 tablet 3  . glucose blood (ONE TOUCH TEST STRIPS) test strip Up to 4 times daily.  Onetouch Verio.  E11.65 100 each 12  . ibuprofen (ADVIL,MOTRIN) 200 MG tablet Take 400 mg by mouth every 6 (six) hours as needed for mild pain or moderate pain. Reported on 01/09/2016    . Insulin Glargine (TOUJEO SOLOSTAR) 300 UNIT/ML SOPN Inject 10 Units into the skin daily. Up to 30 units daily as directed by your doctor 5 pen 3  . Insulin Pen Needle 31G X 8 MM MISC 1 each by Does not apply route daily. 100 each 3  . Lancets (ONETOUCH  ULTRASOFT) lancets Up to 4 times daily.  Onetouch Verio.  DxE11.65 100 each 12  . levothyroxine (SYNTHROID, LEVOTHROID) 100 MCG tablet Take 1 tablet (100 mcg total) by mouth daily. 30 tablet 5  . losartan (COZAAR) 100 MG tablet Take 1 tablet (100 mg total) by mouth daily. 30 tablet 5  . metFORMIN (GLUCOPHAGE) 1000 MG tablet Take 1 tablet (1,000 mg total) by mouth 2 (two) times daily with a meal. 60 tablet 11  . Multiple Vitamins-Minerals (MULTIVITAMIN,TX-MINERALS) tablet Take 1 tablet by mouth daily.      . nitroGLYCERIN (NITROSTAT) 0.4 MG SL tablet Place 1 tablet (0.4 mg total) under the tongue every 5 (five) minutes as needed for chest pain (CP or SOB). 25 tablet 3  . pantoprazole (PROTONIX) 40 MG tablet Take 1 tablet (40 mg total) by mouth daily. 30 tablet 11  . simvastatin (ZOCOR) 40 MG tablet Take 1 tablet (40 mg total) by mouth at bedtime. 30 tablet 11   No current facility-administered medications for this visit.    Objective: BP 128/72 mmHg  Pulse 88  Temp(Src) 97.8 F (36.6 C) (Oral)  Ht 5\' 9"  (1.753 m)  Wt 305 lb (138.347 kg)  BMI 45.02 kg/m2  SpO2 95% Gen: NAD, resting comfortably CV: RRR no murmurs rubs or gallops Lungs: CTAB no crackles, wheeze, rhonchi Abdomen: soft/nontender/nondistended/normal bowel sounds. obese Ext: no  edema Msk: see below Skin: warm, dry Neuro: grossly normal, moves all extremities  Assessment/Plan:  Tennis elbow S: issues for 5-6 weeks. Band didn't fit on arm, refer to sports medicine. Left arm- moderate pain worse with turning door knobs. Aspirin helps some O: pain on lateral epicondylitis of left elbow, worse with pronation A/P: refer to sports medicine as cannot fit counterforce band to help heal this. nsaids not ideal with CAD. May need injection     Diabetes mellitus type 2, uncontrolled ([place]) S: improved control. Plan from last visit- On toujeo 24 (up from 21), start of amaryl 4mg  last visit, increse last visit to 1g BID metformin. No  med coverage for patient.   Per patient- 500mg  metformin twice a day patient reduced and dropped insulin down to 10 units. Stopped taking amaryl.   CBGs- this morning was 117. Usually around 115-120 in the morning. During the day may get lower, still nothing below 70.  Exercise and diet- more fruits/veggies. Avoiding breads/potatoes. Getting salad most days-watching dressing. Walking 2.5-3 miles a day.   Lab Results  Component Value Date   HGBA1C 10.1 01/08/2016   HGBA1C 6.7 10/24/2015   HGBA1C 7.0* 02/01/2015   A/P: continue toujeo 10 units, metformin 1000mg  twice a day (increase from 500 he decreased to), continue off amaryl. No insurance coverage. We may have to do NPH or perhaps he can come off insulin if drastically improves diet and loses weight  1 month toujeo given- already has 1 other 300 unit pen so should last 2 months  Return in about 2 months (around 04/23/2016).  Orders Placed This Encounter  Procedures  . Ambulatory referral to Sports Medicine    Referral Priority:  Routine    Referral Type:  Consultation    Number of Visits Requested:  1   The duration of face-to-face time during this visit was 25 minutes. Greater than 50% of this time was spent in counseling, explanation of diagnosis, planning of further management, and/or coordination of care.    Return precautions advised.  Garret Reddish, MD

## 2016-02-22 NOTE — Addendum Note (Signed)
Addended by: Marin Olp on: 02/22/2016 11:28 AM   Modules accepted: Level of Service

## 2016-02-22 NOTE — Assessment & Plan Note (Signed)
S: improved control. Plan from last visit- On toujeo 24 (up from 21), start of amaryl 4mg  last visit, increse last visit to 1g BID metformin. No med coverage for patient.   Per patient- 500mg  metformin twice a day patient reduced and dropped insulin down to 10 units. Stopped taking amaryl.   CBGs- this morning was 117. Usually around 115-120 in the morning. During the day may get lower, still nothing below 70.  Exercise and diet- more fruits/veggies. Avoiding breads/potatoes. Getting salad most days-watching dressing. Walking 2.5-3 miles a day.   Lab Results  Component Value Date   HGBA1C 10.1 01/08/2016   HGBA1C 6.7 10/24/2015   HGBA1C 7.0* 02/01/2015   A/P: continue toujeo 10 units, metformin 1000mg  twice a day (increase from 500 he decreased to), continue off amaryl. No insurance coverage. We may have to do NPH or perhaps he can come off insulin if drastically improves diet and loses weight

## 2016-02-22 NOTE — Patient Instructions (Addendum)
COntinue toujeo at 10 units (give 1 sample box again)  Start metformin back 1000mg  twice a day  Update me 1 month  See me in 2 months  We will call you within a week about your referral to sports medicine. If you do not hear within 2 weeks, give [place] a call.

## 2016-02-22 NOTE — Progress Notes (Signed)
Pre visit review using our clinic review tool, if applicable. No additional management support is needed unless otherwise documented below in the visit note. 

## 2016-03-01 ENCOUNTER — Other Ambulatory Visit: Payer: Self-pay | Admitting: Family Medicine

## 2016-03-05 ENCOUNTER — Other Ambulatory Visit: Payer: Self-pay | Admitting: Family Medicine

## 2016-03-18 ENCOUNTER — Ambulatory Visit (INDEPENDENT_AMBULATORY_CARE_PROVIDER_SITE_OTHER): Payer: PRIVATE HEALTH INSURANCE | Admitting: Family Medicine

## 2016-03-18 ENCOUNTER — Encounter: Payer: Self-pay | Admitting: Family Medicine

## 2016-03-18 VITALS — BP 118/80 | HR 90 | Ht 69.0 in | Wt 308.0 lb

## 2016-03-18 DIAGNOSIS — M7712 Lateral epicondylitis, left elbow: Secondary | ICD-10-CM | POA: Diagnosis not present

## 2016-03-18 MED ORDER — DICLOFENAC SODIUM 2 % TD SOLN: 2.0000 "application " | Freq: Two times a day (BID) | TRANSDERMAL | Status: DC

## 2016-03-18 NOTE — Progress Notes (Signed)
Pre visit review using our clinic review tool, if applicable. No additional management support is needed unless otherwise documented below in the visit note. 

## 2016-03-18 NOTE — Progress Notes (Signed)
[name] [address], [place] [address] Phone: [phone] Subjective:    I'm seeing this patient by the request  of:  Gabriel Reddish, MD   CC: left elbow pain  [id number] Gabriel [name] is a 50 y.o. male coming in with complaint of left elbow pain. Patient states his been going on multiple months. Patient was given an oral anti-inflammatory but states that it's been somewhat helpful but does not make the pain go completely away. Significant stiffness in the morning. States that even with light lifting he can have severe pain. Patient denies any radiation of pain. Denies any numbness. Rates the severity of pain though is 6 out of 10. Does not stop him from daily activities but notices he uses his right hand significantly more.     Past Medical History  Diagnosis Date  . Hypertension     a. Dx 3y ago.  Gabriel [name] Hyperlipidemia     a. Dx 3y ago - not on statin.  Gabriel [name] GERD (gastroesophageal reflux disease)   . Hypothyroidism     a. on replacement.  . Obesity   . Snoring   . Chest pain     a. 08/2013 Cardia CTA: Ca score 238 (95%), LM nl, LAD mod stenosis in D2 area, D1 mild ost stenosis, D2 no signif dzs, LCX small, RCA nl.  . Type II diabetes mellitus ([place])   . Arthritis     "joints; from where I've had surgeries" (08/17/2013)  . Coronary artery disease     LHC (08/18/13):  pLAD 50, CFX and RCA normal.  EF 55-65%.  LAD lesion FFR:  0.88 (normal).  Med rx recommended.    . Sleep apnea   . Dysphagia     Upper endoscopy 11/2013 - without obvious stricture s/p Maloney dilation.    Past Surgical History  Procedure Laterality Date  . Shoulder arthroscopy w/ rotator cuff repair Left   . Knee arthroscopy Right X 2  . Ankle surgery Right     "had a growth in it; cut it out" (08/17/2013)  . Cholecystectomy    . Left heart catheterization with coronary angiogram N/A 08/18/2013    Procedure: LEFT HEART CATHETERIZATION WITH CORONARY ANGIOGRAM;   Surgeon: Gabriel M Martinique, MD;  Location: [facility] CATH LAB;  Service: Cardiovascular;  Laterality: N/A;  . Cardiac catheterization N/A 09/28/2015    Procedure: Left Heart Cath and Coronary Angiography;  Surgeon: Gabriel M Martinique, MD;  Location: [facility] CV LAB;  Service: Cardiovascular;  Laterality: N/A;   Social History   Social History  . Marital Status: Single    Spouse Name: N/A  . Number of Children: 0  . Years of Education: N/A   Occupational History  . Unemployed    Social History Main Topics  . Smoking status: Former Smoker -- 1.50 packs/day for 24 years    Types: Cigarettes    Quit date: 09/10/2007  . Smokeless tobacco: Never Used  . Alcohol Use: No  . Drug Use: No  . Sexual Activity: Yes   Other Topics Concern  . None   Social History Narrative   Lives in [place] with parents (cares for parents).  Never married. Unemployed.     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[employer] (stops intermittently while caring for parents)   No Known Allergies Family History  Problem Relation Age of Onset  . Adopted: Yes  . Coronary artery disease Father     s/p MI in his early  30's->CABG x 5 @ age 34, currently 73.  . Diabetes Father   . Stroke Father   . Hypertension Father   . Hypertension Mother   . Obesity Mother   . Diabetes Mother   . Diabetes Brother   . Obesity Brother   . Asthma Maternal Grandfather     Past medical history, social, surgical and family history all reviewed in electronic medical record.  No pertanent information unless stated regarding to the chief complaint.   Review of Systems: No headache, visual changes, nausea, vomiting, diarrhea, constipation, dizziness, abdominal pain, skin rash, fevers, chills, night sweats, weight loss, swollen lymph nodes, body aches, joint swelling, muscle aches, chest pain, shortness of breath, mood changes.   Objective Blood pressure 118/80, pulse 90, height 5\' 9"  (1.753 m), weight 308 lb (139.708 kg), SpO2 97 %.  General: No  apparent distress alert and oriented x3 mood and affect normal, dressed appropriately.  HEENT: Pupils equal, extraocular movements intact  Respiratory: Patient's speak in full sentences and does not appear short of breath  Cardiovascular: No lower extremity edema, non tender, no erythema  Skin: Warm dry intact with no signs of infection or rash on extremities or on axial skeleton.  Abdomen: Soft nontender  Neuro: Cranial nerves II through XII are intact, neurovascularly intact in all extremities with 2+ DTRs and 2+ pulses.  Lymph: No lymphadenopathy of posterior or anterior cervical chain or axillae bilaterally.  Gait normal with good balance and coordination.  MSK:  Non tender with full range of motion and good stability and symmetric strength and tone of shoulders,  wrist, hip, knee and ankles bilaterally.  Elbow:left  Unremarkable to inspection. Range of motion full pronation, supination, flexion, extension. Strength is full to all of the above directions Stable to varus, valgus stress. Negative moving valgus stress test. Tender to palpation over the lateral epicondylar region and tender in the same area with wrist extension against resistance. Ulnar nerve does not sublux. Negative cubital tunnel Tinel's. Contralateral elbow unremarkable  Procedure note.  [id number]; 15 minutes spent for Therapeutic exercises as stated in above notes.  This included exercises focusing on stretching, strengthening, with significant focus on eccentric aspects.  Lateral Epicondylitis: Elbow anatomy was reviewed, and tendinopathy was explained.  Pt. given a formal rehab program. Series of concentric and eccentric exercises should be done starting with no weight, work up to 1 lb, hammer, etc.  Use counterforce strap if working or using hands.  Formal PT would be beneficial. Emphasized stretching an cross-friction massage Emphasized proper palms up lifting biomechanics to unload ECRB  Proper technique shown  and discussed handout in great detail with ATC.  All questions were discussed and answered.      Impression and Recommendations:     This case required medical decision making of moderate complexity.      Note: This dictation was prepared with Dragon dictation along with smaller phrase technology. Any transcriptional errors that result from this process are unintentional.

## 2016-03-18 NOTE — Patient Instructions (Signed)
Good to see you.  Ice 20 minutes 2 times daily. Usually after activity and before bed. Exercises 3 times a week.  pennsaid pinkie amount topically 2 times daily as needed.  Wear brace day and night for 1 week then nightly for 2 weeks.  Only lift things underhand or thumbs up.  No heavy lifting  See me again in 4 weeks.

## 2016-03-18 NOTE — Assessment & Plan Note (Signed)
Patient learn home exercises from my trainer today. Prescribed anti-inflammatories topically. Patient was given a wrist brace to avoid excessive wrist extension. We discussed lifting mechanics. Follow-up again in 3-4 weeks. At that time continuing have pain we'll consider ultrasound for further evaluation as well as possible referral to formal physical therapy.

## 2016-04-13 NOTE — Progress Notes (Signed)
[name] [address] [place], [place] [address] Phone: [phone] Subjective:    I'm seeing this patient by the request  of:  Garret Reddish, MD   CC: left elbow pain f/u  [id number]  Gabriel [name] is a 50 y.o. male coming in with complaint of left elbow pain. Patient states his been going on multiple months. Patient Was found to have more of a lateral epicondylitis. Patient given a brace, home exercises, topical anti-inflammatories and icing regimen. Patient states  He is making progress. Patient states when he is about 20-30% better. Still has some mild discomfort. Doing the exercises occasionally. Was unable to afford the topical anti-inflammatories. Patient has been taking any other medications. Patient continues to be significantly active and tries to remember proper lifting mechanics but does not do this all the time. Does wear the brace mostly at night.    Past Medical History:  Diagnosis Date  . Arthritis    "joints; from where I've had surgeries" (08/17/2013)  . Chest pain    a. 08/2013 Cardia CTA: Ca score 238 (95%), LM nl, LAD mod stenosis in D2 area, D1 mild ost stenosis, D2 no signif dzs, LCX small, RCA nl.  . Coronary artery disease    LHC (08/18/13):  pLAD 50, CFX and RCA normal.  EF 55-65%.  LAD lesion FFR:  0.88 (normal).  Med rx recommended.    [name] Dysphagia    Upper endoscopy 11/2013 - without obvious stricture s/p Maloney dilation.   [name] GERD (gastroesophageal reflux disease)   . Hyperlipidemia    a. Dx 3y ago - not on statin.  [name] Hypertension    a. Dx 3y ago.  [name] Hypothyroidism    a. on replacement.  . Obesity   . Sleep apnea   . Snoring   . Type II diabetes mellitus ([place])    Past Surgical History:  Procedure Laterality Date  . ANKLE SURGERY Right    "had a growth in it; cut it out" (08/17/2013)  . CARDIAC CATHETERIZATION N/A 09/28/2015   Procedure: Left Heart Cath and Coronary Angiography;  Surgeon: Peter M Martinique, MD;   Location: [facility] CV LAB;  Service: Cardiovascular;  Laterality: N/A;  . CHOLECYSTECTOMY    . KNEE ARTHROSCOPY Right X 2  . LEFT HEART CATHETERIZATION WITH CORONARY ANGIOGRAM N/A 08/18/2013   Procedure: LEFT HEART CATHETERIZATION WITH CORONARY ANGIOGRAM;  Surgeon: Peter M Martinique, MD;  Location: [facility] CATH LAB;  Service: Cardiovascular;  Laterality: N/A;  . SHOULDER ARTHROSCOPY W/ ROTATOR CUFF REPAIR Left    Social History   Social History  . Marital status: Single    Spouse name: N/A  . Number of children: 0  . Years of education: N/A   Occupational History  . Unemployed    Social History Main Topics  . Smoking status: Former Smoker    Packs/day: 1.50    Years: 24.00    Types: Cigarettes    Quit date: 09/10/2007  . Smokeless tobacco: Never Used  . Alcohol use No  . Drug use: No  . Sexual activity: Yes   Other Topics Concern  . None   Social History Narrative   Lives in [place] with parents (cares for parents).  Never married. Unemployed.     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[employer] (stops intermittently while caring for parents)   No Known Allergies Family History  Problem Relation Age of Onset  . Adopted: Yes  . Coronary artery disease Father  s/p MI in his early 30's->CABG x 5 @ age 56, currently 21.  . Diabetes Father   . Stroke Father   . Hypertension Father   . Hypertension Mother   . Obesity Mother   . Diabetes Mother   . Diabetes Brother   . Obesity Brother   . Asthma Maternal Grandfather     Past medical history, social, surgical and family history all reviewed in electronic medical record.  No pertanent information unless stated regarding to the chief complaint.   Review of Systems: No headache, visual changes, nausea, vomiting, diarrhea, constipation, dizziness, abdominal pain, skin rash, fevers, chills, night sweats, weight loss, swollen lymph nodes, body aches, joint swelling, muscle aches, chest pain, shortness of breath, mood changes.   Objective   Blood pressure 112/78, pulse 73, weight (!) 303 lb (137.4 kg), SpO2 98 %.  General: No apparent distress alert and oriented x3 mood and affect normal, dressed appropriately.  HEENT: Pupils equal, extraocular movements intact  Respiratory: Patient's speak in full sentences and does not appear short of breath  Cardiovascular: No lower extremity edema, non tender, no erythema  Skin: Warm dry intact with no signs of infection or rash on extremities or on axial skeleton.  Abdomen: Soft nontender  Neuro: Cranial nerves II through XII are intact, neurovascularly intact in all extremities with 2+ DTRs and 2+ pulses.  Lymph: No lymphadenopathy of posterior or anterior cervical chain or axillae bilaterally.  Gait normal with good balance and coordination.  MSK:  Non tender with full range of motion and good stability and symmetric strength and tone of shoulders,  wrist, hip, knee and ankles bilaterally.  Elbow:left  Unremarkable to inspection. Range of motion full pronation, supination, flexion, extension. Strength is full to all of the above directions Stable to varus, valgus stress. Negative moving valgus stress test. Continued tenderness over the lateral epicondylar region. Pain with resisted wrist extension Ulnar nerve does not sublux. Negative cubital tunnel Tinel's. Contralateral elbow unremarkable  minimal change from previous exam.    Impression and Recommendations:     This case required medical decision making of moderate complexity.      Note: This dictation was prepared with Dragon dictation along with smaller phrase technology. Any transcriptional errors that result from this process are unintentional.

## 2016-04-15 ENCOUNTER — Encounter: Payer: Self-pay | Admitting: Family Medicine

## 2016-04-15 ENCOUNTER — Ambulatory Visit (INDEPENDENT_AMBULATORY_CARE_PROVIDER_SITE_OTHER): Payer: PRIVATE HEALTH INSURANCE | Admitting: Family Medicine

## 2016-04-15 DIAGNOSIS — M7712 Lateral epicondylitis, left elbow: Secondary | ICD-10-CM | POA: Diagnosis not present

## 2016-04-15 NOTE — Patient Instructions (Signed)
Good to see you  Ice is your friend especially after activity  Lift with palms up or underhand only  Continue the brace at night .zpens See me again in 6-7 weeks and if not perfect we will don injection

## 2016-04-15 NOTE — Assessment & Plan Note (Signed)
Discussed with patient at great length. We discussed the possibility of an injection but we will hold for now. Unable to do notch glycerin secondary to patient's history of coronary artery disease. We discussed formal physical therapy but secondary to financial constraints patient is unable to do this. Patient will continue with the conservative therapy, given trial of topical anti-inflammatories, we discussed icing regimen. Discussed proper lifting mechanics and how important this is. Encourage him to continue doing bracing at night. Follow-up again in 6-8 weeks. At that time if worsening symptoms I would like to do injection. All patient's questions were answered. Establish billingSpent  25 minutes with patient face-to-face and had greater than 50% of counseling including as described above in assessment and plan.

## 2016-04-25 ENCOUNTER — Encounter: Payer: Self-pay | Admitting: Family Medicine

## 2016-04-25 ENCOUNTER — Ambulatory Visit (INDEPENDENT_AMBULATORY_CARE_PROVIDER_SITE_OTHER): Payer: PRIVATE HEALTH INSURANCE | Admitting: Family Medicine

## 2016-04-25 VITALS — BP 116/80 | HR 70 | Temp 98.6°F | Wt 308.0 lb

## 2016-04-25 DIAGNOSIS — I10 Essential (primary) hypertension: Secondary | ICD-10-CM | POA: Diagnosis not present

## 2016-04-25 DIAGNOSIS — IMO0001 Reserved for inherently not codable concepts without codable children: Secondary | ICD-10-CM

## 2016-04-25 DIAGNOSIS — E1165 Type 2 diabetes mellitus with hyperglycemia: Secondary | ICD-10-CM

## 2016-04-25 DIAGNOSIS — Z794 Long term (current) use of insulin: Secondary | ICD-10-CM

## 2016-04-25 LAB — POCT GLYCOSYLATED HEMOGLOBIN (HGB A1C): HEMOGLOBIN A1C: 6

## 2016-04-25 NOTE — Patient Instructions (Signed)
No changes in regimen  Continue metformin 1000mg  BID Continue amaryl 4mg  each morning  Continue toujeo 10 units if sugar above 120 in evenings

## 2016-04-25 NOTE — Assessment & Plan Note (Signed)
S: improving controlled. Had been titrated on toujeo up to 24 units, amaryl 4mg , metformin 1000mg  twice a day. Patient states sugars have drastically improved- he has started taking the toujeo on an as needed basis. If he is 120 or under he does not take the toujeo. Usually takes 10 units if above 120. Has not had any lows on that regimen. This morning was 128 but has not remembered to check many other times of the day.  Exercise and diet- weight has crept up and states lots of birthdays last week and was eating more sweets. Missed exercise last week. 3 birthdays in family and anniversary for parents last week.  Lab Results  Component Value Date   HGBA1C 6.0 04/25/2016   HGBA1C 10.1 01/08/2016   HGBA1C 6.7 10/24/2015   A/P: drastic improvement. Continue regimen max metformin, amaryl 4mg , toujeo 10 units if CBG >120 in evening

## 2016-04-25 NOTE — Progress Notes (Signed)
Pre visit review using our clinic review tool, if applicable. No additional management support is needed unless otherwise documented below in the visit note. 

## 2016-04-25 NOTE — Progress Notes (Signed)
Subjective:  Gabriel [name] is a 50 y.o. year old very pleasant male patient who presents for/with See problem oriented charting ROS- no hypoglycemia. No chest pain or shortness of breath. No headache or blurry vision .see any ROS included in HPI as well.   Past Medical History-  Patient Active Problem List   Diagnosis Date Noted  . Coronary Artery Disease 08/26/2013    Priority: High  . Diabetes mellitus type 2, uncontrolled ([place]) 08/10/2013    Priority: High  . 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 Morbid obesity ([place]) 08/10/2013    Priority: Medium  . HTN (hypertension) 02/06/2011    Priority: Medium  . Hypothyroidism 02/06/2011    Priority: Medium  . Hyperlipidemia 02/06/2011    Priority: Medium  . GERD (gastroesophageal reflux disease) 02/01/2015    Priority: Low  . Dysphagia 09/30/2013    Priority: Low  . Left lateral epicondylitis 03/18/2016  . Abnormal nuclear stress test 09/28/2015    Medications- reviewed and updated Current Outpatient Prescriptions  Medication Sig Dispense Refill  . acetaminophen (TYLENOL) 500 MG tablet Take 1,000 mg by mouth every 6 (six) hours as needed for mild pain or moderate pain. Reported on 01/09/2016    . aspirin EC 81 MG EC tablet Take 1 tablet (81 mg total) by mouth daily.    . Diclofenac Sodium (PENNSAID) 2 % SOLN Place 2 application onto the skin 2 (two) times daily. 112 g 3  . glucose blood (ONE TOUCH TEST STRIPS) test strip Up to 4 times daily.  Onetouch Verio.  E11.65 100 each 12  . ibuprofen (ADVIL,MOTRIN) 200 MG tablet Take 400 mg by mouth every 6 (six) hours as needed for mild pain or moderate pain. Reported on 01/09/2016    . Insulin Glargine (TOUJEO SOLOSTAR) 300 UNIT/ML SOPN Inject 10 Units into the skin daily. Up to 30 units daily as directed by your doctor 5 pen 3  . Insulin Pen Needle 31G X 8 MM MISC 1 each by Does not apply route daily. 100 each 3  . Lancets (ONETOUCH ULTRASOFT) lancets Up to 4 times  daily.  Onetouch Verio.  DxE11.65 100 each 12  . levothyroxine (SYNTHROID, LEVOTHROID) 100 MCG tablet Take 1 tablet (100 mcg total) by mouth daily. 30 tablet 5  . losartan (COZAAR) 100 MG tablet Take 1 tablet (100 mg total) by mouth daily. 30 tablet 5  . metFORMIN (GLUCOPHAGE) 1000 MG tablet Take 1 tablet (1,000 mg total) by mouth 2 (two) times daily with a meal. 60 tablet 11  . Multiple Vitamins-Minerals (MULTIVITAMIN,TX-MINERALS) tablet Take 1 tablet by mouth daily.      . nitroGLYCERIN (NITROSTAT) 0.4 MG SL tablet Place 1 tablet (0.4 mg total) under the tongue every 5 (five) minutes as needed for chest pain (CP or SOB). 25 tablet 3  . pantoprazole (PROTONIX) 40 MG tablet TAKE ONE TABLET BY MOUTH ONCE DAILY 30 tablet 1  . simvastatin (ZOCOR) 40 MG tablet TAKE ONE TABLET BY MOUTH AT BEDTIME 30 tablet 5   Objective: BP 116/80   Pulse 70   Temp 98.6 F (37 C)   Wt (!) 308 lb (139.7 kg)   BMI 45.48 kg/m  Gen: NAD, resting comfortably CV: RRR no murmurs rubs or gallops Lungs: CTAB no crackles, wheeze, rhonchi Abdomen: soft/nontender/nondistended/normal bowel sounds. Morbid obesity Ext: no edema Skin: warm, dry Neuro: grossly normal, moves all extremities  Diabetic Foot Exam - Simple   Simple Foot Form Diabetic Foot  exam was performed with the following findings:  Yes 04/25/2016 11:28 AM  Visual Inspection No deformities, no ulcerations, no other skin breakdown bilaterally:  Yes Sensation Testing Intact to touch and monofilament testing bilaterally:  Yes Pulse Check Posterior Tibialis and Dorsalis pulse intact bilaterally:  Yes Comments    Assessment/Plan:  Diabetes mellitus type 2, uncontrolled ([place]) S: improving controlled. Had been titrated on toujeo up to 24 units, amaryl 4mg , metformin 1000mg  twice a day. Patient states sugars have drastically improved- he has started taking the toujeo on an as needed basis. If he is 120 or under he does not take the toujeo. Usually takes 10  units if above 120. Has not had any lows on that regimen. This morning was 128 but has not remembered to check many other times of the day.  Exercise and diet- weight has crept up and states lots of birthdays last week and was eating more sweets. Missed exercise last week. 3 birthdays in family and anniversary for parents last week.  Lab Results  Component Value Date   HGBA1C 6.0 04/25/2016   HGBA1C 10.1 01/08/2016   HGBA1C 6.7 10/24/2015   A/P: drastic improvement. Continue regimen max metformin, amaryl 4mg , toujeo 10 units if CBG >120 in evening   Hypertension S: controlled on losartan alone BP Readings from Last 3 Encounters:  04/25/16 116/80  04/15/16 112/78  03/18/16 118/80  A/P:Continue current medications- remains controlled   Return in about 14 weeks (around 08/01/2016).  Orders Placed This Encounter  Procedures  . POCT glycosylated hemoglobin (Hb A1C)   Return precautions advised.  Garret Reddish, MD

## 2016-05-08 ENCOUNTER — Other Ambulatory Visit: Payer: Self-pay | Admitting: Family Medicine

## 2016-06-02 NOTE — Progress Notes (Signed)
Gabriel [name] Gabriel [name], Gabriel [name] [id number] Phone: [phone] Subjective:    I'm seeing this patient by the request  of:  Garret Reddish, MD   CC: left elbow pain f/u  [id number]  Gabriel [name] is a 50 y.o. male coming in with complaint of left elbow pain. Patient states his been going on multiple months. Patient Was found to have more of a lateral epicondylitis. Patient given a brace, home exercises, topical anti-inflammatories and icing regimen. Patient At last follow-up was approximately 30% improvement. Patient declined formal physical therapy. Patient will continue with conservative therapy at this moment. Patient states Now approximately 85% better. States that he has not have any weakness. Notices he can do daily activities without any significant pain. When he does some heavy lifting he notices the discomfort.     Past Medical History:  Diagnosis Date  . Arthritis    "joints; from where I've had surgeries" (08/17/2013)  . Chest pain    a. 08/2013 [facility] CTA: Ca score 238 (95%), LM nl, LAD mod stenosis in D2 area, D1 mild ost stenosis, D2 no signif dzs, LCX small, RCA nl.  . Coronary artery disease    [facility] (08/18/13):  pLAD 50, CFX and RCA normal.  EF 55-65%.  LAD lesion FFR:  0.88 (normal).  Med rx recommended.    [name] Dysphagia    Upper endoscopy 11/2013 - without obvious stricture s/p Maloney dilation.   [name] GERD (gastroesophageal reflux disease)   . Hyperlipidemia    a. Dx 3y ago - not on statin.  [name] Hypertension    a. Dx 3y ago.  [name] Hypothyroidism    a. on replacement.  . Obesity   . Sleep apnea   . Snoring   . Type II diabetes mellitus ([place])    Past Surgical History:  Procedure Laterality Date  . ANKLE SURGERY Right    "had a growth in it; cut it out" (08/17/2013)  . CARDIAC CATHETERIZATION N/A 09/28/2015   Procedure: Left Heart Cath and Coronary Angiography;  Surgeon: Peter M Martinique, MD;  Location: [facility];   Service: Cardiovascular;  Laterality: N/A;  . CHOLECYSTECTOMY    . KNEE ARTHROSCOPY Right X 2  . LEFT HEART CATHETERIZATION WITH CORONARY ANGIOGRAM N/A 08/18/2013   Procedure: LEFT HEART CATHETERIZATION WITH CORONARY ANGIOGRAM;  Surgeon: Peter M Martinique, MD;  Location: [facility];  Service: Cardiovascular;  Laterality: N/A;  . SHOULDER ARTHROSCOPY W/ ROTATOR CUFF REPAIR Left    Social History   Social History  . Marital status: Single    Spouse name: N/A  . Number of children: 0  . Years of education: N/A   Occupational History  . Unemployed    Social History Main Topics  . Smoking status: Former Smoker    Packs/day: 1.50    Years: 24.00    Types: Cigarettes    Quit date: 09/10/2007  . Smokeless tobacco: Never Used  . Alcohol use No  . Drug use: No  . Sexual activity: Yes   Other Topics Concern  . Not on file   Social History Narrative   Lives in [place] with parents (cares for parents).  Never married. Unemployed.     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[employer] (stops intermittently while caring for parents)   No Known Allergies Family History  Problem Relation Age of Onset  . Adopted: Yes  . Coronary artery disease Father     s/p MI in his  early 30's->CABG x 5 @ age 76, currently 15.  . Diabetes Father   . Stroke Father   . Hypertension Father   . Hypertension Mother   . Obesity Mother   . Diabetes Mother   . Diabetes Brother   . Obesity Brother   . Asthma Maternal Grandfather     Past medical history, social, surgical and family history all reviewed in electronic medical record.  No pertanent information unless stated regarding to the chief complaint.   Review of Systems: No headache, visual changes, nausea, vomiting, diarrhea, constipation, dizziness, abdominal pain, skin rash, fevers, chills, night sweats, weight loss, swollen lymph nodes, body aches, joint swelling, muscle aches, chest pain, shortness of breath, mood changes.   Objective  There were no vitals  taken for this visit.  General: No apparent distress alert and oriented x3 mood and affect normal, dressed appropriately.  HEENT: Pupils equal, extraocular movements intact  Respiratory: Patient's speak in full sentences and does not appear short of breath  Cardiovascular: No lower extremity edema, non tender, no erythema  Skin: Warm dry intact with no signs of infection or rash on extremities or on axial skeleton.  Abdomen: Soft nontender  Neuro: Cranial nerves II through XII are intact, neurovascularly intact in all extremities with 2+ DTRs and 2+ pulses.  Lymph: No lymphadenopathy of posterior or anterior cervical chain or axillae bilaterally.  Gait normal with good balance and coordination.  MSK:  Non tender with full range of motion and good stability and symmetric strength and tone of shoulders,  wrist, hip, knee and ankles bilaterally.  Elbow:left  Unremarkable to inspection. Range of motion full pronation, supination, flexion, extension. Strength is full to all of the above directions Stable to varus, valgus stress. Negative moving valgus stress test. Nontender on exam Ulnar nerve does not sublux. Negative cubital tunnel Tinel's. Contralateral elbow unremarkable  Improvement from previous exam    Impression and Recommendations:     This case required medical decision making of moderate complexity.      Note: This dictation was prepared with Dragon dictation along with smaller phrase technology. Any transcriptional errors that result from this process are unintentional.

## 2016-06-03 ENCOUNTER — Encounter: Payer: Self-pay | Admitting: Family Medicine

## 2016-06-03 ENCOUNTER — Ambulatory Visit (INDEPENDENT_AMBULATORY_CARE_PROVIDER_SITE_OTHER): Payer: No Typology Code available for payment source | Admitting: Family Medicine

## 2016-06-03 DIAGNOSIS — M7712 Lateral epicondylitis, left elbow: Secondary | ICD-10-CM

## 2016-06-03 NOTE — Assessment & Plan Note (Signed)
Doing very well at this time. No changes in management. Discussed icing regimen. Patient will start to increase activity as tolerated. Follow-up again as needed.

## 2016-06-03 NOTE — Patient Instructions (Signed)
Good to see you  You are doing great  Continue to lift with thumbs up or underhand.  Avoid any heavy lifting.  See me again in 4-6 weeks if not all the way gone.

## 2016-07-01 ENCOUNTER — Ambulatory Visit (INDEPENDENT_AMBULATORY_CARE_PROVIDER_SITE_OTHER): Payer: No Typology Code available for payment source | Admitting: Family Medicine

## 2016-07-01 VITALS — HR 80 | Temp 98.4°F | Wt 306.4 lb

## 2016-07-01 DIAGNOSIS — R197 Diarrhea, unspecified: Secondary | ICD-10-CM

## 2016-07-01 DIAGNOSIS — A09 Infectious gastroenteritis and colitis, unspecified: Secondary | ICD-10-CM | POA: Diagnosis not present

## 2016-07-01 NOTE — Patient Instructions (Signed)
Pick up stool collection kit from lab. All tests should be back within a week but one of them lactoferrin may be back in 2-3 days and if still having diarrhea would start you on antibiotic at that point

## 2016-07-02 ENCOUNTER — Encounter: Payer: Self-pay | Admitting: Family Medicine

## 2016-07-02 NOTE — Progress Notes (Signed)
Subjective:  Gabriel [name] is a 50 y.o. year old very pleasant male patient who presents for/with See problem oriented charting ROS- no fever/chills. Has had nausea and vomiting and diarrhea. No recent constipation. Mild fatigue.see any ROS included in HPI as well.   Past Medical History-  Patient Active Problem List   Diagnosis Date Noted  . Coronary Artery Disease 08/26/2013    Priority: High  . Diabetes mellitus type 2, uncontrolled ([place]) 08/10/2013    Priority: High  . 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 Morbid obesity ([place]) 08/10/2013    Priority: Medium  . HTN (hypertension) 02/06/2011    Priority: Medium  . Hypothyroidism 02/06/2011    Priority: Medium  . Hyperlipidemia 02/06/2011    Priority: Medium  . GERD (gastroesophageal reflux disease) 02/01/2015    Priority: Low  . Dysphagia 09/30/2013    Priority: Low  . Left lateral epicondylitis 03/18/2016  . Abnormal nuclear stress test 09/28/2015    Medications- reviewed and updated Current Outpatient Prescriptions  Medication Sig Dispense Refill  . acetaminophen (TYLENOL) 500 MG tablet Take 1,000 mg by mouth every 6 (six) hours as needed for mild pain or moderate pain. Reported on 01/09/2016    . aspirin EC 81 MG EC tablet Take 1 tablet (81 mg total) by mouth daily.    . Diclofenac Sodium (PENNSAID) 2 % SOLN Place 2 application onto the skin 2 (two) times daily. 112 g 3  . glucose blood (ONE TOUCH TEST STRIPS) test strip Up to 4 times daily.  Onetouch Verio.  E11.65 100 each 12  . ibuprofen (ADVIL,MOTRIN) 200 MG tablet Take 400 mg by mouth every 6 (six) hours as needed for mild pain or moderate pain. Reported on 01/09/2016    . Insulin Glargine (TOUJEO SOLOSTAR) 300 UNIT/ML SOPN Inject 10 Units into the skin daily. Up to 30 units daily as directed by your doctor 5 pen 3  . Insulin Pen Needle 31G X 8 MM MISC 1 each by Does not apply route daily. 100 each 3  . Lancets (ONETOUCH ULTRASOFT) lancets Up  to 4 times daily.  Onetouch Verio.  DxE11.65 100 each 12  . levothyroxine (SYNTHROID, LEVOTHROID) 100 MCG tablet Take 1 tablet (100 mcg total) by mouth daily. 30 tablet 5  . losartan (COZAAR) 100 MG tablet Take 1 tablet (100 mg total) by mouth daily. 30 tablet 5  . metFORMIN (GLUCOPHAGE) 1000 MG tablet Take 1 tablet (1,000 mg total) by mouth 2 (two) times daily with a meal. 60 tablet 11  . Multiple Vitamins-Minerals (MULTIVITAMIN,TX-MINERALS) tablet Take 1 tablet by mouth daily.      . nitroGLYCERIN (NITROSTAT) 0.4 MG SL tablet Place 1 tablet (0.4 mg total) under the tongue every 5 (five) minutes as needed for chest pain (CP or SOB). 25 tablet 3  . pantoprazole (PROTONIX) 40 MG tablet TAKE ONE TABLET BY MOUTH ONCE DAILY 30 tablet 5  . simvastatin (ZOCOR) 40 MG tablet TAKE ONE TABLET BY MOUTH AT BEDTIME 30 tablet 5   No current facility-administered medications for this visit.     Objective: Pulse 80   Temp 98.4 F (36.9 C)   Wt (!) 306 lb 6.4 oz (139 kg)   BMI 45.25 kg/m  Gen: NAD, resting comfortably and well appearing Mildly dry mucus membranes CV: RRR no murmurs rubs or gallops Lungs: CTAB no crackles, wheeze, rhonchi Abdomen: soft/nontender/nondistended/hyperactive bowel sounds. No rebound or guarding.  Ext: no edema Skin: warm, dry, no  rash over abdomen  Assessment/Plan:  Diarrhea of presumed infectious origin - Plan: Clostridium Difficile by PCR, Fecal lactoferrin, quant, Stool culture S: Patient started with vomiting on Thursday after traveling home from [place] with parents. Then also started on Friday evening to diarrhea. Vomiting continued but ended on Saturday. He started immodium yesterday after 12 watery BMs. No BMs yet today ut feels very bloated after the immodium. He states he did have a somewhat rare hamburger in [place] along with a chili dogs. No sick contacts and people he was with are not sick either.  ROS- also no blood in stool or melena. No bilious or  bloody emesis A/P: suspect gastroenteritis as cause of symptoms- with volume of stools still through yesterday on day 4 of illness- will get stool studies as noted. If fecal lactoferrin positive would treat empirically- held off today as well appearing. Continue to push fluids while awaiting workup. No more nausea/vomiting so held off on zofran or similar.   Known thyroid issus- would check TSH if continued issues but doubt would cause such an acute change, needs to be updated with next labs regardless   Orders Placed This Encounter  Procedures  . Clostridium Difficile by PCR    Order Specific Question:   Is your patient experiencing loose or watery stools (3 or more in 24 hours)?    Answer:   Yes    Order Specific Question:   Has the patient received laxatives in the last 24 hours?    Answer:   No    Order Specific Question:   Has a negative Cdiff test resulted in the last 7 days?    Answer:   No  . Stool culture  . Fecal lactoferrin, quant   Return precautions advised.  Garret Reddish, MD

## 2016-07-15 ENCOUNTER — Ambulatory Visit: Payer: No Typology Code available for payment source | Admitting: Family Medicine

## 2016-07-26 ENCOUNTER — Encounter: Payer: Self-pay | Admitting: Family Medicine

## 2016-07-26 ENCOUNTER — Ambulatory Visit (INDEPENDENT_AMBULATORY_CARE_PROVIDER_SITE_OTHER): Payer: No Typology Code available for payment source | Admitting: Family Medicine

## 2016-07-26 VITALS — BP 104/84 | HR 81 | Temp 97.9°F | Ht 69.0 in | Wt 312.2 lb

## 2016-07-26 DIAGNOSIS — E1165 Type 2 diabetes mellitus with hyperglycemia: Secondary | ICD-10-CM | POA: Diagnosis not present

## 2016-07-26 DIAGNOSIS — E785 Hyperlipidemia, unspecified: Secondary | ICD-10-CM | POA: Diagnosis not present

## 2016-07-26 DIAGNOSIS — I10 Essential (primary) hypertension: Secondary | ICD-10-CM

## 2016-07-26 DIAGNOSIS — IMO0001 Reserved for inherently not codable concepts without codable children: Secondary | ICD-10-CM

## 2016-07-26 DIAGNOSIS — E039 Hypothyroidism, unspecified: Secondary | ICD-10-CM

## 2016-07-26 DIAGNOSIS — Z794 Long term (current) use of insulin: Secondary | ICD-10-CM

## 2016-07-26 NOTE — Progress Notes (Signed)
Subjective:  Gabriel [name] is a 50 y.o. year old very pleasant male patient who presents for/with See problem oriented charting ROS- No chest pain or shortness of breath. No headache or blurry vision. .see any ROS included in HPI as well.   Past Medical History-  Patient Active Problem List   Diagnosis Date Noted  . Coronary Artery Disease 08/26/2013    Priority: High  . Diabetes mellitus type 2, uncontrolled ([place]) 08/10/2013    Priority: High  . 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 Morbid obesity ([place]) 08/10/2013    Priority: Medium  . HTN (hypertension) 02/06/2011    Priority: Medium  . Hypothyroidism 02/06/2011    Priority: Medium  . Hyperlipidemia 02/06/2011    Priority: Medium  . GERD (gastroesophageal reflux disease) 02/01/2015    Priority: Low  . Dysphagia 09/30/2013    Priority: Low  . Left lateral epicondylitis 03/18/2016  . Abnormal nuclear stress test 09/28/2015    Medications- reviewed and updated Current Outpatient Prescriptions  Medication Sig Dispense Refill  . acetaminophen (TYLENOL) 500 MG tablet Take 1,000 mg by mouth every 6 (six) hours as needed for mild pain or moderate pain. Reported on 01/09/2016    . aspirin EC 81 MG EC tablet Take 1 tablet (81 mg total) by mouth daily.    [name] glucose blood (ONE TOUCH TEST STRIPS) test strip Up to 4 times daily.  Onetouch Verio.  E11.65 100 each 12  . ibuprofen (ADVIL,MOTRIN) 200 MG tablet Take 400 mg by mouth every 6 (six) hours as needed for mild pain or moderate pain. Reported on 01/09/2016    . Insulin Glargine (TOUJEO SOLOSTAR) 300 UNIT/ML SOPN Inject 10 Units into the skin daily. Up to 30 units daily as directed by your doctor 5 pen 3  . Insulin Pen Needle 31G X 8 MM MISC 1 each by Does not apply route daily. 100 each 3  . Lancets (ONETOUCH ULTRASOFT) lancets Up to 4 times daily.  Onetouch Verio.  DxE11.65 100 each 12  . levothyroxine (SYNTHROID, LEVOTHROID) 100 MCG tablet Take 1 tablet (100  mcg total) by mouth daily. 30 tablet 5  . losartan (COZAAR) 100 MG tablet Take 1 tablet (100 mg total) by mouth daily. 30 tablet 5  . metFORMIN (GLUCOPHAGE) 1000 MG tablet Take 1 tablet (1,000 mg total) by mouth 2 (two) times daily with a meal. 60 tablet 11  . Multiple Vitamins-Minerals (MULTIVITAMIN,TX-MINERALS) tablet Take 1 tablet by mouth daily.      . nitroGLYCERIN (NITROSTAT) 0.4 MG SL tablet Place 1 tablet (0.4 mg total) under the tongue every 5 (five) minutes as needed for chest pain (CP or SOB). 25 tablet 3  . pantoprazole (PROTONIX) 40 MG tablet TAKE ONE TABLET BY MOUTH ONCE DAILY 30 tablet 5  . simvastatin (ZOCOR) 40 MG tablet TAKE ONE TABLET BY MOUTH AT BEDTIME 30 tablet 5   No current facility-administered medications for this visit.     Objective: BP 104/84 (BP Location: Left Arm, Patient Position: Sitting, Cuff Size: Large)   Pulse 81   Temp 97.9 F (36.6 C) (Oral)   Ht 5\' 9"  (1.753 m)   Wt (!) 312 lb 3.2 oz (141.6 kg)   SpO2 95%   BMI 46.10 kg/m  Gen: NAD, resting comfortably CV: RRR no murmurs rubs or gallops Lungs: CTAB no crackles, wheeze, rhonchi Ext: no edema Sad at times when discussing mom who I sick in hospital  Assessment/Plan:  Diabetes mellitus  type 2, uncontrolled ([place]) S: suspect remains controlled. On metformin max dose,  toujeo 10 units if CBG >120 in evening (sparing) Patient has not been taking amaryl in sometimes- unclear why this was stopped CBGs- no nighttime sugar over 120 in 3 weeks- checks around 11 pm so has not used any toujeo Exercise and diet- doing a lot more fast food with mom being sick- has really slipped up on his eating habits.  Lab Results  Component Value Date   HGBA1C 6.0 04/25/2016   HGBA1C 10.1 01/08/2016   HGBA1C 6.7 10/24/2015   A/P: suspect may be some increase but hopeful remains under 7. To return next week for fasting labs. If remains controled- change diagnosis to controlled at follow up.   Hypothyroidism S: Lab  Results  Component Value Date   TSH 5.42 (H) 02/01/2015   On thyroid medication-levothyroxine 165mcg ROS-No hair or nail changes. No heat/cold intolerance. No constipation or diarrhea. Denies shakiness or anxiety.  A/P: update TSH with future labs  Hyperlipidemia S: controlled on simvastatin 40mg  in past with LDL 81. No myalgias.  Lab Results  Component Value Date   CHOL 147 03/16/2014   HDL 33.70 (L) 03/16/2014   LDLCALC 81 03/16/2014   LDLDIRECT 190.5 12/07/2010   TRIG 163.0 (H) 03/16/2014   CHOLHDL 4 03/16/2014   A/P: update future fasting labs for lipids   HTN (hypertension) S: controlled on losartan alone A/P:Continue current medicatoins  Follow up plan not discussed- will make plan based on a1c  Orders Placed This Encounter  Procedures  . Hemoglobin A1c    [place]    Standing Status:   Future    Standing Expiration Date:   07/26/2017  . CBC    Standing Status:   Future    Standing Expiration Date:   07/26/2017  . Comprehensive metabolic panel    [place]    Standing Status:   Future    Standing Expiration Date:   07/26/2017  . Lipid panel    Standing Status:   Future    Standing Expiration Date:   07/26/2017  . TSH    Standing Status:   Future    Standing Expiration Date:   07/26/2017   Return precautions advised.  Garret Reddish, MD

## 2016-07-26 NOTE — Patient Instructions (Addendum)
Schedule a lab visit at the check out desk within a week. Return for future fasting labs meaning nothing but water after midnight please. Ok to take your medications with water.   Sounds like things are looking good still- lets get the labs to make sure

## 2016-07-26 NOTE — Progress Notes (Signed)
Pre visit review using our clinic review tool, if applicable. No additional management support is needed unless otherwise documented below in the visit note. 

## 2016-07-27 NOTE — Assessment & Plan Note (Signed)
S: suspect remains controlled. On metformin max dose,  toujeo 10 units if CBG >120 in evening (sparing) Patient has not been taking amaryl in sometimes- unclear why this was stopped CBGs- no nighttime sugar over 120 in 3 weeks- checks around 11 pm so has not used any toujeo Exercise and diet- doing a lot more fast food with mom being sick- has really slipped up on his eating habits.  Lab Results  Component Value Date   HGBA1C 6.0 04/25/2016   HGBA1C 10.1 01/08/2016   HGBA1C 6.7 10/24/2015   A/P: suspect may be some increase but hopeful remains under 7. To return next week for fasting labs

## 2016-07-27 NOTE — Assessment & Plan Note (Addendum)
S: Lab Results  Component Value Date   TSH 5.42 (H) 02/01/2015   On thyroid medication-levothyroxine 133mcg ROS-No hair or nail changes. No heat/cold intolerance. No constipation or diarrhea. Denies shakiness or anxiety.  A/P: update TSH with future labs

## 2016-07-27 NOTE — Assessment & Plan Note (Signed)
S: controlled on losartan alone A/P:Continue current medicatoins

## 2016-07-27 NOTE — Assessment & Plan Note (Signed)
S: controlled on simvastatin 40mg  in past with LDL 81. No myalgias.  Lab Results  Component Value Date   CHOL 147 03/16/2014   HDL 33.70 (L) 03/16/2014   LDLCALC 81 03/16/2014   LDLDIRECT 190.5 12/07/2010   TRIG 163.0 (H) 03/16/2014   CHOLHDL 4 03/16/2014   A/P: update future fasting labs for lipids

## 2016-09-13 ENCOUNTER — Other Ambulatory Visit: Payer: No Typology Code available for payment source

## 2016-09-15 ENCOUNTER — Other Ambulatory Visit: Payer: Self-pay | Admitting: Family Medicine

## 2016-10-22 ENCOUNTER — Ambulatory Visit (INDEPENDENT_AMBULATORY_CARE_PROVIDER_SITE_OTHER): Payer: No Typology Code available for payment source | Admitting: Family Medicine

## 2016-10-22 ENCOUNTER — Encounter: Payer: Self-pay | Admitting: Family Medicine

## 2016-10-22 VITALS — BP 128/78 | HR 88 | Temp 98.4°F | Ht 69.0 in | Wt 302.6 lb

## 2016-10-22 DIAGNOSIS — R0602 Shortness of breath: Secondary | ICD-10-CM

## 2016-10-22 DIAGNOSIS — I2510 Atherosclerotic heart disease of native coronary artery without angina pectoris: Secondary | ICD-10-CM | POA: Diagnosis not present

## 2016-10-22 DIAGNOSIS — R079 Chest pain, unspecified: Secondary | ICD-10-CM

## 2016-10-22 DIAGNOSIS — IMO0001 Reserved for inherently not codable concepts without codable children: Secondary | ICD-10-CM

## 2016-10-22 DIAGNOSIS — E1165 Type 2 diabetes mellitus with hyperglycemia: Secondary | ICD-10-CM | POA: Diagnosis not present

## 2016-10-22 DIAGNOSIS — E039 Hypothyroidism, unspecified: Secondary | ICD-10-CM | POA: Diagnosis not present

## 2016-10-22 DIAGNOSIS — Z794 Long term (current) use of insulin: Secondary | ICD-10-CM | POA: Diagnosis not present

## 2016-10-22 LAB — POCT GLYCOSYLATED HEMOGLOBIN (HGB A1C): HEMOGLOBIN A1C: 9.4

## 2016-10-22 NOTE — Assessment & Plan Note (Addendum)
S: A week or so ago was over 500. Now sugars are over 200 for most parteven with 500mg  metformin and toujeo and 15 units. Metformin up to 1000mg  twice a day. Has cut breads down, stopped eating out fast foods. Had been to a casino and was drinking pepsi/cokes a few weeks ago.  Lab Results  Component Value Date   HGBA1C 9.4 10/22/2016  A/P:  a1c has drastically worsened. Will titrate toujeo until fasting cbgs under 120- see avs. Increase toujeo to 18 units to start. Continue metformin 1000mg  BID. Advised to update me 1 week. Advised likely 2-3 week in office check in. Needs to tighten up diet, improve exercise.

## 2016-10-22 NOTE — Progress Notes (Signed)
Pre visit review using our clinic review tool, if applicable. No additional management support is needed unless otherwise documented below in the visit note. 

## 2016-10-22 NOTE — Progress Notes (Signed)
Subjective:  Gabriel [name] is a 51 y.o. year old very pleasant male patient who presents for/with See problem oriented charting ROS- chest pain and SOB as noted below. Likely orthopnea.no fever or chills   Past Medical History-  Patient Active Problem List   Diagnosis Date Noted  . Coronary Artery Disease 08/26/2013    Priority: High  . Diabetes mellitus type 2, uncontrolled ([place]) 08/10/2013    Priority: High  . 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 Morbid obesity ([place]) 08/10/2013    Priority: Medium  . HTN (hypertension) 02/06/2011    Priority: Medium  . Hypothyroidism 02/06/2011    Priority: Medium  . Hyperlipidemia 02/06/2011    Priority: Medium  . GERD (gastroesophageal reflux disease) 02/01/2015    Priority: Low  . Dysphagia 09/30/2013    Priority: Low  . Left lateral epicondylitis 03/18/2016  . Abnormal nuclear stress test 09/28/2015    Medications- reviewed and updated Current Outpatient Prescriptions  Medication Sig Dispense Refill  . acetaminophen (TYLENOL) 500 MG tablet Take 1,000 mg by mouth every 6 (six) hours as needed for mild pain or moderate pain. Reported on 01/09/2016    . aspirin EC 81 MG EC tablet Take 1 tablet (81 mg total) by mouth daily.    [name] glucose blood (ONE TOUCH TEST STRIPS) test strip Up to 4 times daily.  Onetouch Verio.  E11.65 100 each 12  . ibuprofen (ADVIL,MOTRIN) 200 MG tablet Take 400 mg by mouth every 6 (six) hours as needed for mild pain or moderate pain. Reported on 01/09/2016    . Insulin Glargine (TOUJEO SOLOSTAR) 300 UNIT/ML SOPN Inject 10 Units into the skin daily. Up to 30 units daily as directed by your doctor 5 pen 3  . Insulin Pen Needle 31G X 8 MM MISC 1 each by Does not apply route daily. 100 each 3  . Lancets (ONETOUCH ULTRASOFT) lancets Up to 4 times daily.  Onetouch Verio.  DxE11.65 100 each 12  . levothyroxine (SYNTHROID, LEVOTHROID) 100 MCG tablet TAKE ONE TABLET BY MOUTH ONCE DAILY 90 tablet 1  .  losartan (COZAAR) 100 MG tablet TAKE ONE TABLET BY MOUTH ONCE DAILY 90 tablet 1  . metFORMIN (GLUCOPHAGE) 1000 MG tablet Take 1 tablet (1,000 mg total) by mouth 2 (two) times daily with a meal. 60 tablet 11  . Multiple Vitamins-Minerals (MULTIVITAMIN,TX-MINERALS) tablet Take 1 tablet by mouth daily.      . nitroGLYCERIN (NITROSTAT) 0.4 MG SL tablet Place 1 tablet (0.4 mg total) under the tongue every 5 (five) minutes as needed for chest pain (CP or SOB). 25 tablet 3  . pantoprazole (PROTONIX) 40 MG tablet TAKE ONE TABLET BY MOUTH ONCE DAILY 30 tablet 5  . simvastatin (ZOCOR) 40 MG tablet TAKE ONE TABLET BY MOUTH AT BEDTIME 30 tablet 5   No current facility-administered medications for this visit.     Objective: BP 128/78 (BP Location: Left Arm, Patient Position: Sitting, Cuff Size: Large)   Pulse 88   Temp 98.4 F (36.9 C) (Oral)   Ht 5\' 9"  (1.753 m)   Wt (!) 302 lb 9.6 oz (137.3 kg)   SpO2 95%   BMI 44.69 kg/m  Gen: NAD, resting comfortably No JVD but some hepatojugular reflux noted CV: RRR no murmurs rubs or gallops Lungs: CTAB no crackles, wheeze, rhonchi Abdomen: soft/nontender/nondistended/normal bowel sounds.   Ext: no edema Skin: warm, dry, no rash  EKG: NSR with rate 75, poor baseline, normal intervals,  no hypertrophy, no significant st or t wave changes- v5 appears affected by movement.   Assessment/Plan:  Chest pain/orthopnea in setting of nonobstructive Coronary Artery Disease S: Had some chest pain the other night- nitro helped some. Stated right sided and lasted several hours. Felt some pressure into right arm as well as left arm.  Worse with laying down, better with sitting up.  No exertional symptoms- when up and moving feels better in regards to chest pain but has gotten more winded with activities.   When laying down he does feel some short of breath even after that night. Cant seem to get comfortable sleeping unless he is sitting up. Denies weight gain- has had  weight loss likely due to poorly controlled diabetes. Not exercising. Feels slightly winded with activity.   Some other symptoms since this started a few years ago-Some low back pain and aching into side. Feels run down some A/P: EKG reassuring. No history CHF. Had a heart catheterization last January which showed nonobstructive CAD which is reassuring as well. Symptoms were atypical. I am more concerned about dyspnea. Will get basic labs but also to include BNP. Suspect we may need to get echocardiogram. Could consider x-ray as well but lungs were clear and doubt infection like pneumonia. Urgent/emergent return precautions were given. he may also choose to follow up with cardiology on Monday- visits with his    Diabetes mellitus type 2, uncontrolled ([employer]) S: A week or so ago was over 500. Now sugars are over 200 for most parteven with 500mg  metformin and toujeo and 15 units. Metformin up to 1000mg  twice a day. Has cut breads down, stopped eating out fast foods. Had been to a casino and was drinking pepsi/cokes a few weeks ago.  Lab Results  Component Value Date   HGBA1C 9.4 10/22/2016  A/P:  a1c has drastically worsened. Will titrate toujeo until fasting cbgs under 120- see avs. Increase toujeo to 18 units to start. Continue metformin 1000mg  BID. Advised to update me 1 week. Advised likely 2-3 week in office check in. Needs to tighten up diet, improve exercise.    Return in about 14 weeks (around 01/28/2017) for follow up at absolute latest but want to stay in contact and may see you sooner.  Orders Placed This Encounter  Procedures  . Brain Natriuretic Peptide  . CBC with Differential/Platelet  . Comprehensive metabolic panel    [place]  . TSH    [place]  . POCT glycosylated hemoglobin (Hb A1C)  . EKG 12-Lead    Order Specific Question:   Where should this test be performed    Answer:   Other   The duration of face-to-face time during this visit was greater than 30 minutes. Greater than  50% of this time was spent in counseling, explanation of diagnosis, planning of further management, and/or coordination of care including discussion about improving lifestyle and exercise as well as a1c, titration of insulin, discussing return precautiosn for chest pain.   Garret Reddish, MD

## 2016-10-22 NOTE — Patient Instructions (Addendum)
Please stop by lab before you go  If worsening shortness of breath or recurrent chest pain- need to seek care immediately  Increase toujeo to 18 units.  If fasting blood sugar above for at least 2 days: >200 add 3 units 150-199 add 2 units 120-149 add 1 unit  Update [place] in 1 week with blood sugars- goal to get you under 120 fasting

## 2016-10-22 NOTE — Assessment & Plan Note (Addendum)
S: Had some chest pain the other night- nitro helped some. Stated right sided and lasted several hours. Felt some pressure into right arm as well as left arm.  Worse with laying down, better with sitting up.  No exertional symptoms- when up and moving feels better in regards to chest pain but has gotten more winded with activities.   When laying down he does feel some short of breath even after that night. Cant seem to get comfortable sleeping unless he is sitting up. Denies weight gain- has had weight loss likely due to poorly controlled diabetes. Not exercising. Feels slightly winded with activity.   Some other symptoms since this started a few years ago-Some low back pain and aching into side. Feels run down some A/P: EKG reassuring. Had a heart catheterization last January which showed nonobstructive CAD which is reassuring as well. Symptoms were atypical. I am more concerned about dyspnea. Will get basic labs but also to include BNP. Suspect we may need to get echocardiogram. Could consider x-ray as well but lungs were clear and doubt infection like pneumonia. Urgent/emergent return precautions were given.

## 2016-10-23 ENCOUNTER — Other Ambulatory Visit: Payer: Self-pay

## 2016-10-23 ENCOUNTER — Telehealth: Payer: Self-pay

## 2016-10-23 ENCOUNTER — Other Ambulatory Visit (INDEPENDENT_AMBULATORY_CARE_PROVIDER_SITE_OTHER): Payer: No Typology Code available for payment source

## 2016-10-23 DIAGNOSIS — E875 Hyperkalemia: Secondary | ICD-10-CM | POA: Diagnosis not present

## 2016-10-23 LAB — COMPREHENSIVE METABOLIC PANEL
ALK PHOS: 112 U/L (ref 39–117)
ALT: 81 U/L — AB (ref 0–53)
AST: 45 U/L — AB (ref 0–37)
Albumin: 4.4 g/dL (ref 3.5–5.2)
BILIRUBIN TOTAL: 0.7 mg/dL (ref 0.2–1.2)
BUN: 11 mg/dL (ref 6–23)
CO2: 27 meq/L (ref 19–32)
CREATININE: 1.01 mg/dL (ref 0.40–1.50)
Calcium: 9.3 mg/dL (ref 8.4–10.5)
Chloride: 98 mEq/L (ref 96–112)
GFR: 83.27 mL/min (ref >60.00)
GLUCOSE: 327 mg/dL — AB (ref 70–99)
Potassium: 6.4 mEq/L (ref 3.5–5.1)
SODIUM: 134 meq/L — AB (ref 135–145)
TOTAL PROTEIN: 6.9 g/dL (ref 6.0–8.3)

## 2016-10-23 LAB — CBC WITH DIFFERENTIAL/PLATELET
BASOS ABS: 0.1 10*3/uL (ref 0.0–0.1)
Basophils Relative: 0.9 % (ref 0.0–3.0)
EOS ABS: 0.3 10*3/uL (ref 0.0–0.7)
Eosinophils Relative: 3.9 % (ref 0.0–5.0)
HCT: 43.4 % (ref 39.0–52.0)
Hemoglobin: 14.5 g/dL (ref 13.0–17.0)
LYMPHS ABS: 2.4 10*3/uL (ref 0.7–4.0)
Lymphocytes Relative: 32 % (ref 12.0–46.0)
MCHC: 33.4 g/dL (ref 30.0–36.0)
MCV: 85.5 fl (ref 78.0–100.0)
MONOS PCT: 6.7 % (ref 3.0–12.0)
Monocytes Absolute: 0.5 10*3/uL (ref 0.1–1.0)
NEUTROS ABS: 4.2 10*3/uL (ref 1.4–7.7)
NEUTROS PCT: 56.5 % (ref 43.0–77.0)
PLATELETS: 228 10*3/uL (ref 150.0–400.0)
RBC: 5.08 Mil/uL (ref 4.22–5.81)
RDW: 13.4 % (ref 11.5–15.5)
WBC: 7.4 10*3/uL (ref 4.0–10.5)

## 2016-10-23 LAB — BRAIN NATRIURETIC PEPTIDE: PRO B NATRI PEPTIDE: 12 pg/mL (ref 0.0–100.0)

## 2016-10-23 LAB — POTASSIUM: Potassium: 4.2 mEq/L (ref 3.5–5.1)

## 2016-10-23 LAB — TSH: TSH: 3.95 u[IU]/mL (ref 0.35–4.50)

## 2016-10-23 NOTE — Telephone Encounter (Signed)
Call received from [facility]. Potassium level is 6.4. Dr. Yong Channel was made aware immediately.  Advised to call patient and assess for symptoms. If patient is asymptomatic to have patient come in for STAT redraw of Potassium.  I called patient and he denies chest palpitations or dizziness. He verbalized understanding of coming into the office for a STAT potassium. I entered the order. I did advise patient on the phone that if he becomes symptomatic to go to the hospital immediately.

## 2016-10-24 ENCOUNTER — Telehealth: Payer: Self-pay | Admitting: Family Medicine

## 2016-10-24 NOTE — Telephone Encounter (Signed)
Called patient and provided results.

## 2016-10-24 NOTE — Telephone Encounter (Signed)
Pt would like results of labs. Please call back. °

## 2016-10-25 ENCOUNTER — Ambulatory Visit: Payer: No Typology Code available for payment source | Admitting: Family Medicine

## 2016-11-26 ENCOUNTER — Telehealth: Payer: Self-pay | Admitting: Family Medicine

## 2016-11-26 ENCOUNTER — Telehealth: Payer: Self-pay

## 2016-11-26 NOTE — Telephone Encounter (Signed)
Pt into office and samples of Toujeo provided. I instructed patient to add 3 units to his current dosage and update [place] in a week. Patient verbalized understanding

## 2016-11-26 NOTE — Telephone Encounter (Signed)
Verbal order received for patient to receive sample medication. Patient teaching completed by provider.   Medication (including dosage):Toujeo Solostar 300Units/ml  Lot #:[id number] Expiration: 30.09.2019  Qty: 2

## 2016-11-26 NOTE — Telephone Encounter (Signed)
° °  Pt call to report his numbers for the last 3 weeks  He said he has been running 200 in the morning and the lowest has been 175. Pt also said he need some samples of the below med    Insulin Glargine (TOUJEO SOLOSTAR) 300 UNIT/ML SOPN

## 2016-11-26 NOTE — Telephone Encounter (Signed)
Spoke with patient and let him know we have sample available. He will come by office and pick up. Please advise regarding dosage

## 2016-11-26 NOTE — Telephone Encounter (Signed)
How many units is he currently using?   Given #s over 200- would add 3 units to current dose and update [place] 1 week.

## 2016-12-18 ENCOUNTER — Other Ambulatory Visit: Payer: Self-pay | Admitting: Family Medicine

## 2016-12-22 ENCOUNTER — Other Ambulatory Visit: Payer: Self-pay | Admitting: Family Medicine

## 2017-02-25 ENCOUNTER — Other Ambulatory Visit: Payer: Self-pay | Admitting: Family Medicine

## 2017-02-28 ENCOUNTER — Ambulatory Visit (INDEPENDENT_AMBULATORY_CARE_PROVIDER_SITE_OTHER): Payer: No Typology Code available for payment source | Admitting: Family Medicine

## 2017-02-28 ENCOUNTER — Encounter: Payer: Self-pay | Admitting: Family Medicine

## 2017-02-28 ENCOUNTER — Telehealth: Payer: Self-pay | Admitting: Family Medicine

## 2017-02-28 VITALS — BP 124/70 | HR 92 | Temp 98.4°F | Ht 69.0 in | Wt 305.6 lb

## 2017-02-28 DIAGNOSIS — M79605 Pain in left leg: Secondary | ICD-10-CM

## 2017-02-28 DIAGNOSIS — E039 Hypothyroidism, unspecified: Secondary | ICD-10-CM | POA: Diagnosis not present

## 2017-02-28 DIAGNOSIS — IMO0001 Reserved for inherently not codable concepts without codable children: Secondary | ICD-10-CM

## 2017-02-28 DIAGNOSIS — E1165 Type 2 diabetes mellitus with hyperglycemia: Secondary | ICD-10-CM

## 2017-02-28 LAB — TSH: TSH: 1.98 u[IU]/mL (ref 0.35–4.50)

## 2017-02-28 LAB — D-DIMER, QUANTITATIVE (NOT AT ARMC): D DIMER QUANT: 0.28 ug{FEU}/mL (ref <0.50)

## 2017-02-28 LAB — HEMOGLOBIN A1C: HEMOGLOBIN A1C: 8.3 % — AB (ref 4.6–6.5)

## 2017-02-28 MED ORDER — DICLOFENAC SODIUM 75 MG PO TBEC: 75.0000 mg | DELAYED_RELEASE_TABLET | Freq: Two times a day (BID) | ORAL | 0 refills | Status: DC

## 2017-02-28 NOTE — Patient Instructions (Addendum)
Please stop by lab before you go  If test is positive I am going to have you take the blood thinner xarelto 15mg  twice a day until we get a study done on Monday or Tuesday of next week.

## 2017-02-28 NOTE — Progress Notes (Signed)
Subjective:  Gabriel [name] is a 51 y.o. year old very pleasant male patient who presents for/with See problem oriented charting ROS- complains of left leg pain and swelling. Hurts into left hip as well. No fever or chills or rash on leg   Past Medical History-  Patient Active Problem List   Diagnosis Date Noted  . Coronary Artery Disease 08/26/2013    Priority: High  . Diabetes mellitus type 2, uncontrolled ([place]) 08/10/2013    Priority: High  . 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 Morbid obesity ([place]) 08/10/2013    Priority: Medium  . HTN (hypertension) 02/06/2011    Priority: Medium  . Hypothyroidism 02/06/2011    Priority: Medium  . Hyperlipidemia 02/06/2011    Priority: Medium  . GERD (gastroesophageal reflux disease) 02/01/2015    Priority: Low  . Dysphagia 09/30/2013    Priority: Low  . Left lateral epicondylitis 03/18/2016  . Abnormal nuclear stress test 09/28/2015    Medications- reviewed and updated Current Outpatient Prescriptions  Medication Sig Dispense Refill  . acetaminophen (TYLENOL) 500 MG tablet Take 1,000 mg by mouth every 6 (six) hours as needed for mild pain or moderate pain. Reported on 01/09/2016    . aspirin EC 81 MG EC tablet Take 1 tablet (81 mg total) by mouth daily.    [name] glucose blood (ONE TOUCH TEST STRIPS) test strip Up to 4 times daily.  Onetouch Verio.  E11.65 100 each 12  . ibuprofen (ADVIL,MOTRIN) 200 MG tablet Take 400 mg by mouth every 6 (six) hours as needed for mild pain or moderate pain. Reported on 01/09/2016    . Insulin Glargine (TOUJEO SOLOSTAR) 300 UNIT/ML SOPN Inject 10 Units into the skin daily. Up to 30 units daily as directed by your doctor 5 pen 3  . Insulin Pen Needle 31G X 8 MM MISC 1 each by Does not apply route daily. 100 each 3  . Lancets (ONETOUCH ULTRASOFT) lancets Up to 4 times daily.  Onetouch Verio.  DxE11.65 100 each 12  . levothyroxine (SYNTHROID, LEVOTHROID) 100 MCG tablet TAKE ONE TABLET BY  MOUTH ONCE DAILY 90 tablet 1  . losartan (COZAAR) 100 MG tablet TAKE ONE TABLET BY MOUTH ONCE DAILY 90 tablet 1  . metFORMIN (GLUCOPHAGE) 1000 MG tablet TAKE ONE TABLET BY MOUTH TWICE DAILY WITH MEALS 180 tablet 3  . Multiple Vitamins-Minerals (MULTIVITAMIN,TX-MINERALS) tablet Take 1 tablet by mouth daily.      . nitroGLYCERIN (NITROSTAT) 0.4 MG SL tablet Place 1 tablet (0.4 mg total) under the tongue every 5 (five) minutes as needed for chest pain (CP or SOB). 25 tablet 3  . pantoprazole (PROTONIX) 40 MG tablet TAKE ONE TABLET BY MOUTH ONCE DAILY 90 tablet 1  . simvastatin (ZOCOR) 40 MG tablet TAKE ONE TABLET BY MOUTH AT BEDTIME 30 tablet 5   No current facility-administered medications for this visit.     Objective: BP 124/70 (BP Location: Left Arm, Patient Position: Sitting, Cuff Size: Large)   Pulse 92   Temp 98.4 F (36.9 C) (Oral)   Ht 5\' 9"  (1.753 m)   Wt (!) 305 lb 9.6 oz (138.6 kg)   SpO2 95%   BMI 45.13 kg/m  Gen: NAD, resting comfortably CV: RRR no murmurs rubs or gallops Lungs: CTAB no crackles, wheeze, rhonchi  Ext: left leg 10 cm below tibial plateau measures 2 cm larger than the right. No erythema. Some pain to palpation in lateral calf as well as  posterior calf. trace edema both legs.  Skin: warm, dry, no rash over leg  Assessment/Plan:  Left leg pain - Plan: D-dimer, Quantitative S: Patient with left leg pain for about 10 days. Not getting worse. Lower leg below knee on lateral side most prominent but also some pain in back of calf. Rates as a mild to moderate aching pain. Has some pain into left hip as well. No paresthesias in the leg. Aspirin helps pain some. Has not been worsening but persistent and bothering him.  A/P: Stat d- dimer today to rule out DVT as cause was negative. Had sent him home with xarelto #7 15mg  to take BID if test was positive. He can discard this sample. Given pain in hip and pain in lower leg but not centered in calf only- suspect this could  be from low back. With a1c poorly controlled avoid prednisone- trial of nsaids. If no better in 10 days could consider PT.   Uncontrolled type 2 diabetes mellitus - Plan: Hemoglobin A1c Lab Results  Component Value Date   HGBA1C 8.3 (H) 02/28/2017  used need for bloodwork as chance to update diabetes lab- he has not followed up with me as we had previously discussed. Was not sure if prednisone would be a reasonable choice but with poor control obviously is not. in regards to poorly controlled diabetes it is under improved control. He will continue his toujeo and metformin 1000mg  BID- he needs to come back in to discuss optimizing control   Hypothyroidism, unspecified type - Plan: TSH Lab Results  Component Value Date   TSH 1.98 02/28/2017  excellent control   Orders Placed This Encounter  Procedures  . D-dimer, Quantitative  . Hemoglobin A1c    [place]  . TSH    [place]    Meds ordered this encounter  Medications  . diclofenac (VOLTAREN) 75 MG EC tablet    Sig: Take 1 tablet (75 mg total) by mouth 2 (two) times daily.    Dispense:  20 tablet    Refill:  0    Return precautions advised.  Garret Reddish, MD

## 2017-02-28 NOTE — Telephone Encounter (Addendum)
Received call from team health regarding patient's d-dimer result. It is 0.28. This is a negative result. The note from today's visit from his PCP is completed though the instructions in the after visit summary were reviewed and it appears that he gave him Xarelto to start if the test was positive. Attempted to contact the patient to let them know the test was negative. There is no answer. I left my office number for him to contact back. FYI to be sent to patient's PCP.  Received message back from PCP. He states he will send the patient a mychart message regarding the result.

## 2017-02-28 NOTE — Telephone Encounter (Signed)
Spoke with Dr. Caryl Bis. Patient aware not to take unless test is positive and discussed with me. I sent him a mychart message about results.

## 2017-02-28 NOTE — Progress Notes (Signed)
Medication Samples have been provided to the patient.  Drug name: Xarelto      Strength: 15mg         Qty: 2 bottles (14 tabs)  LOT: [id number]  Exp.Date: 01-20  Dosing instructions: Take 1 tablet twice a day  The patient has been instructed regarding the correct time, dose, and frequency of taking this medication, including desired effects and most common side effects.   Lonia Mad Southern Hizer 3:14 PM 02/28/2017

## 2017-03-03 ENCOUNTER — Telehealth: Payer: Self-pay | Admitting: Family Medicine

## 2017-03-03 NOTE — Telephone Encounter (Signed)
Pt would like blood work results °

## 2017-03-26 ENCOUNTER — Other Ambulatory Visit: Payer: Self-pay | Admitting: Family Medicine

## 2017-03-27 ENCOUNTER — Encounter: Payer: Self-pay | Admitting: Family Medicine

## 2017-03-27 ENCOUNTER — Ambulatory Visit (INDEPENDENT_AMBULATORY_CARE_PROVIDER_SITE_OTHER): Payer: No Typology Code available for payment source | Admitting: Family Medicine

## 2017-03-27 VITALS — BP 126/66 | HR 76 | Temp 98.1°F | Ht 69.0 in | Wt 302.0 lb

## 2017-03-27 DIAGNOSIS — M79605 Pain in left leg: Secondary | ICD-10-CM | POA: Diagnosis not present

## 2017-03-27 DIAGNOSIS — E1165 Type 2 diabetes mellitus with hyperglycemia: Secondary | ICD-10-CM | POA: Diagnosis not present

## 2017-03-27 DIAGNOSIS — IMO0001 Reserved for inherently not codable concepts without codable children: Secondary | ICD-10-CM

## 2017-03-27 MED ORDER — GLIMEPIRIDE 2 MG PO TABS: 2.0000 mg | ORAL_TABLET | Freq: Every day | ORAL | 3 refills | Status: DC

## 2017-03-27 NOTE — Assessment & Plan Note (Signed)
BMI >35 and diabetes and OA- we discussed the role of weight loss with OA and importance of weigh tloss for this as well as diabetes. Will need weigh tloss to get a1c at target.

## 2017-03-27 NOTE — Assessment & Plan Note (Signed)
S: poorly  Controlled on no toujeo at this point- will take off the list. He is taking metformin 1g BID CBGs- usually 140s before he takes his metformin at night.  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 Active but no regular exercise- advised regular exercise Lab Results  Component Value Date   HGBA1C 8.3 (H) 02/28/2017   HGBA1C 9.4 10/22/2016   HGBA1C 6.0 04/25/2016   A/P: will add amaryl 2mg  to max dose metformin with 3 month repeat.

## 2017-03-27 NOTE — Patient Instructions (Addendum)
will add amaryl 2mg  to max dose metformin with 3 month repeat  Take the diclofenac twice a day for 5 days to see if we can get the hip and back calmed back down. Chiropractor could help. Happy to refer you to orthopedics or sports medicine. I think weight loss is the best thing you can do to take stress off the hip and low back as I suspect some arthritis here.

## 2017-03-27 NOTE — Progress Notes (Signed)
Subjective:  Gabriel [name] is a 51 y.o. year old very pleasant male patient who presents for/with See problem oriented charting ROS- no low blood sugars. No chest pain or shortness of breath. Does have fair amount of fatigue.    Past Medical History-  Patient Active Problem List   Diagnosis Date Noted  . Coronary Artery Disease 08/26/2013    Priority: High  . Diabetes mellitus type 2, uncontrolled ([place]) 08/10/2013    Priority: High  . 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 Morbid obesity ([place]) 08/10/2013    Priority: Medium  . HTN (hypertension) 02/06/2011    Priority: Medium  . Hypothyroidism 02/06/2011    Priority: Medium  . Hyperlipidemia 02/06/2011    Priority: Medium  . GERD (gastroesophageal reflux disease) 02/01/2015    Priority: Low  . Dysphagia 09/30/2013    Priority: Low  . Left lateral epicondylitis 03/18/2016    Medications- reviewed and updated Current Outpatient Prescriptions  Medication Sig Dispense Refill  . acetaminophen (TYLENOL) 500 MG tablet Take 1,000 mg by mouth every 6 (six) hours as needed for mild pain or moderate pain. Reported on 01/09/2016    . aspirin EC 81 MG EC tablet Take 1 tablet (81 mg total) by mouth daily.    . diclofenac (VOLTAREN) 75 MG EC tablet Take 1 tablet (75 mg total) by mouth 2 (two) times daily. 20 tablet 0  . glucose blood (ONE TOUCH TEST STRIPS) test strip Up to 4 times daily.  Onetouch Verio.  E11.65 100 each 12  . ibuprofen (ADVIL,MOTRIN) 200 MG tablet Take 400 mg by mouth every 6 (six) hours as needed for mild pain or moderate pain. Reported on 01/09/2016    . Insulin Pen Needle 31G X 8 MM MISC 1 each by Does not apply route daily. 100 each 3  . Lancets (ONETOUCH ULTRASOFT) lancets Up to 4 times daily.  Onetouch Verio.  DxE11.65 100 each 12  . levothyroxine (SYNTHROID, LEVOTHROID) 100 MCG tablet TAKE ONE TABLET BY MOUTH ONCE DAILY 90 tablet 1  . losartan (COZAAR) 100 MG tablet TAKE ONE TABLET BY MOUTH  ONCE DAILY 90 tablet 1  . metFORMIN (GLUCOPHAGE) 1000 MG tablet TAKE ONE TABLET BY MOUTH TWICE DAILY WITH MEALS 180 tablet 3  . Multiple Vitamins-Minerals (MULTIVITAMIN,TX-MINERALS) tablet Take 1 tablet by mouth daily.      . nitroGLYCERIN (NITROSTAT) 0.4 MG SL tablet Place 1 tablet (0.4 mg total) under the tongue every 5 (five) minutes as needed for chest pain (CP or SOB). 25 tablet 3  . pantoprazole (PROTONIX) 40 MG tablet TAKE ONE TABLET BY MOUTH ONCE DAILY 90 tablet 1  . simvastatin (ZOCOR) 40 MG tablet TAKE ONE TABLET BY MOUTH AT BEDTIME 30 tablet 5  . glimepiride (AMARYL) 2 MG tablet Take 1 tablet (2 mg total) by mouth daily before breakfast. 30 tablet 3   No current facility-administered medications for this visit.     Objective: BP 126/66 (BP Location: Left Arm, Patient Position: Sitting, Cuff Size: Large)   Pulse 76   Temp 98.1 F (36.7 C) (Oral)   Ht 5\' 9"  (1.753 m)   Wt (!) 302 lb (137 kg)   SpO2 95%   BMI 44.60 kg/m  Gen: NAD, resting comfortably CV: RRR no murmurs rubs or gallops Lungs: CTAB no crackles, wheeze, rhonchi Abdomen: soft/nontender/nondistended/normal bowel sounds. obesity Ext: no edema Skin: warm, dry Back: pain with palpation of low back on left. Pain with IR of left  hip. No calf pain  Assessment/Plan:  Left leg pain S: left leg pain from hip down to the knee and also into left groin. Some left side low back pain. Apparently has a bulging disc in the back reported previously. Declines PT for now. Diclofenac, aspirin, tylenol help- all similarly. Has been going on about 5-6 weeks. Not worening. No longer having pain in calf- mainly below knee and into lateral lower leg as well as up side of leg into buttocks and low back A/P: may have some hip OA- pain with IR of the hip on exam. I wonder if this is sciatica coming from prior bulging disc- want to hold off on prednisone given poor diabetes control. Also  Discussed 5 days of diclofenac twice a day to see if  that would help- he is also going to consider chiropractor, sports medicine, or orthopedics- will call back if needs referra.   Diabetes mellitus type 2, uncontrolled ([place]) S: poorly  Controlled on no toujeo at this point- will take off the list. He is taking metformin 1g BID CBGs- usually 140s before he takes his metformin at night.  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 Active but no regular exercise- advised regular exercise Lab Results  Component Value Date   HGBA1C 8.3 (H) 02/28/2017   HGBA1C 9.4 10/22/2016   HGBA1C 6.0 04/25/2016   A/P: will add amaryl 2mg  to max dose metformin with 3 month repeat.   Morbid obesity ([facility]) BMI >35 and diabetes and OA- we discussed the role of weight loss with OA and importance of weigh tloss for this as well as diabetes. Will need weigh tloss to get a1c at target.   3 month repeat  Meds ordered this encounter  Medications  . glimepiride (AMARYL) 2 MG tablet    Sig: Take 1 tablet (2 mg total) by mouth daily before breakfast.    Dispense:  30 tablet    Refill:  3    Return precautions advised.  Garret Reddish, MD

## 2017-06-27 ENCOUNTER — Encounter: Payer: Self-pay | Admitting: Physician Assistant

## 2017-06-27 ENCOUNTER — Ambulatory Visit: Payer: No Typology Code available for payment source | Admitting: Family Medicine

## 2017-06-27 ENCOUNTER — Ambulatory Visit (INDEPENDENT_AMBULATORY_CARE_PROVIDER_SITE_OTHER): Payer: Self-pay | Admitting: Physician Assistant

## 2017-06-27 VITALS — BP 122/74 | HR 59 | Temp 98.7°F | Wt 319.5 lb

## 2017-06-27 DIAGNOSIS — E1165 Type 2 diabetes mellitus with hyperglycemia: Secondary | ICD-10-CM

## 2017-06-27 DIAGNOSIS — IMO0001 Reserved for inherently not codable concepts without codable children: Secondary | ICD-10-CM

## 2017-06-27 LAB — POCT GLYCOSYLATED HEMOGLOBIN (HGB A1C): Hemoglobin A1C: 6.2

## 2017-06-27 LAB — BASIC METABOLIC PANEL
BUN: 12 mg/dL (ref 6–23)
CHLORIDE: 101 meq/L (ref 96–112)
CO2: 28 meq/L (ref 19–32)
Calcium: 9.2 mg/dL (ref 8.4–10.5)
Creatinine, Ser: 0.95 mg/dL (ref 0.40–1.50)
GFR: 89.13 mL/min (ref >60.00)
GLUCOSE: 189 mg/dL — AB (ref 70–99)
POTASSIUM: 4 meq/L (ref 3.5–5.1)
SODIUM: 138 meq/L (ref 135–145)

## 2017-06-27 MED ORDER — GLIMEPIRIDE 2 MG PO TABS: 2.0000 mg | ORAL_TABLET | Freq: Every day | ORAL | 3 refills | Status: DC

## 2017-06-27 NOTE — Progress Notes (Signed)
Gabriel [name] is a 51 y.o. male is here to discuss: DM  History of Present Illness:   Chief Complaint  Patient presents with  . Follow-up    DM    HPI   Currently taking Metformin 1 g BID and Amaryl 2 mg daily.   HgbA1c 8.3 on 02/28/17 HgbA1c 9.4 on 10/22/16 HgbA1c 6.0 on 04/25/16  CBG's at home: 80-100; checks his blood sugar in the morning prior to eating. Thinks he had 1 low blood sugar since last being in our office, felt "off" and his blood sugar was around 70.  Does not have scheduled exercise, but states that he isn't completely sedentary during the day. May start walking now that it's cooler. 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 No chest pain or SOB. Beverages: only flavored water (brought into the office today, verified it is no calorie)  Lab Results  Component Value Date   HGBA1C 6.2 06/27/2017   He has not been to the eye doctor recently. Foot exam completed today.  Health Maintenance Due  Topic Date Due  . OPHTHALMOLOGY EXAM  10/26/2016  . COLONOSCOPY  04/21/2017  . FOOT EXAM  04/25/2017    Past Medical History:  Diagnosis Date  . Arthritis    "joints; from where I've had surgeries" (08/17/2013)  . Chest pain    a. 08/2013 Cardia CTA: Ca score 238 (95%), LM nl, LAD mod stenosis in D2 area, D1 mild ost stenosis, D2 no signif dzs, LCX small, RCA nl.  . Coronary artery disease    LHC (08/18/13):  pLAD 50, CFX and RCA normal.  EF 55-65%.  LAD lesion FFR:  0.88 (normal).  Med rx recommended.    [name] Dysphagia    Upper endoscopy 11/2013 - without obvious stricture s/p Maloney dilation.   [name] GERD (gastroesophageal reflux disease)   . Hyperlipidemia    a. Dx 3y ago - not on statin.  [name] Hypertension    a. Dx 3y ago.  [name] Hypothyroidism    a. on replacement.  . Obesity   . Sleep apnea   . Snoring   . Type II diabetes mellitus ([place])      Social History   Social History  . Marital status: Single    Spouse name: N/A    . Number of children: 0  . Years of education: N/A   Occupational History  . Unemployed    Social History Main Topics  . Smoking status: Former Smoker    Packs/day: 1.50    Years: 24.00    Types: Cigarettes    Quit date: 09/10/2007  . Smokeless tobacco: Never Used  . Alcohol use No  . Drug use: No  . Sexual activity: Yes   Other Topics Concern  . Not on file   Social History Narrative   Lives in [place] with parents (cares for parents).  Never married. Unemployed.     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[employer] (stops intermittently while caring for parents)    Past Surgical History:  Procedure Laterality Date  . ANKLE SURGERY Right    "had a growth in it; cut it out" (08/17/2013)  . CARDIAC CATHETERIZATION N/A 09/28/2015   Procedure: Left Heart Cath and Coronary Angiography;  Surgeon: Peter M Martinique, MD;  Location: [facility];  Service: Cardiovascular;  Laterality: N/A;  . CHOLECYSTECTOMY    . KNEE ARTHROSCOPY Right X 2  . LEFT HEART CATHETERIZATION  WITH CORONARY ANGIOGRAM N/A 08/18/2013   Procedure: LEFT HEART CATHETERIZATION WITH CORONARY ANGIOGRAM;  Surgeon: Peter M Martinique, MD;  Location: [facility];  Service: Cardiovascular;  Laterality: N/A;  . SHOULDER ARTHROSCOPY W/ ROTATOR CUFF REPAIR Left     Family History  Problem Relation Age of Onset  . Adopted: Yes  . Coronary artery disease Father        s/p MI in his early 30's->CABG x 5 @ age 68, currently 32.  . Diabetes Father   . Stroke Father   . Hypertension Father   . Hypertension Mother   . Obesity Mother   . Diabetes Mother   . Diabetes Brother   . Obesity Brother   . Asthma Maternal Grandfather     PMHx, SurgHx, SocialHx, FamHx, Medications, and Allergies were reviewed in the Visit Navigator and updated as appropriate.   Patient Active Problem List   Diagnosis Date Noted  . Left lateral epicondylitis 03/18/2016  . GERD (gastroesophageal reflux disease) 02/01/2015  . Complex sleep apnea syndrome  10/14/2013  . Dysphagia 09/30/2013  . Coronary Artery Disease 08/26/2013  . Diabetes mellitus type 2, uncontrolled ([place]) 08/10/2013  . Morbid obesity ([place]) 08/10/2013  . HTN (hypertension) 02/06/2011  . Hypothyroidism 02/06/2011  . Hyperlipidemia 02/06/2011    Social History  Substance Use Topics  . Smoking status: Former Smoker    Packs/day: 1.50    Years: 24.00    Types: Cigarettes    Quit date: 09/10/2007  . Smokeless tobacco: Never Used  . Alcohol use No    Current Medications and Allergies:    Current Outpatient Prescriptions:  .  acetaminophen (TYLENOL) 500 MG tablet, Take 1,000 mg by mouth every 6 (six) hours as needed for mild pain or moderate pain. Reported on 01/09/2016, Disp: , Rfl:  .  aspirin EC 81 MG EC tablet, Take 1 tablet (81 mg total) by mouth daily., Disp: , Rfl:  .  diclofenac (VOLTAREN) 75 MG EC tablet, Take 1 tablet (75 mg total) by mouth 2 (two) times daily., Disp: 20 tablet, Rfl: 0 .  glimepiride (AMARYL) 2 MG tablet, Take 1 tablet (2 mg total) by mouth daily before breakfast., Disp: 30 tablet, Rfl: 3 .  glucose blood (ONE TOUCH TEST STRIPS) test strip, Up to 4 times daily.  Onetouch Verio.  E11.65, Disp: 100 each, Rfl: 12 .  ibuprofen (ADVIL,MOTRIN) 200 MG tablet, Take 400 mg by mouth every 6 (six) hours as needed for mild pain or moderate pain. Reported on 01/09/2016, Disp: , Rfl:  .  Insulin Pen Needle 31G X 8 MM MISC, 1 each by Does not apply route daily., Disp: 100 each, Rfl: 3 .  Lancets (ONETOUCH ULTRASOFT) lancets, Up to 4 times daily.  Onetouch Verio.  DxE11.65, Disp: 100 each, Rfl: 12 .  levothyroxine (SYNTHROID, LEVOTHROID) 100 MCG tablet, TAKE ONE TABLET BY MOUTH ONCE DAILY, Disp: 90 tablet, Rfl: 1 .  losartan (COZAAR) 100 MG tablet, TAKE ONE TABLET BY MOUTH ONCE DAILY, Disp: 90 tablet, Rfl: 1 .  metFORMIN (GLUCOPHAGE) 1000 MG tablet, TAKE ONE TABLET BY MOUTH TWICE DAILY WITH MEALS, Disp: 180 tablet, Rfl: 3 .  Multiple Vitamins-Minerals  (MULTIVITAMIN,TX-MINERALS) tablet, Take 1 tablet by mouth daily.  , Disp: , Rfl:  .  nitroGLYCERIN (NITROSTAT) 0.4 MG SL tablet, Place 1 tablet (0.4 mg total) under the tongue every 5 (five) minutes as needed for chest pain (CP or SOB)., Disp: 25 tablet, Rfl: 3 .  pantoprazole (PROTONIX) 40 MG tablet, TAKE  ONE TABLET BY MOUTH ONCE DAILY, Disp: 90 tablet, Rfl: 1 .  simvastatin (ZOCOR) 40 MG tablet, TAKE ONE TABLET BY MOUTH AT BEDTIME, Disp: 30 tablet, Rfl: 5  No Known Allergies  Review of Systems   ROS  Negative unless otherwise specified by HPI.  Vitals:   Vitals:   06/27/17 1040  BP: 122/74  Pulse: (!) 59  Temp: 98.7 F (37.1 C)  TempSrc: Oral  SpO2: 96%  Weight: (!) 319 lb 8 oz (144.9 kg)     Body mass index is 47.18 kg/m.   Physical Exam:    Physical Exam  Constitutional: He appears well-developed. He is cooperative.  Non-toxic appearance. He does not have a sickly appearance. He does not appear ill. No distress.  obese  Cardiovascular: Normal rate, regular rhythm, S1 normal, S2 normal, normal heart sounds and normal pulses.   No LE edema  Pulmonary/Chest: Effort normal and breath sounds normal.  Neurological: He is alert. GCS eye subscore is 4. GCS verbal subscore is 5. GCS motor subscore is 6.  Skin: Skin is warm, dry and intact.  Psychiatric: He has a normal mood and affect. His speech is normal and behavior is normal.  Nursing note and vitals reviewed.   Results for orders placed or performed in visit on 06/27/17  POCT A1C  Result Value Ref Range   Hemoglobin A1C 6.2      Assessment and Plan:    Raegan was seen today for follow-up.  Diagnoses and all orders for this visit:  Uncontrolled type 2 diabetes mellitus without complication, without long-term current use of insulin (HCC) HgbA1c today is down to 6.2%, congratulated patient. Continue current regimen of Metformin 1 g BID and 2 mg Amaryl daily. Discussed carbohydrate portion control at today's visit  and need for routine exercise. Will check BMP today. Follow-up in 3 months. Recommend faxing information from eye doctor when he returns. Foot exam completed today. Provided handouts on "Low Blood Sugar" and "Dining out with DM" for patient to review. Told patient to call us if he has additional consistent low blood sugars. -     POCT A1C -     Basic metabolic panel  Other orders -     glimepiride (AMARYL) 2 MG tablet; Take 1 tablet (2 mg total) by mouth daily before breakfast.   . Reviewed expectations re: course of current medical issues. . Discussed self-management of symptoms. . Outlined signs and symptoms indicating need for more acute intervention. . Patient verbalized understanding and all questions were answered. . See orders for this visit as documented in the electronic medical record. . Patient received an After Visit Summary.   Inda Coke, PA-C [place], [place] 06/27/2017  Follow-up: Return in about 3 months (around 09/27/2017) for DM f/u.

## 2017-06-27 NOTE — Patient Instructions (Addendum)
It was great to meet you!  Continue to check blood sugars, as discussed, let us know if they get low.  Follow-up in 3 months.  If you go to the eye doctor, please fax your records to our office.

## 2017-07-01 ENCOUNTER — Other Ambulatory Visit: Payer: Self-pay | Admitting: Family Medicine

## 2017-07-30 ENCOUNTER — Other Ambulatory Visit: Payer: Self-pay | Admitting: Family Medicine

## 2017-08-04 ENCOUNTER — Telehealth: Payer: Self-pay | Admitting: Family Medicine

## 2017-08-04 NOTE — Telephone Encounter (Signed)
Appt scheduled for tomorrow 08/05/17 at 1 pm.

## 2017-08-04 NOTE — Telephone Encounter (Signed)
Patient Name: Gabriel [name] DOB: 1966/04/28 Initial Comment Caller states c/o shortness of breath, coughing, shoulder and upper back pain. Nurse Assessment Nurse: Ronnald Ramp, RN, [place] Date/Time (Eastern Time): 08/04/2017 12:17:55 PM Confirm and document reason for call. If symptomatic, describe symptoms. ---Caller states he has had a productive cough for 3 days. Also with right shoulder and upper back pain that gets worse with moving arm for 4 days. Denies SOB. Does the patient have any new or worsening symptoms? ---Yes Will a triage be completed? ---Yes Related visit to physician within the last 2 weeks? ---No Does the PT have any chronic conditions? (i.e. diabetes, asthma, etc.) ---Yes List chronic conditions. ---HTN, High Cholesterol, Diabetes Is this a behavioral health or substance abuse call? ---No Guidelines Guideline Title Affirmed Question Affirmed Notes Cough - Acute Productive Cough Shoulder Pain [1] MODERATE pain (e.g., interferes with normal activities) AND [2] present > 3 days Final Disposition User See PCP When Office is Open (within 3 days) Ronnald Ramp, RN, Miranda Comments Appt scheduled for tomorrow, 12/27 at 1pm with Dr. Yong Channel Referrals REFERRED TO PCP OFFICE Caller Disagree/Comply Comply Caller Understands Yes PreDisposition Call Doctor

## 2017-08-04 NOTE — Telephone Encounter (Signed)
Patient called in having "shortness of breath" along with "stabbing pain on RT side". Sent patient to triage. Awaiting note from triage.

## 2017-08-05 ENCOUNTER — Encounter: Payer: Self-pay | Admitting: Family Medicine

## 2017-08-05 ENCOUNTER — Ambulatory Visit (INDEPENDENT_AMBULATORY_CARE_PROVIDER_SITE_OTHER): Payer: PRIVATE HEALTH INSURANCE | Admitting: Family Medicine

## 2017-08-05 VITALS — BP 100/70 | HR 83 | Temp 97.5°F | Ht 69.0 in | Wt 321.6 lb

## 2017-08-05 DIAGNOSIS — M62830 Muscle spasm of back: Secondary | ICD-10-CM

## 2017-08-05 MED ORDER — METAXALONE 800 MG PO TABS: 400.0000 mg | ORAL_TABLET | Freq: Three times a day (TID) | ORAL | 0 refills | Status: DC | PRN

## 2017-08-05 NOTE — Patient Instructions (Addendum)
I wonder if your fall triggered some muscle spasm in your back- also could have come from your cough (think you have a mild upper respiratory infection).   Use heat 3x a day for 20 minutes for 5 days. Can also use muscle relaxant. Also can use tylenol for pain  If you have fever, worsening pain, shortness of breath worsening, other new symptoms- see [place] back immediately. Suspect this will resolve 1-2 weeks  Also verbally discussed after avs printed- no driving for 8 hours after skelaxin

## 2017-08-05 NOTE — Progress Notes (Signed)
Subjective:  Gabriel [name] is a 51 y.o. year old very pleasant male patient who presents for/with See problem oriented charting ROS- No chest pain. Only with coughing fits- shortness of breath. No headache or blurry vision.  No arm weakness or paresthesias.   Past Medical History-  Patient Active Problem List   Diagnosis Date Noted  . Coronary Artery Disease 08/26/2013    Priority: High  . Diabetes mellitus type 2, uncontrolled ([place]) 08/10/2013    Priority: High  . 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 Morbid obesity ([place]) 08/10/2013    Priority: Medium  . HTN (hypertension) 02/06/2011    Priority: Medium  . Hypothyroidism 02/06/2011    Priority: Medium  . Hyperlipidemia 02/06/2011    Priority: Medium  . GERD (gastroesophageal reflux disease) 02/01/2015    Priority: Low  . Dysphagia 09/30/2013    Priority: Low  . Left lateral epicondylitis 03/18/2016    Medications- reviewed and updated Current Outpatient Medications  Medication Sig Dispense Refill  . acetaminophen (TYLENOL) 500 MG tablet Take 1,000 mg by mouth every 6 (six) hours as needed for mild pain or moderate pain. Reported on 01/09/2016    . aspirin EC 81 MG EC tablet Take 1 tablet (81 mg total) by mouth daily.    . diclofenac (VOLTAREN) 75 MG EC tablet Take 1 tablet (75 mg total) by mouth 2 (two) times daily. 20 tablet 0  . glimepiride (AMARYL) 2 MG tablet Take 1 tablet (2 mg total) by mouth daily before breakfast. 30 tablet 3  . glucose blood (ONE TOUCH TEST STRIPS) test strip Up to 4 times daily.  Onetouch Verio.  E11.65 100 each 12  . ibuprofen (ADVIL,MOTRIN) 200 MG tablet Take 400 mg by mouth every 6 (six) hours as needed for mild pain or moderate pain. Reported on 01/09/2016    . Insulin Pen Needle 31G X 8 MM MISC 1 each by Does not apply route daily. 100 each 3  . Lancets (ONETOUCH ULTRASOFT) lancets Up to 4 times daily.  Onetouch Verio.  DxE11.65 100 each 12  . levothyroxine (SYNTHROID,  LEVOTHROID) 100 MCG tablet TAKE ONE TABLET BY MOUTH ONCE DAILY 90 tablet 1  . losartan (COZAAR) 100 MG tablet TAKE ONE TABLET BY MOUTH ONCE DAILY 90 tablet 1  . metFORMIN (GLUCOPHAGE) 1000 MG tablet TAKE ONE TABLET BY MOUTH TWICE DAILY WITH MEALS 180 tablet 3  . Multiple Vitamins-Minerals (MULTIVITAMIN,TX-MINERALS) tablet Take 1 tablet by mouth daily.      . nitroGLYCERIN (NITROSTAT) 0.4 MG SL tablet Place 1 tablet (0.4 mg total) under the tongue every 5 (five) minutes as needed for chest pain (CP or SOB). 25 tablet 3  . pantoprazole (PROTONIX) 40 MG tablet TAKE 1 TABLET BY MOUTH ONCE DAILY 90 tablet 1  . simvastatin (ZOCOR) 40 MG tablet TAKE 1 TABLET BY MOUTH AT BEDTIME 30 tablet 5   Objective: BP 100/70 (BP Location: Left Arm, Patient Position: Sitting, Cuff Size: Large)   Pulse 83   Temp (!) 97.5 F (36.4 C) (Oral)   Ht 5\' 9"  (1.753 m)   Wt (!) 321 lb 9.6 oz (145.9 kg)   SpO2 94%   BMI 47.49 kg/m  Gen: NAD, resting comfortably CV: RRR no murmurs rubs or gallops Lungs: CTAB no crackles, wheeze, rhonchi Abdomen: soft/nontender/nondistended/normal bowel sounds. obese Ext: no edema Skin: warm, dry Neuro: good strength bilateral arms, intact distal sensation MSK: patient very tender in paraspinous muscles to right of thoracic  spine in about 2 x 2 cm area of tight muscle- some tenderness out to under right arm as well. Good range of motion in arm. No midline tenderness  Assessment/Plan:  Muscle spasm of back S: patient has been dealing with thoracic back pain  for 4 days- seems to go from right of spinal column out toward axilla. Hurts in right shoulder blade area. [place] a few weeks ago chasing dog and landed on right shoulder. Pain up to 7/10 but 2/10 right now. Feels like improving. Tylenol helps pain tremendously  Has had some mild URI symptoms over last week. Coughing and having some mucus- one episode was trying to cough something up and felt winded. No shortness of breath outside of  coughing fit other than baseline issues due to sedentary activity/obesity. No fever. Green phlegm  Skelaxin has helped with low back muscle spasms years ago- those issues have resolved.  A/P: MSK strain with muscle spasm/potential trigger point to right of spinal column in thoracic area= could have been triggered either by fall or by cough from URI. Regardless advised conservative care as per avs. Avoid nsaids if able with CAD history but may need to use this- given good relief with tylenol can stick with that for now though.   I doubt pneumonia- clear lung exam today- suspect has URI and discussed up to 2 weeks for resolution. Doubt cardiac without chest pain and improving symptoms. Some pleuritic pain but once again symptoms improving. Does do fair amount of travel but no unilateral swelling or calf pain to suggest DVT.   Patient Instructions  I wonder if your fall triggered some muscle spasm in your back- also could have come from your cough (think you have a mild upper respiratory infection).   Use heat 3x a day for 20 minutes for 5 days. Can also use muscle relaxant. Also can use tylenol for pain  If you have fever, worsening pain, shortness of breath worsening, other new symptoms- see [place] back immediately. Suspect this will resolve 1-2 weeks  Also verbally discussed after avs printed- no driving for 8 hours after skelaxin  Future Appointments  Date Time Provider [place]  09/25/2017 11:00 AM Marin Olp, MD [facility]  new acute condition in high risk patient with CAD, DM history as well as medication management.   Meds ordered this encounter  Medications  . metaxalone (SKELAXIN) 800 MG tablet    Sig: Take 0.5-1 tablets (400-800 mg total) by mouth 3 (three) times daily as needed for muscle spasms.    Dispense:  20 tablet    Refill:  0    Return precautions advised.  Garret Reddish, MD

## 2017-09-25 ENCOUNTER — Ambulatory Visit: Payer: No Typology Code available for payment source | Admitting: Family Medicine

## 2017-10-10 ENCOUNTER — Other Ambulatory Visit: Payer: Self-pay | Admitting: Family Medicine

## 2017-10-20 ENCOUNTER — Ambulatory Visit: Payer: PRIVATE HEALTH INSURANCE | Admitting: Family Medicine

## 2017-10-21 ENCOUNTER — Ambulatory Visit: Payer: PRIVATE HEALTH INSURANCE | Admitting: Family Medicine

## 2017-10-21 ENCOUNTER — Encounter: Payer: Self-pay | Admitting: Family Medicine

## 2018-01-19 ENCOUNTER — Emergency Department (HOSPITAL_COMMUNITY)
Admission: EM | Admit: 2018-01-19 | Discharge: 2018-01-19 | Disposition: A | Discharge status: Home / Self Care | Payer: Self-pay | Attending: Emergency Medicine | Admitting: Emergency Medicine

## 2018-01-19 ENCOUNTER — Other Ambulatory Visit: Payer: Self-pay

## 2018-01-19 ENCOUNTER — Emergency Department (HOSPITAL_COMMUNITY): Payer: Self-pay

## 2018-01-19 ENCOUNTER — Encounter (HOSPITAL_COMMUNITY): Payer: Self-pay

## 2018-01-19 DIAGNOSIS — I2510 Atherosclerotic heart disease of native coronary artery without angina pectoris: Secondary | ICD-10-CM | POA: Insufficient documentation

## 2018-01-19 DIAGNOSIS — Z9049 Acquired absence of other specified parts of digestive tract: Secondary | ICD-10-CM | POA: Insufficient documentation

## 2018-01-19 DIAGNOSIS — E039 Hypothyroidism, unspecified: Secondary | ICD-10-CM | POA: Insufficient documentation

## 2018-01-19 DIAGNOSIS — R1030 Lower abdominal pain, unspecified: Secondary | ICD-10-CM | POA: Insufficient documentation

## 2018-01-19 DIAGNOSIS — E119 Type 2 diabetes mellitus without complications: Secondary | ICD-10-CM | POA: Insufficient documentation

## 2018-01-19 DIAGNOSIS — R079 Chest pain, unspecified: Secondary | ICD-10-CM

## 2018-01-19 DIAGNOSIS — R109 Unspecified abdominal pain: Secondary | ICD-10-CM

## 2018-01-19 DIAGNOSIS — Z87891 Personal history of nicotine dependence: Secondary | ICD-10-CM | POA: Insufficient documentation

## 2018-01-19 DIAGNOSIS — Z7984 Long term (current) use of oral hypoglycemic drugs: Secondary | ICD-10-CM | POA: Insufficient documentation

## 2018-01-19 DIAGNOSIS — I10 Essential (primary) hypertension: Secondary | ICD-10-CM | POA: Insufficient documentation

## 2018-01-19 DIAGNOSIS — Z7982 Long term (current) use of aspirin: Secondary | ICD-10-CM | POA: Insufficient documentation

## 2018-01-19 DIAGNOSIS — Z79899 Other long term (current) drug therapy: Secondary | ICD-10-CM | POA: Insufficient documentation

## 2018-01-19 LAB — URINALYSIS, ROUTINE W REFLEX MICROSCOPIC
Bilirubin Urine: NEGATIVE
Glucose, UA: NEGATIVE mg/dL
Hgb urine dipstick: NEGATIVE
Ketones, ur: NEGATIVE mg/dL
Leukocytes, UA: NEGATIVE
NITRITE: NEGATIVE
PROTEIN: NEGATIVE mg/dL
SPECIFIC GRAVITY, URINE: 1.005 (ref 1.005–1.030)
pH: 5 (ref 5.0–8.0)

## 2018-01-19 LAB — COMPREHENSIVE METABOLIC PANEL
ALT: 59 U/L (ref 17–63)
AST: 38 U/L (ref 15–41)
Albumin: 3.9 g/dL (ref 3.5–5.0)
Alkaline Phosphatase: 85 U/L (ref 38–126)
Anion gap: 8 (ref 5–15)
BUN: 10 mg/dL (ref 6–20)
CHLORIDE: 104 mmol/L (ref 101–111)
CO2: 26 mmol/L (ref 22–32)
CREATININE: 1.05 mg/dL (ref 0.61–1.24)
Calcium: 9.3 mg/dL (ref 8.9–10.3)
GFR calc Af Amer: >60 mL/min (ref >60)
GFR calc non Af Amer: >60 mL/min (ref >60)
Glucose, Bld: 180 mg/dL — ABNORMAL HIGH (ref 65–99)
Potassium: 3.7 mmol/L (ref 3.5–5.1)
SODIUM: 138 mmol/L (ref 135–145)
Total Bilirubin: 0.6 mg/dL (ref 0.3–1.2)
Total Protein: 6.9 g/dL (ref 6.5–8.1)

## 2018-01-19 LAB — I-STAT TROPONIN, ED
TROPONIN I, POC: 0 ng/mL (ref 0.00–0.08)
Troponin i, poc: 0 ng/mL (ref 0.00–0.08)

## 2018-01-19 LAB — CBC
HEMATOCRIT: 42.4 % (ref 39.0–52.0)
Hemoglobin: 14 g/dL (ref 13.0–17.0)
MCH: 28.6 pg (ref 26.0–34.0)
MCHC: 33 g/dL (ref 30.0–36.0)
MCV: 86.5 fL (ref 78.0–100.0)
Platelets: 240 10*3/uL (ref 150–400)
RBC: 4.9 MIL/uL (ref 4.22–5.81)
RDW: 13.3 % (ref 11.5–15.5)
WBC: 7.5 10*3/uL (ref 4.0–10.5)

## 2018-01-19 LAB — LIPASE, BLOOD: LIPASE: 39 U/L (ref 11–51)

## 2018-01-19 NOTE — ED Provider Notes (Signed)
[facility] EMERGENCY DEPARTMENT Provider Note   CSN: [id number] Arrival date & time: 01/19/18  1103     History   Chief Complaint Chief Complaint  Patient presents with  . Headache  . Chest Pain    HPI Gabriel [name] is a 52 y.o. male.  HPI Patient is a 52 year old male with a past medical history of hypertension, CAD, HLD and GERD who comes in today complaining of right sided and substernal chest pain, right-sided flank and right lower quadrant abdominal pain, left shoulder stiffness with headache.  Patient denies any recent illness, fevers, chills, nausea or vomiting.  Patient denies any dysuria.  Patient states that his symptoms began 2 days ago and denies any alleviating or aggravating factors.  Patient states that he believes he "may have a kidney stone."  Patient states he has had a kidney stone in the past and states this may feel similar.  Patient denies any diaphoresis with his chest pain or shortness of breath.  Patient states symptoms are made worse by lying flat.  Patient has muscle relaxers prescribed for his shoulder and neck stiffness but has not taken any today.  Past Medical History:  Diagnosis Date  . Arthritis    "joints; from where I've had surgeries" (08/17/2013)  . Chest pain    a. 08/2013 Cardia CTA: Ca score 238 (95%), LM nl, LAD mod stenosis in D2 area, D1 mild ost stenosis, D2 no signif dzs, LCX small, RCA nl.  . Coronary artery disease    LHC (08/18/13):  pLAD 50, CFX and RCA normal.  EF 55-65%.  LAD lesion FFR:  0.88 (normal).  Med rx recommended.    [name] Dysphagia    Upper endoscopy 11/2013 - without obvious stricture s/p Maloney dilation.   [name] GERD (gastroesophageal reflux disease)   . Hyperlipidemia    a. Dx 3y ago - not on statin.  [name] Hypertension    a. Dx 3y ago.  [name] Hypothyroidism    a. on replacement.  . Obesity   . Sleep apnea   . Snoring   . Type II diabetes mellitus [facility])     Patient Active Problem List   Diagnosis  Date Noted  . Left lateral epicondylitis 03/18/2016  . GERD (gastroesophageal reflux disease) 02/01/2015  . 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 Dysphagia 09/30/2013  . Coronary Artery Disease 08/26/2013  . Diabetes mellitus type 2, uncontrolled ([place]) 08/10/2013  . Morbid obesity ([place]) 08/10/2013  . HTN (hypertension) 02/06/2011  . Hypothyroidism 02/06/2011  . Hyperlipidemia 02/06/2011    Past Surgical History:  Procedure Laterality Date  . ANKLE SURGERY Right    "had a growth in it; cut it out" (08/17/2013)  . CARDIAC CATHETERIZATION N/A 09/28/2015   Procedure: Left Heart Cath and Coronary Angiography;  Surgeon: Peter M Martinique, MD;  Location: [facility];  Service: Cardiovascular;  Laterality: N/A;  . CHOLECYSTECTOMY    . KNEE ARTHROSCOPY Right X 2  . LEFT HEART CATHETERIZATION WITH CORONARY ANGIOGRAM N/A 08/18/2013   Procedure: LEFT HEART CATHETERIZATION WITH CORONARY ANGIOGRAM;  Surgeon: Peter M Martinique, MD;  Location: [facility] CATH LAB;  Service: Cardiovascular;  Laterality: N/A;  . SHOULDER ARTHROSCOPY W/ ROTATOR CUFF REPAIR Left         Home Medications    Prior to Admission medications   Medication Sig Start Date End Date Taking? Authorizing Provider  acetaminophen (TYLENOL) 500 MG tablet Take 1,000 mg by mouth every 6 (six) hours as needed for  mild pain or moderate pain. Reported on 01/09/2016   Yes [provider]  aspirin EC 81 MG EC tablet Take 1 tablet (81 mg total) by mouth daily. 08/10/13  Yes Theora Gianotti, NP  diclofenac (VOLTAREN) 75 MG EC tablet Take 1 tablet (75 mg total) by mouth 2 (two) times daily. Patient taking differently: Take 75 mg by mouth 2 (two) times daily as needed for mild pain.  02/28/17  Yes Marin Olp, MD  glimepiride (AMARYL) 2 MG tablet Take 1 tablet (2 mg total) by mouth daily before breakfast. 06/27/17  Yes Inda Coke, PA  ibuprofen (ADVIL,MOTRIN) 200 MG tablet Take 400 mg by mouth every 6 (six) hours as  needed for mild pain or moderate pain. Reported on 01/09/2016   Yes [provider]  levothyroxine (SYNTHROID, LEVOTHROID) 100 MCG tablet TAKE 1 TABLET BY MOUTH ONCE DAILY 10/10/17  Yes Marin Olp, MD  losartan (COZAAR) 100 MG tablet TAKE ONE TABLET BY MOUTH ONCE DAILY 03/27/17  Yes Marin Olp, MD  metaxalone (SKELAXIN) 800 MG tablet Take 0.5-1 tablets (400-800 mg total) by mouth 3 (three) times daily as needed for muscle spasms. 08/05/17  Yes Marin Olp, MD  metFORMIN (GLUCOPHAGE) 1000 MG tablet TAKE ONE TABLET BY MOUTH TWICE DAILY WITH MEALS 02/26/17  Yes Marin Olp, MD  Multiple Vitamins-Minerals (MULTIVITAMIN,TX-MINERALS) tablet Take 1 tablet by mouth daily.     Yes [provider]  nitroGLYCERIN (NITROSTAT) 0.4 MG SL tablet Place 1 tablet (0.4 mg total) under the tongue every 5 (five) minutes as needed for chest pain (CP or SOB). 02/01/15  Yes Marin Olp, MD  pantoprazole (PROTONIX) 40 MG tablet TAKE 1 TABLET BY MOUTH ONCE DAILY 07/01/17  Yes Marin Olp, MD  simvastatin (ZOCOR) 40 MG tablet TAKE 1 TABLET BY MOUTH AT BEDTIME 07/30/17  Yes Marin Olp, MD  glucose blood (ONE TOUCH TEST STRIPS) test strip Up to 4 times daily.  Onetouch Verio.  E11.65 01/09/16   Marin Olp, MD  Insulin Pen Needle 31G X 8 MM MISC 1 each by Does not apply route daily. 01/09/16   Marin Olp, MD  Lancets [facility] ULTRASOFT) lancets Up to 4 times daily.  Onetouch Verio.  QVZ56.38 01/09/16   Marin Olp, MD  losartan (COZAAR) 100 MG tablet TAKE ONE TABLET BY MOUTH ONCE DAILY Patient not taking: Reported on 01/19/2018 10/10/17   Marin Olp, MD    Family History Family History  Adopted: Yes  Problem Relation Age of Onset  . Hypertension Mother   . Obesity Mother   . Diabetes Mother   . Coronary artery disease Father        s/p MI in his early 30's->CABG x 5 @ age 69, currently 47.  . Diabetes Father   . Stroke Father   . Hypertension  Father   . Diabetes Brother   . Obesity Brother   . Asthma Maternal Grandfather     Social History Social History   Tobacco Use  . Smoking status: Former Smoker    Packs/day: 1.50    Years: 24.00    Pack years: 36.00    Types: Cigarettes    Last attempt to quit: 09/10/2007    Years since quitting: 10.3  . Smokeless tobacco: Never Used  Substance Use Topics  . Alcohol use: No  . Drug use: No     Allergies   Patient has no known allergies.   Review of  Systems Review of Systems  Constitutional: Negative for chills and fever.  Respiratory: Negative for shortness of breath.   Cardiovascular: Positive for chest pain. Negative for palpitations and leg swelling.  Gastrointestinal: Positive for abdominal pain. Negative for abdominal distention, blood in stool, diarrhea, nausea and vomiting.  Genitourinary: Negative for dysuria.  All other systems reviewed and are negative.    Physical Exam Updated Vital Signs BP 119/77 (BP Location: Right Arm)   Pulse 69   Temp 98.4 F (36.9 C) (Oral)   Resp 12   SpO2 99%   Physical Exam  Constitutional: He appears well-developed and well-nourished.  HENT:  Head: Normocephalic and atraumatic.  Eyes: Conjunctivae are normal.  Neck: Neck supple.  Cardiovascular: Normal rate and regular rhythm.  No murmur heard. Pulmonary/Chest: Effort normal and breath sounds normal. No respiratory distress.  Abdominal: Soft. Bowel sounds are normal. He exhibits no distension. There is tenderness. There is no guarding.  Musculoskeletal: He exhibits no edema or tenderness.  Neurological: He is alert.  Skin: Skin is warm and dry.  Psychiatric: He has a normal mood and affect.  Nursing note and vitals reviewed.    ED Treatments / Results  Labs (all labs ordered are listed, but only abnormal results are displayed) Labs Reviewed  COMPREHENSIVE METABOLIC PANEL - Abnormal; Notable for the following components:      Result Value   Glucose, Bld 180  (*)    All other components within normal limits  URINALYSIS, ROUTINE W REFLEX MICROSCOPIC - Abnormal; Notable for the following components:   Color, Urine STRAW (*)    All other components within normal limits  CBC  LIPASE, BLOOD  I-STAT TROPONIN, ED  I-STAT TROPONIN, ED    EKG EKG Interpretation  Date/Time:  Monday Jan 19 2018 11:07:41 EDT Ventricular Rate:  85 PR Interval:  144 QRS Duration: 78 QT Interval:  366 QTC Calculation: 435 R Axis:   -16 Text Interpretation:  Normal sinus rhythm Normal ECG Confirmed by Davonna Belling [id number]) on 01/19/2018 4:31:58 PM   Radiology Ct Abdomen Pelvis Wo Contrast  Result Date: 01/19/2018 CLINICAL DATA:  Right-sided flank pain EXAM: CT ABDOMEN AND PELVIS WITHOUT CONTRAST TECHNIQUE: Multidetector CT imaging of the abdomen and pelvis was performed following the standard protocol without IV contrast. COMPARISON:  10/19/2012 FINDINGS: Lower chest: Lung bases are clear.  Heart size is normal Hepatobiliary: No focal hepatic abnormality. Status post cholecystectomy. Negative for biliary dilatation Pancreas: Unremarkable. No pancreatic ductal dilatation or surrounding inflammatory changes. Spleen: Normal in size without focal abnormality. Adrenals/Urinary Tract: Adrenal glands are unremarkable. Kidneys are normal, without renal calculi, focal lesion, or hydronephrosis. Bladder is unremarkable. Stomach/Bowel: Stomach is within normal limits. Appendix appears normal. No evidence of bowel wall thickening, distention, or inflammatory changes. Vascular/Lymphatic: Moderate aortic atherosclerosis. No aneurysmal dilatation. No significantly enlarged lymph nodes Reproductive: Prostate is unremarkable. Other: Fat in the left greater than right inguinal canals. No free fluid or free air. Small fat in the umbilical region. Musculoskeletal: No acute or significant osseous findings. IMPRESSION: 1. Negative for nephrolithiasis, hydronephrosis, or ureteral stone 2. No CT  evidence for acute intra-abdominal or pelvic abnormality Electronically Signed   By: Donavan Foil M.D.   On: 01/19/2018 18:49   Dg Chest 2 View  Result Date: 01/19/2018 CLINICAL DATA:  Chest pain EXAM: CHEST - 2 VIEW COMPARISON:  08/04/2015 FINDINGS: Normal heart size and mediastinal contours. No acute infiltrate or edema. No effusion or pneumothorax. No acute osseous findings. IMPRESSION: Negative chest. Electronically Signed  By: Monte Fantasia M.D.   On: 01/19/2018 12:43    Procedures Procedures (including critical care time)  Medications Ordered in ED Medications - No data to display   Initial Impression / Assessment and Plan / ED Course  I have reviewed the triage vital signs and the nursing notes.  Pertinent labs & imaging results that were available during my care of the patient were reviewed by me and considered in my medical decision making (see chart for details).     Physical exam shows mild tenderness about the right UPJ with mild flank tenderness.  CT abdomen and pelvis shows no abnormalities or evidence of nephrolithiasis.  Urinalysis shows no abnormality.  Remainder of laboratory work-up largely within normal limits without concerning findings.  Patient with negative troponin x2.  Patient instructed to follow-up with PCP and to take his muscle relaxers for his shoulder stiffness. Pt given appropriate f/u and return precautions. Pt voiced understanding and is agreeable to discharge at this time.    Final Clinical Impressions(s) / ED Diagnoses   Final diagnoses:  Flank pain  Chest pain, unspecified type    ED Discharge Orders    None       Chapman Moss, MD 01/19/18 [id number]    Davonna Belling, MD 01/19/18 2350

## 2018-01-19 NOTE — ED Triage Notes (Signed)
Pt here for multiple complaints. States he has been having migraine headache since Thursday after he felt something "pop" in his head. He sates he has had some right side abd pain, chest pain, back pain, and neck pain. Pt alert and oriented, no acute distress noted. VItals stable in triage.

## 2018-01-19 NOTE — ED Notes (Signed)
Pt stated that his head and neck is about a 6 on the pain scale. Pt stated that he feels pulls in his head and neck whenever he turns his head left or right. Pt that when he took his nitro the pain did not stop nor help a little. Pt stated that when taking the nitro the pain in his head is different then when he just take the nitro for his chest pain.

## 2018-01-21 ENCOUNTER — Other Ambulatory Visit: Payer: Self-pay | Admitting: Physician Assistant

## 2018-01-31 ENCOUNTER — Other Ambulatory Visit: Payer: Self-pay | Admitting: Family Medicine

## 2018-02-17 ENCOUNTER — Other Ambulatory Visit: Payer: Self-pay | Admitting: Family Medicine

## 2018-03-07 ENCOUNTER — Other Ambulatory Visit: Payer: Self-pay | Admitting: Family Medicine

## 2018-03-25 ENCOUNTER — Encounter: Payer: Self-pay | Admitting: Physician Assistant

## 2018-03-25 ENCOUNTER — Ambulatory Visit (INDEPENDENT_AMBULATORY_CARE_PROVIDER_SITE_OTHER): Payer: Self-pay

## 2018-03-25 ENCOUNTER — Ambulatory Visit: Payer: Self-pay | Admitting: Physician Assistant

## 2018-03-25 ENCOUNTER — Ambulatory Visit: Payer: Self-pay | Admitting: *Deleted

## 2018-03-25 VITALS — BP 110/64 | HR 78 | Temp 98.4°F | Ht 69.0 in | Wt 315.4 lb

## 2018-03-25 DIAGNOSIS — E119 Type 2 diabetes mellitus without complications: Secondary | ICD-10-CM

## 2018-03-25 DIAGNOSIS — R1030 Lower abdominal pain, unspecified: Secondary | ICD-10-CM

## 2018-03-25 DIAGNOSIS — W57XXXA Bitten or stung by nonvenomous insect and other nonvenomous arthropods, initial encounter: Secondary | ICD-10-CM

## 2018-03-25 DIAGNOSIS — S70361A Insect bite (nonvenomous), right thigh, initial encounter: Secondary | ICD-10-CM

## 2018-03-25 DIAGNOSIS — E039 Hypothyroidism, unspecified: Secondary | ICD-10-CM

## 2018-03-25 LAB — CBC
HCT: 39.6 % (ref 39.0–52.0)
HEMOGLOBIN: 13.3 g/dL (ref 13.0–17.0)
MCHC: 33.5 g/dL (ref 30.0–36.0)
MCV: 86.1 fl (ref 78.0–100.0)
PLATELETS: 220 10*3/uL (ref 150.0–400.0)
RBC: 4.6 Mil/uL (ref 4.22–5.81)
RDW: 13.6 % (ref 11.5–15.5)
WBC: 6.1 10*3/uL (ref 4.0–10.5)

## 2018-03-25 LAB — COMPREHENSIVE METABOLIC PANEL
ALBUMIN: 4.3 g/dL (ref 3.5–5.2)
ALT: 59 U/L — ABNORMAL HIGH (ref 0–53)
AST: 37 U/L (ref 0–37)
Alkaline Phosphatase: 71 U/L (ref 39–117)
BUN: 11 mg/dL (ref 6–23)
CALCIUM: 9.1 mg/dL (ref 8.4–10.5)
CHLORIDE: 103 meq/L (ref 96–112)
CO2: 28 meq/L (ref 19–32)
Creatinine, Ser: 1.09 mg/dL (ref 0.40–1.50)
GFR: 75.83 mL/min (ref >60.00)
Glucose, Bld: 173 mg/dL — ABNORMAL HIGH (ref 70–99)
POTASSIUM: 3.7 meq/L (ref 3.5–5.1)
Sodium: 139 mEq/L (ref 135–145)
Total Bilirubin: 1.1 mg/dL (ref 0.2–1.2)
Total Protein: 6.7 g/dL (ref 6.0–8.3)

## 2018-03-25 LAB — POCT GLYCOSYLATED HEMOGLOBIN (HGB A1C): HEMOGLOBIN A1C: 6.7 % — AB (ref 4.0–5.6)

## 2018-03-25 LAB — TSH: TSH: 3.3 u[IU]/mL (ref 0.35–4.50)

## 2018-03-25 MED ORDER — GLIMEPIRIDE 4 MG PO TABS: 4.0000 mg | ORAL_TABLET | Freq: Every day | ORAL | 3 refills | Status: DC

## 2018-03-25 MED ORDER — DOXYCYCLINE HYCLATE 100 MG PO TABS: 100.0000 mg | ORAL_TABLET | Freq: Two times a day (BID) | ORAL | 0 refills | Status: DC

## 2018-03-25 NOTE — Telephone Encounter (Signed)
Pt reports abdominal pain 2/10, intermittent x 2 weeks. States cramping, below umbilicus, radiates to right side at times; denies dysuria.  Also reports discomfort epigastric area last night. States took nitroglycerin which did not help; took an antacid which did resolve pain. Seen in ED 01/19/18 for similar symptoms. States has been [employer]" and reports urgency with bowels, loose x 2 weeks. Also has concerns re: BS; states 250 yesterday but had "eaten pancakes". States down to 74 after working out at gym. This AM 103. Denies any SOB, dizziness, nausea, diaphoresis. Appt made for today with S. Worley. Care advise given per protocol. Reason for Disposition . [1] MODERATE pain (e.g., interferes with normal activities) AND [2] pain comes and goes (cramps) AND [3] present > 24 hours  (Exception: pain with Vomiting or Diarrhea - see that Guideline)  Answer Assessment - Initial Assessment Questions 1. LOCATION: "Where does it hurt?"      Below naval, also epigastric area at times 2. RADIATION: "Does the pain shoot anywhere else?" (e.g., chest, back)     Radiates to right side at times 3. ONSET: "When did the pain begin?" (Minutes, hours or days ago)      2 weeks ago 4. SUDDEN: "Gradual or sudden onset?"     gradual 5. PATTERN "Does the pain come and go, or is it constant?"    - If constant: "Is it getting better, staying the same, or worsening?"      (Note: Constant means the pain never goes away completely; most serious pain is constant and it progresses)     - If intermittent: "How long does it last?" "Do you have pain now?"     (Note: Intermittent means the pain goes away completely between bouts)     Intermittent 6. SEVERITY: "How bad is the pain?"  (e.g., Scale 1-10; mild, moderate, or severe)    - MILD (1-3): doesn't interfere with normal activities, abdomen soft and not tender to touch     - MODERATE (4-7): interferes with normal activities or awakens from sleep, tender to touch     - SEVERE  (8-10): excruciating pain, doubled over, unable to do any normal activities       Abdominal "shooting, cramping pain"  2/10 epigastric area 7. RECURRENT SYMPTOM: "Have you ever had this type of abdominal pain before?" If so, ask: "When was the last time?" and "What happened that time?"      Yes, "Went to ED,given something that coated my stomach." 8. CAUSE: "What do you think is causing the abdominal pain?"     Unsure 9. RELIEVING/AGGRAVATING FACTORS: "What makes it better or worse?" (e.g., movement, antacids, bowel movement)     Antacids helped epigastric pain 10. OTHER SYMPTOMS: "Has there been any vomiting, diarrhea, constipation, or urine problems?"      Urgency with bowels, loose at times, cramping at times  Protocols used: ABDOMINAL PAIN - MALE-A-AH

## 2018-03-25 NOTE — Assessment & Plan Note (Signed)
Update TSH today.  Will adjust levothyroxine if needed.  Follow-up with PCP in 3 months.

## 2018-03-25 NOTE — Patient Instructions (Addendum)
It was great to see you.  Work on better bowel habits --> refer to sheet that I have provided to start a bowel regimen with colace   And MiraLAX if needed.  Start doxycycline for your insect bite.  Increase Amaryl to 4 mg.  Follow-up if symptoms worsen or do not improve despite treatment.  I strongly recommend that you follow-up with Dr. Yong Channel in 3 months for your blood sugars.    Check your blood sugar twice a day.  Vary the time of day when you check, between before the 3 meals, and at bedtime.    Also check if you have symptoms of your blood sugar being too high or too low.  Please keep a record of the readings and bring it to your next appointment here.  You can write it on any piece of paper.    Please call us sooner if your blood sugar goes below 70, or if you have a lot of readings over 200.

## 2018-03-25 NOTE — Progress Notes (Signed)
Gabriel [name] is a 52 y.o. male here for a recurrence of a previously resolved problem.  History of Present Illness:   Chief Complaint  Patient presents with  . Abdominal Pain  . Insect Bite     Abdominal Pain   This is a recurrent problem. Episode onset: Started 2 weeks ago   The onset quality is gradual.  The problem occurs every several days. The most recent episode lasted 5 hours.  The problem has been unchanged.  The pain is located in the epigastric region, periumbilical region and RLQ (Cramping RLQ). The pain is at a severity of 3/10. The pain is mild. The quality of the pain is cramping, aching, dull and a sensation of fullness. The abdominal pain radiates to the RLQ. Associated symptoms include belching and diarrhea. Pertinent negatives include no hematuria, nausea or vomiting. The pain is aggravated by eating. The pain is relieved by sitting up. He has tried antacids for the symptoms. The treatment provided mild relief. His past medical history is significant for GERD and irritable bowel syndrome. There is no history of gallstones.    Certain foods cause triggers for him -- especially pastas, carbohydrates. He has been trying to limit his carbohydrates recently. Drinks mostly water throughout the day. Will have some GERD when sitting in his recliner. Having Type 5 Stool on the [facility] Stool Chart.  He did have a colonoscopy in 2000, was having symptoms that were similar to IBS.  His colonoscopy was essentially normal without evidence for symptoms and was determined to have IBS at that time.  He has not had a screening colonoscopy, as he is currently without insurance.  He is going to the gym about every few days to help with his blood sugars, goes for at least 45 minutes per time and does aerobic activity while there.  He does not have any worsening GI symptoms when he does this.  Denies chest pain, shortness of breath.  Wt Readings from Last 6 Encounters:  03/25/18 (!) 315 lb 6.1 oz  (143.1 kg)  08/05/17 (!) 321 lb 9.6 oz (145.9 kg)  06/27/17 (!) 319 lb 8 oz (144.9 kg)  03/27/17 (!) 302 lb (137 kg)  02/28/17 (!) 305 lb 9.6 oz (138.6 kg)  10/22/16 (!) 302 lb 9.6 oz (137.3 kg)    Insect bite Pt has a bite from week ago, it is on side of inner right knee. Area is red and itchy. Pt using Neosporin with relief. Pt not sure if tick or insect.  He reports that he has had purulent discharge from this insect bite for a few days.  He denies fevers, chills, worsening rash.  Diabetes Blood sugar was 103 this morning. HgbA1c is 6.7, was 6.2 at last check about 9 months.  He is currently on metformin 1000 mg twice daily and Amaryl 2 mg daily.  He is tolerating his regimen well.  He is currently self-pay.  Denies polyphagia or polyuria.  Hypothyroidism He has a history of hypothyroidism, he is currently on levothyroxine 100 mcg.  His thyroid has not been checked in over one year.  He has occasional constipation, and difficulty with losing weight.  He reports he is compliant with his medication.   Past Medical History:  Diagnosis Date  . Arthritis    "joints; from where I've had surgeries" (08/17/2013)  . Chest pain    a. 08/2013 Cardia CTA: Ca score 238 (95%), LM nl, LAD mod stenosis in D2 area, D1 mild ost stenosis,  D2 no signif dzs, LCX small, RCA nl.  . Coronary artery disease    LHC (08/18/13):  pLAD 50, CFX and RCA normal.  EF 55-65%.  LAD lesion FFR:  0.88 (normal).  Med rx recommended.    [name] Dysphagia    Upper endoscopy 11/2013 - without obvious stricture s/p Maloney dilation.   [name] GERD (gastroesophageal reflux disease)   . Hyperlipidemia    a. Dx 3y ago - not on statin.  [name] Hypertension    a. Dx 3y ago.  [name] Hypothyroidism    a. on replacement.  . IBS (irritable bowel syndrome)    colonoscopy 2000  . Obesity   . Sleep apnea   . Snoring   . Type II diabetes mellitus ([place])      Social History   Socioeconomic History  . Marital status: Single    Spouse name: Not on  file  . Number of children: 0  . Years of education: Not on file  . Highest education level: Not on file  Occupational History  . Occupation: Unemployed  Social Needs  . Financial resource strain: Not on file  . Food insecurity:    Worry: Not on file    Inability: Not on file  . Transportation needs:    Medical: Not on file    Non-medical: Not on file  Tobacco Use  . Smoking status: Former Smoker    Packs/day: 1.50    Years: 24.00    Pack years: 36.00    Types: Cigarettes    Last attempt to quit: 09/10/2007    Years since quitting: 10.5  . Smokeless tobacco: Never Used  Substance and Sexual Activity  . Alcohol use: No  . Drug use: No  . Sexual activity: Yes  Lifestyle  . Physical activity:    Days per week: Not on file    Minutes per session: Not on file  . Stress: Not on file  Relationships  . Social connections:    Talks on phone: Not on file    Gets together: Not on file    Attends religious service: Not on file    Active member of club or organization: Not on file    Attends meetings of clubs or organizations: Not on file    Relationship status: Not on file  . Intimate partner violence:    Fear of current or ex partner: Not on file    Emotionally abused: Not on file    Physically abused: Not on file    Forced sexual activity: Not on file  Other Topics Concern  . 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  Never married. Unemployed.     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 ANKLE SURGERY Right    "had a growth in it; cut it out" (08/17/2013)  . CARDIAC CATHETERIZATION N/A 09/28/2015   Procedure: Left Heart Cath and Coronary Angiography;  Surgeon: Peter M Martinique, MD;  Location: [facility];  Service: Cardiovascular;  Laterality: N/A;  . CHOLECYSTECTOMY    . KNEE ARTHROSCOPY Right X 2  . LEFT HEART  CATHETERIZATION WITH CORONARY ANGIOGRAM N/A 08/18/2013   Procedure: LEFT HEART CATHETERIZATION WITH CORONARY ANGIOGRAM;  Surgeon: Peter M Martinique, MD;  Location: [facility] C[facility] LAB;  Service: Cardiovascular;  Laterality: N/A;  . SHOULDER ARTHROSCOPY W/ ROTATOR CUFF REPAIR Left  Family History  Adopted: Yes  Problem Relation Age of Onset  . Hypertension Mother   . Obesity Mother   . Diabetes Mother   . Coronary artery disease Father        s/p MI in his early 30's->CABG x 5 @ age 28, currently 93.  . Diabetes Father   . Stroke Father   . Hypertension Father   . Diabetes Brother   . Obesity Brother   . Asthma Maternal Grandfather     No Known Allergies  Current Medications:   Current Outpatient Medications:  .  acetaminophen (TYLENOL) 500 MG tablet, Take 1,000 mg by mouth every 6 (six) hours as needed for mild pain or moderate pain. Reported on 01/09/2016, Disp: , Rfl:  .  aspirin EC 81 MG EC tablet, Take 1 tablet (81 mg total) by mouth daily., Disp: , Rfl:  .  diclofenac (VOLTAREN) 75 MG EC tablet, Take 1 tablet (75 mg total) by mouth 2 (two) times daily. (Patient taking differently: Take 75 mg by mouth 2 (two) times daily as needed for mild pain. ), Disp: 20 tablet, Rfl: 0 .  glucose blood (ONE TOUCH TEST STRIPS) test strip, Up to 4 times daily.  Onetouch Verio.  E11.65, Disp: 100 each, Rfl: 12 .  ibuprofen (ADVIL,MOTRIN) 200 MG tablet, Take 400 mg by mouth every 6 (six) hours as needed for mild pain or moderate pain. Reported on 01/09/2016, Disp: , Rfl:  .  Insulin Pen Needle 31G X 8 MM MISC, 1 each by Does not apply route daily., Disp: 100 each, Rfl: 3 .  Lancets (ONETOUCH ULTRASOFT) lancets, Up to 4 times daily.  Onetouch Verio.  DxE11.65, Disp: 100 each, Rfl: 12 .  levothyroxine (SYNTHROID, LEVOTHROID) 100 MCG tablet, TAKE 1 TABLET BY MOUTH ONCE DAILY, Disp: 90 tablet, Rfl: 1 .  losartan (COZAAR) 100 MG tablet, TAKE ONE TABLET BY MOUTH ONCE DAILY, Disp: 90 tablet, Rfl: 1 .  losartan  (COZAAR) 100 MG tablet, TAKE ONE TABLET BY MOUTH ONCE DAILY, Disp: 90 tablet, Rfl: 1 .  metaxalone (SKELAXIN) 800 MG tablet, Take 0.5-1 tablets (400-800 mg total) by mouth 3 (three) times daily as needed for muscle spasms., Disp: 20 tablet, Rfl: 0 .  metFORMIN (GLUCOPHAGE) 1000 MG tablet, TAKE 1 TABLET BY MOUTH TWICE DAILY WITH MEALS, Disp: 180 tablet, Rfl: 3 .  Multiple Vitamins-Minerals (MULTIVITAMIN,TX-MINERALS) tablet, Take 1 tablet by mouth daily.  , Disp: , Rfl:  .  nitroGLYCERIN (NITROSTAT) 0.4 MG SL tablet, Place 1 tablet (0.4 mg total) under the tongue every 5 (five) minutes as needed for chest pain (CP or SOB)., Disp: 25 tablet, Rfl: 3 .  pantoprazole (PROTONIX) 40 MG tablet, TAKE 1 TABLET BY MOUTH ONCE DAILY, Disp: 90 tablet, Rfl: 1 .  simvastatin (ZOCOR) 40 MG tablet, TAKE 1 TABLET BY MOUTH AT BEDTIME, Disp: 30 tablet, Rfl: 5 .  doxycycline (VIBRA-TABS) 100 MG tablet, Take 1 tablet (100 mg total) by mouth 2 (two) times daily., Disp: 20 tablet, Rfl: 0 .  glimepiride (AMARYL) 4 MG tablet, Take 1 tablet (4 mg total) by mouth daily before breakfast., Disp: 30 tablet, Rfl: 3   Review of Systems:   Review of Systems  Gastrointestinal: Positive for abdominal pain and diarrhea. Negative for nausea and vomiting.  Genitourinary: Negative for hematuria.    Vitals:   Vitals:   03/25/18 1347  BP: 110/64  Pulse: 78  Temp: 98.4 F (36.9 C)  TempSrc: Oral  SpO2: 96%  Weight: (!) 315  lb 6.1 oz (143.1 kg)  Height: 5\' 9"  (1.753 m)     Body mass index is 46.57 kg/m.  Physical Exam:   Physical Exam  Constitutional: He appears well-developed. He is cooperative.  Non-toxic appearance. He does not have a sickly appearance. He does not appear ill. No distress.  Cardiovascular: Normal rate, regular rhythm, S1 normal, S2 normal, normal heart sounds and normal pulses.  No LE edema  Pulmonary/Chest: Effort normal and breath sounds normal.  Abdominal: Normal appearance and bowel sounds are  normal. There is generalized tenderness. There is no rigidity, no rebound, no guarding, no CVA tenderness, no tenderness at McBurney's point and negative Murphy's sign.  Neurological: He is alert. GCS eye subscore is 4. GCS verbal subscore is 5. GCS motor subscore is 6.  Skin: Skin is warm, dry and intact.  Erythematous area of skin approximately the size of a quarter in inner R leg near knee. No streaking or discharge present. No warmth.  Psychiatric: He has a normal mood and affect. His speech is normal and behavior is normal.  Nursing note and vitals reviewed.  Results for orders placed or performed in visit on 03/25/18  POCT glycosylated hemoglobin (Hb A1C)  Result Value Ref Range   Hemoglobin A1C 6.7 (A) 4.0 - 5.6 %   HbA1c POC (<> result, manual entry)  4.0 - 5.6 %   HbA1c, POC (prediabetic range)  5.7 - 6.4 %   HbA1c, POC (controlled diabetic range)  0.0 - 7.0 %     Assessment and Plan:    Problem List Items Addressed This Visit      Endocrine   Hypothyroidism    Update TSH today.  Will adjust levothyroxine if needed.  Follow-up with PCP in 3 months.      Relevant Orders   TSH   Diabetes mellitus without complication (HCC)    Hemoglobin A1c is worse today.  Continue metformin 2000 mg daily.  Will increase Amaryl to 4 mg today, he has been on this dosage before.  We discussed need to monitor low blood sugar symptoms.  And to call us if he has consistent blood sugars that are greater than 200 or less than 70.  Follow-up with Dr. Yong Channel in 3 months.      Relevant Medications   glimepiride (AMARYL) 4 MG tablet   Other Relevant Orders   POCT glycosylated hemoglobin (Hb A1C) (Completed)   Comprehensive metabolic panel    Other Visit Diagnoses    Insect bite Although it does not appear infected today, he did have purulent discharge from it for quite some time, and now that his diabetes is worsening, I am going to start doxycycline at this time.  Follow-up if any worsening  symptoms.   Lower abdominal pain    -  Primary No red flags on exam.  Patient is able to do normal activities including exercise without worsening of symptoms.  Discussed need for healthy bowel regimen, provided education on this today.  Official x-ray radiology read is pending, does show some stool in colon.  I discussed with him that if his symptoms worsen to seek medical attention.   Relevant Orders   Comprehensive metabolic panel   DG Abd 2 Views   CBC       . Reviewed expectations re: course of current medical issues. . Discussed self-management of symptoms. . Outlined signs and symptoms indicating need for more acute intervention. . Patient verbalized understanding and all questions were answered. . See orders  for this visit as documented in the electronic medical record. . Patient received an After-Visit Summary.  CMA or LPN served as scribe during this visit. History, Physical, and Plan performed by medical provider. Documentation and orders reviewed and attested to.   Inda Coke, PA-C

## 2018-03-25 NOTE — Assessment & Plan Note (Signed)
Hemoglobin A1c is worse today.  Continue metformin 2000 mg daily.  Will increase Amaryl to 4 mg today, he has been on this dosage before.  We discussed need to monitor low blood sugar symptoms.  And to call us if he has consistent blood sugars that are greater than 200 or less than 70.  Follow-up with Dr. Yong Channel in 3 months.

## 2018-04-21 ENCOUNTER — Other Ambulatory Visit: Payer: Self-pay | Admitting: Family Medicine

## 2018-04-30 ENCOUNTER — Other Ambulatory Visit: Payer: Self-pay | Admitting: Family Medicine

## 2018-06-25 ENCOUNTER — Encounter: Payer: Self-pay | Admitting: Family Medicine

## 2018-06-25 ENCOUNTER — Ambulatory Visit: Payer: Self-pay | Admitting: Family Medicine

## 2018-06-25 VITALS — BP 128/80 | HR 82 | Temp 98.3°F | Resp 16 | Wt 313.0 lb

## 2018-06-25 DIAGNOSIS — Z79899 Other long term (current) drug therapy: Secondary | ICD-10-CM

## 2018-06-25 DIAGNOSIS — E785 Hyperlipidemia, unspecified: Secondary | ICD-10-CM

## 2018-06-25 DIAGNOSIS — E119 Type 2 diabetes mellitus without complications: Secondary | ICD-10-CM

## 2018-06-25 DIAGNOSIS — I10 Essential (primary) hypertension: Secondary | ICD-10-CM

## 2018-06-25 DIAGNOSIS — E039 Hypothyroidism, unspecified: Secondary | ICD-10-CM

## 2018-06-25 DIAGNOSIS — Z1211 Encounter for screening for malignant neoplasm of colon: Secondary | ICD-10-CM

## 2018-06-25 NOTE — Assessment & Plan Note (Signed)
Weight down 2 pounds from last visit.  Needs to continue efforts for healthy eating and regular exercise.  Encouraged patient to continue on this path of weight loss

## 2018-06-25 NOTE — Assessment & Plan Note (Signed)
S: unknown controlled on simvastatin 40mg - no recent checks A/P: need to update lipids, doesn't have insurance but as this has been continued issue- will need to go ahead and update. Target LDL under 70

## 2018-06-25 NOTE — Assessment & Plan Note (Signed)
S:  controlled on last check on metformin 1 g twice daily.  Previously had been on Toujeo with poor diet but was able to come off with improved diet and exercise.  We added glimepiride 2 mg then further titrated to 4 mg last year.  Fortunately A1c was controlled on last check-when Samantha saw him for abdominal pain  CBGs- 109 this AM Exercise and diet- has lost another 2 pounds since last visit. Has cut down on sodas and breads Lab Results  Component Value Date   HGBA1C 6.7 (A) 03/25/2018   HGBA1C 6.2 06/27/2017   HGBA1C 8.3 (H) 02/28/2017  A/P: update a1c, hoping remains below 7, below 6 would consider reducing glimepiride or if had any lows

## 2018-06-25 NOTE — Assessment & Plan Note (Signed)
S: controlled on losartan 100mg  BP Readings from Last 3 Encounters:  06/25/18 128/80  03/25/18 110/64  01/19/18 119/77  A/P: We discussed blood pressure goal of <140/90. Continue current meds

## 2018-06-25 NOTE — Patient Instructions (Addendum)
Sign release of information at the check out desk for last diabetic eye exam  Schedule a lab visit at the check out desk within 2 weeks. Return for future fasting labs meaning nothing but water after midnight please. Ok to take your medications with water.   We will call you within two weeks about your referral to GI for colonscopy. If you do not hear within 3 weeks, give [place] a call.   Pick up stool cards from lab as this is cheapest of 3 options  Please stop by lab before you go

## 2018-06-25 NOTE — Assessment & Plan Note (Signed)
S: On thyroid medication-100 mcg  Lab Results  Component Value Date   TSH 3.30 03/25/2018   A/P: controlled- continue current rx

## 2018-06-25 NOTE — Progress Notes (Signed)
Subjective:  Gabriel [name] is a 52 y.o. year old very pleasant male patient who presents for/with See problem oriented charting ROS-no chest pain or shortness of breath reported.  No hypoglycemia.  No increased edema.  Various joint aches at times  Past Medical History-  Patient Active Problem List   Diagnosis Date Noted  . Coronary Artery Disease 08/26/2013    Priority: High  . Diabetes mellitus without complication ([place]) 59/16/3846    Priority: High  . 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 Morbid obesity ([place]) 08/10/2013    Priority: Medium  . HTN (hypertension) 02/06/2011    Priority: Medium  . Hypothyroidism 02/06/2011    Priority: Medium  . Hyperlipidemia 02/06/2011    Priority: Medium  . GERD (gastroesophageal reflux disease) 02/01/2015    Priority: Low  . Dysphagia 09/30/2013    Priority: Low  . Left lateral epicondylitis 03/18/2016    Medications- reviewed and updated Current Outpatient Medications  Medication Sig Dispense Refill  . acetaminophen (TYLENOL) 500 MG tablet Take 1,000 mg by mouth every 6 (six) hours as needed for mild pain or moderate pain. Reported on 01/09/2016    . aspirin EC 81 MG EC tablet Take 1 tablet (81 mg total) by mouth daily.    [name] glimepiride (AMARYL) 4 MG tablet Take 1 tablet (4 mg total) by mouth daily before breakfast. 30 tablet 3  . glucose blood (ONE TOUCH TEST STRIPS) test strip Up to 4 times daily.  Onetouch Verio.  E11.65 100 each 12  . Lancets (ONETOUCH ULTRASOFT) lancets Up to 4 times daily.  Onetouch Verio.  DxE11.65 100 each 12  . levothyroxine (SYNTHROID, LEVOTHROID) 100 MCG tablet TAKE 1 TABLET BY MOUTH ONCE DAILY 90 tablet 1  . losartan (COZAAR) 100 MG tablet TAKE ONE TABLET BY MOUTH ONCE DAILY 90 tablet 1  . metFORMIN (GLUCOPHAGE) 1000 MG tablet TAKE 1 TABLET BY MOUTH TWICE DAILY WITH MEALS 180 tablet 3  . Multiple Vitamins-Minerals (MULTIVITAMIN,TX-MINERALS) tablet Take 1 tablet by mouth daily.      .  nitroGLYCERIN (NITROSTAT) 0.4 MG SL tablet Place 1 tablet (0.4 mg total) under the tongue every 5 (five) minutes as needed for chest pain (CP or SOB). 25 tablet 3  . pantoprazole (PROTONIX) 40 MG tablet TAKE 1 TABLET BY MOUTH ONCE DAILY 90 tablet 1  . simvastatin (ZOCOR) 40 MG tablet TAKE 1 TABLET BY MOUTH AT BEDTIME 30 tablet 2   No current facility-administered medications for this visit.     Objective: BP 128/80   Pulse 82   Temp 98.3 F (36.8 C) (Oral)   Resp 16   Wt (!) 313 lb (142 kg)   SpO2 96%   BMI 46.22 kg/m  Gen: NAD, resting comfortably CV: RRR no murmurs rubs or gallops Lungs: CTAB no crackles, wheeze, rhonchi Abdomen: soft/nontender/nondistended/normal bowel sounds.  Morbid obesity Ext: no edema Skin: warm, dry  Assessment/Plan:  Other notes: 1.  Pneumovax 23 today- declined along with flu 2.  No insurance- cannot afford colonoscopy or Cologuard.  Will try stool cards  Hypothyroidism S: On thyroid medication-100 mcg  Lab Results  Component Value Date   TSH 3.30 03/25/2018   A/P: controlled- continue current rx   HTN (hypertension) S: controlled on losartan 100mg  BP Readings from Last 3 Encounters:  06/25/18 128/80  03/25/18 110/64  01/19/18 119/77  A/P: We discussed blood pressure goal of <140/90. Continue current meds  Hyperlipidemia S: unknown controlled on simvastatin 40mg -  no recent checks A/P: need to update lipids, doesn't have insurance but as this has been continued issue- will need to go ahead and update. Target LDL under 70  Diabetes mellitus without complication (HCC) S:  controlled on last check on metformin 1 g twice daily.  Previously had been on Toujeo with poor diet but was able to come off with improved diet and exercise.  We added glimepiride 2 mg then further titrated to 4 mg last year.  Fortunately A1c was controlled on last check-when Samantha saw him for abdominal pain  CBGs- 109 this AM Exercise and diet- has lost another 2  pounds since last visit. Has cut down on sodas and breads Lab Results  Component Value Date   HGBA1C 6.7 (A) 03/25/2018   HGBA1C 6.2 06/27/2017   HGBA1C 8.3 (H) 02/28/2017  A/P: update a1c, hoping remains below 7, below 6 would consider reducing glimepiride or if had any lows   Morbid obesity (HCC) Weight down 2 pounds from last visit.  Needs to continue efforts for healthy eating and regular exercise.  Encouraged patient to continue on this path of weight loss   Future Appointments  Date Time Provider [place]  07/06/2018 11:00 AM [facility]   Lab/Order associations: Screen for colon cancer - Plan: Fecal occult blood, imunochemical, CANCELED: Ambulatory referral to Gastroenterology  Diabetes mellitus without complication ([place]) - Plan: CBC, Comprehensive metabolic panel, Lipid panel, Hemoglobin A1c  High risk medication use - Plan: Vitamin B12  Hypothyroidism, unspecified type  Essential hypertension  Hyperlipidemia, unspecified hyperlipidemia type  Morbid obesity ([place])  Return precautions advised.  Garret Reddish, MD

## 2018-07-06 ENCOUNTER — Other Ambulatory Visit (INDEPENDENT_AMBULATORY_CARE_PROVIDER_SITE_OTHER): Payer: No Typology Code available for payment source

## 2018-07-06 DIAGNOSIS — Z79899 Other long term (current) drug therapy: Secondary | ICD-10-CM

## 2018-07-06 DIAGNOSIS — E119 Type 2 diabetes mellitus without complications: Secondary | ICD-10-CM

## 2018-07-06 LAB — COMPREHENSIVE METABOLIC PANEL
ALT: 44 U/L (ref 0–53)
AST: 27 U/L (ref 0–37)
Albumin: 4.5 g/dL (ref 3.5–5.2)
Alkaline Phosphatase: 76 U/L (ref 39–117)
BUN: 17 mg/dL (ref 6–23)
CHLORIDE: 102 meq/L (ref 96–112)
CO2: 29 mEq/L (ref 19–32)
Calcium: 9.4 mg/dL (ref 8.4–10.5)
Creatinine, Ser: 1.09 mg/dL (ref 0.40–1.50)
GFR: 75.74 mL/min (ref >60.00)
Glucose, Bld: 131 mg/dL — ABNORMAL HIGH (ref 70–99)
POTASSIUM: 4.4 meq/L (ref 3.5–5.1)
Sodium: 140 mEq/L (ref 135–145)
Total Bilirubin: 1.1 mg/dL (ref 0.2–1.2)
Total Protein: 6.8 g/dL (ref 6.0–8.3)

## 2018-07-06 LAB — LIPID PANEL
CHOLESTEROL: 129 mg/dL (ref 0–200)
HDL: 36.1 mg/dL — ABNORMAL LOW (ref >39.00)
LDL CALC: 65 mg/dL (ref 0–99)
NONHDL: 93.36
TRIGLYCERIDES: 140 mg/dL (ref 0.0–149.0)
Total CHOL/HDL Ratio: 4
VLDL: 28 mg/dL (ref 0.0–40.0)

## 2018-07-06 LAB — CBC
HCT: 41.9 % (ref 39.0–52.0)
HEMOGLOBIN: 14.2 g/dL (ref 13.0–17.0)
MCHC: 33.8 g/dL (ref 30.0–36.0)
MCV: 85.9 fl (ref 78.0–100.0)
PLATELETS: 225 10*3/uL (ref 150.0–400.0)
RBC: 4.88 Mil/uL (ref 4.22–5.81)
RDW: 13.4 % (ref 11.5–15.5)
WBC: 5.8 10*3/uL (ref 4.0–10.5)

## 2018-07-06 LAB — VITAMIN B12: VITAMIN B 12: 270 pg/mL (ref 211–911)

## 2018-07-06 LAB — HEMOGLOBIN A1C: Hgb A1c MFr Bld: 6.7 % — ABNORMAL HIGH (ref 4.6–6.5)

## 2018-07-28 ENCOUNTER — Other Ambulatory Visit: Payer: Self-pay | Admitting: Family Medicine

## 2018-07-31 ENCOUNTER — Telehealth: Payer: Self-pay

## 2018-07-31 NOTE — Telephone Encounter (Signed)
Called patient and asked left a message asking him to return my call

## 2018-07-31 NOTE — Telephone Encounter (Signed)
-----   Message from Marin Olp, MD sent at 07/31/2018  8:08 AM EST ----- Please call patient to encourage him to complete stool cards.  Gabriel [name] ----- Message ----- From: SYSTEM Sent: 07/31/2018  12:08 AM EST To: Marin Olp, MD

## 2018-08-11 ENCOUNTER — Other Ambulatory Visit: Payer: Self-pay | Admitting: Family Medicine

## 2018-08-11 ENCOUNTER — Ambulatory Visit: Payer: Self-pay | Admitting: *Deleted

## 2018-08-11 NOTE — Telephone Encounter (Signed)
Yes thanks- agree with disposation

## 2018-08-11 NOTE — Telephone Encounter (Signed)
Patient went to bed and woke at 2am with chest pain and abdominal pain-  - he took nitroglycerin- didn't help a lot- aches in back and stomach. Feels like most of pain is in abdomin and sternum. Feels very nauseous. Patient has not improved during the day- patient has not tried any of his GERD medications- but with his history and present symptoms- can not rule out heart issue.Patient states he just doesn't feel good. Sent to ED for evaluation.  Reason for Disposition . [1] Chest pain lasts > 5 minutes AND [2] not relieved with nitroglycerin    Patient declines 911- but he is going to go to ED to get checked.  Answer Assessment - Initial Assessment Questions 1. LOCATION: "Where does it hurt?"       midsternum and R side and in shoulder area 2. RADIATION: "Does the pain go anywhere else?" (e.g., into neck, jaw, arms, back)     Back pain for several days 3. ONSET: "When did the chest pain begin?" (Minutes, hours or days)      2 am this morning- nitroglycerin- 3:30-4- didn't really help 4. PATTERN "Does the pain come and go, or has it been constant since it started?"  "Does it get worse with exertion?"      Constant- does not get worse with exertion 5. DURATION: "How long does it last" (e.g., seconds, minutes, hours)     Has lasted al day 6. SEVERITY: "How bad is the pain?"  (e.g., Scale 1-10; mild, moderate, or severe)    - MILD (1-3): doesn't interfere with normal activities     - MODERATE (4-7): interferes with normal activities or awakens from sleep    - SEVERE (8-10): excruciating pain, unable to do any normal activities       2- no energy 7. CARDIAC RISK FACTORS: "Do you have any history of heart problems or risk factors for heart disease?" (e.g., prior heart attack, angina; high blood pressure, diabetes, being overweight, high cholesterol, smoking, or strong family history of heart disease)     Heart disease,diabetes, overweight 8. PULMONARY RISK FACTORS: "Do you have any history of lung  disease?"  (e.g., blood clots in lung, asthma, emphysema, birth control pills)     no 9. CAUSE: "What do you think is causing the chest pain?"     Unknown 10. OTHER SYMPTOMS: "Do you have any other symptoms?" (e.g., dizziness, nausea, vomiting, sweating, fever, difficulty breathing, cough)       Difficulty breathing 11. PREGNANCY: "Is there any chance you are pregnant?" "When was your last menstrual period?"       n/a  Protocols used: CHEST PAIN-A-AH

## 2018-08-11 NOTE — Telephone Encounter (Signed)
noted 

## 2018-08-11 NOTE — Telephone Encounter (Signed)
See note

## 2018-08-12 ENCOUNTER — Other Ambulatory Visit (INDEPENDENT_AMBULATORY_CARE_PROVIDER_SITE_OTHER): Payer: No Typology Code available for payment source

## 2018-08-12 ENCOUNTER — Other Ambulatory Visit: Payer: Self-pay | Admitting: Radiology

## 2018-08-12 DIAGNOSIS — Z1211 Encounter for screening for malignant neoplasm of colon: Secondary | ICD-10-CM

## 2018-08-12 LAB — FECAL OCCULT BLOOD, IMMUNOCHEMICAL: Fecal Occult Bld: NEGATIVE

## 2018-09-16 ENCOUNTER — Ambulatory Visit (INDEPENDENT_AMBULATORY_CARE_PROVIDER_SITE_OTHER): Payer: No Typology Code available for payment source

## 2018-09-16 ENCOUNTER — Encounter: Payer: Self-pay | Admitting: Physician Assistant

## 2018-09-16 ENCOUNTER — Ambulatory Visit (INDEPENDENT_AMBULATORY_CARE_PROVIDER_SITE_OTHER): Payer: No Typology Code available for payment source | Admitting: Physician Assistant

## 2018-09-16 VITALS — BP 112/68 | HR 84 | Temp 98.5°F | Ht 69.0 in | Wt 313.4 lb

## 2018-09-16 DIAGNOSIS — R079 Chest pain, unspecified: Secondary | ICD-10-CM | POA: Diagnosis not present

## 2018-09-16 DIAGNOSIS — R109 Unspecified abdominal pain: Secondary | ICD-10-CM | POA: Diagnosis not present

## 2018-09-16 DIAGNOSIS — K219 Gastro-esophageal reflux disease without esophagitis: Secondary | ICD-10-CM

## 2018-09-16 LAB — URINALYSIS, ROUTINE W REFLEX MICROSCOPIC
Bilirubin Urine: NEGATIVE
Hgb urine dipstick: NEGATIVE
Ketones, ur: NEGATIVE
Leukocytes, UA: NEGATIVE
Nitrite: NEGATIVE
PH: 5 (ref 5.0–8.0)
RBC / HPF: NONE SEEN (ref 0–?)
Specific Gravity, Urine: >=1.03 — AB (ref 1.000–1.030)
Total Protein, Urine: NEGATIVE
Urine Glucose: NEGATIVE
Urobilinogen, UA: 0.2 (ref 0.0–1.0)

## 2018-09-16 LAB — COMPREHENSIVE METABOLIC PANEL
ALK PHOS: 73 U/L (ref 39–117)
ALT: 49 U/L (ref 0–53)
AST: 35 U/L (ref 0–37)
Albumin: 4.3 g/dL (ref 3.5–5.2)
BUN: 10 mg/dL (ref 6–23)
CO2: 27 mEq/L (ref 19–32)
Calcium: 9.5 mg/dL (ref 8.4–10.5)
Chloride: 100 mEq/L (ref 96–112)
Creatinine, Ser: 0.95 mg/dL (ref 0.40–1.50)
GFR: 88.69 mL/min (ref >60.00)
Glucose, Bld: 138 mg/dL — ABNORMAL HIGH (ref 70–99)
Potassium: 4.1 mEq/L (ref 3.5–5.1)
Sodium: 138 mEq/L (ref 135–145)
TOTAL PROTEIN: 6.6 g/dL (ref 6.0–8.3)
Total Bilirubin: 1 mg/dL (ref 0.2–1.2)

## 2018-09-16 LAB — TSH: TSH: 3.9 u[IU]/mL (ref 0.35–4.50)

## 2018-09-16 LAB — CBC WITH DIFFERENTIAL/PLATELET
Basophils Absolute: 0 10*3/uL (ref 0.0–0.1)
Basophils Relative: 0.7 % (ref 0.0–3.0)
Eosinophils Absolute: 0.6 10*3/uL (ref 0.0–0.7)
Eosinophils Relative: 7.7 % — ABNORMAL HIGH (ref 0.0–5.0)
HCT: 42.6 % (ref 39.0–52.0)
Hemoglobin: 14.3 g/dL (ref 13.0–17.0)
LYMPHS PCT: 33.8 % (ref 12.0–46.0)
Lymphs Abs: 2.4 10*3/uL (ref 0.7–4.0)
MCHC: 33.7 g/dL (ref 30.0–36.0)
MCV: 85.6 fl (ref 78.0–100.0)
MONOS PCT: 7.3 % (ref 3.0–12.0)
Monocytes Absolute: 0.5 10*3/uL (ref 0.1–1.0)
Neutro Abs: 3.6 10*3/uL (ref 1.4–7.7)
Neutrophils Relative %: 50.5 % (ref 43.0–77.0)
Platelets: 238 10*3/uL (ref 150.0–400.0)
RBC: 4.98 Mil/uL (ref 4.22–5.81)
RDW: 13.8 % (ref 11.5–15.5)
WBC: 7.2 10*3/uL (ref 4.0–10.5)

## 2018-09-16 MED ORDER — SUCRALFATE 1 G PO TABS: 1.0000 g | ORAL_TABLET | Freq: Three times a day (TID) | ORAL | 0 refills | Status: DC

## 2018-09-16 NOTE — Patient Instructions (Addendum)
It was great to see you.  1. Call Dr. Cherlyn Cushing office at [phone]  2. You will be contacted about your referral to the gastroenterologist 3. We will be in touch with your lab and urine results 4. Continue protonix 5. Start carafate -- four times daily. Take 30 min prior to each meal and once at bedtime.  IF ANY WORSENING SYMPTOMS --> GO TO THE ER!

## 2018-09-16 NOTE — Progress Notes (Signed)
Gabriel [name] is a 53 y.o. male here for a follow up of a pre-existing problem.  I acted as a Education administrator for [employer], PA-C Anselmo Pickler, LPN  History of Present Illness:   Chief Complaint  Patient presents with  . Back Pain  . Gastroesophageal Reflux    Back Pain  This is a recurrent problem. Episode onset: Started a week ago. The pain is present in the thoracic spine (Right chest wall). The quality of the pain is described as cramping (throbbing). Radiates to: to Right side of ribs. The pain is at a severity of 4/10. The pain is moderate. The pain is worse during the night. The symptoms are aggravated by position and sitting. Associated symptoms include tingling (Right arm) and weakness (Left leg). Pertinent negatives include no headaches. (Heart burn, chills) Risk factors include obesity. Treatments tried: Protonix, Nitroglycerin 2 days ago. The treatment provided no relief.   Has OSA, does not use CPAP. Sleeps in recliner with a "ceiling fan" blowing over him to make sure he is "getting good air movement."   He is diabetic, CBG at home this morning was 116.   He last saw Dr. Percival Spanish in cardiology back in Jan 2017 for medical management of CAD. He has history of atypical chest pain. He was told to follow-up in two years but did not. Per Dr. Rosezella Florida note, the plan is for patient have routine screenings of exercise stress tests.  Denies significant weight changes or LE edema.  He also states that his symptoms improve and resolve with activity.  Wt Readings from Last 5 Encounters:  09/16/18 (!) 313 lb 6.1 oz (142.1 kg)  06/25/18 (!) 313 lb (142 kg)  03/25/18 (!) 315 lb 6.1 oz (143.1 kg)  08/05/17 (!) 321 lb 9.6 oz (145.9 kg)  06/27/17 (!) 319 lb 8 oz (144.9 kg)   Lab Results  Component Value Date   HGBA1C 6.7 (H) 07/06/2018   Of note, he went to the ER in May 2019 for very similar symptoms. Negative cardiac work-up at that time. He was told to take muscle relaxers  for back pain.  Past Medical History:  Diagnosis Date  . Arthritis    "joints; from where I've had surgeries" (08/17/2013)  . Chest pain    a. 08/2013 [facility]ia CTA: Ca score 238 (95%), LM nl, LAD mod stenosis in D2 area, D1 mild ost stenosis, D2 no signif dzs, LCX small, RCA nl.  . Coronary artery disease    LHC (08/18/13):  pLAD 50, CFX and RCA normal.  EF 55-65%.  LAD lesion FFR:  0.88 (normal).  Med rx recommended.    [name] Dysphagia    Upper endoscopy 11/2013 - without obvious stricture s/p Maloney dilation.   [name] GERD (gastroesophageal reflux disease)   . Hyperlipidemia    a. Dx 3y ago - not on statin.  [name] Hypertension    a. Dx 3y ago.  [name] Hypothyroidism    a. on replacement.  . IBS (irritable bowel syndrome)    colonoscopy 2000  . Obesity   . Sleep apnea    does not use CPAP  . Snoring   . Type II diabetes mellitus ([place])      Social History   Socioeconomic History  . Marital status: Single    Spouse name: Not on file  . Number of children: 0  . Years of education: Not on file  . Highest education level: Not on file  Occupational History  . Occupation: Unemployed  Social Needs  . Financial resource strain: Not on file  . Food insecurity:    Worry: Not on file    Inability: Not on file  . Transportation needs:    Medical: Not on file    Non-medical: Not on file  Tobacco Use  . Smoking status: Former Smoker    Packs/day: 1.50    Years: 24.00    Pack years: 36.00    Types: Cigarettes    Last attempt to quit: 09/10/2007    Years since quitting: 11.0  . Smokeless tobacco: Never Used  Substance and Sexual Activity  . Alcohol use: No  . Drug use: No  . Sexual activity: Yes  Lifestyle  . Physical activity:    Days per week: Not on file    Minutes per session: Not on file  . Stress: Not on file  Relationships  . Social connections:    Talks on phone: Not on file    Gets together: Not on file    Attends religious service: Not on file    Active member of club or  organization: Not on file    Attends meetings of clubs or organizations: Not on file    Relationship status: Not on file  . Intimate partner violence:    Fear of current or ex partner: Not on file    Emotionally abused: Not on file    Physically abused: Not on file    Forced sexual activity: Not on file  Other Topics Concern  . Not on file  Social History Narrative   Lives in [place] with parents (cares for parents).  Never married. Unemployed.     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 ANKLE SURGERY Right    "had a growth in it; cut it out" (08/17/2013)  . CARDIAC CATHETERIZATION N/A 09/28/2015   Procedure: Left Heart Cath and Coronary Angiography;  Surgeon: Peter M Martinique, MD;  Location: [facility];  Service: Cardiovascular;  Laterality: N/A;  . CHOLECYSTECTOMY    . KNEE ARTHROSCOPY Right X 2  . LEFT HEART CATHETERIZATION WITH CORONARY ANGIOGRAM N/A 08/18/2013   Procedure: LEFT HEART CATHETERIZATION WITH CORONARY ANGIOGRAM;  Surgeon: Peter M Martinique, MD;  [facility] C[facility];  Service: Cardiovascular;  Laterality: N/A;  . SHOULDER ARTHROSCOPY W/ ROTATOR CUFF REPAIR Left     Family History  Adopted: Yes  Problem Relation Age of Onset  . Hypertension Mother   . Obesity Mother   . Diabetes Mother   . Coronary artery disease Father        s/p MI in his early 30's->CABG x 5 @ age 93, currently 32.  . Diabetes Father   . Stroke Father   . Hypertension Father   . Diabetes Brother   . Obesity Brother   . Asthma Maternal Grandfather     No Known Allergies  Current Medications:   Current Outpatient Medications:  .  acetaminophen (TYLENOL) 500 MG tablet, Take 1,000 mg by mouth every 6 (six) hours as needed for mild pain or moderate pain. Reported on 01/09/2016, Disp: , Rfl:  .  aspirin EC 81 MG EC tablet, Take 1 tablet (81 mg total) by mouth daily., Disp: , Rfl:  .   glimepiride (AMARYL) 4 MG tablet, Take 1 tablet (4 mg total) by mouth daily before breakfast., Disp: 30 tablet, Rfl: 3 .  glucose blood (ONE TOUCH TEST STRIPS)  test strip, Up to 4 times daily.  Onetouch Verio.  E11.65, Disp: 100 each, Rfl: 12 .  Lancets (ONETOUCH ULTRASOFT) lancets, Up to 4 times daily.  Onetouch Verio.  DxE11.65, Disp: 100 each, Rfl: 12 .  levothyroxine (SYNTHROID, LEVOTHROID) 100 MCG tablet, TAKE 1 TABLET BY MOUTH ONCE DAILY, Disp: 90 tablet, Rfl: 1 .  losartan (COZAAR) 100 MG tablet, TAKE 1 TABLET BY MOUTH ONCE DAILY, Disp: 90 tablet, Rfl: 1 .  metFORMIN (GLUCOPHAGE) 1000 MG tablet, TAKE 1 TABLET BY MOUTH TWICE DAILY WITH MEALS, Disp: 180 tablet, Rfl: 3 .  Multiple Vitamins-Minerals (MULTIVITAMIN,TX-MINERALS) tablet, Take 1 tablet by mouth daily.  , Disp: , Rfl:  .  nitroGLYCERIN (NITROSTAT) 0.4 MG SL tablet, Place 1 tablet (0.4 mg total) under the tongue every 5 (five) minutes as needed for chest pain (CP or SOB)., Disp: 25 tablet, Rfl: 3 .  pantoprazole (PROTONIX) 40 MG tablet, TAKE 1 TABLET BY MOUTH ONCE DAILY, Disp: 90 tablet, Rfl: 1 .  simvastatin (ZOCOR) 40 MG tablet, TAKE 1 TABLET BY MOUTH AT BEDTIME, Disp: 90 tablet, Rfl: 1 .  sucralfate (CARAFATE) 1 g tablet, Take 1 tablet (1 g total) by mouth 4 (four) times daily -  with meals and at bedtime., Disp: 56 tablet, Rfl: 0   Review of Systems:   Review of Systems  Musculoskeletal: Positive for back pain.  Neurological: Positive for tingling (Right arm) and weakness (Left leg). Negative for headaches.    Vitals:   Vitals:   09/16/18 1108  BP: 112/68  Pulse: 84  Temp: 98.5 F (36.9 C)  TempSrc: Oral  SpO2: 94%  Weight: (!) 313 lb 6.1 oz (142.1 kg)  Height: 5\' 9"  (1.753 m)     Body mass index is 46.28 kg/m.  Physical Exam:   Physical Exam Vitals signs and nursing note reviewed.  Constitutional:      General: He is not in acute distress.    Appearance: He is well-developed. He is not ill-appearing or  toxic-appearing.  Cardiovascular:     Rate and Rhythm: Normal rate and regular rhythm.     Pulses: Normal pulses.     Heart sounds: Normal heart sounds, S1 normal and S2 normal.     Comments: No LE edema Pulmonary:     Effort: Pulmonary effort is normal.     Breath sounds: Normal breath sounds.  Abdominal:     General: Bowel sounds are normal.     Palpations: Abdomen is soft.     Tenderness: There is no abdominal tenderness.  Skin:    General: Skin is warm and dry.  Neurological:     Mental Status: He is alert.     GCS: GCS eye subscore is 4. GCS verbal subscore is 5. GCS motor subscore is 6.  Psychiatric:        Speech: Speech normal.        Behavior: Behavior normal. Behavior is cooperative.       EKG tracing is personally reviewed.  EKG notes NSR.  No acute changes.    Assessment and Plan:   Ladarrion was seen today for back pain and gastroesophageal reflux.  Diagnoses and all orders for this visit:  Chest pain, unspecified type and Flank pain; GERD EKG tracing is personally reviewed.  EKG notes NSR.  No acute changes. Received GI cocktail in office and tolerated well, with improvement of symptoms. Will order chest xray and labs for further work-up. UA as well. I discussed with patient importance of cardiology  follow-up, especially with current known history of current nonosbtructive disease, -- needs to call office and schedule f/u with Dr. Percival Spanish, likely due for stress test. Also encouraged compliance with CPAP. Will refer to GI for further work-up of abdominal pain. Very low threshold to go to the ER -- discussed that if any changes in symptoms he needs to go to the ER, he verbalized understanding. Will add carafate for two weeks while on protonix to see if this helps with symptoms as well. -     EKG 12-Lead -     DG Chest 2 View; Future -     CBC with Differential/Platelet -     Comprehensive metabolic panel -     TSH -     Urinalysis, Routine w reflex  microscopic  Other orders -     sucralfate (CARAFATE) 1 g tablet; Take 1 tablet (1 g total) by mouth 4 (four) times daily -  with meals and at bedtime.    . Reviewed expectations re: course of current medical issues. . Discussed self-management of symptoms. . Outlined signs and symptoms indicating need for more acute intervention. . Patient verbalized understanding and all questions were answered. . See orders for this visit as documented in the electronic medical record. . Patient received an After-Visit Summary.  CMA or LPN served as scribe during this visit. History, Physical, and Plan performed by medical provider. The above documentation has been reviewed and is accurate and complete.   Inda Coke, PA-C

## 2018-09-25 ENCOUNTER — Other Ambulatory Visit (INDEPENDENT_AMBULATORY_CARE_PROVIDER_SITE_OTHER): Payer: No Typology Code available for payment source

## 2018-09-25 ENCOUNTER — Encounter: Payer: Self-pay | Admitting: Gastroenterology

## 2018-09-25 ENCOUNTER — Ambulatory Visit (INDEPENDENT_AMBULATORY_CARE_PROVIDER_SITE_OTHER): Payer: No Typology Code available for payment source | Admitting: Gastroenterology

## 2018-09-25 VITALS — BP 124/78 | HR 77 | Ht 69.0 in | Wt 316.0 lb

## 2018-09-25 DIAGNOSIS — R194 Change in bowel habit: Secondary | ICD-10-CM | POA: Diagnosis not present

## 2018-09-25 DIAGNOSIS — R1013 Epigastric pain: Secondary | ICD-10-CM | POA: Diagnosis not present

## 2018-09-25 DIAGNOSIS — K625 Hemorrhage of anus and rectum: Secondary | ICD-10-CM

## 2018-09-25 DIAGNOSIS — Z8371 Family history of colonic polyps: Secondary | ICD-10-CM

## 2018-09-25 DIAGNOSIS — K219 Gastro-esophageal reflux disease without esophagitis: Secondary | ICD-10-CM | POA: Diagnosis not present

## 2018-09-25 LAB — HEPATIC FUNCTION PANEL
ALK PHOS: 76 U/L (ref 39–117)
ALT: 47 U/L (ref 0–53)
AST: 30 U/L (ref 0–37)
Albumin: 4.5 g/dL (ref 3.5–5.2)
Bilirubin, Direct: 0.1 mg/dL (ref 0.0–0.3)
Total Bilirubin: 0.9 mg/dL (ref 0.2–1.2)
Total Protein: 7.3 g/dL (ref 6.0–8.3)

## 2018-09-25 LAB — AMYLASE: Amylase: 45 U/L (ref 27–131)

## 2018-09-25 LAB — LIPASE: Lipase: 29 U/L (ref 11.0–59.0)

## 2018-09-25 MED ORDER — PANTOPRAZOLE SODIUM 40 MG PO TBEC: 40.0000 mg | DELAYED_RELEASE_TABLET | Freq: Two times a day (BID) | ORAL | 2 refills | Status: DC

## 2018-09-25 NOTE — Progress Notes (Signed)
[place] VISIT   Primary Care Provider Marin Olp, MD [place] [id number] [phone]  Referring Provider Inda Coke, [address] [place], [place] [address] [phone]  Patient Profile: Gabriel [name] is a 53 y.o. male with a pmh significant for CAD, GERD, IBS, previous dysphagia (status post empiric dilation), sleep apnea, diabetes, arthritis, status post cholecystectomy.  The patient presents to the [facility] for an evaluation and management of problem(s) noted below:  Problem List 1. Gastroesophageal reflux disease, esophagitis presence not specified   2. Midepigastric pain   3. BRBPR (bright red blood per rectum)   4. Change in bowel habits   5. Family hx colonic polyps     History of Present Illness: This is the patient's first visit to the outpatient of our GI clinic in years.  The patient carries a history of irritable bowel syndrome for years.  The patient had a cholecystectomy years ago for issues of recurrent right upper quadrant abdominal discomfort.  Patient had a history of a colonoscopy greater than 20 years ago which found evidence of internal hemorrhoids.  The patient states that he will infrequently have a midepigastric abdominal discomfort which to him is reminiscent of his prior gallbladder issues.  The patient recently was given Protonix because of concern of gastritis.  He states while being on Protonix that he began to experience more significant leg cramps and myalgias as well as some chest discomfort.  The chest discomfort was noted as burning sensation.  He is also been feeling increased issues of acid reflux over the course of the last month.  He has had bright red blood per rectum on the toilet paper which occurs a few times per week and he is attributed this to his previously noted internal hemorrhoids.  He has not had a repeat colonoscopy at age 41.  The patient has  noted a change in his bowel habits.  Patient previously had issues of dysphagia and underwent an upper endoscopy in 2015 in had an empiric dilation performed.  He currently states that he does not have any overt dysphagia symptoms.  He denies odynophagia.  The patient does take aspirin but no other nonsteroidals.  GI Review of Systems Positive as above Negative for nausea, vomiting, melena  Review of Systems General: Denies fevers/chills/weight loss HEENT: Denies oral lesions Cardiovascular: Denies current chest pain Pulmonary: Denies shortness of breath/cough Gastroenterological: See HPI Genitourinary: Denies darkened urine Hematological: Denies easy bruising/bleeding Dermatological: Denies jaundice Psychological: Mood is stable   Medications Current Outpatient Medications  Medication Sig Dispense Refill  . acetaminophen (TYLENOL) 500 MG tablet Take 1,000 mg by mouth every 6 (six) hours as needed for mild pain or moderate pain. Reported on 01/09/2016    . aspirin EC 81 MG EC tablet Take 1 tablet (81 mg total) by mouth daily.    [name] glimepiride (AMARYL) 4 MG tablet Take 1 tablet (4 mg total) by mouth daily before breakfast. 30 tablet 3  . glucose blood (ONE TOUCH TEST STRIPS) test strip Up to 4 times daily.  Onetouch Verio.  E11.65 100 each 12  . Lancets (ONETOUCH ULTRASOFT) lancets Up to 4 times daily.  Onetouch Verio.  DxE11.65 100 each 12  . levothyroxine (SYNTHROID, LEVOTHROID) 100 MCG tablet TAKE 1 TABLET BY MOUTH ONCE DAILY 90 tablet 1  . losartan (COZAAR) 100 MG tablet TAKE 1 TABLET BY MOUTH ONCE DAILY 90 tablet 1  . metFORMIN (GLUCOPHAGE) 1000 MG tablet  TAKE 1 TABLET BY MOUTH TWICE DAILY WITH MEALS 180 tablet 3  . Multiple Vitamins-Minerals (MULTIVITAMIN,TX-MINERALS) tablet Take 1 tablet by mouth daily.      . nitroGLYCERIN (NITROSTAT) 0.4 MG SL tablet Place 1 tablet (0.4 mg total) under the tongue every 5 (five) minutes as needed for chest pain (CP or SOB). 25 tablet 3  .  simvastatin (ZOCOR) 40 MG tablet TAKE 1 TABLET BY MOUTH AT BEDTIME 90 tablet 1  . sucralfate (CARAFATE) 1 g tablet Take 1 tablet (1 g total) by mouth 4 (four) times daily -  with meals and at bedtime. 56 tablet 0  . pantoprazole (PROTONIX) 40 MG tablet TAKE 1 TABLET BY MOUTH ONCE DAILY (Patient not taking: Reported on 09/25/2018) 90 tablet 1  . pantoprazole (PROTONIX) 40 MG tablet Take 1 tablet (40 mg total) by mouth 2 (two) times daily. 60 tablet 2   No current facility-administered medications for this visit.     Allergies No Known Allergies  Histories Past Medical History:  Diagnosis Date  . Arthritis    "joints; from where I've had surgeries" (08/17/2013)  . Chest pain    a. 08/2013 Cardia CTA: Ca score 238 (95%), LM nl, LAD mod stenosis in D2 area, D1 mild ost stenosis, D2 no signif dzs, LCX small, RCA nl.  . Coronary artery disease    LHC (08/18/13):  pLAD 50, CFX and RCA normal.  EF 55-65%.  LAD lesion FFR:  0.88 (normal).  Med rx recommended.    [name] Dysphagia    Upper endoscopy 11/2013 - without obvious stricture s/p Maloney dilation.   [name] GERD (gastroesophageal reflux disease)   . Hyperlipidemia    a. Dx 3y ago - not on statin.  [name] Hypertension    a. Dx 3y ago.  [name] Hypothyroidism    a. on replacement.  . IBS (irritable bowel syndrome)    colonoscopy 2000  . Obesity   . Sleep apnea    does not use CPAP  . Snoring   . Type II diabetes mellitus ([place])    Past Surgical History:  Procedure Laterality Date  . ANKLE SURGERY Right    "had a growth in it; cut it out" (08/17/2013)  . CARDIAC CATHETERIZATION N/A 09/28/2015   Procedure: Left Heart Cath and Coronary Angiography;  Surgeon: Peter M Martinique, MD;  Location: [facility];  Service: Cardiovascular;  Laterality: N/A;  . CHOLECYSTECTOMY    . KNEE ARTHROSCOPY Right X 2  . LEFT HEART CATHETERIZATION WITH CORONARY ANGIOGRAM N/A 08/18/2013   Procedure: LEFT HEART CATHETERIZATION WITH CORONARY ANGIOGRAM;  Surgeon: Peter M  Martinique, MD;  Location[facility] CATH [facility];  Service: Cardiovascular;  Laterality: N/A;  . SHOULDER ARTHROSCOPY W/ ROTATOR CUFF REPAIR Left    Social History   Socioeconomic History  . Marital status: Single    Spouse name: Not on file  . Number of children: 0  . Years of education: Not on file  . Highest education level: Not on file  Occupational History  . Occupation: Unemployed  Social Needs  . Financial resource strain: Not on file  . Food insecurity:    Worry: Not on file    Inability: Not on file  . Transportation needs:    Medical: Not on file    Non-medical: Not on file  Tobacco Use  . Smoking status: Former Smoker    Packs/day: 1.50    Years: 24.00    Pack years: 36.00    Types: Cigarettes  Last attempt to quit: 09/10/2007    Years since quitting: 11.0  . Smokeless tobacco: Never Used  Substance and Sexual Activity  . Alcohol use: No  . Drug use: No  . Sexual activity: Yes  Lifestyle  . Physical activity:    Days per week: Not on file    Minutes per session: Not on file  . Stress: Not on file  Relationships  . Social connections:    Talks on phone: Not on file    Gets together: Not on file    Attends religious service: Not on file    Active member of club or organization: Not on file    Attends meetings of clubs or organizations: Not on file    Relationship status: Not on file  . Intimate partner violence:    Fear of current or ex partner: Not on file    Emotionally abused: Not on file    Physically abused: Not on file    Forced sexual activity: Not on file  Other Topics Concern  . Not on file  Social History Narrative   Lives in [place] with parents (cares for parents).  Never married. Unemployed.     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[employer] (stops intermittently while caring for parents)   Family History  Adopted: Yes  Problem Relation Age of Onset  . Hypertension Mother   . Obesity Mother   . Diabetes Mother   . Coronary artery disease Father         s/p MI in his early 30's->CABG x 5 @ age 88, currently 68.  . Diabetes Father   . Stroke Father   . Hypertension Father   . Diabetes Brother   . Obesity Brother   . Asthma Maternal Grandfather   . Colon cancer Neg Hx   . Esophageal cancer Neg Hx   . Inflammatory bowel disease Neg Hx   . Liver disease Neg Hx   . Pancreatic cancer Neg Hx   . Rectal cancer Neg Hx   . Stomach cancer Neg Hx    I have reviewed his medical, social, and family history in detail and updated the electronic medical record as necessary.    PHYSICAL EXAMINATION  BP 124/78   Pulse 77   Ht 5\' 9"  (1.753 m)   Wt (!) 316 lb (143.3 kg)   SpO2 95%   BMI 46.67 kg/m  Wt Readings from Last 3 Encounters:  09/25/18 (!) 316 lb (143.3 kg)  09/16/18 (!) 313 lb 6.1 oz (142.1 kg)  06/25/18 (!) 313 lb (142 kg)  GEN: NAD, appears stated age, doesn't appear chronically ill PSYCH: Cooperative, without pressured speech EYE: Conjunctivae pink, sclerae anicteric ENT: MMM, without oral ulcers, no erythema or exudates noted NECK: Supple CV: RR without R/Gs  RESP: CTAB posteriorly, without wheezing GI: NABS, soft, obese, ventral diastases present, rounded, nontender, without rebound or guarding, unable to appreciate hepatosplenomegaly due to body habitus MSK/EXT: 1/2+ bilateral pedal edema SKIN: No jaundice NEURO:  Alert & Oriented x 3, no focal deficits   REVIEW OF DATA  I reviewed the following data at the time of this encounter:  GI Procedures and Studies  February 2000 colonoscopy Indication for alteration in bowel habits and possible IBD as well as rectal bleeding. Normal intrinsic colon and terminal ileum with no evidence of inflammatory bowel disease.  Intermittent bleeding secondary to internal hemorrhoids.  Lower abdominal symptoms due to irritable bowel syndrome.  March 2015 EGD The upper, middle, distal third of  the esophagus were carefully inspected and showed no abnormalities.  The Z line was well seen at  the GE junction.  The stomach fundus, antrum, gastric body, first and second part of the duodenum were unremarkable.  Due to issues of dysphagia a 61 French Maloney dilator was passed without resistance with no pain.  Laboratory Studies  Reviewed in epic and care everywhere  Imaging Studies  5/19 CTAP IMPRESSION: 1. Negative for nephrolithiasis, hydronephrosis, or ureteral stone 2. No CT evidence for acute intra-abdominal or pelvic abnormality   ASSESSMENT  Mr. Kristiansen is a 53 y.o. male with a pmh significant for CAD, GERD, IBS, previous dysphagia (status post empiric dilation), sleep apnea, diabetes, arthritis, status post cholecystectomy.  The patient is seen today for evaluation and management of:  1. Gastroesophageal reflux disease, esophagitis presence not specified   2. Midepigastric pain   3. BRBPR (bright red blood per rectum)   4. Change in bowel habits   5. Family hx colonic polyps    The patient is hemodynamically stable.  Based on the patient's description of his clinical symptoms I think he is having progressive GERD symptoms.  He may have developed a hiatal hernia.  With the slight change in his bowel habits and his bright red blood per rectum think further evaluation is necessary.  He has a history of internal hemorrhoids based on colonoscopy 20 years ago.  He is due for colon cancer screening and thus we will get him up-to-date with colon cancer screening in this particular case.  Pending on how his symptoms are with the addition of fiber supplementation will consider the role after a follow-up in clinic to determine need for diagnostic biopsies of the colon as well.  We will increase his PPI to high-dose therapy for the next few weeks and plan to see him back in clinic and determine based on his symptoms and need for an endoscopy to be performed.  I suspect we will proceed with one but I would like to see how he does with an empiric trial for next 6 to 8 weeks.  At time of  endoscopy I would likely then obtain EOE biopsies to rule that out.  The etiology of his recurrent discomfort is not clear to me.  However he is doing well currently.  We will plan to obtain a future set of labs in the course of 6 to 10 hours after presenting with his pain to determine whether he could have any evidence of biliary pathology.  It not clear to me that his ventral diastases is causing him issues but if we worked him up and there is no other etiology that is found and we would consider the role of sending him to the surgeons to discuss whether there would be a role for ventral hernia repair.  All patient questions were answered, to the best of my ability, and the patient agrees to the aforementioned plan of action with follow-up as indicated.   PLAN  Future orders as outlined below Increase PPI to 40 mg twice daily See back in clinic in approximately 6 to 8 weeks to determine need for diagnostic upper endoscopy Patient will need at least a screening colonoscopy later this year -At time of procedure will determine need for biopsies dependent on symptoms Start FiberCon daily Consider role of stool softeners 2 times daily    Orders Placed This Encounter  Procedures  . Amylase  . Lipase  . Hepatic function panel    New Prescriptions  PANTOPRAZOLE (PROTONIX) 40 MG TABLET    Take 1 tablet (40 mg total) by mouth 2 (two) times daily.   Modified Medications   No medications on file    Planned Follow Up: No follow-ups on file.   Justice Britain, MD [facility] Office # [phone]

## 2018-09-25 NOTE — Patient Instructions (Addendum)
Normal BMI (Body Mass Index- based on height and weight) is between 19 and 25. Your BMI today is Body mass index is 46.67 kg/m. [name] Please consider follow up  regarding your BMI with your Primary Care Provider.  Your provider has requested that you go to the basement level for lab work before leaving today. Press "B" on the elevator. The lab is located at the first door on the left as you exit the elevator.  We have sent the following medications to your pharmacy for you to pick up at your convenience: Protonix  Start fibercon daily  Thank you for entrusting me with your care and choosing [facility].  Dr Rush Landmark

## 2018-09-27 ENCOUNTER — Encounter: Payer: Self-pay | Admitting: Gastroenterology

## 2018-09-27 DIAGNOSIS — R1013 Epigastric pain: Secondary | ICD-10-CM | POA: Insufficient documentation

## 2018-09-27 DIAGNOSIS — Z8371 Family history of colonic polyps: Secondary | ICD-10-CM | POA: Insufficient documentation

## 2018-09-27 DIAGNOSIS — K625 Hemorrhage of anus and rectum: Secondary | ICD-10-CM | POA: Insufficient documentation

## 2018-09-27 DIAGNOSIS — R194 Change in bowel habit: Secondary | ICD-10-CM | POA: Insufficient documentation

## 2018-10-22 ENCOUNTER — Encounter: Payer: Self-pay | Admitting: Gastroenterology

## 2018-10-22 ENCOUNTER — Ambulatory Visit (INDEPENDENT_AMBULATORY_CARE_PROVIDER_SITE_OTHER): Payer: No Typology Code available for payment source | Admitting: Gastroenterology

## 2018-10-22 VITALS — BP 128/72 | HR 103 | Ht 69.0 in | Wt 319.0 lb

## 2018-10-22 DIAGNOSIS — Z1211 Encounter for screening for malignant neoplasm of colon: Secondary | ICD-10-CM | POA: Diagnosis not present

## 2018-10-22 DIAGNOSIS — K219 Gastro-esophageal reflux disease without esophagitis: Secondary | ICD-10-CM | POA: Diagnosis not present

## 2018-10-22 DIAGNOSIS — K625 Hemorrhage of anus and rectum: Secondary | ICD-10-CM | POA: Diagnosis not present

## 2018-10-22 DIAGNOSIS — Z8371 Family history of colonic polyps: Secondary | ICD-10-CM | POA: Diagnosis not present

## 2018-10-22 MED ORDER — OMEPRAZOLE 40 MG PO CPDR: 40.0000 mg | DELAYED_RELEASE_CAPSULE | Freq: Two times a day (BID) | ORAL | 1 refills | Status: DC

## 2018-10-22 NOTE — Patient Instructions (Signed)
If you are age 52 or older, your body mass index should be between 23-30. Your Body mass index is 47.11 kg/m. If this is out of the aforementioned range listed, please consider follow up with your Primary Care Provider.  If you are age 54 or younger, your body mass index should be between 19-25. Your Body mass index is 47.11 kg/m. If this is out of the aformentioned range listed, please consider follow up with your Primary Care Provider.    We have sent the following medications to your pharmacy for you to pick up at your convenience: Omeprazole 40mg    Start Omeprazole 40mg  twice daily until April 2020, then decrease to 40mg  once daily until June 1st 2020. Start over the counter Omeprazole 20mg  once daily for 2 months thereafter.   We have put in a colonoscopy recall for May or June 2020 .  Thank you for choosing me and [facility].  Dr. Rush Landmark

## 2018-10-22 NOTE — Progress Notes (Signed)
[place] VISIT   Primary Care Provider Marin Olp, MD [place] [address] [phone]  Patient Profile: Gabriel [name] is a 53 y.o. male with a pmh significant for CAD, GERD, IBS, previous dysphagia (status post empiric dilation), sleep apnea, diabetes, arthritis, status post cholecystectomy.  The patient presents to the [facility] for an evaluation and management of problem(s) noted below:  Problem List 1. Gastroesophageal reflux disease, esophagitis presence not specified   2. Colon cancer screening   3. Family hx colonic polyps   4. BRBPR (bright red blood per rectum)     History of Present Illness: Please see initial consultation note for full details of HPI.    Interval History: The patient returns for scheduled follow-up.  Overall, with his PPI therapy on board he is doing very well.  His acid reflux has gone away completely.  The patient describes no dysphagia.  He believes that at this point in time because things have worked out well she would like to hold on an upper endoscopy but move forward with a colonoscopy later this year.  He describes no significant hematochezia our last visit.  There are no other significant GI complaints.  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    GI Review of Systems Positive as above Negative for abdominal pain, nausea, vomiting, odynophagia, melena  Review of Systems General: Denies fevers/chills/weight loss Cardiovascular: Denies chest pain Pulmonary: Denies shortness of breath/nocturnal cough Gastroenterological: See HPI Genitourinary: Denies darkened urine Hematological: Denies easy bruising Dermatological: Denies jaundice Psychological: Mood is stable   Medications Current Outpatient Medications  Medication Sig Dispense Refill  . acetaminophen (TYLENOL) 500 MG tablet Take 1,000 mg by mouth every 6 (six)  hours as needed for mild pain or moderate pain. Reported on 01/09/2016    . aspirin EC 81 MG EC tablet Take 1 tablet (81 mg total) by mouth daily.    [name] glimepiride (AMARYL) 4 MG tablet Take 1 tablet (4 mg total) by mouth daily before breakfast. 30 tablet 3  . glucose blood (ONE TOUCH TEST STRIPS) test strip Up to 4 times daily.  Onetouch Verio.  E11.65 100 each 12  . Lancets (ONETOUCH ULTRASOFT) lancets Up to 4 times daily.  Onetouch Verio.  DxE11.65 100 each 12  . levothyroxine (SYNTHROID, LEVOTHROID) 100 MCG tablet TAKE 1 TABLET BY MOUTH ONCE DAILY 90 tablet 1  . losartan (COZAAR) 100 MG tablet TAKE 1 TABLET BY MOUTH ONCE DAILY 90 tablet 1  . metFORMIN (GLUCOPHAGE) 1000 MG tablet TAKE 1 TABLET BY MOUTH TWICE DAILY WITH MEALS 180 tablet 3  . Multiple Vitamins-Minerals (MULTIVITAMIN,TX-MINERALS) tablet Take 1 tablet by mouth daily.      . nitroGLYCERIN (NITROSTAT) 0.4 MG SL tablet Place 1 tablet (0.4 mg total) under the tongue every 5 (five) minutes as needed for chest pain (CP or SOB). 25 tablet 3  . simvastatin (ZOCOR) 40 MG tablet TAKE 1 TABLET BY MOUTH AT BEDTIME 90 tablet 1  . omeprazole (PRILOSEC) 40 MG capsule Take 1 capsule (40 mg total) by mouth 2 (two) times daily. 60 capsule 1   No current facility-administered medications for this visit.     Allergies No Known Allergies  Histories Past Medical History:  Diagnosis Date  . Arthritis    "joints; from where I've had surgeries" (08/17/2013)  . Chest pain    a. 08/2013 [facility] CTA: Ca score 238 (95%), LM nl,  LAD mod stenosis in D2 area, D1 mild ost stenosis, D2 no signif dzs, LCX small, RCA nl.  . Coronary artery disease    LHC (08/18/13):  pLAD 50, CFX and RCA normal.  EF 55-65%.  LAD lesion FFR:  0.88 (normal).  Med rx recommended.    [name] Dysphagia    Upper endoscopy 11/2013 - without obvious stricture s/p Maloney dilation.   [name] GERD (gastroesophageal reflux disease)   . Hyperlipidemia    a. Dx 3y ago - not on statin.  [name] Hypertension     a. Dx 3y ago.  [name] Hypothyroidism    a. on replacement.  . IBS (irritable bowel syndrome)    colonoscopy 2000  . Obesity   . Sleep apnea    does not use CPAP  . Snoring   . Type II diabetes mellitus ([place])    Past Surgical History:  Procedure Laterality Date  . ANKLE SURGERY Right    "had a growth in it; cut it out" (08/17/2013)  . CARDIAC CATHETERIZATION N/A 09/28/2015   Procedure: Left Heart Cath and Coronary Angiography;  Surgeon: Peter M Martinique, MD;  Location: [facility];  Service: Cardiovascular;  Laterality: N/A;  . CHOLECYSTECTOMY    . KNEE ARTHROSCOPY Right X 2  . LEFT HEART CATHETERIZATION WITH CORONARY ANGIOGRAM N/A 08/18/2013   Procedure: LEFT HEART CATHETERIZATION WITH CORONARY ANGIOGRAM;  Surgeon: Peter M Martinique, MD;  Location: [facility];  Service: Cardiovascular;  Laterality: N/A;  . SHOULDER ARTHROSCOPY W/ ROTATOR CUFF REPAIR Left    Social History   Socioeconomic History  . Marital status: Single    Spouse name: Not on file  . Number of children: 0  . Years of education: Not on file  . Highest education level: Not on file  Occupational History  . Occupation: Unemployed  Social Needs  . Financial resource strain: Not on file  . Food insecurity:    Worry: Not on file    Inability: Not on file  . Transportation needs:    Medical: Not on file    Non-medical: Not on file  Tobacco Use  . Smoking status: Former Smoker    Packs/day: 1.50    Years: 24.00    Pack years: 36.00    Types: Cigarettes    Last attempt to quit: 09/10/2007    Years since quitting: 11.1  . Smokeless tobacco: Never Used  Substance and Sexual Activity  . Alcohol use: No  . Drug use: No  . Sexual activity: Yes  Lifestyle  . Physical activity:    Days per week: Not on file    Minutes per session: Not on file  . Stress: Not on file  Relationships  . Social connections:    Talks on phone: Not on file    Gets together: Not on file    Attends religious service: Not on file      Active member of club or organization: Not on file    Attends meetings of clubs or organizations: Not on file    Relationship status: Not on file  . Intimate partner violence:    Fear of current or ex partner: Not on file    Emotionally abused: Not on file    Physically abused: Not on file    Forced sexual activity: Not on file  Other Topics Concern  . Not on file  Social History Narrative   Lives in [place] with parents (cares for parents).  Never married. Unemployed.  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[employer] (stops intermittently while caring for parents)   Family History  Adopted: Yes  Problem Relation Age of Onset  . Hypertension Mother   . Obesity Mother   . Diabetes Mother   . Coronary artery disease Father        s/p MI in his early 30's->CABG x 5 @ age 68, currently 48.  . Diabetes Father   . Stroke Father   . Hypertension Father   . Diabetes Brother   . Obesity Brother   . Asthma Maternal Grandfather   . Colon cancer Neg Hx   . Esophageal cancer Neg Hx   . Inflammatory bowel disease Neg Hx   . Liver disease Neg Hx   . Pancreatic cancer Neg Hx   . Rectal cancer Neg Hx   . Stomach cancer Neg Hx    I have reviewed his medical, social, and family history in detail and updated the electronic medical record as necessary.    PHYSICAL EXAMINATION  BP 128/72   Pulse (!) 103   Ht 5\' 9"  (1.753 m)   Wt (!) 319 lb (144.7 kg)   BMI 47.11 kg/m  Wt Readings from Last 3 Encounters:  10/22/18 (!) 319 lb (144.7 kg)  09/25/18 (!) 316 lb (143.3 kg)  09/16/18 (!) 313 lb 6.1 oz (142.1 kg)  GEN: NAD, appears stated age, doesn't appear chronically ill PSYCH: Cooperative, without pressured speech EYE: Conjunctivae pink, sclerae anicteric ENT: MMM CV: RR without R/Gs  RESP: CTAB posteriorly, without wheezing GI: NABS, soft, obese, ventral diastases present, rounded, nontender, without rebound or guarding MSK/EXT: Trace bilateral lower extremity edema SKIN: No  jaundice NEURO:  Alert & Oriented x 3, no focal deficits   REVIEW OF DATA  I reviewed the following data at the time of this encounter:  GI Procedures and Studies  February 2000 colonoscopy Indication for alteration in bowel habits and possible IBD as well as rectal bleeding. Normal intrinsic colon and terminal ileum with no evidence of inflammatory bowel disease.  Intermittent bleeding secondary to internal hemorrhoids.  Lower abdominal symptoms due to irritable bowel syndrome.  March 2015 EGD The upper, middle, distal third of the esophagus were carefully inspected and showed no abnormalities.  The Z line was well seen at the GE junction.  The stomach fundus, antrum, gastric body, first and second part of the duodenum were unremarkable.  Due to issues of dysphagia a 51 French Maloney dilator was passed without resistance with no pain.  Laboratory Studies  Reviewed in epic and care everywhere  Imaging Studies  5/19 CTAP IMPRESSION: 1. Negative for nephrolithiasis, hydronephrosis, or ureteral stone 2. No CT evidence for acute intra-abdominal or pelvic abnormality   ASSESSMENT  Gabriel [name] is a 53 y.o. male with a pmh significant for CAD, GERD, IBS, previous dysphagia (status post empiric dilation), sleep apnea, diabetes, arthritis, status post cholecystectomy.  The patient is seen today for evaluation and management of:  1. Gastroesophageal reflux disease, esophagitis presence not specified   2. Colon cancer screening   3. Family hx colonic polyps   4. BRBPR (bright red blood per rectum)    The patient is doing well overall.  His GERD symptoms are under control completely with his PPI therapy.  Based on more recent guidelines the suggestion of consideration of an upper endoscopy and patient to have new onset GERD, the age is increased and thus, with the patient feeling better I think it is reasonable to hold off an  upper endoscopy.  We will down titrate him over the course the  next few months with the hope of being able to come off medications completely.  However, if the patient cannot come off medications completely or has an exacerbation of symptoms then we will consider an upper endoscopy.  The patient is due for colon cancer screening and he wants to get this done in the course of the next few months.  The risks and benefits of endoscopic evaluation were discussed with the patient; these include but are not limited to the risk of perforation, infection, bleeding, missed lesions, lack of diagnosis, severe illness requiring hospitalization, as well as anesthesia and sedation related illnesses.  The patient is agreeable to proceed.  All patient questions were answered, to the best of my ability, and the patient agrees to the aforementioned plan of action with follow-up as indicated.   PLAN  Proceed with scheduling colonoscopy later this year Continue omeprazole 40 mg twice daily through March 2020 Transition to omeprazole 40 mg daily April 2020 through May 2020 Transition to omeprazole 20 mg daily over-the-counter June 2020 through July 2020 and then come off If patient has exacerbation of symptoms then will increase back to patient's favorable dose and plan an upper endoscopy or earlier if symptoms Continue FiberCon Consider stool softeners daily   No orders of the defined types were placed in this encounter.   New Prescriptions   OMEPRAZOLE (PRILOSEC) 40 MG CAPSULE    Take 1 capsule (40 mg total) by mouth 2 (two) times daily.   Modified Medications   No medications on file    Planned Follow Up: No follow-ups on file.   Justice Britain, MD [facility] Advanced Endoscopy Office # [phone]

## 2018-10-24 ENCOUNTER — Telehealth: Payer: Self-pay | Admitting: Family Medicine

## 2018-10-24 NOTE — Telephone Encounter (Signed)
We need to get patient scheduled for diabetes follow-up.  Does not look like he is on the books yet.  Sometime within 1 to 2 months would be great

## 2018-10-26 NOTE — Telephone Encounter (Signed)
Patient is scheduled for March 23

## 2018-10-27 DIAGNOSIS — Z1211 Encounter for screening for malignant neoplasm of colon: Secondary | ICD-10-CM | POA: Insufficient documentation

## 2018-11-02 ENCOUNTER — Other Ambulatory Visit: Payer: Self-pay | Admitting: Family Medicine

## 2018-11-10 ENCOUNTER — Other Ambulatory Visit: Payer: Self-pay | Admitting: Family Medicine

## 2018-11-30 ENCOUNTER — Ambulatory Visit: Payer: No Typology Code available for payment source | Admitting: Family Medicine

## 2018-11-30 ENCOUNTER — Ambulatory Visit (INDEPENDENT_AMBULATORY_CARE_PROVIDER_SITE_OTHER): Payer: No Typology Code available for payment source | Admitting: Family Medicine

## 2018-11-30 ENCOUNTER — Encounter: Payer: Self-pay | Admitting: Family Medicine

## 2018-11-30 VITALS — BP 128/72 | Temp 98.2°F

## 2018-11-30 DIAGNOSIS — I10 Essential (primary) hypertension: Secondary | ICD-10-CM | POA: Diagnosis not present

## 2018-11-30 DIAGNOSIS — E039 Hypothyroidism, unspecified: Secondary | ICD-10-CM

## 2018-11-30 DIAGNOSIS — I2510 Atherosclerotic heart disease of native coronary artery without angina pectoris: Secondary | ICD-10-CM

## 2018-11-30 DIAGNOSIS — E119 Type 2 diabetes mellitus without complications: Secondary | ICD-10-CM

## 2018-11-30 DIAGNOSIS — E785 Hyperlipidemia, unspecified: Secondary | ICD-10-CM | POA: Diagnosis not present

## 2018-11-30 NOTE — Progress Notes (Signed)
Phone [phone]   Subjective:  Virtual visit via Video note  Our team/I connected with Gabriel [name] on 11/30/18 at  3:20 PM EDT by a video enabled telemedicine application (webex) and verified that I am speaking with the correct person using two identifiers.  Location patient: Home Location provider: [facility], office Persons participating in the virtual visit: patient  Our team/I discussed the limitations of evaluation and management by telemedicine and the availability of in person appointments. In light of current covid-19 pandemic, patient also understands that we are trying to protect them by minimizing in office contact if at all possible.  The patient expressed consent for telemedicine visit and agreed to proceed.   ROS- No chest pain or shortness of breath. No headache. 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/14/2013    Priority: Medium   Morbid obesity ([place]) 08/10/2013    Priority: Medium   HTN (hypertension) 02/06/2011    Priority: Medium   Hypothyroidism 02/06/2011    Priority: Medium   Hyperlipidemia 02/06/2011    Priority: Medium   GERD (gastroesophageal reflux disease) 02/01/2015    Priority: Low   Dysphagia 09/30/2013    Priority: Low   Colon cancer screening 10/27/2018   Midepigastric pain 09/27/2018   Change in bowel habits 09/27/2018   Family hx colonic polyps 09/27/2018   BRBPR (bright red blood per rectum) 09/27/2018   Left lateral epicondylitis 03/18/2016    Medications- reviewed and updated Current Outpatient Medications  Medication Sig Dispense Refill   acetaminophen (TYLENOL) 500 MG tablet Take 1,000 mg by mouth [name] 6 (six) hours as needed for mild  pain or moderate pain. Reported on 01/09/2016     aspirin EC 81 MG EC tablet Take 1 tablet (81 mg total) by mouth daily.     glimepiride (AMARYL) 4 MG tablet Take 1 tablet (4 mg total) by mouth daily before breakfast. 30 tablet 3   glucose blood (ONE TOUCH TEST STRIPS) test strip Up to 4 times daily.  Onetouch Verio.  E11.65 100 each 12   Lancets (ONETOUCH ULTRASOFT) lancets Up to 4 times daily.  Onetouch Verio.  DxE11.65 100 each 12   levothyroxine (SYNTHROID, LEVOTHROID) 100 MCG tablet TAKE 1 TABLET BY MOUTH ONCE DAILY 90 tablet 0   losartan (COZAAR) 100 MG tablet TAKE 1 TABLET BY MOUTH ONCE DAILY 90 tablet 0   metFORMIN (GLUCOPHAGE) 1000 MG tablet TAKE 1 TABLET BY MOUTH TWICE DAILY WITH MEALS 180 tablet 3   Multiple Vitamins-Minerals (MULTIVITAMIN,TX-MINERALS) tablet Take 1 tablet by mouth daily.       nitroGLYCERIN (NITROSTAT) 0.4 MG SL tablet Place 1 tablet (0.4 mg total) under the tongue [name] 5 (five) minutes as needed for chest pain (CP or SOB). 25 tablet 3   omeprazole (PRILOSEC) 40 MG capsule Take 1 capsule (40 mg total) by mouth 2 (two) times daily. (Patient taking differently: Take 40 mg by mouth daily. ) 60 capsule 1   simvastatin (ZOCOR) 40 MG tablet TAKE 1 TABLET BY MOUTH AT BEDTIME 90 tablet 1   No current facility-administered medications for this visit.      Objective:  BP 128/72    Temp 98.2 F (36.8 C)  Gen: NAD, resting comfortably No respiratory distress noted on phone visit No rash on arms or face    Assessment and Plan   # Diabetes S: may have worsening control with recent increase in CBGs but previously controlled on glimepiride 4 mg, metformin 100mg  BID.  CBGs- running 140-150 recently- had gotten as low as 100 but hasnt been able to work out for the last month and that has set him back- trying to cut back with virus. Having to lay low with virus to protect dad. Didn't see much weight loss with this.  Lab Results  Component Value Date   HGBA1C 6.7 (H)  07/06/2018   HGBA1C 6.7 (A) 03/25/2018   HGBA1C 6.2 06/27/2017  A/P: likely poor control- get a1c in 3 months (hopeful in better position with covid 19 - discussed since gym isnt an option- encouraged him to get out and walk.  - discussed indoor walking or using bike on porch  #hypertension S: controlled on  losartan 100mg  BP Readings from Last 3 Encounters:  11/30/18 128/72- on home check  10/22/18 128/72  09/25/18 124/78  A/P: Stable. Continue current medications.   #hyperlipidemia/CAD S: reasonably controlled on simvastatin 40mg . Also compliant on aspirin Lab Results  Component Value Date   CHOL 129 07/06/2018   HDL 36.10 (L) 07/06/2018   LDLCALC 65 07/06/2018   LDLDIRECT 190.5 12/07/2010   TRIG 140.0 07/06/2018   CHOLHDL 4 07/06/2018   A/P: reasonably controlled- continue current meds. Thankful no chest pain  #hypothyroidism S: On thyroid medication-100 mcg levothyroxine  Lab Results  Component Value Date   TSH 3.90 09/16/2018   A/P: Stable. Continue current medications.  Update TSH next visit  Other notes: 1. advised updated colonoscopy 2. Advised 3 month follow up and went ahead and ordered labs  Lab/Order associations: Essential hypertension  Diabetes mellitus without complication ([place]) - Plan: Hemoglobin A1c, CBC, Comprehensive metabolic panel  Hyperlipidemia, unspecified hyperlipidemia type - Plan: Hemoglobin A1c, CBC, Comprehensive metabolic panel, LDL cholesterol, direct  Atherosclerosis of native coronary artery of native heart without angina pectoris - Plan: LDL cholesterol, direct  Hypothyroidism, unspecified type - Plan: TSH  Return precautions advised.  Garret Reddish, MD

## 2018-11-30 NOTE — Patient Instructions (Signed)
Video visit He is going to increase exercise and schedule 3 month visit with [place].  He is going to get updated eye exam and get colonoscopy soon

## 2018-12-01 ENCOUNTER — Telehealth: Payer: Self-pay | Admitting: Family Medicine

## 2018-12-01 ENCOUNTER — Other Ambulatory Visit: Payer: Self-pay

## 2018-12-01 MED ORDER — GLIMEPIRIDE 4 MG PO TABS: 4.0000 mg | ORAL_TABLET | Freq: Every day | ORAL | 0 refills | Status: DC

## 2018-12-01 NOTE — Telephone Encounter (Signed)
Chart says Glimepiride 4 mg. Called pt to see what does he is currently taking and he advised that his rx bottle says 2 mg. He changes rx bottle when he gets them so he isn't sure if he has been taking 2 mg or 4 mg.   Forwarding to Dr. Yong Channel, send rx for 2 mg or 4 mg?

## 2018-12-01 NOTE — Telephone Encounter (Signed)
Rx sent in for Glimepiride 4 mg.   Called pt and left VM to call the office.

## 2018-12-01 NOTE — Telephone Encounter (Signed)
Lets send in 4 mg- if patient has any sugars below 80 on this let me know and we can go back to 2 mg.

## 2018-12-02 NOTE — Telephone Encounter (Signed)
Pt returned call and was advised. F/u OV has been schedule for 03/09/2019.

## 2019-01-07 ENCOUNTER — Telehealth: Payer: Self-pay | Admitting: Cardiology

## 2019-01-07 NOTE — Telephone Encounter (Signed)
Virtual Visit Pre-Appointment Phone Call  "(Name), I am calling you today to discuss your upcoming appointment. We are currently trying to limit exposure to the virus that causes COVID-19 by seeing patients at home rather than in the office."  1. "What is the BEST phone number to call the day of the visit?" - include this in appointment notes  2. Do you have or have access to (through a family member/friend) a smartphone with video capability that we can use for your visit?" a. If yes - list this number in appt notes as cell (if different from BEST phone #) and list the appointment type as a VIDEO visit in appointment notes b. If no - list the appointment type as a PHONE visit in appointment notes  3. Confirm consent - "In the setting of the current Covid19 crisis, you are scheduled for a (phone or video) visit with your provider on (date) at (time).  Just as we do with many in-office visits, in order for you to participate in this visit, we must obtain consent.  If you'd like, I can send this to your mychart (if signed up) or email for you to review.  Otherwise, I can obtain your verbal consent now.  All virtual visits are billed to your insurance company just like a normal visit would be.  By agreeing to a virtual visit, we'd like you to understand that the technology does not allow for your provider to perform an examination, and thus may limit your provider's ability to fully assess your condition. If your provider identifies any concerns that need to be evaluated in person, we will make arrangements to do so.  Finally, though the technology is pretty good, we cannot assure that it will always work on either your or our end, and in the setting of a video visit, we may have to convert it to a phone-only visit.  In either situation, we cannot ensure that we have a secure connection.  Are you willing to proceed?" STAFF: Did the patient verbally acknowledge consent to telehealth visit? Document  YES/NO here: YES  4. Advise patient to be prepared - "Two hours prior to your appointment, go ahead and check your blood pressure, pulse, oxygen saturation, and your weight (if you have the equipment to check those) and write them all down. When your visit starts, your provider will ask you for this information. If you have an Apple Watch or Kardia device, please plan to have heart rate information ready on the day of your appointment. Please have a pen and paper handy nearby the day of the visit as well."  5. Give patient instructions for MyChart download to smartphone OR Doximity/Doxy.me as below if video visit (depending on what platform provider is using)  6. Inform patient they will receive a phone call 15 minutes prior to their appointment time (may be from unknown caller ID) so they should be prepared to answer    TELEPHONE CALL NOTE  Gabriel [name] has been deemed a candidate for a follow-up tele-health visit to limit community exposure during the Covid-19 pandemic. I spoke with the patient via phone to ensure availability of phone/video source, confirm preferred email & phone number, and discuss instructions and expectations.  I reminded Gabriel [name] to be prepared with any vital sign and/or heart rhythm information that could potentially be obtained via home monitoring, at the time of his visit. I reminded Gabriel [name] to expect a phone call prior to  his visit.  Gabriel [name] 01/07/2019 12:05 PM   INSTRUCTIONS FOR DOWNLOADING THE MYCHART APP TO SMARTPHONE  - The patient must first make sure to have activated MyChart and know their login information - If Apple, go to [employer] and type in MyChart in the search bar and download the app. If Android, ask patient to go to [employer] and type in [facility] in the search bar and download the app. The app is free but as with any other app downloads, their phone may require them to verify saved payment information or  Apple/Android password.  - The patient will need to then log into the app with their MyChart username and password, and select [facility] as their healthcare provider to link the account. When it is time for your visit, go to the MyChart app, find appointments, and click Begin Video Visit. Be sure to Select Allow for your device to access the Microphone and Camera for your visit. You will then be connected, and your provider will be with you shortly.  **If they have any issues connecting, or need assistance please contact MyChart service desk [phone])**  **If using a computer, in order to ensure the best quality for their visit they will need to use either of the following Internet Browsers: [employer], or Google Chrome**  IF USING DOXIMITY or DOXY.ME - The patient will receive a link just prior to their visit by text.     FULL LENGTH CONSENT FOR TELE-HEALTH VISIT   I hereby voluntarily request, consent and authorize [facility] and its employed or contracted physicians, physician assistants, nurse practitioners or other licensed health care professionals (the Practitioner), to provide me with telemedicine health care services (the Services") as deemed necessary by the treating Practitioner. I acknowledge and consent to receive the Services by the Practitioner via telemedicine. I understand that the telemedicine visit will involve communicating with the Practitioner through live audiovisual communication technology and the disclosure of certain medical information by electronic transmission. I acknowledge that I have been given the opportunity to request an in-person assessment or other available alternative prior to the telemedicine visit and am voluntarily participating in the telemedicine visit.  I understand that I have the right to withhold or withdraw my consent to the use of telemedicine in the course of my care at any time, without affecting my right to future care  or treatment, and that the Practitioner or I may terminate the telemedicine visit at any time. I understand that I have the right to inspect all information obtained and/or recorded in the course of the telemedicine visit and may receive copies of available information for a reasonable fee.  I understand that some of the potential risks of receiving the Services via telemedicine include:   Delay or interruption in medical evaluation due to technological equipment failure or disruption;  Information transmitted may not be sufficient (e.g. poor resolution of images) to allow for appropriate medical decision making by the Practitioner; and/or   In rare instances, security protocols could fail, causing a breach of personal health information.  Furthermore, I acknowledge that it is my responsibility to provide information about my medical history, conditions and care that is complete and accurate to the best of my ability. I acknowledge that Practitioner's advice, recommendations, and/or decision may be based on factors not within their control, such as incomplete or inaccurate data provided by me or distortions of diagnostic images or specimens that may result from electronic transmissions. I  understand that the practice of medicine is not an exact science and that Practitioner makes no warranties or guarantees regarding treatment outcomes. I acknowledge that I will receive a copy of this consent concurrently upon execution via email to the email address I last provided but may also request a printed copy by calling the office of [facility].    I understand that my insurance will be billed for this visit.   I have read or had this consent read to me.  I understand the contents of this consent, which adequately explains the benefits and risks of the Services being provided via telemedicine.   I have been provided ample opportunity to ask questions regarding this consent and the Services and have had  my questions answered to my satisfaction.  I give my informed consent for the services to be provided through the use of telemedicine in my medical care  By participating in this telemedicine visit I agree to the above.

## 2019-01-17 NOTE — Progress Notes (Signed)
Virtual Visit via Telephone Note   This visit type was conducted due to national recommendations for restrictions regarding the COVID-19 Pandemic (e.g. social distancing) in an effort to limit this patient's exposure and mitigate transmission in our community.  Due to his co-morbid illnesses, this patient is at least at moderate risk for complications without adequate follow up.  This format is felt to be most appropriate for this patient at this time.  The patient did not have access to video technology/had technical difficulties with video requiring transitioning to audio format only (telephone).  All issues noted in this document were discussed and addressed.  No physical exam could be performed with this format.  Please refer to the patient's chart for his  consent to telehealth for [facility].   Date:  01/18/2019   ID:  Gabriel [name], DOB Feb 02, 1966, MRN [id number]  Patient Location: Home Provider Location: Home  PCP:  Marin Olp, MD  Cardiologist:  Minus Breeding, MD  Electrophysiologist:  None   Evaluation Performed:  New Patient Evaluation  Chief Complaint:  Chest pain.    History of Present Illness:    Gabriel [name] is a 53 y.o. male  returns for follow up of CAD which is being managed medically.   He has a history of an LAD stenosis of 50% demonstrated in December 2014. He had a normal  FFR at that time.  He was in the emergency room however in 2017 with chest discomfort. He was seen in consultation. It was felt to be somewhat atypical pain. He had some chest discomfort with burping. It did not respond to treatment with an antacid. His cardiac markers were negative in the ER. He did report having used nitroglycerin about one month prior to this. He had a stress perfusion study which demonstrated an EF of 56%. There was a medium-sized moderate defect in the basal inferoseptal mid anteroseptal and mid inferoseptal apical region.  He had a cardiac cath on 09/28/15 and  was found to have 40% LAD stenosis.    It has been greater than 3 years since I last saw him.  He has had significant problems with reflux.  Patient is managed by GI.  He says he is continuing to get burning discomfort periodically.  However, this it does not happen when he walks on a treadmill.  He has been able to do that kind of exercise up until the point when they closed the gyms.  He does not describe substernal discomfort.  He does not describe substernal discomfort.  It is more of an epigastric discomfort.  It is a burning discomfort he denies any vomiting.  He has no shortness of breath, PND or orthopnea.  He thinks it similar to what he had back when he had his catheterization.  At that point he had nonobstructive disease as described.  The patient does not have symptoms concerning for COVID-19 infection (fever, chills, cough, or new shortness of breath).    Past Medical History:  Diagnosis Date   Arthritis    "joints; from where I've had surgeries" (08/17/2013)   Chest pain    a. 08/2013 Cardia CTA: Ca score 238 (95%), LM nl, LAD mod stenosis in D2 area, D1 mild ost stenosis, D2 no signif dzs, LCX small, RCA nl.   Coronary artery disease    LHC (08/18/13):  pLAD 50, CFX and RCA normal.  EF 55-65%.  LAD lesion FFR:  0.88 (normal).  Med rx recommended.  Dysphagia    Upper endoscopy 11/2013 - without obvious stricture s/p Maloney dilation.    GERD (gastroesophageal reflux disease)    Hyperlipidemia    a. Dx 3y ago - not on statin.   Hypertension    a. Dx 3y ago.   Hypothyroidism    a. on replacement.   IBS (irritable bowel syndrome)    colonoscopy 2000   Obesity    Sleep apnea    does not use CPAP   Snoring    Type II diabetes mellitus ([place])    Past Surgical History:  Procedure Laterality Date   ANKLE SURGERY Right    "had a growth in it; cut it out" (08/17/2013)   CARDIAC CATHETERIZATION N/A 09/28/2015   Procedure: Left Heart Cath and Coronary Angiography;   Surgeon: Peter M Martinique, MD;  Location: [facility];  Service: Cardiovascular;  Laterality: N/A;   CHOLECYSTECTOMY     KNEE ARTHROSCOPY Right X 2   LEFT HEART CATHETERIZATION WITH CORONARY ANGIOGRAM N/A 08/18/2013   Procedure: LEFT HEART CATHETERIZATION WITH CORONARY ANGIOGRAM;  Surgeon: Peter M Martinique, MD;  [facility] CATH [facility];  Service: Cardiovascular;  Laterality: N/A;   SHOULDER ARTHROSCOPY W/ ROTATOR CUFF REPAIR Left      Current Meds  Medication Sig   acetaminophen (TYLENOL) 500 MG tablet Take 1,000 mg by mouth every 6 (six) hours as needed for mild pain or moderate pain. Reported on 01/09/2016   aspirin EC 81 MG EC tablet Take 1 tablet (81 mg total) by mouth daily.   glimepiride (AMARYL) 4 MG tablet Take 1 tablet (4 mg total) by mouth daily before breakfast.   glucose blood (ONE TOUCH TEST STRIPS) test strip Up to 4 times daily.  Onetouch Verio.  E11.65   Lancets (ONETOUCH ULTRASOFT) lancets Up to 4 times daily.  Onetouch Verio.  DxE11.65   levothyroxine (SYNTHROID, LEVOTHROID) 100 MCG tablet TAKE 1 TABLET BY MOUTH ONCE DAILY   losartan (COZAAR) 100 MG tablet TAKE 1 TABLET BY MOUTH ONCE DAILY   metFORMIN (GLUCOPHAGE) 1000 MG tablet TAKE 1 TABLET BY MOUTH TWICE DAILY WITH MEALS   Multiple Vitamins-Minerals (MULTIVITAMIN,TX-MINERALS) tablet Take 1 tablet by mouth daily.     nitroGLYCERIN (NITROSTAT) 0.4 MG SL tablet Place 1 tablet (0.4 mg total) under the tongue every 5 (five) minutes as needed for chest pain (CP or SOB).   simvastatin (ZOCOR) 40 MG tablet TAKE 1 TABLET BY MOUTH AT BEDTIME   [DISCONTINUED] omeprazole (PRILOSEC) 40 MG capsule Take 1 capsule (40 mg total) by mouth 2 (two) times daily. (Patient taking differently: Take 40 mg by mouth daily. )     Allergies:   Patient has no known allergies.   Social History   Tobacco Use   Smoking status: Former Smoker    Packs/day: 1.50    Years: 24.00    Pack years: 36.00    Types: Cigarettes    Last  attempt to quit: 09/10/2007    Years since quitting: 11.3   Smokeless tobacco: Never Used  Substance Use Topics   Alcohol use: No   Drug use: No     Family Hx: The patient's family history includes Asthma in his maternal grandfather; Coronary artery disease in his father; Diabetes in his brother, father, and mother; Hypertension in his father and mother; Obesity in his brother and mother; Stroke in his father. There is no history of Colon cancer, Esophageal cancer, Inflammatory bowel disease, Liver disease, Pancreatic cancer, Rectal cancer, or Stomach cancer. He  was adopted.  ROS:   Please see the history of present illness.    As stated in the HPI and negative for all other systems.   Prior CV studies:   The following studies were reviewed today:  Cath  Labs/Other Tests and Data Reviewed:    EKG:  NSR, rate 77, LAD, no acute ST T wave changes  Recent Labs: 09/16/2018: BUN 10; Creatinine, Ser 0.95; Hemoglobin 14.3; Platelets 238.0; Potassium 4.1; Sodium 138; TSH 3.90 09/25/2018: ALT 47   Recent Lipid Panel Lab Results  Component Value Date/Time   CHOL 129 07/06/2018 11:12 AM   TRIG 140.0 07/06/2018 11:12 AM   HDL 36.10 (L) 07/06/2018 11:12 AM   CHOLHDL 4 07/06/2018 11:12 AM   LDLCALC 65 07/06/2018 11:12 AM   LDLDIRECT 190.5 12/07/2010 09:38 AM    Wt Readings from Last 3 Encounters:  01/18/19 (!) 314 lb (142.4 kg)  10/22/18 (!) 319 lb (144.7 kg)  09/25/18 (!) 316 lb (143.3 kg)    Lab Results  Component Value Date   HGBA1C 6.7 (H) 07/06/2018    Objective:    Vital Signs:  Ht 5\' 9"  (1.753 m)    Wt (!) 314 lb (142.4 kg)    BMI 46.37 kg/m    VITAL SIGNS:  reviewed GEN:  no acute distress EYES:  sclerae anicteric, EOMI - Extraocular Movements Intact NEURO:  alert and oriented x 3, no obvious focal deficit PSYCH:  normal affect  ASSESSMENT & PLAN:    CAD:   He does have some continued chest discomfort.  Somewhat atypical. I will bring the patient back for a POET  (Plain Old Exercise Test). This will allow me to screen for obstructive coronary disease, risk stratify and very importantly provide a prescription for exercise.  GERD:    He continues to follow with GI.  Continue current therapy.   Snoring:    He has sleep apnea but could not afford CPAP.   Hypertension:  The blood pressure is borderline.  We talked about weight loss and exercise.  I don't think that he needs a change to his meds.   Hyperlipidemia:    He will continue meds as listed.   Diabetes Mellitus:    This is well controlled as above.   Continue meds as listed.    Obesity:     We discussed weight loss strategies.    COVID-19 Education: The signs and symptoms of COVID-19 were discussed with the patient and how to seek care for testing (follow up with PCP or arrange E-visit).  The importance of social distancing was discussed today.  Time:   Today, I have spent 25 minutes with the patient with telehealth technology discussing the above problems.     Medication Adjustments/Labs and Tests Ordered: Current medicines are reviewed at length with the patient today.  Concerns regarding medicines are outlined above.   Tests Ordered: No orders of the defined types were placed in this encounter.   Medication Changes: No orders of the defined types were placed in this encounter.   Disposition:  Follow up with me based on the results of the stress test.   Signed, Minus Breeding, MD  01/18/2019 12:12 PM    [facility]

## 2019-01-18 ENCOUNTER — Encounter: Payer: Self-pay | Admitting: *Deleted

## 2019-01-18 ENCOUNTER — Telehealth (INDEPENDENT_AMBULATORY_CARE_PROVIDER_SITE_OTHER): Payer: No Typology Code available for payment source | Admitting: Cardiology

## 2019-01-18 ENCOUNTER — Encounter: Payer: Self-pay | Admitting: Cardiology

## 2019-01-18 VITALS — BP 143/74 | Ht 69.0 in | Wt 314.0 lb

## 2019-01-18 DIAGNOSIS — I2510 Atherosclerotic heart disease of native coronary artery without angina pectoris: Secondary | ICD-10-CM | POA: Diagnosis not present

## 2019-01-18 DIAGNOSIS — Z7189 Other specified counseling: Secondary | ICD-10-CM | POA: Insufficient documentation

## 2019-01-18 DIAGNOSIS — I10 Essential (primary) hypertension: Secondary | ICD-10-CM

## 2019-01-18 NOTE — Patient Instructions (Addendum)
Your physician wants you to follow-up in: 2 YEARS WITH DR Lilburn will receive a reminder letter in the mail two months in advance. If you don't receive a letter, please call our office to schedule the follow-up appointment.  Your physician recommends that you continue on your current medications as directed. Please refer to the Current Medication list given to you today.  Your physician has requested that you have an exercise tolerance test. For further information please visit [email or web address]. Please also follow instruction sheet, as given.  Thank you for choosing [facility]!!

## 2019-01-25 ENCOUNTER — Telehealth: Payer: Self-pay | Admitting: Family Medicine

## 2019-01-25 MED ORDER — TRIAMCINOLONE ACETONIDE 0.1 % EX CREA: 1.0000 "application " | TOPICAL_CREAM | Freq: Two times a day (BID) | CUTANEOUS | 0 refills | Status: DC

## 2019-01-25 NOTE — Addendum Note (Signed)
Addended by: Marin Olp on: 01/25/2019 08:07 PM   Modules accepted: Orders

## 2019-01-25 NOTE — Telephone Encounter (Signed)
See note  Copied from [facility] [id number]. Topic: General - Inquiry >> Jan 25, 2019  9:50 AM Gabriel [name], NT wrote: Reason for CRM: Patient is calling in stating he would like some medication for his poison oak. States he had an appointment 3 weeks ago. Call back is [phone].

## 2019-01-25 NOTE — Telephone Encounter (Signed)
Pt had rash for a few weeks but cannot recall if this was discussed with Dr Yong Channel at his last visit. He has tried Hydrocortisone cream with no relief. He has a rash on the front of his legs that is very itchy. He has noticed a couple of spots on his arms. The rash seems to be spreading. He denies drainage. Denies fever, HA. He says that Dr. Yong Channel has treated him for this before over the past couple of years.   He has Prednisone that is prescribed for his dog and he was wondering if he could take that because the rash and itching is getting bad. I told him that I could not recommend that and that I would check with Dr. Yong Channel to see if he can send something in or if he needs an appointment.

## 2019-01-26 NOTE — Telephone Encounter (Signed)
Called pt and advised. He will contact the office if he gets inadequate refill from cream or if any new or worsening symptoms.

## 2019-01-26 NOTE — Telephone Encounter (Signed)
Left message to return phone call.

## 2019-01-26 NOTE — Telephone Encounter (Signed)
See note

## 2019-01-26 NOTE — Telephone Encounter (Signed)
Patient returned a call to Tillie Rung and is waiting to be called back at Ph# [phone]

## 2019-01-28 ENCOUNTER — Other Ambulatory Visit: Payer: Self-pay | Admitting: Family Medicine

## 2019-02-12 ENCOUNTER — Other Ambulatory Visit (HOSPITAL_COMMUNITY): Payer: Self-pay | Admitting: *Deleted

## 2019-02-21 ENCOUNTER — Other Ambulatory Visit: Payer: Self-pay | Admitting: Family Medicine

## 2019-02-24 ENCOUNTER — Other Ambulatory Visit: Payer: Self-pay | Admitting: Family Medicine

## 2019-03-09 ENCOUNTER — Ambulatory Visit (INDEPENDENT_AMBULATORY_CARE_PROVIDER_SITE_OTHER): Payer: No Typology Code available for payment source | Admitting: Family Medicine

## 2019-03-09 ENCOUNTER — Encounter: Payer: Self-pay | Admitting: Family Medicine

## 2019-03-09 VITALS — BP 107/79 | HR 94 | Temp 97.8°F | Ht 69.0 in | Wt 310.0 lb

## 2019-03-09 DIAGNOSIS — E1169 Type 2 diabetes mellitus with other specified complication: Secondary | ICD-10-CM

## 2019-03-09 DIAGNOSIS — I2510 Atherosclerotic heart disease of native coronary artery without angina pectoris: Secondary | ICD-10-CM

## 2019-03-09 DIAGNOSIS — E1159 Type 2 diabetes mellitus with other circulatory complications: Secondary | ICD-10-CM | POA: Diagnosis not present

## 2019-03-09 DIAGNOSIS — E039 Hypothyroidism, unspecified: Secondary | ICD-10-CM

## 2019-03-09 DIAGNOSIS — I10 Essential (primary) hypertension: Secondary | ICD-10-CM

## 2019-03-09 DIAGNOSIS — E119 Type 2 diabetes mellitus without complications: Secondary | ICD-10-CM

## 2019-03-09 DIAGNOSIS — Z79899 Other long term (current) drug therapy: Secondary | ICD-10-CM

## 2019-03-09 DIAGNOSIS — E785 Hyperlipidemia, unspecified: Secondary | ICD-10-CM

## 2019-03-09 MED ORDER — NITROGLYCERIN 0.4 MG SL SUBL: 0.4000 mg | SUBLINGUAL_TABLET | SUBLINGUAL | 3 refills | Status: DC | PRN

## 2019-03-09 MED ORDER — METFORMIN HCL 1000 MG PO TABS: 1000.0000 mg | ORAL_TABLET | Freq: Two times a day (BID) | ORAL | 3 refills | Status: DC

## 2019-03-09 MED ORDER — SIMVASTATIN 40 MG PO TABS: 40.0000 mg | ORAL_TABLET | Freq: Every day | ORAL | 3 refills | Status: DC

## 2019-03-09 NOTE — Progress Notes (Signed)
Phone [phone]   Subjective:  Virtual visit via Video note. Chief complaint: Chief Complaint  Patient presents with  . Follow-up  . Diabetes  . Hypertension  . Hyperlipidemia  . Hypothyroidism  . Gastroesophageal Reflux   This visit type was conducted due to national recommendations for restrictions regarding the COVID-19 Pandemic (e.g. social distancing).  This format is felt to be most appropriate for this patient at this time balancing risks to patient and risks to population by having him in for in person visit.  No physical exam was performed (except for noted visual exam or audio findings with Telehealth visits).    Our team/I connected with Gabriel [name] at  1:20 PM EDT by a video enabled telemedicine application (doxy.me or caregility through epic) and verified that I am speaking with the correct person using two identifiers.  Location patient: Home-O[facility] Location provider: [facility], office Persons participating in the virtual visit:  patient  Our team/I discussed the limitations of evaluation and management by telemedicine and the availability of in person appointments. In light of current covid-19 pandemic, patient also understands that we are trying to protect them by minimizing in office contact if at all possible.  The patient expressed consent for telemedicine visit and agreed to proceed. Patient understands insurance will be billed.   ROS- No chest pain or shortness of breath. No headache or blurry vision. Mild edema in ankles with travel but goes back down.   covid 19 screen- No fever, chills, cough, congestion, runny nose, shortness of breath, fatigue, body aches, sore throat, headache, nausea, vomiting, diarrhea, or new loss of taste or smell. No known contacts with covid 19 or someone being tested for covid 19.   Past Medical History-  Patient Active Problem List   Diagnosis Date Noted  . Coronary Artery Disease 08/26/2013    Priority: High  . Diabetes  mellitus without complication ([place]) 40/04/6760    Priority: High  . 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 Morbid obesity ([place]) 08/10/2013    Priority: Medium  . Hypertension associated with diabetes ([place]) 02/06/2011    Priority: Medium  . Hypothyroidism 02/06/2011    Priority: Medium  . Hyperlipidemia associated with type 2 diabetes mellitus ([place]) 02/06/2011    Priority: Medium  . GERD (gastroesophageal reflux disease) 02/01/2015    Priority: Low  . Dysphagia 09/30/2013    Priority: Low  . Educated About Covid-19 Virus Infection 01/18/2019  . Colon cancer screening 10/27/2018  . Midepigastric pain 09/27/2018  . Change in bowel habits 09/27/2018  . Family hx colonic polyps 09/27/2018  . BRBPR (bright red blood per rectum) 09/27/2018  . Left lateral epicondylitis 03/18/2016    Medications- reviewed and updated Current Outpatient Medications  Medication Sig Dispense Refill  . acetaminophen (TYLENOL) 500 MG tablet Take 1,000 mg by mouth [name] 6 (six) hours as needed for mild pain or moderate pain. Reported on 01/09/2016    . aspirin EC 81 MG EC tablet Take 1 tablet (81 mg total) by mouth daily.    [name] glimepiride (AMARYL) 4 MG tablet Take 1 tablet (4 mg total) by mouth daily before breakfast. 90 tablet 0  . glucose blood (ONE TOUCH TEST STRIPS) test strip Up to 4 times daily.  Onetouch Verio.  E11.65 100 each 12  . Lancets (ONETOUCH ULTRASOFT) lancets Up to 4 times daily.  Onetouch Verio.  DxE11.65 100 each 12  . levothyroxine (SYNTHROID) 100 MCG tablet Take 1 tablet by  mouth once daily 90 tablet 0  . losartan (COZAAR) 100 MG tablet Take 1 tablet by mouth once daily 90 tablet 0  . metFORMIN (GLUCOPHAGE) 1000 MG tablet Take 1 tablet (1,000 mg total) by mouth 2 (two) times daily with a meal. 180 tablet 3  . Multiple Vitamins-Minerals (MULTIVITAMIN,TX-MINERALS) tablet Take 1 tablet by mouth daily.      . nitroGLYCERIN (NITROSTAT) 0.4 MG SL tablet Place 1 tablet (0.4  mg total) under the tongue [name] 5 (five) minutes as needed for chest pain (CP or SOB). 25 tablet 3  . simvastatin (ZOCOR) 40 MG tablet Take 1 tablet (40 mg total) by mouth at bedtime. 90 tablet 3  . triamcinolone cream (KENALOG) 0.1 % Apply 1 application topically 2 (two) times daily. For 7-10 days maximum 80 g 0   No current facility-administered medications for this visit.      Objective:  BP 107/79   Pulse 94   Temp 97.8 F (36.6 C)   Ht 5\' 9"  (1.753 m)   Wt (!) 310 lb (140.6 kg)   BMI 45.78 kg/m  self reported vitals Gen: NAD, resting comfortably Lungs: nonlabored, normal respiratory rate  Skin: appears dry, no obvious rash    Assessment and Plan   # Diabetes/obesity-morbid S:  controlled on Metformin 1000 mg BID and Glimepiride 4 mg daily. No side effects, no missed doses.  CBGs- FBG this morning was 139, typically it is a little higher first thing in the morning. Denies hypoglycemic episodes.  Exercise and diet- Tries to limit sugar and carb intake but isn't always successful, likes Pizza and Bread. Has been out of town, in [place], so eating habits have been slightly off. Gyms have been closed d/t pandemic but he has been staying active at home and while traveling.  Lab Results  Component Value Date   HGBA1C 6.7 (H) 07/06/2018   HGBA1C 6.7 (A) 03/25/2018   HGBA1C 6.2 06/27/2017   A/P: hopefully stable-will come by for updated A1c tomorrow -For morbid obesity- Encouraged need for healthy eating, regular exercise, weight loss.    #hypertension S: controlled on Losartan 100 mg daily. No side effects, no missed doses.  He c/o swelling in his legs and feet while traveling to [place]. He has been watching and limiting salt intake. He will occasionally add salt to foods, mostly when eating corn on the cobb.  BP Readings from Last 3 Encounters:  03/09/19 107/79  01/18/19 (!) 143/74  11/30/18 128/72  A/P: Stable. Continue current medications.   #hyperlipidemia/CAD S:  controlled  on Simvastatin 40 mg and Aspirin 81 mg daily. No missed doses, no side effects.   Asymptomatic from CAD- has not taken nitroglycerin recently Lab Results  Component Value Date   CHOL 129 07/06/2018   HDL 36.10 (L) 07/06/2018   LDLCALC 65 07/06/2018   LDLDIRECT 190.5 12/07/2010   TRIG 140.0 07/06/2018   CHOLHDL 4 07/06/2018   A/P: Stable CAD and hopefully stable hyperlipidemia. Continue current medications.  Will come by for updated LDL tomorrow  #hypothyroidism S: On thyroid medication-Levothyroxine 100 mcg daily.  No side effects, no missed doses.  Lab Results  Component Value Date   TSH 3.90 09/16/2018  A/P: Stable. Continue current medications.    #Colon cancer screening-did have stool cards last year but per Dr. Donneta Romberg note which I noted after our visit together-plan for colonoscopy in 2020- will need to follow-up on this next visit  4-6 month follow up advised Future Appointments  Date Time Provider  [place]  03/10/2019  2:15 PM [facility]    Lab/Order associations:   ICD-10-CM   1. High risk medication use  Z79.899 Vitamin B12  2. Morbid obesity (HCC) Chronic E66.01   3. Diabetes mellitus without complication ([place])  G53.6   4. Atherosclerosis of native coronary artery of native heart without angina pectoris  I25.10   5. Hypothyroidism, unspecified type  E03.9   6. Hypertension associated with diabetes ([place])  E11.59    I10   7. Hyperlipidemia associated with type 2 diabetes mellitus ([place])  E11.69    E78.5     Meds ordered this encounter  Medications  . nitroGLYCERIN (NITROSTAT) 0.4 MG SL tablet    Sig: Place 1 tablet (0.4 mg total) under the tongue [name] 5 (five) minutes as needed for chest pain (CP or SOB).    Dispense:  25 tablet    Refill:  3  . metFORMIN (GLUCOPHAGE) 1000 MG tablet    Sig: Take 1 tablet (1,000 mg total) by mouth 2 (two) times daily with a meal.    Dispense:  180 tablet    Refill:  3  . simvastatin (ZOCOR) 40 MG  tablet    Sig: Take 1 tablet (40 mg total) by mouth at bedtime.    Dispense:  90 tablet    Refill:  3    Return precautions advised.  Garret Reddish, MD

## 2019-03-09 NOTE — Patient Instructions (Addendum)
Health Maintenance Due  Topic Date Due  . [place]- needs updated exam- he will call to schedule 10/26/2016  . COLONOSCOPY - FOBT 08/2018 04/21/2017  . FOOT EXAM - due- next in person visit 06/27/2018  . HEMOGLOBIN A1C - due, future lab orders in 01/05/2019

## 2019-03-10 ENCOUNTER — Other Ambulatory Visit: Payer: Self-pay

## 2019-03-10 ENCOUNTER — Other Ambulatory Visit: Payer: No Typology Code available for payment source

## 2019-03-10 LAB — VITAMIN B12: Vitamin B-12: 389 pg/mL (ref 211–911)

## 2019-03-10 NOTE — Addendum Note (Signed)
Addended by: Francis Dowse T on: 03/10/2019 12:14 PM   Modules accepted: Orders

## 2019-03-11 ENCOUNTER — Other Ambulatory Visit (INDEPENDENT_AMBULATORY_CARE_PROVIDER_SITE_OTHER): Payer: No Typology Code available for payment source

## 2019-03-11 DIAGNOSIS — I2510 Atherosclerotic heart disease of native coronary artery without angina pectoris: Secondary | ICD-10-CM

## 2019-03-11 DIAGNOSIS — E119 Type 2 diabetes mellitus without complications: Secondary | ICD-10-CM

## 2019-03-11 DIAGNOSIS — E785 Hyperlipidemia, unspecified: Secondary | ICD-10-CM | POA: Diagnosis not present

## 2019-03-11 DIAGNOSIS — E039 Hypothyroidism, unspecified: Secondary | ICD-10-CM

## 2019-03-11 LAB — COMPREHENSIVE METABOLIC PANEL
ALT: 38 U/L (ref 0–53)
AST: 26 U/L (ref 0–37)
Albumin: 4.3 g/dL (ref 3.5–5.2)
Alkaline Phosphatase: 66 U/L (ref 39–117)
BUN: 10 mg/dL (ref 6–23)
CO2: 27 mEq/L (ref 19–32)
Calcium: 8.6 mg/dL (ref 8.4–10.5)
Chloride: 104 mEq/L (ref 96–112)
Creatinine, Ser: 0.92 mg/dL (ref 0.40–1.50)
GFR: 86.43 mL/min (ref >60.00)
Glucose, Bld: 112 mg/dL — ABNORMAL HIGH (ref 70–99)
Potassium: 4 mEq/L (ref 3.5–5.1)
Sodium: 140 mEq/L (ref 135–145)
Total Bilirubin: 1 mg/dL (ref 0.2–1.2)
Total Protein: 6.6 g/dL (ref 6.0–8.3)

## 2019-03-11 LAB — CBC
HCT: 42 % (ref 39.0–52.0)
Hemoglobin: 14 g/dL (ref 13.0–17.0)
MCHC: 33.4 g/dL (ref 30.0–36.0)
MCV: 86.8 fl (ref 78.0–100.0)
Platelets: 211 10*3/uL (ref 150.0–400.0)
RBC: 4.84 Mil/uL (ref 4.22–5.81)
RDW: 13.8 % (ref 11.5–15.5)
WBC: 5.3 10*3/uL (ref 4.0–10.5)

## 2019-03-11 LAB — TSH: TSH: 2.99 u[IU]/mL (ref 0.35–4.50)

## 2019-03-11 LAB — HEMOGLOBIN A1C: Hgb A1c MFr Bld: 6.8 % — ABNORMAL HIGH (ref 4.6–6.5)

## 2019-03-11 LAB — LDL CHOLESTEROL, DIRECT: Direct LDL: 68 mg/dL

## 2019-03-12 ENCOUNTER — Telehealth: Payer: Self-pay | Admitting: Physician Assistant

## 2019-03-15 ENCOUNTER — Telehealth: Payer: Self-pay | Admitting: Family Medicine

## 2019-03-15 ENCOUNTER — Other Ambulatory Visit: Payer: Self-pay

## 2019-03-15 MED ORDER — GLIMEPIRIDE 4 MG PO TABS: 4.0000 mg | ORAL_TABLET | Freq: Every day | ORAL | 0 refills | Status: DC

## 2019-03-15 NOTE — Telephone Encounter (Signed)
Pt calling back to check status. Pt would like call back from nurse. Please advise   [phone]

## 2019-03-15 NOTE — Telephone Encounter (Signed)
Pt calling to speak with nurse. Please advise   Copied from [place] [phone]. Topic: Quick Communication - Lab Results (Clinic Use ONLY) >> Mar 11, 2019  5:44 PM Jasper Loser, CMA wrote: Called patient to inform them of 03/09/19 lab results. When patient returns call, triage nurse may disclose results.

## 2019-03-15 NOTE — Telephone Encounter (Signed)
See note

## 2019-03-15 NOTE — Telephone Encounter (Signed)
Pt Rx has been sent to office.

## 2019-03-15 NOTE — Telephone Encounter (Signed)
Pt R has been sent to the pharmacy. Pt notified of update.

## 2019-03-22 ENCOUNTER — Telehealth (HOSPITAL_COMMUNITY): Payer: Self-pay

## 2019-03-22 NOTE — Telephone Encounter (Signed)
New message   Just an FYI. We have made several attempts to contact this patient including sending a letter to schedule or reschedule their Exercise Tolerance Test. We will be removing the patient from the [place].     7.13.20 @ 11:54am both # are the same - lm on home vm Katherinne Mofield    7.6.20 @ 11:[place] # are the same  lm on home vm Chantale Leugers  7.2.20 @ 2:32pm lm on home vm - Galia Rahm  patient would like to have test done at [facility]

## 2019-04-28 ENCOUNTER — Other Ambulatory Visit: Payer: Self-pay | Admitting: Family Medicine

## 2019-05-04 ENCOUNTER — Telehealth: Payer: Self-pay | Admitting: Family Medicine

## 2019-05-04 NOTE — Telephone Encounter (Signed)
[facility] now has switched to Euthyrox brand for the Pt's levothyroxine (SYNTHROID) 100 MCG tablet  And wanted to get the ok for this change/ please advise

## 2019-05-04 NOTE — Telephone Encounter (Signed)
See note

## 2019-05-04 NOTE — Telephone Encounter (Signed)
Yes thanks-may change and recheck TSH in 6 weeks under hypothyroidism  Lab Results  Component Value Date   HGBA1C 6.8 (H) 03/11/2019  If TSH date is after October 2-can also order A1c under his diabetes diagnosis

## 2019-05-04 NOTE — Telephone Encounter (Signed)
OK for mfg change? If so, when should TSH be rechecked?

## 2019-05-05 NOTE — Telephone Encounter (Signed)
Called pt and left VM to call the office.  

## 2019-05-06 NOTE — Telephone Encounter (Signed)
Called [facility] and informed them that it was okay to change Rx. Tried to call pt to inform of update but no answer.

## 2019-05-11 NOTE — Telephone Encounter (Signed)
Called pt and left VM to call the office.  

## 2019-05-19 ENCOUNTER — Telehealth: Payer: Self-pay | Admitting: *Deleted

## 2019-05-19 NOTE — Telephone Encounter (Signed)
Patient scheduled for 9/18 for COVID screening. He is aware of date, time and place.

## 2019-05-19 NOTE — Telephone Encounter (Signed)
-----   Message from Marcine Matar sent at 05/13/2019  4:13 PM EDT ----- Regarding: COVID test Good Day,  There is a stress echo scheduled on Tuesday, June 01, 2019, @ 9:45 am patient of Dr. Percival Spanish name Gabriel [name].  Please call the patient and schedule/order COVID testing  [facility] the patient is aware the nurse will be calling to set up COVID testing and is aware to self-isolate after the COVID testing is performed until the stress echo appointment.  If the patient does not have the COVID testing performed and if we don't have the testing results back prior to the stress echo appointment, we will have to cancel the test.  [name]

## 2019-05-21 ENCOUNTER — Other Ambulatory Visit (HOSPITAL_COMMUNITY): Payer: No Typology Code available for payment source

## 2019-05-26 ENCOUNTER — Ambulatory Visit (INDEPENDENT_AMBULATORY_CARE_PROVIDER_SITE_OTHER): Payer: No Typology Code available for payment source | Admitting: Physician Assistant

## 2019-05-26 ENCOUNTER — Encounter: Payer: Self-pay | Admitting: Physician Assistant

## 2019-05-26 VITALS — BP 122/68 | HR 102 | Temp 98.8°F | Ht 69.0 in | Wt 323.0 lb

## 2019-05-26 DIAGNOSIS — Z1211 Encounter for screening for malignant neoplasm of colon: Secondary | ICD-10-CM

## 2019-05-26 DIAGNOSIS — R1013 Epigastric pain: Secondary | ICD-10-CM

## 2019-05-26 MED ORDER — OMEPRAZOLE 20 MG PO CPDR: 20.0000 mg | DELAYED_RELEASE_CAPSULE | Freq: Every day | ORAL | 3 refills | Status: DC

## 2019-05-26 NOTE — Progress Notes (Signed)
Attending Physician's Attestation   I have reviewed the chart.   I agree with the Advanced Practitioner's note, impression, and recommendations with any updates as below.    Daziah Hesler Mansouraty, MD [facility] Advanced Endoscopy Office # [phone]  

## 2019-05-26 NOTE — Patient Instructions (Signed)
If you are age 52 or older, your body mass index should be between 23-30. Your Body mass index is 47.7 kg/m. If this is out of the aforementioned range listed, please consider follow up with your Primary Care Provider.  If you are age 36 or younger, your body mass index should be between 19-25. Your Body mass index is 47.7 kg/m. If this is out of the aformentioned range listed, please consider follow up with your Primary Care Provider.    We have sent the following medications to your pharmacy for you to pick up at your convenience: Omeprazole  It has been recommended to you by your physician that you have a(n) colon/endo completed. We did not schedule the procedure(s) today due to pending cardiac work-up. Please contact our office at [phone] once you have been cleared by cardiologist.  Please ask cardiologist to give clearance for endoscopies, once you have been cleared after your cardiac work-up.   Thank you for choosing me and [facility].  Dennison Bulla

## 2019-05-26 NOTE — Progress Notes (Signed)
Chief Complaint: GERD  HPI:    Gabriel [name] is a 53 year old Caucasian male with a past medical history of reflux and others listed below, known to Dr. Rush Landmark, who presents to clinic today with a complaint of reflux.    10/25/1998 Dr. Henrene Pastor.  Normal intrinsic colon and terminal ileum, no evidence of inflammatory bowel disease.  Intermittent bleeding secondary to internal hemorrhoids.  Lower abdominal symptoms due to irritable bowel syndrome.    10/22/2018 follow-up with Dr. Rush Landmark.  At that time his reflux had gone away completely with PPI therapy.  It was discussed that they could hold on EGD if his symptoms stayed away.  He was titrated off of his Omeprazole in July.  Was discussed that if he had exacerbation of symptoms and would recommend increasing back to patient's favorable dose and plan on upper endoscopy.  Also recommended he have a screening colonoscopy this year.    Per chart review it appears patient is having a cardiac work-up for some chest pain.  This is including an exercise tolerance test scheduled 06/01/2019 by Dr. Percival Spanish.    Today, the patient tells me that he weaned off of his Omeprazole back in July as discussed at his last appointment and almost immediately thereafter started with an epigastric pain which he describes radiating up into his chest with reflux at times.  He has been using over-the-counter Tums and other antacids but does not feel as though it is relieving this discomfort.  Patient briefly discussed his cardiac work-up as above.  He is aware he needs a colonoscopy.    Denies fever, chills, weight loss, anorexia, nausea, vomiting or symptoms that awaken him from sleep.  Past Medical History:  Diagnosis Date  . Arthritis    "joints; from where I've had surgeries" (08/17/2013)  . Chest pain    a. 08/2013 Cardia CTA: Ca score 238 (95%), LM nl, LAD mod stenosis in D2 area, D1 mild ost stenosis, D2 no signif dzs, LCX small, RCA nl.  . Coronary artery disease     LHC (08/18/13):  pLAD 50, CFX and RCA normal.  EF 55-65%.  LAD lesion FFR:  0.88 (normal).  Med rx recommended.    [name] Dysphagia    Upper endoscopy 11/2013 - without obvious stricture s/p Maloney dilation.   [name] GERD (gastroesophageal reflux disease)   . Hyperlipidemia    a. Dx 3y ago - not on statin.  [name] Hypertension    a. Dx 3y ago.  [name] Hypothyroidism    a. on replacement.  . IBS (irritable bowel syndrome)    colonoscopy 2000  . Obesity   . Sleep apnea    does not use CPAP  . Snoring   . Type II diabetes mellitus ([place])     Past Surgical History:  Procedure Laterality Date  . ANKLE SURGERY Right    "had a growth in it; cut it out" (08/17/2013)  . CARDIAC CATHETERIZATION N/A 09/28/2015   Procedure: Left Heart Cath and Coronary Angiography;  Surgeon: Peter M Martinique, MD;  Location: [place] CV LAB;  Service: Cardiovascular;  Laterality: N/A;  . CHOLECYSTECTOMY    . KNEE ARTHROSCOPY Right X 2  . LEFT HEART CATHETERIZATION WITH CORONARY ANGIOGRAM N/A 08/18/2013   Procedure: LEFT HEART CATHETERIZATION WITH CORONARY ANGIOGRAM;  Surgeon: Peter M Martinique, MD;  Location[facility] CATH LAB;  Service: Cardiovascular;  Laterality: N/A;  . SHOULDER ARTHROSCOPY W/ ROTATOR CUFF REPAIR Left     Current Outpatient Medications  Medication Sig Dispense  Refill  . acetaminophen (TYLENOL) 500 MG tablet Take 1,000 mg by mouth every 6 (six) hours as needed for mild pain or moderate pain. Reported on 01/09/2016    . aspirin EC 81 MG EC tablet Take 1 tablet (81 mg total) by mouth daily.    [name] glimepiride (AMARYL) 4 MG tablet Take 1 tablet (4 mg total) by mouth daily before breakfast. 90 tablet 0  . glucose blood (ONE TOUCH TEST STRIPS) test strip Up to 4 times daily.  Onetouch Verio.  E11.65 100 each 12  . Lancets (ONETOUCH ULTRASOFT) lancets Up to 4 times daily.  Onetouch Verio.  DxE11.65 100 each 12  . levothyroxine (SYNTHROID) 100 MCG tablet Take 1 tablet by mouth once daily 90 tablet 1  . losartan (COZAAR) 100  MG tablet Take 1 tablet by mouth once daily 90 tablet 1  . metFORMIN (GLUCOPHAGE) 1000 MG tablet Take 1 tablet (1,000 mg total) by mouth 2 (two) times daily with a meal. 180 tablet 3  . Multiple Vitamins-Minerals (MULTIVITAMIN,TX-MINERALS) tablet Take 1 tablet by mouth daily.      . nitroGLYCERIN (NITROSTAT) 0.4 MG SL tablet Place 1 tablet (0.4 mg total) under the tongue every 5 (five) minutes as needed for chest pain (CP or SOB). 25 tablet 3  . simvastatin (ZOCOR) 40 MG tablet Take 1 tablet (40 mg total) by mouth at bedtime. 90 tablet 3   No current facility-administered medications for this visit.     Allergies as of 05/26/2019  . (No Known Allergies)    Family History  Adopted: Yes  Problem Relation Age of Onset  . Hypertension Mother   . Obesity Mother   . Diabetes Mother   . Coronary artery disease Father        s/p MI in his early 30's->CABG x 5 @ age 31, currently 23.  . Diabetes Father   . Stroke Father   . Hypertension Father   . Diabetes Brother   . Obesity Brother   . Asthma Maternal Grandfather   . Colon cancer Neg Hx   . Esophageal cancer Neg Hx   . Inflammatory bowel disease Neg Hx   . Liver disease Neg Hx   . Pancreatic cancer Neg Hx   . Rectal cancer Neg Hx   . Stomach cancer Neg Hx     Social History   Socioeconomic History  . Marital status: Single    Spouse name: Not on file  . Number of children: 0  . Years of education: Not on file  . Highest education level: Not on file  Occupational History  . Occupation: Unemployed  Social Needs  . Financial resource strain: Not on file  . Food insecurity    Worry: Not on file    Inability: Not on file  . Transportation needs    Medical: Not on file    Non-medical: Not on file  Tobacco Use  . Smoking status: Former Smoker    Packs/day: 1.50    Years: 24.00    Pack years: 36.00    Types: Cigarettes    Quit date: 09/10/2007    Years since quitting: 11.7  . Smokeless tobacco: Never Used  Substance and  Sexual Activity  . Alcohol use: No  . Drug use: No  . Sexual activity: Yes  Lifestyle  . Physical activity    Days per week: Not on file    Minutes per session: Not on file  . Stress: Not on file  Relationships  .  Social Herbalist on phone: Not on file    Gets together: Not on file    Attends religious service: Not on file    Active member of club or organization: Not on file    Attends meetings of clubs or organizations: Not on file    Relationship status: Not on file  . Intimate partner violence    Fear of current or ex partner: Not on file    Emotionally abused: Not on file    Physically abused: Not on file    Forced sexual activity: Not on file  Other Topics Concern  . Not on file  Social History Narrative   Lives in [place] with parents (cares for parents).  Never married. Unemployed.     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[employer] (stops intermittently while caring for parents)    Review of Systems:    Constitutional: No weight loss, fever or chills Cardiovascular: +chest pain  Respiratory: No SOB  Gastrointestinal: See HPI and otherwise negative   Physical Exam:  Vital signs: BP 122/68   Pulse (!) 102   Temp 98.8 F (37.1 C)   Ht 5\' 9"  (1.753 m)   Wt (!) 323 lb (146.5 kg)   BMI 47.70 kg/m   Constitutional:   Pleasant obese Caucasian male appears to be in NAD, Well developed, Well nourished, alert and cooperative Respiratory: Respirations even and unlabored. Lungs clear to auscultation bilaterally.   No wheezes, crackles, or rhonchi.  Cardiovascular: Normal S1, S2. No MRG. Regular rate and rhythm. No peripheral edema, cyanosis or pallor.  Gastrointestinal:  Soft, nondistended,mild epigastric ttp. No rebound or guarding. Normal bowel sounds. No appreciable masses or hepatomegaly. Rectal:  Not performed.  Psychiatric: Demonstrates good judgement and reason without abnormal affect or behaviors.  MOST RECENT LABS AND IMAGING: CBC    Component Value  Date/Time   WBC 5.3 03/11/2019 1427   RBC 4.84 03/11/2019 1427   HGB 14.0 03/11/2019 1427   HCT 42.0 03/11/2019 1427   PLT 211.0 03/11/2019 1427   MCV 86.8 03/11/2019 1427   MCH 28.6 01/19/2018 1145   MCHC 33.4 03/11/2019 1427   RDW 13.8 03/11/2019 1427   LYMPHSABS 2.4 09/16/2018 1135   MONOABS 0.5 09/16/2018 1135   EOSABS 0.6 09/16/2018 1135   BASOSABS 0.0 09/16/2018 1135    CMP     Component Value Date/Time   NA 140 03/11/2019 1427   K 4.0 03/11/2019 1427   CL 104 03/11/2019 1427   CO2 27 03/11/2019 1427   GLUCOSE 112 (H) 03/11/2019 1427   BUN 10 03/11/2019 1427   CREATININE 0.92 03/11/2019 1427   CREATININE 0.88 09/22/2015 1454   CALCIUM 8.6 03/11/2019 1427   PROT 6.6 03/11/2019 1427   ALBUMIN 4.3 03/11/2019 1427   AST 26 03/11/2019 1427   ALT 38 03/11/2019 1427   ALKPHOS 66 03/11/2019 1427   BILITOT 1.0 03/11/2019 1427   GFRNONAA >60 01/19/2018 1145   GFRAA >60 01/19/2018 1145    Assessment: 1.  GERD: Uncontrolled with diet and lifestyle modifications 2.  Epigastric pain: Likely with above 3.  Chest pain: Currently being worked up by cardiology 4.  Screening for colorectal cancer: Last colonoscopy greater than 10 years ago, patient is overdue for screening  Plan: 1.  Patient is currently undergoing cardiac work-up for chest pain.  Scheduled for an exercise tolerance test 06/01/2019.  Discussed with him that we would like to make sure that this is all okay before proceeding with  the EGD and colonoscopy.  We will send a cardiac clearance to his cardiologist.  Also discussed with him that if he does not hear from our clinic after being seen by the cardiologist he can call and let us know when we can schedule his procedures.  He will need a double with Dr. Rush Landmark in the [facility]. 2.  Started patient back on Omeprazole 20 mg daily.  Prescribed #30 with 5 refills.  Did discuss with the patient that if this does not seem strong enough we can go back to 40 mg/day which was his  original dosing. 3.  Reviewed antireflux diet and lifestyle modifications.  Patient is drinking orange flavored sparkling water a lot which may be contributing to his symptoms. 4.  Patient to follow in clinic per recommendations after cardiac work-up.  Ellouise Newer, PA-C [facility] 05/26/2019, 11:05 AM  Cc: Marin Olp, MD

## 2019-05-27 ENCOUNTER — Telehealth (HOSPITAL_COMMUNITY): Payer: Self-pay

## 2019-05-27 NOTE — Telephone Encounter (Signed)
Encounter complete. 

## 2019-05-28 ENCOUNTER — Telehealth (HOSPITAL_COMMUNITY): Payer: Self-pay | Admitting: *Deleted

## 2019-05-28 ENCOUNTER — Other Ambulatory Visit (HOSPITAL_COMMUNITY)
Admission: RE | Admit: 2019-05-28 | Discharge: 2019-05-28 | Disposition: A | Discharge status: Home / Self Care | Payer: No Typology Code available for payment source | Source: Ambulatory Visit | Attending: Cardiovascular Disease | Admitting: Cardiovascular Disease

## 2019-05-28 DIAGNOSIS — Z20828 Contact with and (suspected) exposure to other viral communicable diseases: Secondary | ICD-10-CM | POA: Diagnosis not present

## 2019-05-28 DIAGNOSIS — Z01812 Encounter for preprocedural laboratory examination: Secondary | ICD-10-CM | POA: Insufficient documentation

## 2019-05-28 NOTE — Telephone Encounter (Signed)
Follow up   Patient is returning call about instructions for ETT.

## 2019-05-28 NOTE — Telephone Encounter (Signed)
Close encounter 

## 2019-05-29 LAB — NOVEL CORONAVIRUS, NAA (HOSP ORDER, SEND-OUT TO REF LAB; TAT 18-24 HRS): SARS-CoV-2, NAA: NOT DETECTED

## 2019-06-01 ENCOUNTER — Ambulatory Visit (HOSPITAL_COMMUNITY)
Admission: RE | Admit: 2019-06-01 | Discharge: 2019-06-01 | Disposition: A | Discharge status: Home / Self Care | Payer: No Typology Code available for payment source | Source: Ambulatory Visit | Attending: Cardiovascular Disease | Admitting: Cardiovascular Disease

## 2019-06-01 ENCOUNTER — Other Ambulatory Visit: Payer: Self-pay

## 2019-06-01 DIAGNOSIS — I10 Essential (primary) hypertension: Secondary | ICD-10-CM | POA: Diagnosis not present

## 2019-06-01 LAB — EXERCISE TOLERANCE TEST
Estimated workload: 7.2 METS
Exercise duration (min): 6 min
Exercise duration (sec): 0 s
MPHR: 168 {beats}/min
Peak HR: 150 {beats}/min
Percent HR: 89 %
Rest HR: 80 {beats}/min

## 2019-06-06 ENCOUNTER — Other Ambulatory Visit: Payer: Self-pay | Admitting: Family Medicine

## 2019-06-09 DIAGNOSIS — G4730 Sleep apnea, unspecified: Secondary | ICD-10-CM | POA: Insufficient documentation

## 2019-06-09 DIAGNOSIS — E118 Type 2 diabetes mellitus with unspecified complications: Secondary | ICD-10-CM | POA: Insufficient documentation

## 2019-06-09 NOTE — Progress Notes (Signed)
Cardiology Office Note   Date:  06/10/2019   ID:  Gabriel [name], DOB 1966-08-06, MRN [id number]  PCP:  Marin Olp, MD  Cardiologist:   Minus Breeding, MD   No chief complaint on file.     History of Present Illness: Gabriel [name] is a 53 y.o. male who presents for follow up of CAD which is being managed medically.   He has a history of an LAD stenosis of 50% demonstrated in December 2014. He had a normal  FFR at that time.  He was in the emergency room however in 2017 with chest discomfort. He was seen in consultation. It was felt to be somewhat atypical pain. He had some chest discomfort with burping. It did not respond to treatment with an antacid. His cardiac markers were negative in the ER. He did report having used nitroglycerin about one month prior to this. He had a stress perfusion study which demonstrated an EF of 56%. There was a medium-sized moderate defect in the basal inferoseptal mid anteroseptal and mid inferoseptal apical region.  He had a cardiac cath on 09/28/15 and was found to have 40% LAD stenosis.   Since I last saw him he had a treadmill test.  There was some upsloping ST segment changes that was read as indeterminant.  I had considered doing a coronary CT on him but he did not get this message.  He actually came back to this appointment.  He says that since I saw him he has been doing better.  He started taking some Prilosec.  He is noticed that some of his discomfort is with food.  He denies any chest pressure, neck or arm discomfort with activities.  The symptoms that he is having this chest pain radiating around his right side.  He has some bowing his low back.  He wonders if he could have kidney stones.  He has been having some foot pain.   Past Medical History:  Diagnosis Date  . Arthritis    "joints; from where I've had surgeries" (08/17/2013)  . Chest pain    a. 08/2013 [facility]ia CTA: Ca score 238 (95%), LM nl, LAD mod stenosis in D2 area, D1 mild  ost stenosis, D2 no signif dzs, LCX small, RCA nl.  . Coronary artery disease    LHC (08/18/13):  pLAD 50, CFX and RCA normal.  EF 55-65%.  LAD lesion FFR:  0.88 (normal).  Med rx recommended.    [name] Dysphagia    Upper endoscopy 11/2013 - without obvious stricture s/p Maloney dilation.   [name] GERD (gastroesophageal reflux disease)   . Hyperlipidemia    a. Dx 3y ago - not on statin.  [name] Hypertension    a. Dx 3y ago.  [name] Hypothyroidism    a. on replacement.  . IBS (irritable bowel syndrome)    colonoscopy 2000  . Obesity   . Sleep apnea    does not use CPAP  . Snoring   . Type II diabetes mellitus ([place])     Past Surgical History:  Procedure Laterality Date  . ANKLE SURGERY Right    "had a growth in it; cut it out" (08/17/2013)  . CARDIAC CATHETERIZATION N/A 09/28/2015   Procedure: Left Heart Cath and Coronary Angiography;  Surgeon: Peter M Martinique, MD;  Location: [facility];  Service: Cardiovascular;  Laterality: N/A;  . CHOLECYSTECTOMY    . KNEE ARTHROSCOPY Right X 2  . LEFT HEART CATHETERIZATION WITH CORONARY ANGIOGRAM  N/A 08/18/2013   Procedure: LEFT HEART CATHETERIZATION WITH CORONARY ANGIOGRAM;  Surgeon: Peter M Martinique, MD;  Location: [facility] CATH LAB;  Service: Cardiovascular;  Laterality: N/A;  . SHOULDER ARTHROSCOPY W/ ROTATOR CUFF REPAIR Left      Current Outpatient Medications  Medication Sig Dispense Refill  . acetaminophen (TYLENOL) 500 MG tablet Take 1,000 mg by mouth every 6 (six) hours as needed for mild pain or moderate pain. Reported on 01/09/2016    . aspirin EC 81 MG EC tablet Take 1 tablet (81 mg total) by mouth daily.    [name] glimepiride (AMARYL) 4 MG tablet TAKE 1 TABLET BY MOUTH ONCE DAILY BEFORE  BREAKFAST 90 tablet 0  . glucose blood (ONE TOUCH TEST STRIPS) test strip Up to 4 times daily.  Onetouch Verio.  E11.65 100 each 12  . Lancets (ONETOUCH ULTRASOFT) lancets Up to 4 times daily.  Onetouch Verio.  DxE11.65 100 each 12  . levothyroxine (SYNTHROID) 100 MCG  tablet Take 1 tablet by mouth once daily 90 tablet 1  . losartan (COZAAR) 100 MG tablet Take 1 tablet by mouth once daily 90 tablet 1  . metFORMIN (GLUCOPHAGE) 1000 MG tablet Take 1 tablet (1,000 mg total) by mouth 2 (two) times daily with a meal. 180 tablet 3  . Multiple Vitamins-Minerals (MULTIVITAMIN,TX-MINERALS) tablet Take 1 tablet by mouth daily.      . nitroGLYCERIN (NITROSTAT) 0.4 MG SL tablet Place 1 tablet (0.4 mg total) under the tongue every 5 (five) minutes as needed for chest pain (CP or SOB). 25 tablet 3  . omeprazole (PRILOSEC) 20 MG capsule Take 1 capsule (20 mg total) by mouth daily. 30 capsule 3  . simvastatin (ZOCOR) 40 MG tablet Take 1 tablet (40 mg total) by mouth at bedtime. 90 tablet 3   No current facility-administered medications for this visit.     Allergies:   Patient has no known allergies.    ROS:  Please see the history of present illness.   Otherwise, review of systems are positive for none.   All other systems are reviewed and negative.    PHYSICAL EXAM: VS:  BP 139/85   Pulse 87   Ht 5\' 9"  (1.753 m)   Wt (!) 325 lb 6.4 oz (147.6 kg)   BMI 48.05 kg/m  , BMI Body mass index is 48.05 kg/m. GENERAL:  Well appearing NECK:  No jugular venous distention, waveform within normal limits, carotid upstroke brisk and symmetric, no bruits, no thyromegaly LUNGS:  Clear to auscultation bilaterally CHEST:  Unremarkable HEART:  PMI not displaced or sustained,S1 and S2 within normal limits, no S3, no S4, no clicks, no rubs, no murmurs ABD:  Flat, positive bowel sounds normal in frequency in pitch, no bruits, no rebound, no guarding, no midline pulsatile mass, no hepatomegaly, no splenomegaly EXT:  2 plus pulses throughout, mild edema, no cyanosis no clubbing   EKG:  EKG is not ordered today.   Recent Labs: 03/11/2019: ALT 38; BUN 10; Creatinine, Ser 0.92; Hemoglobin 14.0; Platelets 211.0; Potassium 4.0; Sodium 140; TSH 2.99    Lipid Panel    Component Value  Date/Time   CHOL 129 07/06/2018 1112   TRIG 140.0 07/06/2018 1112   HDL 36.10 (L) 07/06/2018 1112   CHOLHDL 4 07/06/2018 1112   VLDL 28.0 07/06/2018 1112   LDLCALC 65 07/06/2018 1112   LDLDIRECT 68.0 03/11/2019 1427      Wt Readings from Last 3 Encounters:  06/10/19 (!) 325 lb 6.4 oz (147.6 kg)  05/26/19 (!) 323 lb (146.5 kg)  03/09/19 (!) 310 lb (140.6 kg)      Other studies Reviewed: Additional studies/ records that were reviewed today include: POET (Plain Old Exercise Treadmill). Review of the above records demonstrates:  Please see elsewhere in the note.     ASSESSMENT AND PLAN:  CAD: He had a POET (Plain Old Exercise Treadmill) which was equivocal.  I suggested a coronary CT. however, I went back and reviewed the results from context with his very atypical symptoms that have improved since starting Prilosec.  I think these were upsloping ST segment changes.  I do not think there are any high risk features or strong suggestions of progressive coronary artery disease.  Given the fact that he is doing better and can pinpoint an etiology of his complaints may be to diet this is probably GI and I would not suggest further work-up at this point.  He will let me know if he has any worsening symptoms going forward and we might need to pursue CT or catheterization.  GERD:    He continues to follow with GI.  He can continue current therapy.  Snoring:   He has sleep apnea but could not afford CPAP.   He is now looking to this again.  Hypertension: The blood pressure is  at target.  No change in therapy.   Dyslipidemia: 65 with an HDL of 36.  He will continue the meds as listed.   Diabetes Mellitus:  A1c was 6.7.  I will defer to his primary provider.  Obesity:   He is unfortunately gained about 10 pounds since he was last seen.  He understands the need to lose weight with diet and exercise.  We discussed weight loss strategies.     Current medicines are reviewed at length  with the patient today.  The patient does not have concerns regarding medicines.  The following changes have been made:  no change  Labs/ tests ordered today include: None  No orders of the defined types were placed in this encounter.    Disposition:   FU with APP in six months or sooner if needed.      Signed, Minus Breeding, MD  06/10/2019 11:19 AM    [place]

## 2019-06-10 ENCOUNTER — Encounter: Payer: Self-pay | Admitting: Cardiology

## 2019-06-10 ENCOUNTER — Other Ambulatory Visit: Payer: Self-pay

## 2019-06-10 ENCOUNTER — Ambulatory Visit (INDEPENDENT_AMBULATORY_CARE_PROVIDER_SITE_OTHER): Payer: No Typology Code available for payment source | Admitting: Cardiology

## 2019-06-10 VITALS — BP 139/85 | HR 87 | Ht 69.0 in | Wt 325.4 lb

## 2019-06-10 DIAGNOSIS — E118 Type 2 diabetes mellitus with unspecified complications: Secondary | ICD-10-CM | POA: Diagnosis not present

## 2019-06-10 DIAGNOSIS — I10 Essential (primary) hypertension: Secondary | ICD-10-CM | POA: Diagnosis not present

## 2019-06-10 DIAGNOSIS — G4730 Sleep apnea, unspecified: Secondary | ICD-10-CM | POA: Diagnosis not present

## 2019-06-10 DIAGNOSIS — I25118 Atherosclerotic heart disease of native coronary artery with other forms of angina pectoris: Secondary | ICD-10-CM | POA: Diagnosis not present

## 2019-06-10 DIAGNOSIS — E785 Hyperlipidemia, unspecified: Secondary | ICD-10-CM

## 2019-06-10 NOTE — Patient Instructions (Signed)
Medication Instructions:  Your physician recommends that you continue on your current medications as directed. Please refer to the Current Medication list given to you today.  If you need a refill on your cardiac medications before your next appointment, please call your pharmacy.   Lab work: NONE  Testing/Procedures: NONE  Follow-Up: At [facility], you and your health needs are our priority.  As part of our continuing mission to provide you with exceptional heart care, we have created designated Provider Care Teams.  These Care Teams include your primary Cardiologist (physician) and Advanced Practice Providers (APPs -  Physician Assistants and Nurse Practitioners) who all work together to provide you with the care you need, when you need it. You will need a follow up appointment in 12 months.  Please call our office 2 months in advance to schedule this appointment.  You may see Minus Breeding, MD or one of the following Advanced Practice Providers on your designated Care Team:   Rosaria Ferries, PA-C Jory Sims, DNP, ANP  Any Other Special Instructions Will Be Listed Below (If Applicable). Schedule an appointment with either Jory Sims or Rosaria Ferries in 6 months.

## 2019-06-25 ENCOUNTER — Ambulatory Visit (INDEPENDENT_AMBULATORY_CARE_PROVIDER_SITE_OTHER): Payer: No Typology Code available for payment source | Admitting: Family Medicine

## 2019-06-25 ENCOUNTER — Encounter: Payer: Self-pay | Admitting: Family Medicine

## 2019-06-25 ENCOUNTER — Ambulatory Visit (INDEPENDENT_AMBULATORY_CARE_PROVIDER_SITE_OTHER): Payer: No Typology Code available for payment source

## 2019-06-25 ENCOUNTER — Other Ambulatory Visit: Payer: Self-pay

## 2019-06-25 VITALS — BP 122/68 | HR 81 | Temp 98.2°F | Ht 69.0 in | Wt 323.0 lb

## 2019-06-25 DIAGNOSIS — R350 Frequency of micturition: Secondary | ICD-10-CM | POA: Diagnosis not present

## 2019-06-25 DIAGNOSIS — M549 Dorsalgia, unspecified: Secondary | ICD-10-CM | POA: Diagnosis not present

## 2019-06-25 LAB — POCT URINALYSIS DIPSTICK
Bilirubin, UA: NEGATIVE
Blood, UA: NEGATIVE
Glucose, UA: NEGATIVE
Ketones, UA: NEGATIVE
Leukocytes, UA: NEGATIVE
Nitrite, UA: NEGATIVE
Protein, UA: NEGATIVE
Spec Grav, UA: >=1.03 — AB (ref 1.010–1.025)
Urobilinogen, UA: 0.2 E.U./dL
pH, UA: 5.5 (ref 5.0–8.0)

## 2019-06-25 MED ORDER — CYCLOBENZAPRINE HCL 10 MG PO TABS: 10.0000 mg | ORAL_TABLET | Freq: Three times a day (TID) | ORAL | 0 refills | Status: DC | PRN

## 2019-06-25 NOTE — Progress Notes (Signed)
Patient: Gabriel [name] MRN: [id number] DOB: 1965-11-29 PCP: Marin Olp, MD     Subjective:  Chief Complaint  Patient presents with  . poss UTI  . Urinary Frequency    HPI: The patient is a 53 y.o. male who presents today for possible UTI. He is quite a poor historian.  He states symptoms have been going on for 1.5 years. Started in may of 2019 when he went to ER for flank pain and thought he had a kidney stone. CT and work up was negative. He has never brought this up with PCP that I can see, but he keeps telling me he has when I ask why he hasn't addressed this with him. He states the pain has come and gone since that time.  He states he has been having pain in his right back, more lower ribs than flank pain. He has no blood in his urine, dysuria, urgency. He is having increased frequency and is going about 4-5x/night. He does have history of diabetes, but his sugars are decently controlled on average. (<200). Last a1c was 6.8, 3 months ago. He has no pain with BM or pain in his buttock area. No fever/chills, no n/v/d.   Pain is about 6/10 at it's worse, most of the time it's a dull pain at its worse. He didn't fall on his rib or trauma. He has been given muscle relaxer in the past and this did help some. He also has taken ibuprofen and tylenol and this helps as well. No radiation of pain.     Review of Systems  Constitutional: Negative for chills and fever.  Gastrointestinal: Negative for abdominal pain, diarrhea, nausea and vomiting.  Endocrine: Positive for polyuria. Negative for polydipsia and polyphagia.  Genitourinary: Positive for frequency. Negative for difficulty urinating, dysuria, flank pain, hematuria, testicular pain and urgency.  Musculoskeletal: Positive for back pain (right lower thoracic pain ).    Allergies Patient has No Known Allergies.  Past Medical History Patient  has a past medical history of Arthritis, Chest pain, Coronary artery disease,  Dysphagia, GERD (gastroesophageal reflux disease), Hyperlipidemia, Hypertension, Hypothyroidism, IBS (irritable bowel syndrome), Obesity, Sleep apnea, Snoring, and Type II diabetes mellitus (Gabriel [name]).  Surgical History Patient  has a past surgical history that includes Shoulder arthroscopy w/ rotator cuff repair (Left); Knee arthroscopy (Right, X 2); Ankle surgery (Right); Cholecystectomy; left heart catheterization with coronary angiogram (N/A, 08/18/2013); and Cardiac catheterization (N/A, 09/28/2015).  Family History Pateint's family history includes Asthma in his maternal grandfather; Coronary artery disease in his father; Diabetes in his brother, father, and mother; Hypertension in his father and mother; Obesity in his brother and mother; Stroke in his father. He was adopted.  Social History Patient  reports that he quit smoking about 11 years ago. His smoking use included cigarettes. He has a 36.00 pack-year smoking history. He has never used smokeless tobacco. He reports that he does not drink alcohol or use drugs.    Objective: Vitals:   06/25/19 1458  BP: 122/68  Pulse: 81  Temp: 98.2 F (36.8 C)  TempSrc: Skin  SpO2: 96%  Weight: (!) 323 lb (146.5 kg)  Height: 5\' 9"  (1.753 m)    Body mass index is 47.7 kg/m.  Physical Exam Vitals signs reviewed.  Constitutional:      General: He is not in acute distress.    Appearance: He is obese. He is not ill-appearing.  HENT:     Head: Normocephalic and atraumatic.  Cardiovascular:  Rate and Rhythm: Normal rate and regular rhythm.     Heart sounds: Normal heart sounds.  Pulmonary:     Effort: Pulmonary effort is normal.     Breath sounds: Normal breath sounds.  Abdominal:     General: Bowel sounds are normal.     Palpations: Abdomen is soft.     Tenderness: There is no right CVA tenderness or left CVA tenderness.  Musculoskeletal:     Comments: TTP along right 11th posterior rib. No pain on spine.   Neurological:     Mental  Status: He is alert.    Rib xray: no acute findings. Official read pending Ua: normal     Assessment/plan: 1. Urine frequency ua not impressive and hx/exam not impressive for urine infection. Prostatitis low on differential as well. Checking labs/urine culture and sugar.  - Urine Culture - POCT urinalysis dipstick - CBC with Differential/Platelet - Comprehensive metabolic panel - Hemoglobin A1c - PSA  2. Mid back pain on right side Appears musculoskeletal on exam. ? Subluxated rib. Obesity not helping either and discussed this as well as lack of core strength, posture and large mid section pulling on back. Recommended back brace, heating pad, and will do small trial of muscle relaxer prn. Also showed him some stretches for intercostal muscles and lat muscle.  I also think sports med would be a good referral to see if subluxated rib, but he would like to see what results of labs/xrays show. If not better he needs to follow up with pcp, especially given its chronicity.  - DG Ribs Unilateral Right; Future   Return if symptoms worsen or fail to improve.   Orma Flaming, MD [place]   06/25/2019

## 2019-06-25 NOTE — Patient Instructions (Signed)
I don't think you have a urine infection as pain is more on your rib area.   -checking sugar/prostate and kidneys ot make sure no urine infection or worsening sugar since urinating more.   -xray looks good, but i'll let you know how they read it.   For you to do: Heating pad to that rib Stretching, good overhead stretches Muscle relaxer prn, caution drowsiness.  Ibuprofen prn for inflammation.   I wonder if you have a subluxated rib. I think it would be good to send you to sports medicine, but we can wait for all of this to come back as you requested.   Nice to meet you,  Dr. Rogers Blocker

## 2019-06-26 LAB — CBC WITH DIFFERENTIAL/PLATELET
Absolute Monocytes: 588 cells/uL (ref 200–950)
Basophils Absolute: 48 cells/uL (ref 0–200)
Basophils Relative: 0.8 %
Eosinophils Absolute: 288 cells/uL (ref 15–500)
Eosinophils Relative: 4.8 %
HCT: 41.8 % (ref 38.5–50.0)
Hemoglobin: 13.9 g/dL (ref 13.2–17.1)
Lymphs Abs: 2202 cells/uL (ref 850–3900)
MCH: 28.5 pg (ref 27.0–33.0)
MCHC: 33.3 g/dL (ref 32.0–36.0)
MCV: 85.8 fL (ref 80.0–100.0)
MPV: 12.3 fL (ref 7.5–12.5)
Monocytes Relative: 9.8 %
Neutro Abs: 2874 cells/uL (ref 1500–7800)
Neutrophils Relative %: 47.9 %
Platelets: 237 10*3/uL (ref 140–400)
RBC: 4.87 10*6/uL (ref 4.20–5.80)
RDW: 13.2 % (ref 11.0–15.0)
Total Lymphocyte: 36.7 %
WBC: 6 10*3/uL (ref 3.8–10.8)

## 2019-06-26 LAB — HEMOGLOBIN A1C
Hgb A1c MFr Bld: 7.3 % of total Hgb — ABNORMAL HIGH (ref <5.7)
Mean Plasma Glucose: 163 (calc)
eAG (mmol/L): 9 (calc)

## 2019-06-26 LAB — URINE CULTURE
MICRO NUMBER:: 998528
Result:: NO GROWTH
SPECIMEN QUALITY:: ADEQUATE

## 2019-06-26 LAB — COMPREHENSIVE METABOLIC PANEL
AG Ratio: 1.6 (calc) (ref 1.0–2.5)
ALT: 61 U/L — ABNORMAL HIGH (ref 9–46)
AST: 40 U/L — ABNORMAL HIGH (ref 10–35)
Albumin: 4.1 g/dL (ref 3.6–5.1)
Alkaline phosphatase (APISO): 81 U/L (ref 35–144)
BUN: 10 mg/dL (ref 7–25)
CO2: 24 mmol/L (ref 20–32)
Calcium: 9 mg/dL (ref 8.6–10.3)
Chloride: 103 mmol/L (ref 98–110)
Creat: 1.03 mg/dL (ref 0.70–1.33)
Globulin: 2.5 g/dL (calc) (ref 1.9–3.7)
Glucose, Bld: 180 mg/dL — ABNORMAL HIGH (ref 65–99)
Potassium: 4.2 mmol/L (ref 3.5–5.3)
Sodium: 138 mmol/L (ref 135–146)
Total Bilirubin: 0.9 mg/dL (ref 0.2–1.2)
Total Protein: 6.6 g/dL (ref 6.1–8.1)

## 2019-06-26 LAB — PSA: PSA: 0.4 ng/mL (ref <=4.0)

## 2019-07-20 ENCOUNTER — Ambulatory Visit: Payer: No Typology Code available for payment source | Admitting: Family Medicine

## 2019-08-09 ENCOUNTER — Other Ambulatory Visit: Payer: Self-pay

## 2019-08-10 ENCOUNTER — Encounter: Payer: Self-pay | Admitting: Family Medicine

## 2019-08-10 ENCOUNTER — Ambulatory Visit (INDEPENDENT_AMBULATORY_CARE_PROVIDER_SITE_OTHER): Payer: No Typology Code available for payment source | Admitting: Family Medicine

## 2019-08-10 ENCOUNTER — Other Ambulatory Visit: Payer: Self-pay

## 2019-08-10 VITALS — Temp 98.9°F | Ht 69.0 in | Wt 323.0 lb

## 2019-08-10 DIAGNOSIS — E119 Type 2 diabetes mellitus without complications: Secondary | ICD-10-CM | POA: Diagnosis not present

## 2019-08-10 DIAGNOSIS — G8929 Other chronic pain: Secondary | ICD-10-CM

## 2019-08-10 DIAGNOSIS — J029 Acute pharyngitis, unspecified: Secondary | ICD-10-CM | POA: Diagnosis not present

## 2019-08-10 DIAGNOSIS — M545 Low back pain, unspecified: Secondary | ICD-10-CM

## 2019-08-10 DIAGNOSIS — Z20822 Contact with and (suspected) exposure to covid-19: Secondary | ICD-10-CM

## 2019-08-10 MED ORDER — GLIMEPIRIDE 4 MG PO TABS: ORAL_TABLET | ORAL | 3 refills | Status: DC

## 2019-08-10 NOTE — Patient Instructions (Addendum)
Health Maintenance Due  Topic Date Due  . PNEUMOCOCCAL POLYSACCHARIDE VACCINE AGE 52-64 HIGH RISK -will discuss at next OV 04/21/1969  . OPHTHALMOLOGY EXAM -will schedule 10/26/2016  . COLONOSCOPY - will schedule 04/21/2017  . FOOT EXAM -will do at next visit 06/27/2018   Depression screen [facility] 2/9 03/09/2019 06/25/2018  Decreased Interest 0 0  Down, Depressed, Hopeless 0 0  PHQ - 2 Score 0 0

## 2019-08-10 NOTE — Progress Notes (Signed)
Phone [phone] Virtual visit via phonenote   Subjective:   Chief Complaint  Patient presents with  . Follow-up  . Diabetes  . Hypertension   This visit type was conducted due to national recommendations for restrictions regarding the COVID-19 Pandemic (e.g. social distancing).  This format is felt to be most appropriate for this patient at this time balancing risks to patient and risks to population by having him in for in person visit.  All issues noted in this document were discussed and addressed.  No physical exam was performed (except for noted visual exam or audio findings with Telehealth visits).  The patient has consented to conduct a Telehealth visit and understands insurance will be billed.   Our team/I connected with [name] at 11:20 AM EST by phone (patient did not have equipment for webex) and verified that I am speaking with the correct person using two identifiers.  Location patient: [facility] Location provider: [facility], office Persons participating in the virtual visit:  patient  Time on phone: 12 minutes Counseling provided about COVID 19 symptoms, diabetes control, importance of exercise  Our team/I discussed the limitations of evaluation and management by telemedicine and the availability of in person appointments. In light of current covid-19 pandemic, patient also understands that we are trying to protect them by minimizing in office contact if at all possible.  The patient expressed consent for telemedicine visit and agreed to proceed. Patient understands insurance will be billed.   ROS- no fever/chills/cough/congestion- does have scratchy/sore throat   Past Medical History-  Patient Active Problem List   Diagnosis Date Noted  . Coronary Artery Disease 08/26/2013    Priority: High  . Diabetes mellitus without complication ([place]) [id number]    Priority: High  . 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 Morbid obesity ([place])  08/10/2013    Priority: Medium  . Hypertension associated with diabetes ([place]) 02/06/2011    Priority: Medium  . Hypothyroidism 02/06/2011    Priority: Medium  . Hyperlipidemia associated with type 2 diabetes mellitus ([place]) 02/06/2011    Priority: Medium  . GERD (gastroesophageal reflux disease) 02/01/2015    Priority: Low  . Dysphagia 09/30/2013    Priority: Low  . Sleep apnea 06/09/2019  . Type 2 diabetes mellitus with complication, without long-term current use of insulin ([place]) 06/09/2019  . Educated about COVID-19 virus infection 01/18/2019  . Colon cancer screening 10/27/2018  . Midepigastric pain 09/27/2018  . Change in bowel habits 09/27/2018  . Family hx colonic polyps 09/27/2018  . BRBPR (bright red blood per rectum) 09/27/2018  . Left lateral epicondylitis 03/18/2016    Medications- reviewed and updated Current Outpatient Medications  Medication Sig Dispense Refill  . acetaminophen (TYLENOL) 500 MG tablet Take 1,000 mg by mouth every 6 (six) hours as needed for mild pain or moderate pain. Reported on 01/09/2016    . aspirin EC 81 MG EC tablet Take 1 tablet (81 mg total) by mouth daily.    . cyclobenzaprine (FLEXERIL) 10 MG tablet Take 1 tablet (10 mg total) by mouth 3 (three) times daily as needed for muscle spasms. 30 tablet 0  . glimepiride (AMARYL) 4 MG tablet TAKE 2 TABLET BY MOUTH ONCE DAILY BEFORE  BREAKFAST 180 tablet 3  . glucose blood (ONE TOUCH TEST STRIPS) test strip Up to 4 times daily.  Onetouch Verio.  E11.65 100 each 12  . Lancets (ONETOUCH ULTRASOFT) lancets Up to 4 times daily.  Onetouch Verio.  DxE11.65 100 each 12  . levothyroxine (SYNTHROID) 100 MCG tablet Take 1 tablet by mouth once daily 90 tablet 1  . losartan (COZAAR) 100 MG tablet Take 1 tablet by mouth once daily 90 tablet 1  . metFORMIN (GLUCOPHAGE) 1000 MG tablet Take 1 tablet (1,000 mg total) by mouth 2 (two) times daily with a meal. 180 tablet 3  . Multiple Vitamins-Minerals  (MULTIVITAMIN,TX-MINERALS) tablet Take 1 tablet by mouth daily.      . nitroGLYCERIN (NITROSTAT) 0.4 MG SL tablet Place 1 tablet (0.4 mg total) under the tongue every 5 (five) minutes as needed for chest pain (CP or SOB). 25 tablet 3  . Omega-3 Fatty Acids (FISH OIL) 1000 MG CAPS Take by mouth.    [name] omeprazole (PRILOSEC) 20 MG capsule Take 1 capsule (20 mg total) by mouth daily. 30 capsule 3  . simvastatin (ZOCOR) 40 MG tablet Take 1 tablet (40 mg total) by mouth at bedtime. 90 tablet 3   No current facility-administered medications for this visit.      Objective:  Temp 98.9 F (37.2 C)   Ht 5\' 9"  (1.753 m)   Wt (!) 323 lb (146.5 kg)   BMI 47.70 kg/m  self reported vitals Gen: NAD, resting comfortably based on voice    Assessment and Plan    # Scratchy throat S: pt states he has had a scratchy throat for the past few days.  Otherwise asymptomatic.  He did get with family on Thanksgiving but no one was symptomatic-apparently his sister was in a school where there was a COVID-38 outbreak but she had no direct exposure  A/P: Out of an abundance of precaution we opted to get COVID-19 testing for him-if he has new or worsening symptoms should let us know.  If his test result is negative he can be released from self quarantine.  # Diabetes S: compliant with metformin 1 tiwce a day and glimepiride 4 mg.   Sugars have increased recently from 120 to 150s fastings and he thinks he may need an increase in his meds. He states his diet and exercise are a big issue for him due to corona so he is not going to the gym. We discussed home exercise regimen Lab Results  Component Value Date   HGBA1C 7.3 (H) 06/25/2019   ua and culture normal- but with polyuria up to 20x a day. Does drink 100 oz a day.   A/P: Suspect worsening diabetes control with fasting CBGs trending up.  Increase glimepiride to 8 mg and continue Metformin 1000 mg twice a day.  I will have him cut water intake back from 100 ounces  a day to 80 ounces per day due to polyuria.  If sugars continue to trend up he will contact me.  If COVID-19 test is negative you can try to get him in sports medicine to see if we can get his leg pain better and get him exercising again.  #Low back pain/left leg pain. S: slipped in back yard and has had left calf and hamstring pain since that time. Still having back and rib painContinues to have pain in feet and calves- no swelling in calves at present but started out with some swelling  A/P: Patient had seen Dr. Rogers Blocker about 6 weeks ago but given the ongoing issues I told patient I would like for him to see sports medicine after we get a negative COVID-19 test back.  #Hypertension-reports BPS 120s/80s at home.  Reasonable control-continue current medicines  Recommended follow up: Encouraged him to call to schedule 4 month follow-up  Lab/Order associations:   ICD-10-CM   1. Sore throat  J02.9 Novel Coronavirus, NAA ([facility])  2. Chronic low back pain, unspecified back pain laterality, unspecified whether sciatica present  M54.5 Ambulatory referral to Sports Medicine   G89.29   3. Diabetes mellitus without complication ([facility])  [id number]     Meds ordered this encounter  Medications  . glimepiride (AMARYL) 4 MG tablet    Sig: TAKE 2 TABLET BY MOUTH ONCE DAILY BEFORE  BREAKFAST    Dispense:  180 tablet    Refill:  3    Return precautions advised.  Garret Reddish, MD

## 2019-08-12 LAB — NOVEL CORONAVIRUS, NAA: SARS-CoV-2, NAA: NOT DETECTED

## 2019-08-27 ENCOUNTER — Other Ambulatory Visit: Payer: Self-pay

## 2019-08-27 ENCOUNTER — Telehealth: Payer: Self-pay | Admitting: Gastroenterology

## 2019-08-27 MED ORDER — OMEPRAZOLE 20 MG PO CPDR: 20.0000 mg | DELAYED_RELEASE_CAPSULE | Freq: Every day | ORAL | 3 refills | Status: AC

## 2019-09-07 NOTE — Telephone Encounter (Signed)
Medication sent to pharmacy  

## 2019-09-29 ENCOUNTER — Telehealth: Payer: Self-pay

## 2019-09-29 NOTE — Telephone Encounter (Signed)
Agree ED disposition

## 2019-10-03 ENCOUNTER — Observation Stay (HOSPITAL_BASED_OUTPATIENT_CLINIC_OR_DEPARTMENT_OTHER): Payer: Self-pay

## 2019-10-03 ENCOUNTER — Observation Stay (HOSPITAL_COMMUNITY): Payer: Self-pay

## 2019-10-03 ENCOUNTER — Emergency Department (HOSPITAL_COMMUNITY): Payer: Self-pay

## 2019-10-03 ENCOUNTER — Inpatient Hospital Stay (HOSPITAL_COMMUNITY)
Admission: EM | Admit: 2019-10-03 | Discharge: 2019-10-05 | DRG: 247 | Disposition: A | Discharge status: Home / Self Care | Payer: Self-pay | Attending: Family Medicine | Admitting: Family Medicine

## 2019-10-03 ENCOUNTER — Other Ambulatory Visit: Payer: Self-pay

## 2019-10-03 ENCOUNTER — Encounter (HOSPITAL_COMMUNITY): Payer: Self-pay | Admitting: Emergency Medicine

## 2019-10-03 DIAGNOSIS — R0602 Shortness of breath: Secondary | ICD-10-CM

## 2019-10-03 DIAGNOSIS — I25110 Atherosclerotic heart disease of native coronary artery with unstable angina pectoris: Principal | ICD-10-CM | POA: Diagnosis present

## 2019-10-03 DIAGNOSIS — Z955 Presence of coronary angioplasty implant and graft: Secondary | ICD-10-CM

## 2019-10-03 DIAGNOSIS — I2510 Atherosclerotic heart disease of native coronary artery without angina pectoris: Secondary | ICD-10-CM

## 2019-10-03 DIAGNOSIS — R0600 Dyspnea, unspecified: Secondary | ICD-10-CM | POA: Diagnosis present

## 2019-10-03 DIAGNOSIS — I10 Essential (primary) hypertension: Secondary | ICD-10-CM | POA: Diagnosis present

## 2019-10-03 DIAGNOSIS — R079 Chest pain, unspecified: Secondary | ICD-10-CM | POA: Diagnosis present

## 2019-10-03 DIAGNOSIS — Z7982 Long term (current) use of aspirin: Secondary | ICD-10-CM

## 2019-10-03 DIAGNOSIS — I200 Unstable angina: Secondary | ICD-10-CM

## 2019-10-03 DIAGNOSIS — Z833 Family history of diabetes mellitus: Secondary | ICD-10-CM

## 2019-10-03 DIAGNOSIS — E785 Hyperlipidemia, unspecified: Secondary | ICD-10-CM | POA: Diagnosis present

## 2019-10-03 DIAGNOSIS — Z79890 Hormone replacement therapy (postmenopausal): Secondary | ICD-10-CM

## 2019-10-03 DIAGNOSIS — Z6841 Body Mass Index (BMI) 40.0 and over, adult: Secondary | ICD-10-CM

## 2019-10-03 DIAGNOSIS — K219 Gastro-esophageal reflux disease without esophagitis: Secondary | ICD-10-CM | POA: Diagnosis present

## 2019-10-03 DIAGNOSIS — E1169 Type 2 diabetes mellitus with other specified complication: Secondary | ICD-10-CM | POA: Diagnosis present

## 2019-10-03 DIAGNOSIS — M546 Pain in thoracic spine: Secondary | ICD-10-CM

## 2019-10-03 DIAGNOSIS — G4730 Sleep apnea, unspecified: Secondary | ICD-10-CM | POA: Diagnosis present

## 2019-10-03 DIAGNOSIS — Z8249 Family history of ischemic heart disease and other diseases of the circulatory system: Secondary | ICD-10-CM

## 2019-10-03 DIAGNOSIS — E1165 Type 2 diabetes mellitus with hyperglycemia: Secondary | ICD-10-CM | POA: Diagnosis present

## 2019-10-03 DIAGNOSIS — Z560 Unemployment, unspecified: Secondary | ICD-10-CM

## 2019-10-03 DIAGNOSIS — Z79899 Other long term (current) drug therapy: Secondary | ICD-10-CM

## 2019-10-03 DIAGNOSIS — M549 Dorsalgia, unspecified: Secondary | ICD-10-CM | POA: Diagnosis present

## 2019-10-03 DIAGNOSIS — Z20822 Contact with and (suspected) exposure to covid-19: Secondary | ICD-10-CM | POA: Diagnosis present

## 2019-10-03 DIAGNOSIS — Z87891 Personal history of nicotine dependence: Secondary | ICD-10-CM

## 2019-10-03 DIAGNOSIS — M1990 Unspecified osteoarthritis, unspecified site: Secondary | ICD-10-CM | POA: Diagnosis present

## 2019-10-03 DIAGNOSIS — E039 Hypothyroidism, unspecified: Secondary | ICD-10-CM | POA: Diagnosis present

## 2019-10-03 DIAGNOSIS — I2583 Coronary atherosclerosis due to lipid rich plaque: Secondary | ICD-10-CM | POA: Diagnosis present

## 2019-10-03 DIAGNOSIS — Z7984 Long term (current) use of oral hypoglycemic drugs: Secondary | ICD-10-CM

## 2019-10-03 LAB — CBC
HCT: 42.8 % (ref 39.0–52.0)
Hemoglobin: 14.1 g/dL (ref 13.0–17.0)
MCH: 29 pg (ref 26.0–34.0)
MCHC: 32.9 g/dL (ref 30.0–36.0)
MCV: 87.9 fL (ref 80.0–100.0)
Platelets: 246 10*3/uL (ref 150–400)
RBC: 4.87 MIL/uL (ref 4.22–5.81)
RDW: 12.8 % (ref 11.5–15.5)
WBC: 6.9 10*3/uL (ref 4.0–10.5)
nRBC: 0 % (ref 0.0–0.2)

## 2019-10-03 LAB — HEMOGLOBIN A1C
Hgb A1c MFr Bld: 8.1 % — ABNORMAL HIGH (ref 4.8–5.6)
Mean Plasma Glucose: 185.77 mg/dL

## 2019-10-03 LAB — RESPIRATORY PANEL BY RT PCR (FLU A&B, COVID)
Influenza A by PCR: NEGATIVE
Influenza B by PCR: NEGATIVE
SARS Coronavirus 2 by RT PCR: NEGATIVE

## 2019-10-03 LAB — LIPID PANEL
Cholesterol: 139 mg/dL (ref 0–200)
HDL: 32 mg/dL — ABNORMAL LOW (ref >40)
LDL Cholesterol: 75 mg/dL (ref 0–99)
Total CHOL/HDL Ratio: 4.3 RATIO
Triglycerides: 161 mg/dL — ABNORMAL HIGH (ref <150)
VLDL: 32 mg/dL (ref 0–40)

## 2019-10-03 LAB — GLUCOSE, CAPILLARY
Glucose-Capillary: 199 mg/dL — ABNORMAL HIGH (ref 70–99)
Glucose-Capillary: 213 mg/dL — ABNORMAL HIGH (ref 70–99)
Glucose-Capillary: 176 mg/dL — ABNORMAL HIGH (ref 70–99)

## 2019-10-03 LAB — BASIC METABOLIC PANEL
Anion gap: 11 (ref 5–15)
BUN: 14 mg/dL (ref 6–20)
CO2: 25 mmol/L (ref 22–32)
Calcium: 9.4 mg/dL (ref 8.9–10.3)
Chloride: 99 mmol/L (ref 98–111)
Creatinine, Ser: 1.07 mg/dL (ref 0.61–1.24)
GFR calc Af Amer: >60 mL/min (ref >60)
GFR calc non Af Amer: >60 mL/min (ref >60)
Glucose, Bld: 250 mg/dL — ABNORMAL HIGH (ref 70–99)
Potassium: 3.7 mmol/L (ref 3.5–5.1)
Sodium: 135 mmol/L (ref 135–145)

## 2019-10-03 LAB — CBG MONITORING, ED: Glucose-Capillary: 142 mg/dL — ABNORMAL HIGH (ref 70–99)

## 2019-10-03 LAB — TROPONIN I (HIGH SENSITIVITY)
Troponin I (High Sensitivity): 2 ng/L (ref <18)
Troponin I (High Sensitivity): 3 ng/L (ref <18)

## 2019-10-03 LAB — BRAIN NATRIURETIC PEPTIDE: B Natriuretic Peptide: 21.6 pg/mL (ref 0.0–100.0)

## 2019-10-03 LAB — ECHOCARDIOGRAM COMPLETE

## 2019-10-03 LAB — TSH: TSH: 6.628 u[IU]/mL — ABNORMAL HIGH (ref 0.350–4.500)

## 2019-10-03 MED ORDER — LOSARTAN POTASSIUM 50 MG PO TABS
100.0000 mg | ORAL_TABLET | Freq: Every day | ORAL | Status: DC
  Administered 2019-10-03 – 2019-10-05 (×2): 100 mg via ORAL
  Filled 2019-10-03 (×2): qty 2

## 2019-10-03 MED ORDER — INSULIN ASPART 100 UNIT/ML ~~LOC~~ SOLN
0.0000 [IU] | Freq: Three times a day (TID) | SUBCUTANEOUS | Status: DC
  Administered 2019-10-03: 12:00:00 3 [IU] via SUBCUTANEOUS
  Administered 2019-10-03: 09:00:00 2 [IU] via SUBCUTANEOUS
  Administered 2019-10-03: 16:00:00 5 [IU] via SUBCUTANEOUS
  Administered 2019-10-04 (×3): 3 [IU] via SUBCUTANEOUS
  Administered 2019-10-05: 08:00:00 2 [IU] via SUBCUTANEOUS

## 2019-10-03 MED ORDER — PERFLUTREN LIPID MICROSPHERE
1.0000 mL | INTRAVENOUS | Status: AC | PRN
  Administered 2019-10-03: 8 mL via INTRAVENOUS
  Filled 2019-10-03: qty 10

## 2019-10-03 MED ORDER — SIMVASTATIN 20 MG PO TABS
40.0000 mg | ORAL_TABLET | Freq: Every day | ORAL | Status: DC
  Administered 2019-10-03: 40 mg via ORAL
  Filled 2019-10-03: qty 2

## 2019-10-03 MED ORDER — NITROGLYCERIN 0.4 MG SL SUBL
0.8000 mg | SUBLINGUAL_TABLET | Freq: Once | SUBLINGUAL | Status: AC
  Administered 2019-10-03: 09:00:00 0.8 mg via SUBLINGUAL
  Filled 2019-10-03: qty 2

## 2019-10-03 MED ORDER — ALUM & MAG HYDROXIDE-SIMETH 200-200-20 MG/5ML PO SUSP: 30.0000 mL | Freq: Four times a day (QID) | ORAL | Status: DC | PRN

## 2019-10-03 MED ORDER — SODIUM CHLORIDE 0.9% FLUSH
3.0000 mL | Freq: Once | INTRAVENOUS | Status: AC
  Administered 2019-10-03: 3 mL via INTRAVENOUS

## 2019-10-03 MED ORDER — NITROGLYCERIN 0.4 MG SL SUBL: 0.4000 mg | SUBLINGUAL_TABLET | SUBLINGUAL | Status: DC | PRN

## 2019-10-03 MED ORDER — ENOXAPARIN SODIUM 40 MG/0.4ML ~~LOC~~ SOLN
40.0000 mg | SUBCUTANEOUS | Status: DC
  Administered 2019-10-03 – 2019-10-05 (×2): 40 mg via SUBCUTANEOUS
  Filled 2019-10-03 (×2): qty 0.4

## 2019-10-03 MED ORDER — INSULIN ASPART 100 UNIT/ML ~~LOC~~ SOLN: 0.0000 [IU] | Freq: Every day | SUBCUTANEOUS | Status: DC

## 2019-10-03 MED ORDER — ONDANSETRON HCL 4 MG/2ML IJ SOLN: 4.0000 mg | Freq: Four times a day (QID) | INTRAMUSCULAR | Status: DC | PRN

## 2019-10-03 MED ORDER — LEVOTHYROXINE SODIUM 100 MCG PO TABS
100.0000 ug | ORAL_TABLET | Freq: Every day | ORAL | Status: DC
  Administered 2019-10-03 – 2019-10-04 (×2): 100 ug via ORAL
  Filled 2019-10-03 (×2): qty 1

## 2019-10-03 MED ORDER — PANTOPRAZOLE SODIUM 40 MG PO TBEC
40.0000 mg | DELAYED_RELEASE_TABLET | Freq: Every day | ORAL | Status: DC
  Administered 2019-10-03 – 2019-10-05 (×3): 40 mg via ORAL
  Filled 2019-10-03 (×3): qty 1

## 2019-10-03 MED ORDER — ACETAMINOPHEN 325 MG PO TABS: 650.0000 mg | ORAL_TABLET | ORAL | Status: DC | PRN

## 2019-10-03 MED ORDER — SODIUM CHLORIDE 0.9 % IV SOLN: INTRAVENOUS | Status: DC

## 2019-10-03 NOTE — ED Notes (Signed)
Cardiology at bedside.

## 2019-10-03 NOTE — ED Provider Notes (Signed)
[facility] EMERGENCY DEPARTMENT Provider Note   CSN: [id number] Arrival date & time: 10/03/19  [id number]     History Chief Complaint  Patient presents with  . Chest Pain    Gabriel [name] is a 54 y.o. male.  Patient presents to the emergency department for evaluation of chest pain.  Patient reports that he has been experiencing left-sided chest pain for 2 days.  Pain has been waxing and waning.  He has been using nitroglycerin at home.  He reports that the pain improves when he takes a nitroglycerin but then comes back.  He is feeling increased shortness of breath.  There is no pleuritic pain.  He has not noticed any worsening of the pain with exertion.     HPI: A 54 year old patient with a history of treated diabetes, hypertension, hypercholesterolemia and obesity presents for evaluation of chest pain. Initial onset of pain was more than 6 hours ago. The patient's chest pain is not worse with exertion and is relieved by nitroglycerin. The patient's chest pain is middle- or left-sided, is not well-localized, is not described as heaviness/pressure/tightness, is not sharp and does not radiate to the arms/jaw/neck. The patient does not complain of nausea and denies diaphoresis. The patient has a family history of coronary artery disease in a first-degree relative with onset less than age 53. The patient has no history of stroke, has no history of peripheral artery disease and has not smoked in the past 90 days.   Past Medical History:  Diagnosis Date  . Arthritis    "joints; from where I've had surgeries" (08/17/2013)  . Chest pain    a. 08/2013 Cardia CTA: Ca score 238 (95%), LM nl, LAD mod stenosis in D2 area, D1 mild ost stenosis, D2 no signif dzs, LCX small, RCA nl.  . Coronary artery disease    LHC (08/18/13):  pLAD 50, CFX and RCA normal.  EF 55-65%.  LAD lesion FFR:  0.88 (normal).  Med rx recommended.    [name] Dysphagia    Upper endoscopy 11/2013 - without obvious  stricture s/p Maloney dilation.   [name] GERD (gastroesophageal reflux disease)   . Hyperlipidemia    a. Dx 3y ago - not on statin.  [name] Hypertension    a. Dx 3y ago.  [name] Hypothyroidism    a. on replacement.  . IBS (irritable bowel syndrome)    colonoscopy 2000  . Obesity   . Sleep apnea    does not use CPAP  . Snoring   . Type II diabetes mellitus [facility])     Patient Active Problem List   Diagnosis Date Noted  . Sleep apnea 06/09/2019  . Type 2 diabetes mellitus with complication, without long-term current use of insulin ([place]) 06/09/2019  . Educated about COVID-19 virus infection 01/18/2019  . Colon cancer screening 10/27/2018  . Midepigastric pain 09/27/2018  . Change in bowel habits 09/27/2018  . Family hx colonic polyps 09/27/2018  . BRBPR (bright red blood per rectum) 09/27/2018  . Left lateral epicondylitis 03/18/2016  . GERD (gastroesophageal reflux disease) 02/01/2015  . 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 Dysphagia 09/30/2013  . Coronary Artery Disease 08/26/2013  . Diabetes mellitus without complication ([place]) [id number]  . Morbid obesity ([place]) 08/10/2013  . Hypertension associated with diabetes ([place]) 02/06/2011  . Hypothyroidism 02/06/2011  . Hyperlipidemia associated with type 2 diabetes mellitus ([place]) 02/06/2011    Past Surgical History:  Procedure Laterality Date  . ANKLE SURGERY Right    "  had a growth in it; cut it out" (08/17/2013)  . CARDIAC CATHETERIZATION N/A 09/28/2015   Procedure: Left Heart Cath and Coronary Angiography;  Surgeon: Peter M Martinique, MD;  Location: [place] [facility];  Service: Cardiovascular;  Laterality: N/A;  . CHOLECYSTECTOMY    . KNEE ARTHROSCOPY Right X 2  . LEFT HEART CATHETERIZATION WITH CORONARY ANGIOGRAM N/A 08/18/2013   Procedure: LEFT HEART CATHETERIZATION WITH CORONARY ANGIOGRAM;  Surgeon: Peter M Martinique, MD;  Location: [facility] CATH LAB;  Service: Cardiovascular;  Laterality: N/A;  . SHOULDER ARTHROSCOPY W/ ROTATOR CUFF REPAIR Left         Family History  Adopted: Yes  Problem Relation Age of Onset  . Hypertension Mother   . Obesity Mother   . Diabetes Mother   . Coronary artery disease Father        s/p MI in his early 30's->CABG x 5 @ age 64, currently 47.  . Diabetes Father   . Stroke Father   . Hypertension Father   . Diabetes Brother   . Obesity Brother   . Asthma Maternal Grandfather   . Colon cancer Neg Hx   . Esophageal cancer Neg Hx   . Inflammatory bowel disease Neg Hx   . Liver disease Neg Hx   . Pancreatic cancer Neg Hx   . Rectal cancer Neg Hx   . Stomach cancer Neg Hx     Social History   Tobacco Use  . Smoking status: Former Smoker    Packs/day: 1.50    Years: 24.00    Pack years: 36.00    Types: Cigarettes    Quit date: 09/10/2007    Years since quitting: 12.0  . Smokeless tobacco: Never Used  Substance Use Topics  . Alcohol use: No  . Drug use: No    Home Medications Prior to Admission medications   Medication Sig Start Date End Date Taking? Authorizing Provider  acetaminophen (TYLENOL) 500 MG tablet Take 1,000 mg by mouth every 6 (six) hours as needed for mild pain or moderate pain. Reported on 01/09/2016   Yes [provider]  aspirin EC 81 MG EC tablet Take 1 tablet (81 mg total) by mouth daily. 08/10/13  Yes Theora Gianotti, NP  glimepiride (AMARYL) 4 MG tablet TAKE 2 TABLET BY MOUTH ONCE DAILY BEFORE  BREAKFAST Patient taking differently: Take 8 mg by mouth daily with breakfast.  08/10/19  Yes Marin Olp, MD  levothyroxine (SYNTHROID) 100 MCG tablet Take 1 tablet by mouth once daily Patient taking differently: Take 100 mcg by mouth daily before breakfast.  04/28/19  Yes Marin Olp, MD  losartan (COZAAR) 100 MG tablet Take 1 tablet by mouth once daily Patient taking differently: Take 100 mg by mouth daily.  04/28/19  Yes Marin Olp, MD  metFORMIN (GLUCOPHAGE) 1000 MG tablet Take 1 tablet (1,000 mg total) by mouth 2 (two) times daily with  a meal. 03/09/19  Yes Marin Olp, MD  Multiple Vitamins-Minerals (MULTIVITAMIN,TX-MINERALS) tablet Take 1 tablet by mouth daily.     Yes [provider]  nitroGLYCERIN (NITROSTAT) 0.4 MG SL tablet Place 1 tablet (0.4 mg total) under the tongue every 5 (five) minutes as needed for chest pain (CP or SOB). 03/09/19  Yes Marin Olp, MD  Omega-3 Fatty Acids (FISH OIL) 1000 MG CAPS Take 1,000 mg by mouth daily.    Yes [provider]  omeprazole (PRILOSEC) 20 MG capsule Take 1 capsule (20 mg total) by mouth  daily. 08/27/19  Yes Levin Erp, PA  simvastatin (ZOCOR) 40 MG tablet Take 1 tablet (40 mg total) by mouth at bedtime. 03/09/19  Yes Marin Olp, MD  cyclobenzaprine (FLEXERIL) 10 MG tablet Take 1 tablet (10 mg total) by mouth 3 (three) times daily as needed for muscle spasms. Patient not taking: Reported on 10/03/2019 06/25/19   Orma Flaming, MD  glucose blood (ONE TOUCH TEST STRIPS) test strip Up to 4 times daily.  Onetouch Verio.  E11.65 01/09/16   Marin Olp, MD  Lancets [facility] ULTRASOFT) lancets Up to 4 times daily.  Onetouch Verio.  [id number] 01/09/16   Marin Olp, MD    Allergies    Patient has no known allergies.  Review of Systems   Review of Systems  Respiratory: Positive for shortness of breath.   Cardiovascular: Positive for chest pain.  All other systems reviewed and are negative.   Physical Exam Updated Vital Signs BP 129/70   Pulse 93   Temp 98 F (36.7 C) (Oral)   Resp 16   SpO2 96%   Physical Exam Vitals and nursing note reviewed.  Constitutional:      General: He is not in acute distress.    Appearance: Normal appearance. He is well-developed.  HENT:     Head: Normocephalic and atraumatic.     Right Ear: Hearing normal.     Left Ear: Hearing normal.     Nose: Nose normal.  Eyes:     Conjunctiva/sclera: Conjunctivae normal.     Pupils: Pupils are equal, round, and reactive to light.  Cardiovascular:       Rate and Rhythm: Regular rhythm.     Heart sounds: S1 normal and S2 normal. No murmur. No friction rub. No gallop.   Pulmonary:     Effort: Pulmonary effort is normal. No respiratory distress.     Breath sounds: Normal breath sounds.  Chest:     Chest wall: No tenderness.  Abdominal:     General: Bowel sounds are normal.     Palpations: Abdomen is soft.     Tenderness: There is no abdominal tenderness. There is no guarding or rebound. Negative signs include Murphy's sign and McBurney's sign.     Hernia: No hernia is present.  Musculoskeletal:        General: Normal range of motion.     Cervical back: Normal range of motion and neck supple.  Skin:    General: Skin is warm and dry.     Findings: No rash.  Neurological:     Mental Status: He is alert and oriented to person, place, and time.     GCS: GCS eye subscore is 4. GCS verbal subscore is 5. GCS motor subscore is 6.     Cranial Nerves: No cranial nerve deficit.     Sensory: No sensory deficit.     Coordination: Coordination normal.  Psychiatric:        Speech: Speech normal.        Behavior: Behavior normal.        Thought Content: Thought content normal.     ED Results / Procedures / Treatments   Labs (all labs ordered are listed, but only abnormal results are displayed) Labs Reviewed  BASIC METABOLIC PANEL - Abnormal; Notable for the following components:      Result Value   Glucose, Bld 250 (*)    All other components within normal limits  CBC  BRAIN NATRIURETIC PEPTIDE  TROPONIN I (  HIGH SENSITIVITY)  TROPONIN I (HIGH SENSITIVITY)    EKG EKG Interpretation  Date/Time:  Sunday October 03 2019 04:32:41 EST Ventricular Rate:  85 PR Interval:    QRS Duration: 95 QT Interval:  397 QTC Calculation: 473 R Axis:   -29 Text Interpretation: Sinus rhythm Borderline left axis deviation Abnormal R-wave progression, early transition No significant change since last tracing Confirmed by Orpah Greek  [phone]) on 10/03/2019 4:34:01 AM   Radiology DG Chest 2 View  Result Date: 10/03/2019 CLINICAL DATA:  Left chest pain, shortness of breath EXAM: CHEST - 2 VIEW COMPARISON:  06/25/2019 FINDINGS: Lungs are clear.  No pleural effusion or pneumothorax. The heart is normal in size. Visualized osseous structures are within normal limits. IMPRESSION: Normal chest radiographs. Electronically Signed   By: Julian Hy M.D.   On: 10/03/2019 04:54    Procedures Procedures (including critical care time)  Medications Ordered in ED Medications  sodium chloride flush (NS) 0.9 % injection 3 mL (3 mLs Intravenous Given 10/03/19 0427)    ED Course  I have reviewed the triage vital signs and the nursing notes.  Pertinent labs & imaging results that were available during my care of the patient were reviewed by me and considered in my medical decision making (see chart for details).    MDM Rules/Calculators/A&P HEAR Score: 5                    Patient presents to the emergency department for evaluation of chest pain.  Patient has had left-sided chest pain for 2 days.  He has been taking nitroglycerin with only partial relief.  Patient has a history of nonobstructive coronary artery disease.  Patient had a heart cath in 2017 that showed:  Mid LAD lesion, 40% stenosed.  The left ventricular systolic function is normal.   1. Nonobstructive CAD  2. Normal LV function 3. Falsely positive nuclear stress test  He tells me that the cardiologist gave him the nitroglycerin to use for pain.  Reviewing his records reveals he did have a repeat stress test last year that was also equivocal.  He was supposed to have a cardiac CT for follow-up but has not had that testing.  Based on his cardiologist's previous treatment plan and suspicion for possible disease despite heart cath results in 2017, I do not feel the patient is a candidate for discharge.  Pain today, however, is felt to be likely noncardiac but he  does have a heart score of 5. Final Clinical Impression(s) / ED Diagnoses Final diagnoses:  Chest pain, unspecified type    Rx / DC Orders ED Discharge Orders    None       Orpah Greek, MD 10/03/19 [phone]

## 2019-10-03 NOTE — ED Triage Notes (Signed)
Pt reports central to L sided chest pain for the past few days with some associated sob. resp e/u, nad.

## 2019-10-03 NOTE — H&P (Addendum)
History and Physical    Gabriel [name] [id number] DOB: 1965-10-25 DOA: 10/03/2019  Referring MD/NP/PA: Joseph Berkshire, MD PCP: Marin Olp, MD  Patient coming from: Home  Chief Complaint: Chest pain  I have personally briefly reviewed patient's old medical records in [place]   HPI: Gabriel [name] is a 54 y.o. male with medical history significant of hypertension, hyperlipidemia, nonobstructive coronary artery disease by cardiac cath in 2017, diabetes mellitus type 2, hypothyroidism, morbid obesity, remote tobacco use, and GERD.  He presents with complaints of intermittent chest pain over the last 3 days.  Chest pain is centrally located and we will radiate over to the left side.  He characterizes the pain as dull, but has intermittently been sharp in nature.  Pain usually eases off when he takes nitroglycerin, but will return.  Last cardiac cath was in 2017 and showed nonobstructive disease.  He had exercise stress test in September 2020 that was stopped early due to shortness of breath and fatigue.  Over the last several weeks to months he has had progressively worsening shortness of breath.  Previously only occurred with walking, but now even occurs with standing or talking.  He has a family history of early heart disease and previously smoked 1/2 pack of cigarettes per day on average for 25 years, but quit about 12 years ago.  He also notes complaints of midline thoracic back pain, left leg tightness, and sore throat over the last 4 to 6 weeks.  Denies any recent falls or traumas as the cause of his back pain, but did report falling outside in the grass in the summer 2020.  He does note that he has had more urgency with his bladder/bowels.  He had a Covid test weeks ago that was negative as it relates to his sore throat.  Blood sugars have been up and down with sugars being greater than 250 at times. Over the holidays he admits that he may not have eaten like he should  at all times.  He does not drink sodas, but reports that his weakness is breads and pastas.  ED Course: Upon admission into the emergency department patient was seen to have vital signs relatively within normal limits.  Labs significant for glucose 250 and initial troponin negative.  Chest x-ray was within normal limits.  EKG did not show any significant ischemic changes from the previous tracing.  TRH called to admit.  Review of Systems  Constitutional: Positive for malaise/fatigue. Negative for fever.  HENT: Positive for sore throat.   Eyes: Negative for photophobia and pain.  Respiratory: Positive for shortness of breath. Negative for cough and stridor.   Cardiovascular: Positive for chest pain and leg swelling.  Gastrointestinal: Negative for abdominal pain, nausea and vomiting.  Genitourinary: Negative for dysuria and hematuria.  Musculoskeletal: Positive for back pain. Negative for falls.  Skin: Negative for rash.  Neurological: Positive for sensory change (Left foot). Negative for focal weakness and loss of consciousness.  Endo/Heme/Allergies: Negative for environmental allergies. Does not bruise/bleed easily.  Psychiatric/Behavioral: Negative for memory loss and substance abuse.    Past Medical History:  Diagnosis Date  . Arthritis    "joints; from where I've had surgeries" (08/17/2013)  . Chest pain    a. 08/2013 Cardia CTA: Ca score 238 (95%), LM nl, LAD mod stenosis in D2 area, D1 mild ost stenosis, D2 no signif dzs, LCX small, RCA nl.  . Coronary artery disease    LHC (08/18/13):  pLAD  50, CFX and RCA normal.  EF 55-65%.  LAD lesion FFR:  0.88 (normal).  Med rx recommended.    [name] Dysphagia    Upper endoscopy 11/2013 - without obvious stricture s/p Maloney dilation.   [name] GERD (gastroesophageal reflux disease)   . Hyperlipidemia    a. Dx 3y ago - not on statin.  [name] Hypertension    a. Dx 3y ago.  [name] Hypothyroidism    a. on replacement.  . IBS (irritable bowel syndrome)     colonoscopy 2000  . Obesity   . Sleep apnea    does not use CPAP  . Snoring   . Type II diabetes mellitus ([place])     Past Surgical History:  Procedure Laterality Date  . ANKLE SURGERY Right    "had a growth in it; cut it out" (08/17/2013)  . CARDIAC CATHETERIZATION N/A 09/28/2015   Procedure: Left Heart Cath and Coronary Angiography;  Surgeon: Peter M Martinique, MD;  Location: [facility];  Service: Cardiovascular;  Laterality: N/A;  . CHOLECYSTECTOMY    . KNEE ARTHROSCOPY Right X 2  . LEFT HEART CATHETERIZATION WITH CORONARY ANGIOGRAM N/A 08/18/2013   Procedure: LEFT HEART CATHETERIZATION WITH CORONARY ANGIOGRAM;  Surgeon: Peter M Martinique, MD;  Location: [facility] CATH [facility];  Service: Cardiovascular;  Laterality: N/A;  . SHOULDER ARTHROSCOPY W/ ROTATOR CUFF REPAIR Left      reports that he quit smoking about 12 years ago. His smoking use included cigarettes. He has a 36.00 pack-year smoking history. He has never used smokeless tobacco. He reports that he does not drink alcohol or use drugs.  No Known Allergies  Family History  Adopted: Yes  Problem Relation Age of Onset  . Hypertension Mother   . Obesity Mother   . Diabetes Mother   . Coronary artery disease Father        s/p MI in his early 30's->CABG x 5 @ age 43, currently 71.  . Diabetes Father   . Stroke Father   . Hypertension Father   . Diabetes Brother   . Obesity Brother   . Asthma Maternal Grandfather   . Colon cancer Neg Hx   . Esophageal cancer Neg Hx   . Inflammatory bowel disease Neg Hx   . Liver disease Neg Hx   . Pancreatic cancer Neg Hx   . Rectal cancer Neg Hx   . Stomach cancer Neg Hx     Prior to Admission medications   Medication Sig Start Date End Date Taking? Authorizing Provider  acetaminophen (TYLENOL) 500 MG tablet Take 1,000 mg by mouth every 6 (six) hours as needed for mild pain or moderate pain. Reported on 01/09/2016   Yes [provider]  aspirin EC 81 MG EC tablet Take 1 tablet (81 mg  total) by mouth daily. 08/10/13  Yes Theora Gianotti, NP  glimepiride (AMARYL) 4 MG tablet TAKE 2 TABLET BY MOUTH ONCE DAILY BEFORE  BREAKFAST Patient taking differently: Take 8 mg by mouth daily with breakfast.  08/10/19  Yes Marin Olp, MD  levothyroxine (SYNTHROID) 100 MCG tablet Take 1 tablet by mouth once daily Patient taking differently: Take 100 mcg by mouth daily before breakfast.  04/28/19  Yes Marin Olp, MD  losartan (COZAAR) 100 MG tablet Take 1 tablet by mouth once daily Patient taking differently: Take 100 mg by mouth daily.  04/28/19  Yes Marin Olp, MD  metFORMIN (GLUCOPHAGE) 1000 MG tablet Take 1 tablet (1,000 mg total) by mouth  2 (two) times daily with a meal. 03/09/19  Yes Marin Olp, MD  Multiple Vitamins-Minerals (MULTIVITAMIN,TX-MINERALS) tablet Take 1 tablet by mouth daily.     Yes [provider]  nitroGLYCERIN (NITROSTAT) 0.4 MG SL tablet Place 1 tablet (0.4 mg total) under the tongue every 5 (five) minutes as needed for chest pain (CP or SOB). 03/09/19  Yes Marin Olp, MD  Omega-3 Fatty Acids (FISH OIL) 1000 MG CAPS Take 1,000 mg by mouth daily.    Yes [provider]  omeprazole (PRILOSEC) 20 MG capsule Take 1 capsule (20 mg total) by mouth daily. 08/27/19  Yes Levin Erp, PA  simvastatin (ZOCOR) 40 MG tablet Take 1 tablet (40 mg total) by mouth at bedtime. 03/09/19  Yes Marin Olp, MD  cyclobenzaprine (FLEXERIL) 10 MG tablet Take 1 tablet (10 mg total) by mouth 3 (three) times daily as needed for muscle spasms. Patient not taking: Reported on 10/03/2019 06/25/19   Orma Flaming, MD  glucose blood (ONE TOUCH TEST STRIPS) test strip Up to 4 times daily.  Onetouch Verio.  E11.65 01/09/16   Marin Olp, MD  Lancets [facility] ULTRASOFT) lancets Up to 4 times daily.  Onetouch Verio.  [id number] 01/09/16   Marin Olp, MD    Physical Exam:  Constitutional: Obese male who appears to be in no  acute distress at this time. Vitals:   10/03/19 0645 10/03/19 0645 10/03/19 0645 10/03/19 0700  BP: 129/70  129/70 124/77  Pulse: 70   67  Resp: 16   17  Temp:  98 F (36.7 C)    TempSrc:  Oral    SpO2: 97%   96%   Eyes: PERRL, lids and conjunctivae normal ENMT: Mucous membranes are dry. Posterior pharynx clear of any exudate or lesions.   Neck: normal, supple, no masses, no thyromegaly Respiratory: Mildly decreased aeration, but no wheezes or rhonchi appreciated.  Patient able to talk in complete sentences. Cardiovascular: Regular rate and rhythm, no murmurs / rubs / gallops.  Trace bilateral lower extremity edema. 2+ pedal pulses. No carotid bruits.  Abdomen: no tenderness, no masses palpated. No hepatosplenomegaly. Bowel sounds positive.  Musculoskeletal: no clubbing / cyanosis. No joint deformity upper and lower extremities.  No step-offs appreciated in the thoracic back or signs of muscle spasm.  Fair range of motion. Skin: no rashes, lesions, ulcers. No induration Neurologic: CN 2-12 grossly intact. Sensation intact, DTR normal. Strength 5/5 in all 4.  Psychiatric: Normal judgment and insight. Alert and oriented x 3. Normal mood.     Labs on Admission: I have personally reviewed following labs and imaging studies  CBC: Recent Labs  Lab 10/03/19 0426  WBC 6.9  HGB 14.1  HCT 42.8  MCV 87.9  PLT 0000000   Basic Metabolic Panel: Recent Labs  Lab 10/03/19 0426  NA 135  K 3.7  CL 99  CO2 25  GLUCOSE 250*  BUN 14  CREATININE 1.07  CALCIUM 9.4   GFR: CrCl cannot be calculated (Unknown ideal weight.). Liver Function Tests: No results for input(s): AST, ALT, ALKPHOS, BILITOT, PROT, ALBUMIN in the last 168 hours. No results for input(s): LIPASE, AMYLASE in the last 168 hours. No results for input(s): AMMONIA in the last 168 hours. Coagulation Profile: No results for input(s): INR, PROTIME in the last 168 hours. Cardiac Enzymes: No results for input(s): CKTOTAL, CKMB,  CKMBINDEX, TROPONINI in the last 168 hours. BNP (last 3 results) No results for input(s): PROBNP in the  last 8760 hours. HbA1C: No results for input(s): HGBA1C in the last 72 hours. CBG: No results for input(s): GLUCAP in the last 168 hours. Lipid Profile: No results for input(s): CHOL, HDL, LDLCALC, TRIG, CHOLHDL, LDLDIRECT in the last 72 hours. Thyroid Function Tests: No results for input(s): TSH, T4TOTAL, FREET4, T3FREE, THYROIDAB in the last 72 hours. Anemia Panel: No results for input(s): VITAMINB12, FOLATE, FERRITIN, TIBC, IRON, RETICCTPCT in the last 72 hours. Urine analysis:    Component Value Date/Time   COLORURINE YELLOW 09/16/2018 1135   APPEARANCEUR CLEAR 09/16/2018 1135   LABSPEC >=1.030 (A) 09/16/2018 1135   PHURINE 5.0 09/16/2018 1135   GLUCOSEU NEGATIVE 09/16/2018 1135   HGBUR NEGATIVE 09/16/2018 1135   BILIRUBINUR Negative 06/25/2019 1531   KETONESUR NEGATIVE 09/16/2018 1135   PROTEINUR Negative 06/25/2019 1531   PROTEINUR NEGATIVE 01/19/2018 2034   UROBILINOGEN 0.2 06/25/2019 1531   UROBILINOGEN 0.2 09/16/2018 1135   NITRITE Negative 06/25/2019 1531   NITRITE NEGATIVE 09/16/2018 1135   LEUKOCYTESUR Negative 06/25/2019 1531   Sepsis Labs: No results found for this or any previous visit (from the past 240 hour(s)).   Radiological Exams on Admission: DG Chest 2 View  Result Date: 10/03/2019 CLINICAL DATA:  Left chest pain, shortness of breath EXAM: CHEST - 2 VIEW COMPARISON:  06/25/2019 FINDINGS: Lungs are clear.  No pleural effusion or pneumothorax. The heart is normal in size. Visualized osseous structures are within normal limits. IMPRESSION: Normal chest radiographs. Electronically Signed   By: Julian Hy M.D.   On: 10/03/2019 04:54    EKG: Independently reviewed.  Sinus rhythm at 85 bpm with first-degree heart block and abnormal R-wave transition to previous tracing  Assessment/Plan Chest pain: Patient presents with complaints of chest pain that  is centralized and will radiate to his left side.  Notes improvement with nitroglycerin.  Initial troponin was negative and EKG appeared similar to previous.  Patient had nonobstructive left heart cath in 2017with, and had exercise stress test in 05/2019.  Risk factors include previous history of tobacco use, hyperlipidemia, hypertension, and family history of heart disease.  Patient has heart score of at least 5. -Admit to a telemetry bed -Check lipid panel and hemoglobin A1c -Continue to trend cardiac troponin -Continue aspirin -Appreciate cardiology consultative services, we will follow-up for further recommendations. -Follow-up echocardiogram -Cardiology cancelled coronary CT and plan to take for cardiac cath in a.m. -N.p.o. after midnight -Check echocardiogram  Dyspnea: Acute on chronic.  Patient reports progressively worsening short of breath with walking, and now even with standing still or talking.  Chest x-ray was otherwise noted to clear.  He reports having a 37.5 smoking pack year history.  This could be multifactorial in nature including patient's weight, history of tobacco use, and/or a heart issue. -Check CT scan of the chest without contrast -May benefit from a referral to pulmonology if work-up unremarkable  Nonobstructive coronary artery disease: Previous left heart cath from 2017 showed nonobstructive disease. -Continue statin and aspirin  Diabetes mellitus type 2 (uncontrolled): Patient presents with initial blood glucose elevated up to 250.  Last hemoglobin A1c from 06/25/2019 noted to be 7.3 Home medications include Metformin at 1000 mg twice daily and Amaryl 8 mg with breakfast. -Hypoglycemic protocols -Hold home regimen -CBGs before every meal with moderate SSI -Diabetic education  Essential hypertension: Blood pressures currently stable.  Home medications include losartan 100 mg daily. -Continue losartan  Hyperlipidemia: LDL was 68 which is at goal in 03/2019.  Home  regimen includes simvastatin  40 mg daily. -Continue statin   Hypothyroidism: Last TSH was 2.99 on 03/11/2019.  Uncontrolled hypothyroidism could be possible cause for some of patient's symptoms. -Check TSH -Continue levothyroxine  Back pain: Patient complains of midthoracic back pain.  Fall was in the summer 2020 where he thought he broke his leg.  No imaging done of his back recently. -Will follow-up CT   Morbid obesity: BMI 47.7 kg/m.  Given his risk factors he would benefit from bariatric surgery referral if this has not been considered.  GERD -Continue pharmacy substitution for Prilosec  DVT prophylaxis: Lovenox Code Status: Full Family Communication: No family requested to be updated Disposition Plan: Possible discharge home if work-up negative Consults called: Cardiology Admission status: Observation  Norval Morton MD [facility] Pager [phone]   If 7PM-7AM, please contact night-coverage [email or web address] Password [facility]  10/03/2019, 7:35 AM

## 2019-10-03 NOTE — Consult Note (Signed)
Cardiology Consultation:   Patient ID: Gabriel [name] MRN: [id number]; DOB: 04-18-1966  Admit date: 10/03/2019 Date of Consult: 10/03/2019  Primary Care Provider: Marin Olp, MD Primary Cardiologist: Minus Breeding, MD  Primary Electrophysiologist:  None    Patient Profile:   Gabriel [name] is a 54 y.o. male with a hx of hypertension, hyperlipidemia, moderate coronary artery disease by cardiac cath in 2017, diabetes mellitus type 2, hypothyroidism, morbid obesity, remote tobacco use, and GERD who is being seen today for the evaluation of chest pain at the request of Dr. Tamala Julian.  History of Present Illness:   Gabriel [name] is a pleasant 54 year old patient of my partner Dr. Minus Breeding who presents today for chest tightness, back pain, and new dyspnea on exertion concerning for unstable angina/anginal equivalent.  His past medical history includes LAD stenosis by cath in December 2014 LAD was 50% with a normal FFR.  He was in the emergency room in 2017 with chest discomfort and was seen in consultation, pain was felt to be atypical but did not respond to treatment with antacid, stress perfusion performed with a medium size moderate defect in the basal inferoseptal and mid anteroseptal as well as mid inferoseptal apical region after this he was cathed and was found to have a 40% LAD stenosis.  He has been seen as recently as October 2020 by Dr. Percival Spanish and was noted to have a treadmill exercise stress test with some upsloping ST segment changes that were read as indeterminate.  CT coronary angiogram was discussed but patient did not complete this test.  He notes that he has acid reflux and after starting to take Prilosec his discomfort in his chest did improve.  However today he tells me that these may be 2 separate symptoms because he feels he is doing quite well on the Prilosec but continues to have a different chest discomfort.  He tells me that over the past several weeks he  has noticed dyspnea on exertion, and is having difficulty even walking to the mailbox during the day due to dyspnea.  He denies PND or orthopnea and denies significant leg swelling, but does note that he has to sleep upright in a chair due to symptoms of GERD.  He is now noticing over the past several days that with his dyspnea on exertion he is having chest discomfort, and can even have chest tightness that radiates to his left side and his back at rest.  Chest pain is nitro responsive.  With the shortness of breath the patient wonders if he has a pneumonia but denies cough productive of sputum, fevers.  He does have a mild sore throat.  Covid and flu test are negative.  He snores, has sleep apnea, but does not use his CPAP.  We discussed the implications of untreated sleep apnea on overall cardiovascular health.  Home meds for nonobstructive CAD include as needed nitroglycerin, aspirin 81 mg daily, losartan 100 mg daily, and simvastatin 40 mg daily.  LDL is 65 at last check with an HDL of 36.  HbA1c 6.7 on oral hypoglycemic agents.  Labs today sodium 135, potassium 3.7, creatinine 1.07, unremarkable high-sensitivity troponins of 2 and 3, BNP 21.6, hemoglobin 14.1, platelets 246,000, white blood cell count 6.9, TSH in July 2.99.  Heart Pathway Score:  HEAR Score: 5  Past Medical History:  Diagnosis Date  . Arthritis    "joints; from where I've had surgeries" (08/17/2013)  . Chest pain    a. 08/2013 Cardia  CTA: Ca score 238 (95%), LM nl, LAD mod stenosis in D2 area, D1 mild ost stenosis, D2 no signif dzs, LCX small, RCA nl.  . Coronary artery disease    LHC (08/18/13):  pLAD 50, CFX and RCA normal.  EF 55-65%.  LAD lesion FFR:  0.88 (normal).  Med rx recommended.    [name] Dysphagia    Upper endoscopy 11/2013 - without obvious stricture s/p Maloney dilation.   [name] GERD (gastroesophageal reflux disease)   . Hyperlipidemia    a. Dx 3y ago - not on statin.  [name] Hypertension    a. Dx 3y ago.  [name]  Hypothyroidism    a. on replacement.  . IBS (irritable bowel syndrome)    colonoscopy 2000  . Obesity   . Sleep apnea    does not use CPAP  . Snoring   . Type II diabetes mellitus ([place])     Past Surgical History:  Procedure Laterality Date  . ANKLE SURGERY Right    "had a growth in it; cut it out" (08/17/2013)  . CARDIAC CATHETERIZATION N/A 09/28/2015   Procedure: Left Heart Cath and Coronary Angiography;  Surgeon: Peter M Martinique, MD;  Location: [facility];  Service: Cardiovascular;  Laterality: N/A;  . CHOLECYSTECTOMY    . KNEE ARTHROSCOPY Right X 2  . LEFT HEART CATHETERIZATION WITH CORONARY ANGIOGRAM N/A 08/18/2013   Procedure: LEFT HEART CATHETERIZATION WITH CORONARY ANGIOGRAM;  Surgeon: Peter M Martinique, MD;  Location: [facility] CATH [facility];  Service: Cardiovascular;  Laterality: N/A;  . SHOULDER ARTHROSCOPY W/ ROTATOR CUFF REPAIR Left      Home Medications:  Prior to Admission medications   Medication Sig Start Date End Date Taking? Authorizing Provider  acetaminophen (TYLENOL) 500 MG tablet Take 1,000 mg by mouth every 6 (six) hours as needed for mild pain or moderate pain. Reported on 01/09/2016   Yes [provider]  aspirin EC 81 MG EC tablet Take 1 tablet (81 mg total) by mouth daily. 08/10/13  Yes Theora Gianotti, NP  glimepiride (AMARYL) 4 MG tablet TAKE 2 TABLET BY MOUTH ONCE DAILY BEFORE  BREAKFAST Patient taking differently: Take 8 mg by mouth daily with breakfast.  08/10/19  Yes Marin Olp, MD  levothyroxine (SYNTHROID) 100 MCG tablet Take 1 tablet by mouth once daily Patient taking differently: Take 100 mcg by mouth daily before breakfast.  04/28/19  Yes Marin Olp, MD  losartan (COZAAR) 100 MG tablet Take 1 tablet by mouth once daily Patient taking differently: Take 100 mg by mouth daily.  04/28/19  Yes Marin Olp, MD  metFORMIN (GLUCOPHAGE) 1000 MG tablet Take 1 tablet (1,000 mg total) by mouth 2 (two) times daily with a meal. 03/09/19   Yes Marin Olp, MD  Multiple Vitamins-Minerals (MULTIVITAMIN,TX-MINERALS) tablet Take 1 tablet by mouth daily.     Yes [provider]  nitroGLYCERIN (NITROSTAT) 0.4 MG SL tablet Place 1 tablet (0.4 mg total) under the tongue every 5 (five) minutes as needed for chest pain (CP or SOB). 03/09/19  Yes Marin Olp, MD  Omega-3 Fatty Acids (FISH OIL) 1000 MG CAPS Take 1,000 mg by mouth daily.    Yes [provider]  omeprazole (PRILOSEC) 20 MG capsule Take 1 capsule (20 mg total) by mouth daily. 08/27/19  Yes Levin Erp, PA  simvastatin (ZOCOR) 40 MG tablet Take 1 tablet (40 mg total) by mouth at bedtime. 03/09/19  Yes Marin Olp, MD  cyclobenzaprine (FLEXERIL) 10  MG tablet Take 1 tablet (10 mg total) by mouth 3 (three) times daily as needed for muscle spasms. Patient not taking: Reported on 10/03/2019 06/25/19   Orma Flaming, MD  glucose blood (ONE TOUCH TEST STRIPS) test strip Up to 4 times daily.  Onetouch Verio.  E11.65 01/09/16   Marin Olp, MD  Lancets [facility] ULTRASOFT) lancets Up to 4 times daily.  Onetouch Verio.  [id number] 01/09/16   Marin Olp, MD    Inpatient Medications: Scheduled Meds: . enoxaparin (LOVENOX) injection  40 mg Subcutaneous Q24H  . insulin aspart  0-15 Units Subcutaneous TID WC  . insulin aspart  0-5 Units Subcutaneous QHS  . levothyroxine  100 mcg Oral Q0600  . losartan  100 mg Oral Daily  . nitroGLYCERIN  0.8 mg Sublingual Once  . pantoprazole  40 mg Oral Daily  . simvastatin  40 mg Oral QHS   Continuous Infusions: . sodium chloride     PRN Meds: acetaminophen, alum & mag hydroxide-simeth, nitroGLYCERIN, ondansetron (ZOFRAN) IV  Allergies:   No Known Allergies  Social History:   Social History   Socioeconomic History  . Marital status: Single    Spouse name: Not on file  . Number of children: 0  . Years of education: Not on file  . Highest education level: Not on file  Occupational History    . Occupation: Unemployed  Tobacco Use  . Smoking status: Former Smoker    Packs/day: 1.50    Years: 24.00    Pack years: 36.00    Types: Cigarettes    Quit date: 09/10/2007    Years since quitting: 12.0  . Smokeless tobacco: Never Used  Substance and Sexual Activity  . Alcohol use: No  . Drug use: No  . Sexual activity: Yes  Other Topics Concern  . Not on file  Social History Narrative   Lives in [place] with parents (cares for parents).  Never married. Unemployed.     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[employer] (stops intermittently while caring for parents)   Social Determinants of Health   Financial Resource Strain:   . Difficulty of Paying Living Expenses: Not on file  Food Insecurity:   . Worried About Charity fundraiser in the Last Year: Not on file  . Ran Out of Food in the Last Year: Not on file  Transportation Needs:   . Lack of Transportation (Medical): Not on file  . Lack of Transportation (Non-Medical): Not on file  Physical Activity:   . Days of Exercise per Week: Not on file  . Minutes of Exercise per Session: Not on file  Stress:   . Feeling of Stress : Not on file  Social Connections:   . Frequency of Communication with Friends and Family: Not on file  . Frequency of Social Gatherings with Friends and Family: Not on file  . Attends Religious Services: Not on file  . Active Member of Clubs or Organizations: Not on file  . Attends Archivist Meetings: Not on file  . Marital Status: Not on file  Intimate Partner Violence:   . Fear of Current or Ex-Partner: Not on file  . Emotionally Abused: Not on file  . Physically Abused: Not on file  . Sexually Abused: Not on file    Family History:    Family History  Adopted: Yes  Problem Relation Age of Onset  . Hypertension Mother   . Obesity Mother   . Diabetes Mother   . Coronary  artery disease Father        s/p MI in his early 30's->CABG x 5 @ age 74, currently 49.  . Diabetes Father   . Stroke  Father   . Hypertension Father   . Diabetes Brother   . Obesity Brother   . Asthma Maternal Grandfather   . Colon cancer Neg Hx   . Esophageal cancer Neg Hx   . Inflammatory bowel disease Neg Hx   . Liver disease Neg Hx   . Pancreatic cancer Neg Hx   . Rectal cancer Neg Hx   . Stomach cancer Neg Hx      ROS:  Please see the history of present illness.   All other ROS reviewed and negative.     Physical Exam/Data:   Vitals:   10/03/19 0645 10/03/19 0645 10/03/19 0645 10/03/19 0700  BP: 129/70  129/70 124/77  Pulse: 70   67  Resp: 16   17  Temp:  98 F (36.7 C)    TempSrc:  Oral    SpO2: 97%   96%   No intake or output data in the 24 hours ending 10/03/19 0802 Last 3 Weights 08/10/2019 06/25/2019 06/10/2019  Weight (lbs) 323 lb 323 lb 325 lb 6.4 oz  Weight (kg) 146.512 kg 146.512 kg 147.6 kg     There is no height or weight on file to calculate BMI.  General:  Well nourished, well developed, in no acute distress HEENT: normal Neck: no JVD Vascular: No carotid bruits Cardiac:  normal S1, S2; RRR; no murmur  Lungs:  clear to auscultation bilaterally, no wheezing, rhonchi or rales  Abd: soft, nontender, no hepatomegaly  Ext: no edema Musculoskeletal:  No deformities, BUE and BLE strength normal and equal Skin: warm and dry  Neuro:  CNs 2-12 intact, no focal abnormalities noted Psych:  Normal affect   EKG:  The EKG was personally reviewed and demonstrates: SR, early transition r wave progression, borderline LAD Telemetry:  Telemetry was personally reviewed and demonstrates:  sr  Relevant CV Studies: Echo pending  ETT 06/01/2019  Upsloping ST segment depression ST segment depression of 1 mm was noted during stress in the II, III, aVF, V5 and V6 leads. Nondiagnostic for ischemia.  Exercise time 6 minutes-decreased conditioning.  Normal overall blood pressure response with peak blood pressure [id number] systolic.  Overall low to intermediate risk exercise treadmill test  with nondiagnostic upsloping ST segment depression, decreased exercise tolerance, normal blood pressure response.  Cath 09/28/2015:  Mid LAD lesion, 40% stenosed.  The left ventricular systolic function is normal.   1. Nonobstructive CAD  2. Normal LV function 3. Falsely positive nuclear stress test  Plan: Weight loss and regular aerobic exercise.  Laboratory Data:  High Sensitivity Troponin:   Recent Labs  Lab 10/03/19 0426 10/03/19 0647  TROPONINIHS 2 3     Chemistry Recent Labs  Lab 10/03/19 0426  NA 135  K 3.7  CL 99  CO2 25  GLUCOSE 250*  BUN 14  CREATININE 1.07  CALCIUM 9.4  GFRNONAA >60  GFRAA >60  ANIONGAP 11    No results for input(s): PROT, ALBUMIN, AST, ALT, ALKPHOS, BILITOT in the last 168 hours. Hematology Recent Labs  Lab 10/03/19 0426  WBC 6.9  RBC 4.87  HGB 14.1  HCT 42.8  MCV 87.9  MCH 29.0  MCHC 32.9  RDW 12.8  PLT 246   BNP Recent Labs  Lab 10/03/19 0431  BNP 21.6    DDimer No results  for input(s): DDIMER in the last 168 hours.   Radiology/Studies:  DG Chest 2 View  Result Date: 10/03/2019 CLINICAL DATA:  Left chest pain, shortness of breath EXAM: CHEST - 2 VIEW COMPARISON:  06/25/2019 FINDINGS: Lungs are clear.  No pleural effusion or pneumothorax. The heart is normal in size. Visualized osseous structures are within normal limits. IMPRESSION: Normal chest radiographs. Electronically Signed   By: Julian Hy M.D.   On: 10/03/2019 04:54    [id number] HEAR Score (for undifferentiated chest pain):  HEAR Score: 5    Assessment and Plan:   Chest pain and dyspnea on exertion-this is somewhat concerning for an anginal equivalent and unstable angina.  He tells me the dyspnea on exertion is new as of the past few weeks and he has difficulty walking out to the mailbox without shortness of breath.  His troponins are unremarkable and ECG nonischemic, however he has a known at least 40 to 50% LAD lesion over cast in 2014 in 2017.   Consult was requested to assist with scheduling a CT coronary angiogram as noted in prior outpatient notes.  However I am concerned that with an anginal equivalent and concerns of possible unstable angina with chest tightness at rest which he is endorsing over the past several days, it may be most appropriate to do a cardiac catheterization.  With his history of untreated sleep apnea and obesity, I would like to obtain an echocardiogram first to ensure that we are not dealing with pulmonary hypertension and right heart dysfunction contributing to his shortness of breath.  This will also help [place] determine if there is LV dysfunction with the shortness of breath.  He is endorsing NYHA class III symptoms, but fortunately appears euvolemic on exam.  We will obtain an echocardiogram today and consider coronary angiography based on the results for tomorrow.  I have made the patient n.p.o. after midnight so that this can be discussed further and planned.  Patient will be admitted overnight by the hospitalist service, nitroglycerin as needed for chest pain.  Hyperlipidemia-continue simvastatin, lipids are currently stable.  Hypertension-blood pressure stable today, continue losartan  Diabetes-per internal medicine  Sleep apnea-encouraged compliance with CPAP, may need repeat sleep study as an outpatient.  GERD-continue Prilosec as this is working well for him.       For questions or updates, please contact [place] Please consult [email or web address] for contact info under     Signed, Elouise Munroe, MD  10/03/2019 8:02 AM

## 2019-10-03 NOTE — Progress Notes (Signed)
  Echocardiogram 2D Echocardiogram has been performed with [facility].  Gabriel [name] 10/03/2019, 2:42 PM

## 2019-10-04 ENCOUNTER — Inpatient Hospital Stay (HOSPITAL_COMMUNITY)
Admission: EM | Disposition: A | Discharge status: Home / Self Care | Payer: Self-pay | Source: Home / Self Care | Attending: Family Medicine

## 2019-10-04 ENCOUNTER — Other Ambulatory Visit: Payer: Self-pay

## 2019-10-04 DIAGNOSIS — I10 Essential (primary) hypertension: Secondary | ICD-10-CM

## 2019-10-04 DIAGNOSIS — E1169 Type 2 diabetes mellitus with other specified complication: Secondary | ICD-10-CM

## 2019-10-04 DIAGNOSIS — I25110 Atherosclerotic heart disease of native coronary artery with unstable angina pectoris: Principal | ICD-10-CM

## 2019-10-04 DIAGNOSIS — R079 Chest pain, unspecified: Secondary | ICD-10-CM | POA: Diagnosis present

## 2019-10-04 DIAGNOSIS — E785 Hyperlipidemia, unspecified: Secondary | ICD-10-CM

## 2019-10-04 HISTORY — PX: LEFT HEART CATH AND CORONARY ANGIOGRAPHY: CATH118249

## 2019-10-04 HISTORY — PX: INTRAVASCULAR PRESSURE WIRE/FFR STUDY: CATH118243

## 2019-10-04 HISTORY — PX: CORONARY STENT INTERVENTION: CATH118234

## 2019-10-04 LAB — D-DIMER, QUANTITATIVE: D-Dimer, Quant: <0.27 ug/mL-FEU (ref 0.00–0.50)

## 2019-10-04 LAB — GLUCOSE, CAPILLARY
Glucose-Capillary: 170 mg/dL — ABNORMAL HIGH (ref 70–99)
Glucose-Capillary: 173 mg/dL — ABNORMAL HIGH (ref 70–99)
Glucose-Capillary: 177 mg/dL — ABNORMAL HIGH (ref 70–99)
Glucose-Capillary: 170 mg/dL — ABNORMAL HIGH (ref 70–99)
Glucose-Capillary: 162 mg/dL — ABNORMAL HIGH (ref 70–99)

## 2019-10-04 LAB — SURGICAL PCR SCREEN
MRSA, PCR: NEGATIVE
Staphylococcus aureus: NEGATIVE

## 2019-10-04 SURGERY — LEFT HEART CATH AND CORONARY ANGIOGRAPHY
Anesthesia: LOCAL

## 2019-10-04 MED ORDER — VERAPAMIL HCL 2.5 MG/ML IV SOLN
INTRAVENOUS | Status: DC | PRN
  Administered 2019-10-04: 10 mL via INTRA_ARTERIAL

## 2019-10-04 MED ORDER — HEPARIN (PORCINE) IN NACL 1000-0.9 UT/500ML-% IV SOLN
INTRAVENOUS | Status: DC | PRN
  Administered 2019-10-04: 500 mL

## 2019-10-04 MED ORDER — LEVOTHYROXINE SODIUM 25 MCG PO TABS
125.0000 ug | ORAL_TABLET | Freq: Every day | ORAL | Status: DC
  Administered 2019-10-05: 125 ug via ORAL
  Filled 2019-10-04: qty 1

## 2019-10-04 MED ORDER — SODIUM CHLORIDE 0.9% FLUSH: 3.0000 mL | INTRAVENOUS | Status: DC | PRN

## 2019-10-04 MED ORDER — LABETALOL HCL 5 MG/ML IV SOLN: 10.0000 mg | INTRAVENOUS | Status: AC | PRN

## 2019-10-04 MED ORDER — HEPARIN SODIUM (PORCINE) 1000 UNIT/ML IJ SOLN
INTRAMUSCULAR | Status: DC | PRN
  Administered 2019-10-04: 4000 [IU] via INTRAVENOUS
  Administered 2019-10-04: 3000 [IU] via INTRAVENOUS
  Administered 2019-10-04: 5000 [IU] via INTRAVENOUS
  Administered 2019-10-04: 9000 [IU] via INTRAVENOUS

## 2019-10-04 MED ORDER — HEPARIN (PORCINE) IN NACL 1000-0.9 UT/500ML-% IV SOLN
INTRAVENOUS | Status: AC
  Filled 2019-10-04: qty 1000

## 2019-10-04 MED ORDER — ASPIRIN 81 MG PO CHEW
81.0000 mg | CHEWABLE_TABLET | Freq: Every day | ORAL | Status: DC
  Administered 2019-10-05: 10:00:00 81 mg via ORAL
  Filled 2019-10-04: qty 1

## 2019-10-04 MED ORDER — FENTANYL CITRATE (PF) 100 MCG/2ML IJ SOLN
INTRAMUSCULAR | Status: AC
  Filled 2019-10-04: qty 2

## 2019-10-04 MED ORDER — ASPIRIN 81 MG PO TBEC: 81.0000 mg | DELAYED_RELEASE_TABLET | Freq: Every day | ORAL | Status: DC

## 2019-10-04 MED ORDER — HYDRALAZINE HCL 20 MG/ML IJ SOLN: 10.0000 mg | INTRAMUSCULAR | Status: AC | PRN

## 2019-10-04 MED ORDER — SODIUM CHLORIDE 0.9% FLUSH
3.0000 mL | Freq: Two times a day (BID) | INTRAVENOUS | Status: DC
  Administered 2019-10-04 – 2019-10-05 (×2): 3 mL via INTRAVENOUS

## 2019-10-04 MED ORDER — TICAGRELOR 90 MG PO TABS
ORAL_TABLET | ORAL | Status: AC
  Filled 2019-10-04: qty 2

## 2019-10-04 MED ORDER — ADENOSINE (DIAGNOSTIC) 140MCG/KG/MIN
INTRAVENOUS | Status: DC | PRN
  Administered 2019-10-04: 140 ug/kg/min via INTRAVENOUS

## 2019-10-04 MED ORDER — LIDOCAINE HCL (PF) 1 % IJ SOLN
INTRAMUSCULAR | Status: DC | PRN
  Administered 2019-10-04: 2 mL

## 2019-10-04 MED ORDER — SODIUM CHLORIDE 0.9 % IV SOLN: 250.0000 mL | INTRAVENOUS | Status: DC | PRN

## 2019-10-04 MED ORDER — TICAGRELOR 90 MG PO TABS
ORAL_TABLET | ORAL | Status: DC | PRN
  Administered 2019-10-04: 180 mg via ORAL

## 2019-10-04 MED ORDER — SODIUM CHLORIDE 0.9 % WEIGHT BASED INFUSION: 3.0000 mL/kg/h | INTRAVENOUS | Status: DC

## 2019-10-04 MED ORDER — NITROGLYCERIN 1 MG/10 ML FOR IR/CATH LAB
INTRA_ARTERIAL | Status: AC
  Filled 2019-10-04: qty 10

## 2019-10-04 MED ORDER — SODIUM CHLORIDE 0.9 % IV SOLN: INTRAVENOUS | Status: AC

## 2019-10-04 MED ORDER — ATORVASTATIN CALCIUM 80 MG PO TABS
80.0000 mg | ORAL_TABLET | Freq: Every day | ORAL | Status: DC
  Administered 2019-10-04: 80 mg via ORAL
  Filled 2019-10-04: qty 1

## 2019-10-04 MED ORDER — HEPARIN SODIUM (PORCINE) 1000 UNIT/ML IJ SOLN
INTRAMUSCULAR | Status: AC
  Filled 2019-10-04: qty 1

## 2019-10-04 MED ORDER — CLOPIDOGREL BISULFATE 300 MG PO TABS: ORAL_TABLET | ORAL | Status: DC | PRN

## 2019-10-04 MED ORDER — TICAGRELOR 90 MG PO TABS
90.0000 mg | ORAL_TABLET | Freq: Two times a day (BID) | ORAL | Status: DC
  Administered 2019-10-05: 07:00:00 90 mg via ORAL
  Filled 2019-10-04: qty 1

## 2019-10-04 MED ORDER — SODIUM CHLORIDE 0.9 % WEIGHT BASED INFUSION: 1.0000 mL/kg/h | INTRAVENOUS | Status: DC

## 2019-10-04 MED ORDER — MIDAZOLAM HCL 2 MG/2ML IJ SOLN
INTRAMUSCULAR | Status: AC
  Filled 2019-10-04: qty 2

## 2019-10-04 MED ORDER — ONDANSETRON HCL 4 MG/2ML IJ SOLN
INTRAMUSCULAR | Status: AC
  Filled 2019-10-04: qty 2

## 2019-10-04 MED ORDER — MIDAZOLAM HCL 2 MG/2ML IJ SOLN
INTRAMUSCULAR | Status: DC | PRN
  Administered 2019-10-04: 2 mg via INTRAVENOUS

## 2019-10-04 MED ORDER — ONDANSETRON HCL 4 MG/2ML IJ SOLN: 4.0000 mg | Freq: Four times a day (QID) | INTRAMUSCULAR | Status: DC | PRN

## 2019-10-04 MED ORDER — ADENOSINE 12 MG/4ML IV SOLN
INTRAVENOUS | Status: AC
  Filled 2019-10-04: qty 16

## 2019-10-04 MED ORDER — LIDOCAINE HCL (PF) 1 % IJ SOLN
INTRAMUSCULAR | Status: AC
  Filled 2019-10-04: qty 30

## 2019-10-04 MED ORDER — ACETAMINOPHEN 325 MG PO TABS: 650.0000 mg | ORAL_TABLET | ORAL | Status: DC | PRN

## 2019-10-04 MED ORDER — VERAPAMIL HCL 2.5 MG/ML IV SOLN
INTRAVENOUS | Status: AC
  Filled 2019-10-04: qty 2

## 2019-10-04 MED ORDER — FENTANYL CITRATE (PF) 100 MCG/2ML IJ SOLN
INTRAMUSCULAR | Status: DC | PRN
  Administered 2019-10-04: 25 ug via INTRAVENOUS

## 2019-10-04 MED ORDER — ONDANSETRON HCL 4 MG/2ML IJ SOLN
INTRAMUSCULAR | Status: DC | PRN
  Administered 2019-10-04: 4 mg via INTRAVENOUS

## 2019-10-04 MED ORDER — IOHEXOL 350 MG/ML SOLN
INTRAVENOUS | Status: DC | PRN
  Administered 2019-10-04: 135 mL

## 2019-10-04 MED ORDER — ASPIRIN 81 MG PO CHEW: 81.0000 mg | CHEWABLE_TABLET | ORAL | Status: DC

## 2019-10-04 MED FILL — Perflutren Lipid Microsphere IV Susp 1.1 MG/ML: INTRAVENOUS | Qty: 10 | Status: AC

## 2019-10-04 SURGICAL SUPPLY — 21 items
BALLN SAPPHIRE 2.5X15 (BALLOONS) ×2
BALLN SAPPHIRE ~~LOC~~ 3.5X15 (BALLOONS) ×1 IMPLANT
BALLOON SAPPHIRE 2.5X15 (BALLOONS) IMPLANT
CATH 5FR JL3.5 JR4 ANG PIG MP (CATHETERS) ×1 IMPLANT
CATH LAUNCHER 6FR EBU 3 (CATHETERS) ×1 IMPLANT
CATH LAUNCHER 6FR EBU3.5 (CATHETERS) ×1 IMPLANT
CATH MICROCATH NAVVUS (MICROCATHETER) IMPLANT
DEVICE RAD COMP TR BAND LRG (VASCULAR PRODUCTS) ×1 IMPLANT
GLIDESHEATH SLEND SS 6F .021 (SHEATH) ×1 IMPLANT
GUIDEWIRE INQWIRE 1.5J.035X260 (WIRE) IMPLANT
INQWIRE 1.5J .035X260CM (WIRE) ×2
KIT ENCORE 26 ADVANTAGE (KITS) ×1 IMPLANT
KIT HEART LEFT (KITS) ×2 IMPLANT
MICROCATHETER NAVVUS (MICROCATHETER) ×4
PACK CARDIAC CATHETERIZATION (CUSTOM PROCEDURE TRAY) ×2 IMPLANT
STENT SYNERGY XD 3.0X20 (Permanent Stent) IMPLANT
SYNERGY XD 3.0X20 (Permanent Stent) ×2 IMPLANT
SYR MEDRAD MARK 7 150ML (SYRINGE) ×2 IMPLANT
TRANSDUCER W/STOPCOCK (MISCELLANEOUS) ×2 IMPLANT
TUBING CIL FLEX 10 FLL-RA (TUBING) ×2 IMPLANT
WIRE ASAHI PROWATER 180CM (WIRE) ×1 IMPLANT

## 2019-10-04 NOTE — Progress Notes (Signed)
PROGRESS NOTE    Gabriel [name]  [id number] DOB: [birth year] DOA: 10/03/2019 PCP: Marin Olp, MD   Brief Narrative:  Gabriel [name] is a 54 y.o. male with medical history significant of hypertension, hyperlipidemia, nonobstructive coronary artery disease by cardiac cath in 2017, diabetes mellitus type 2, hypothyroidism, morbid obesity, remote tobacco use, and GERD.  He presents with complaints of intermittent chest pain over the last 3 days.  Chest pain is centrally located and we will radiate over to the left side.  He characterizes the pain as dull, but has intermittently been sharp in nature.  Pain usually eases off when he takes nitroglycerin, but will return.  Last cardiac cath was in 2017 and showed nonobstructive disease.  He had exercise stress test in September 2020 that was stopped early due to shortness of breath and fatigue.  Over the last several weeks to months he has had progressively worsening shortness of breath.  Previously only occurred with walking, but now even occurs with standing or talking.  He has a family history of early heart disease and previously smoked 1/2 pack of cigarettes per day on average for 25 years, but quit about 12 years ago.  He also notes complaints of midline thoracic back pain, left leg tightness, and sore throat over the last 4 to 6 weeks.  Denies any recent falls or traumas as the cause of his back pain, but did report falling outside in the grass in the summer 2020.  He does note that he has had more urgency with his bladder/bowels.  He had a Covid test weeks ago that was negative as it relates to his sore throat.  Blood sugars have been up and down with sugars being greater than 250 at times. Over the holidays he admits that he may not have eaten like he should at all times.  He does not drink sodas, but reports that his weakness is breads and pastas.  ED Course: Upon admission into the emergency department patient was seen to have vital  signs relatively within normal limits.  Labs significant for glucose 250 and initial troponin negative.  Chest x-ray was within normal limits.  EKG did not show any significant ischemic changes from the previous tracing.  TRH called to admit.  Assessment & Plan:   Principal Problem:   Chest pain Active Problems:   Essential hypertension   Hypothyroidism   Hyperlipidemia associated with type 2 diabetes mellitus (HCC)   Morbid obesity (HCC)   GERD (gastroesophageal reflux disease)   Dyspnea   Back pain   Chest pain/dyspnea on exertion/history of nonobstructive CAD: Cardiology on board.  Per cardiology, symptoms are angina equivocal.  Patient still complains of constant chest pain and he describes it as burning.  According to him, it is located at the left anterior chest.  Denies any shortness of breath at rest but has significant shortness of breath with daily activity now.  Troponins negative.  Echo with no wall motion abnormality and normal ejection fraction.  Per cardiology, plan is for cardiac catheterization however I am also concerned about possible PE as he is complaining of bilateral calf pain.  Will obtain stat D-dimer, if elevated then we should proceed with Doppler lower extremity and perhaps CT angiogram as well.  Continue aspirin and statin.  Type 2 diabetes mellitus: Takes Metformin and Amaryl at home.  Last hemoglobin A1c 7.3 in October 2020 and now 8.1.  Blood sugar fairly controlled.  Hold home regimen continue SSI.  Essential hypertension: Blood pressure controlled.  He is on losartan.  Hyperlipidemia: Continue statin.  Acquired hypothyroidism: Takes Synthroid 100 mcg at home.  TSH likely related.  Will increase Synthroid to 125 mcg.   Hypothyroidism: Last TSH was 2.99 on 03/11/2019.  Uncontrolled hypothyroidism could be possible cause for some of patient's symptoms. -Check TSH -Continue levothyroxine  Chronic back pain: CT chest negative.  Morbid obesity: BMI 47.7  kg/m.  Given his risk factors he would benefit from bariatric surgery referral if this has not been considered.  GERD -Continue pharmacy substitution for Prilosec  DVT prophylaxis: Lovenox Code Status: Full code Family Communication:  None present at bedside.  Plan of care discussed with patient in length and he verbalized understanding and agreed with it. Disposition Plan: Potential discharge home tomorrow.  Cardiac catheterization and further work-up for possible PE/DVT is pending.  Estimated body mass index is 47.7 kg/m as calculated from the following:   Height as of 08/10/19: 5\' 9"  (1.753 m).   Weight as of 08/10/19: 146.5 kg.      Nutritional status:               Consultants:   Cardiology  Procedures:   None  Antimicrobials:   None   Subjective: Seen and examined.  Still complains of chest pain but he describes it as burning.  No shortness of breath at rest.  No other complaint.  Objective: Vitals:   10/03/19 0945 10/03/19 1052 10/03/19 2227 10/04/19 0602  BP: (!) 93/56 125/73 122/80 106/64  Pulse: 73 75 67 68  Resp: 11 18 17 16   Temp:  (!) 97.5 F (36.4 C) 97.9 F (36.6 C) 98 F (36.7 C)  TempSrc:  Oral Oral Oral  SpO2: 93% 96% 96% 96%    Intake/Output Summary (Last 24 hours) at 10/04/2019 1151 Last data filed at 10/03/2019 1700 Gross per 24 hour  Intake 240 ml  Output --  Net 240 ml   There were no vitals filed for this visit.  Examination:  General exam: Appears calm and comfortable  Respiratory system: Clear to auscultation. Respiratory effort normal. Cardiovascular system: S1 & S2 heard, RRR. No JVD, murmurs, rubs, gallops or clicks.  Trace pitting edema bilateral lower extremity Gastrointestinal system: Abdomen is nondistended, soft and nontender. No organomegaly or masses felt. Normal bowel sounds heard. Central nervous system: Alert and oriented. No focal neurological deficits. Extremities: Symmetric 5 x 5 power.  Very mild  bilateral calf tenderness. Skin: No rashes, lesions or ulcers Psychiatry: Judgement and insight appear normal. Mood & affect appropriate.    Data Reviewed: I have personally reviewed following labs and imaging studies  CBC: Recent Labs  Lab 10/03/19 0426  WBC 6.9  HGB 14.1  HCT 42.8  MCV 87.9  PLT 0000000   Basic Metabolic Panel: Recent Labs  Lab 10/03/19 0426  NA 135  K 3.7  CL 99  CO2 25  GLUCOSE 250*  BUN 14  CREATININE 1.07  CALCIUM 9.4   GFR: CrCl cannot be calculated (Unknown ideal weight.). Liver Function Tests: No results for input(s): AST, ALT, ALKPHOS, BILITOT, PROT, ALBUMIN in the last 168 hours. No results for input(s): LIPASE, AMYLASE in the last 168 hours. No results for input(s): AMMONIA in the last 168 hours. Coagulation Profile: No results for input(s): INR, PROTIME in the last 168 hours. Cardiac Enzymes: No results for input(s): CKTOTAL, CKMB, CKMBINDEX, TROPONINI in the last 168 hours. BNP (last 3 results) No results for input(s): PROBNP in the last  8760 hours. HbA1C: Recent Labs    10/03/19 1205  HGBA1C 8.1*   CBG: Recent Labs  Lab 10/03/19 1106 10/03/19 1546 10/03/19 2220 10/04/19 0742 10/04/19 1139  GLUCAP 199* 213* 176* 170* 173*   Lipid Profile: Recent Labs    10/03/19 0844  CHOL 139  HDL 32*  LDLCALC 75  TRIG 161*  CHOLHDL 4.3   Thyroid Function Tests: Recent Labs    10/03/19 0844  TSH 6.628*   Anemia Panel: No results for input(s): VITAMINB12, FOLATE, FERRITIN, TIBC, IRON, RETICCTPCT in the last 72 hours. Sepsis Labs: No results for input(s): PROCALCITON, LATICACIDVEN in the last 168 hours.  Recent Results (from the past 240 hour(s))  Respiratory Panel by RT PCR (Flu A&B, Covid) - Nasopharyngeal Swab     Status: None   Collection Time: 10/03/19  6:59 AM   Specimen: Nasopharyngeal Swab  Result Value Ref Range Status   SARS Coronavirus 2 by RT PCR NEGATIVE NEGATIVE Final    Comment: (NOTE) SARS-CoV-2 target  nucleic acids are NOT DETECTED. The SARS-CoV-2 RNA is generally detectable in upper respiratoy specimens during the acute phase of infection. The lowest concentration of SARS-CoV-2 viral copies this assay can detect is 131 copies/mL. A negative result does not preclude SARS-Cov-2 infection and should not be used as the sole basis for treatment or other patient management decisions. A negative result may occur with  improper specimen collection/handling, submission of specimen other than nasopharyngeal swab, presence of viral mutation(s) within the areas targeted by this assay, and inadequate number of viral copies (<131 copies/mL). A negative result must be combined with clinical observations, patient history, and epidemiological information. The expected result is Negative. Fact Sheet for Patients:  PinkCheek.be Fact Sheet for Healthcare Providers:  GravelBags.it This test is not yet ap proved or cleared by the [place] FDA and  has been authorized for detection and/or diagnosis of SARS-CoV-2 by FDA under an Emergency Use Authorization (EUA). This EUA will remain  in effect (meaning this test can be used) for the duration of the COVID-19 declaration under Section 564(b)(1) of the Act, 21 U.S.C. section 360bbb-3(b)(1), unless the authorization is terminated or revoked sooner.    Influenza A by PCR NEGATIVE NEGATIVE Final   Influenza B by PCR NEGATIVE NEGATIVE Final    Comment: (NOTE) The Xpert Xpress SARS-CoV-2/FLU/RSV assay is intended as an aid in  the diagnosis of influenza from Nasopharyngeal swab specimens and  should not be used as a sole basis for treatment. Nasal washings and  aspirates are unacceptable for Xpert Xpress SARS-CoV-2/FLU/RSV  testing. Fact Sheet for Patients: PinkCheek.be Fact Sheet for Healthcare Providers: GravelBags.it This test is not  yet approved or cleared by the [place] FDA and  has been authorized for detection and/or diagnosis of SARS-CoV-2 by  FDA under an Emergency Use Authorization (EUA). This EUA will remain  in effect (meaning this test can be used) for the duration of the  Covid-19 declaration under Section 564(b)(1) of the Act, 21  U.S.C. section 360bbb-3(b)(1), unless the authorization is  terminated or revoked. Performed at [facility], [place] [address], [place], [place] [address]   Surgical pcr screen     Status: None   Collection Time: 10/04/19  1:07 AM   Specimen: Nasal Mucosa; Nasal Swab  Result Value Ref Range Status   MRSA, PCR NEGATIVE NEGATIVE Final   Staphylococcus aureus NEGATIVE NEGATIVE Final    Comment: (NOTE) The Xpert SA Assay (FDA approved for NASAL specimens in  patients 15 years of age and older), is one component of a comprehensive surveillance program. It is not intended to diagnose infection nor to guide or monitor treatment. Performed at [facility], [place] [address] [place], [place] [address]       Radiology Studies: DG Chest 2 View  Result Date: 10/03/2019 CLINICAL DATA:  Left chest pain, shortness of breath EXAM: CHEST - 2 VIEW COMPARISON:  06/25/2019 FINDINGS: Lungs are clear.  No pleural effusion or pneumothorax. The heart is normal in size. Visualized osseous structures are within normal limits. IMPRESSION: Normal chest radiographs. Electronically Signed   By: Julian Hy M.D.   On: 10/03/2019 04:54   CT CHEST WO CONTRAST  Result Date: 10/03/2019 CLINICAL DATA:  Shortness of breath, left chest pain EXAM: CT CHEST WITHOUT CONTRAST TECHNIQUE: Multidetector CT imaging of the chest was performed following the standard protocol without IV contrast. COMPARISON:  Chest radiographs dated 10/03/2019 FINDINGS: Cardiovascular: The heart is normal in size. No pericardial effusion. No evidence of thoracic aortic aneurysm. Mild atherosclerotic calcifications the  aortic root/arch. Coronary atherosclerosis of the LAD. Mediastinum/Nodes: No suspicious mediastinal lymphadenopathy. Visualized thyroid is unremarkable. Lungs/Pleura: No suspicious pulmonary nodules. No focal consolidation. No pleural effusion or pneumothorax. Upper Abdomen: Visualized upper abdomen is notable for hepatic steatosis and prior cholecystectomy. Small upper abdominal lymph nodes, including a 14 mm short axis portacaval node (series 3/image 161), likely reactive. Musculoskeletal: Mild degenerative changes of the visualized thoracolumbar spine. No rib fracture is seen. IMPRESSION: No evidence of acute cardiopulmonary disease. No CT findings to account for the patient's left chest pain. Aortic Atherosclerosis (ICD10-I70.0). Electronically Signed   By: Julian Hy M.D.   On: 10/03/2019 10:16   ECHOCARDIOGRAM COMPLETE  Result Date: 10/03/2019   ECHOCARDIOGRAM REPORT   Patient Name:   Gabriel [name] Date of Exam: 10/03/2019 Medical Rec #:  [id number]         Height:       69.0 in Accession #:    [id number]        Weight:       323.0 lb Date of Birth:  1966/06/12         BSA:          2.53 m Patient Age:    35 years          BP:           125/73 mmHg Patient Gender: M                 HR:           75 bpm. Exam Location:  Inpatient Procedure: 2D Echo and Intracardiac Opacification Agent Indications:    Dyspnea                 Chest pain  History:        Patient has no prior history of Echocardiogram examinations.                 CAD; Risk Factors:Hypertension, Dyslipidemia, Diabetes and Sleep                 Apnea.  Sonographer:    Clayton Lefort RDCS (AE) Referring Phys: [id number] Elouise Munroe  Sonographer Comments: Technically difficult study due to poor echo windows, suboptimal parasternal window, suboptimal apical window, suboptimal subcostal window and patient is morbidly obese. Image acquisition challenging due to patient body habitus and Image acquisition challenging due to respiratory motion.  IMPRESSIONS  1. Left ventricular  ejection fraction, by visual estimation, is 55 to 60%. The left ventricle has normal function. Unable to assess for left ventricular hypertrophy.  2. Definity contrast agent was given IV to delineate the left ventricular endocardial borders.  3. Left ventricular diastolic parameters are indeterminate.  4. The left ventricle has no regional wall motion abnormalities.  5. Global right ventricle has normal systolic function.The right ventricular size is mildly enlarged. No increase in right ventricular wall thickness.  6. Left atrial size was normal.  7. Right atrial size was normal.  8. The mitral valve is grossly normal. No evidence of mitral valve regurgitation.  9. The tricuspid valve is grossly normal. 10. The tricuspid valve is grossly normal. Tricuspid valve regurgitation is not demonstrated. 11. The aortic valve is tricuspid. Aortic valve regurgitation is not visualized. No evidence of aortic valve sclerosis or stenosis. 12. The pulmonic valve was not well visualized. Pulmonic valve regurgitation is not visualized. 13. The inferior vena cava is dilated in size with >50% respiratory variability, suggesting right atrial pressure of 8 mmHg. 14. The aortic root was not well visualized. FINDINGS  Left Ventricle: Left ventricular ejection fraction, by visual estimation, is 55 to 60%. The left ventricle has normal function. Definity contrast agent was given IV to delineate the left ventricular endocardial borders. The left ventricle has no regional wall motion abnormalities. There is no left ventricular hypertrophy. Left ventricular diastolic parameters are indeterminate. Normal left atrial pressure. Right Ventricle: The right ventricular size is mildly enlarged. No increase in right ventricular wall thickness. Global RV systolic function is has normal systolic function. Left Atrium: Left atrial size was normal in size. Right Atrium: Right atrial size was normal in size Pericardium:  There is no evidence of pericardial effusion. Mitral Valve: The mitral valve is grossly normal. No evidence of mitral valve regurgitation. MV peak gradient, 4.0 mmHg. Tricuspid Valve: The tricuspid valve is grossly normal. Tricuspid valve regurgitation is not demonstrated. Aortic Valve: The aortic valve is tricuspid. Aortic valve regurgitation is not visualized. The aortic valve is structurally normal, with no evidence of sclerosis or stenosis. Aortic valve mean gradient measures 3.0 mmHg. Aortic valve peak gradient measures 5.1 mmHg. Aortic valve area, by VTI measures 2.64 cm. Pulmonic Valve: The pulmonic valve was not well visualized. Pulmonic valve regurgitation is not visualized. Pulmonic regurgitation is not visualized. Aorta: The aortic root was not well visualized. Venous: The inferior vena cava is dilated in size with greater than 50% respiratory variability, suggesting right atrial pressure of 8 mmHg. IAS/Shunts: No atrial level shunt detected by color flow Doppler.  LEFT VENTRICLE PLAX 2D LVOT diam:     2.10 cm  Diastology LVOT Area:     3.46 cm LV e' lateral:   8.49 cm/s                         LV E/e' lateral: 8.1                         LV e' medial:    8.59 cm/s                         LV E/e' medial:  8.0  RIGHT VENTRICLE             IVC RV Basal diam:  3.50 cm     IVC diam: 2.30 cm RV Mid diam:    3.40 cm RV S  prime:     13.20 cm/s TAPSE (M-mode): 3.0 cm LEFT ATRIUM             Index       RIGHT ATRIUM           Index LA Vol (A2C):   49.1 ml 19.39 ml/m RA Area:     17.60 cm LA Vol (A4C):   38.7 ml 15.28 ml/m RA Volume:   46.80 ml  18.48 ml/m LA Biplane Vol: 44.0 ml 17.37 ml/m  AORTIC VALVE AV Area (Vmax):    2.98 cm AV Area (Vmean):   2.62 cm AV Area (VTI):     2.64 cm AV Vmax:           113.00 cm/s AV Vmean:          83.500 cm/s AV VTI:            0.241 m AV Peak Grad:      5.1 mmHg AV Mean Grad:      3.0 mmHg LVOT Vmax:         97.30 cm/s LVOT Vmean:        63.100 cm/s LVOT VTI:           0.184 m LVOT/AV VTI ratio: 0.76 MITRAL VALVE MV Area (PHT): 3.03 cm             SHUNTS MV Peak grad:  4.0 mmHg             Systemic VTI:  0.18 m MV Mean grad:  2.0 mmHg             Systemic Diam: 2.10 cm MV Vmax:       1.00 m/s MV Vmean:      60.8 cm/s MV VTI:        0.34 m MV PHT:        72.50 msec MV Decel Time: 250 msec MV E velocity: 68.60 cm/s 103 cm/s MV A velocity: 71.60 cm/s 70.3 cm/s MV E/A ratio:  0.96       1.5  Kate Sable MD Electronically signed by Kate Sable MD Signature Date/Time: 10/03/2019/2:50:01 PM    Final     Scheduled Meds: . enoxaparin (LOVENOX) injection  40 mg Subcutaneous Q24H  . insulin aspart  0-15 Units Subcutaneous TID WC  . insulin aspart  0-5 Units Subcutaneous QHS  . levothyroxine  100 mcg Oral Q0600  . losartan  100 mg Oral Daily  . pantoprazole  40 mg Oral Daily  . simvastatin  40 mg Oral QHS   Continuous Infusions:   LOS: 0 days   Time spent: 35 minutes   Darliss Cheney, MD [facility]  10/04/2019, 11:51 AM   To contact the attending provider between 7A-7P or the covering provider during after hours 7P-7A, please log into the web site [email or web address].

## 2019-10-04 NOTE — H&P (View-Only) (Signed)
Progress Note  Patient Name: Gabriel [name] Date of Encounter: 10/04/2019  Primary Cardiologist: Minus Breeding, MD   Subjective   Some mild pain.  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   Inpatient Medications    Scheduled Meds:  enoxaparin (LOVENOX) injection  40 mg Subcutaneous Q24H   insulin aspart  0-15 Units Subcutaneous TID WC   insulin aspart  0-5 Units Subcutaneous QHS   levothyroxine  100 mcg Oral Q0600   losartan  100 mg Oral Daily   pantoprazole  40 mg Oral Daily   simvastatin  40 mg Oral QHS   Continuous Infusions:  PRN Meds: acetaminophen, alum & mag hydroxide-simeth, nitroGLYCERIN, ondansetron (ZOFRAN) IV   Vital Signs    Vitals:   10/03/19 0945 10/03/19 1052 10/03/19 2227 10/04/19 0602  BP: (!) 93/56 125/73 122/80 106/64  Pulse: 73 75 67 68  Resp: 11 18 17 16   Temp:  (!) 97.5 F (36.4 C) 97.9 F (36.6 C) 98 F (36.7 C)  TempSrc:  Oral Oral Oral  SpO2: 93% 96% 96% 96%    Intake/Output Summary (Last 24 hours) at 10/04/2019 0947 Last data filed at 10/03/2019 1700 Gross per 24 hour  Intake 600 ml  Output --  Net 600 ml   Last 3 Weights 08/10/2019 06/25/2019 06/10/2019  Weight (lbs) 323 lb 323 lb 325 lb 6.4 oz  Weight (kg) 146.512 kg 146.512 kg 147.6 kg      Telemetry    SR - Personally Reviewed  ECG    No new - Personally Reviewed  Physical Exam   GEN: No acute distress.   Neck: No JVD Cardiac: RRR, no murmurs, rubs, or gallops.  Respiratory: Clear to auscultation bilaterally. GI: Soft, nontender, non-distended  MS: No edema; No deformity. Neuro:  Nonfocal  Psych: Normal affect   Labs    High Sensitivity Troponin:   Recent Labs  Lab 10/03/19 0426 10/03/19 0647  TROPONINIHS 2 3      Chemistry Recent Labs  Lab 10/03/19 0426  NA 135  K 3.7  CL 99  CO2 25  GLUCOSE 250*  BUN 14  CREATININE 1.07  CALCIUM 9.4  GFRNONAA >60  GFRAA >60  ANIONGAP 11     Hematology Recent Labs  Lab 10/03/19 0426  WBC 6.9   RBC 4.87  HGB 14.1  HCT 42.8  MCV 87.9  MCH 29.0  MCHC 32.9  RDW 12.8  PLT 246    BNP Recent Labs  Lab 10/03/19 0431  BNP 21.6     DDimer No results for input(s): DDIMER in the last 168 hours.   Radiology    DG Chest 2 View  Result Date: 10/03/2019 CLINICAL DATA:  Left chest pain, shortness of breath EXAM: CHEST - 2 VIEW COMPARISON:  06/25/2019 FINDINGS: Lungs are clear.  No pleural effusion or pneumothorax. The heart is normal in size. Visualized osseous structures are within normal limits. IMPRESSION: Normal chest radiographs. Electronically Signed   By: Julian Hy M.D.   On: 10/03/2019 04:54   CT CHEST WO CONTRAST  Result Date: 10/03/2019 CLINICAL DATA:  Shortness of breath, left chest pain EXAM: CT CHEST WITHOUT CONTRAST TECHNIQUE: Multidetector CT imaging of the chest was performed following the standard protocol without IV contrast. COMPARISON:  Chest radiographs dated 10/03/2019 FINDINGS: Cardiovascular: The heart is normal in size. No pericardial effusion. No evidence of thoracic aortic aneurysm. Mild atherosclerotic calcifications the aortic root/arch. Coronary atherosclerosis of the LAD. Mediastinum/Nodes: No suspicious mediastinal lymphadenopathy. Visualized  thyroid is unremarkable. Lungs/Pleura: No suspicious pulmonary nodules. No focal consolidation. No pleural effusion or pneumothorax. Upper Abdomen: Visualized upper abdomen is notable for hepatic steatosis and prior cholecystectomy. Small upper abdominal lymph nodes, including a 14 mm short axis portacaval node (series 3/image 161), likely reactive. Musculoskeletal: Mild degenerative changes of the visualized thoracolumbar spine. No rib fracture is seen. IMPRESSION: No evidence of acute cardiopulmonary disease. No CT findings to account for the patient's left chest pain. Aortic Atherosclerosis (ICD10-I70.0). Electronically Signed   By: Julian Hy M.D.   On: 10/03/2019 10:16   ECHOCARDIOGRAM  COMPLETE  Result Date: 10/03/2019   ECHOCARDIOGRAM REPORT   Patient Name:   Gabriel [name] Date of Exam: 10/03/2019 Medical Rec #:  [id number]         Height:       69.0 in Accession #:    [id number]        Weight:       323.0 lb Date of Birth:  11-Oct-1965         BSA:          2.53 m Patient Age:    54 years          BP:           125/73 mmHg Patient Gender: M                 HR:           75 bpm. Exam Location:  Inpatient Procedure: 2D Echo and Intracardiac Opacification Agent Indications:    Dyspnea                 Chest pain  History:        Patient has no prior history of Echocardiogram examinations.                 CAD; Risk Factors:Hypertension, Dyslipidemia, Diabetes and Sleep                 Apnea.  Sonographer:    Clayton Lefort RDCS (AE) Referring Phys: [id number] Elouise Munroe  Sonographer Comments: Technically difficult study due to poor echo windows, suboptimal parasternal window, suboptimal apical window, suboptimal subcostal window and patient is morbidly obese. Image acquisition challenging due to patient body habitus and Image acquisition challenging due to respiratory motion. IMPRESSIONS  1. Left ventricular ejection fraction, by visual estimation, is 55 to 60%. The left ventricle has normal function. Unable to assess for left ventricular hypertrophy.  2. Definity contrast agent was given IV to delineate the left ventricular endocardial borders.  3. Left ventricular diastolic parameters are indeterminate.  4. The left ventricle has no regional wall motion abnormalities.  5. Global right ventricle has normal systolic function.The right ventricular size is mildly enlarged. No increase in right ventricular wall thickness.  6. Left atrial size was normal.  7. Right atrial size was normal.  8. The mitral valve is grossly normal. No evidence of mitral valve regurgitation.  9. The tricuspid valve is grossly normal. 10. The tricuspid valve is grossly normal. Tricuspid valve regurgitation is not  demonstrated. 11. The aortic valve is tricuspid. Aortic valve regurgitation is not visualized. No evidence of aortic valve sclerosis or stenosis. 12. The pulmonic valve was not well visualized. Pulmonic valve regurgitation is not visualized. 13. The inferior vena cava is dilated in size with >50% respiratory variability, suggesting right atrial pressure of 8 mmHg. 14. The aortic root was not well visualized. FINDINGS  Left Ventricle: Left ventricular  ejection fraction, by visual estimation, is 55 to 60%. The left ventricle has normal function. Definity contrast agent was given IV to delineate the left ventricular endocardial borders. The left ventricle has no regional wall motion abnormalities. There is no left ventricular hypertrophy. Left ventricular diastolic parameters are indeterminate. Normal left atrial pressure. Right Ventricle: The right ventricular size is mildly enlarged. No increase in right ventricular wall thickness. Global RV systolic function is has normal systolic function. Left Atrium: Left atrial size was normal in size. Right Atrium: Right atrial size was normal in size Pericardium: There is no evidence of pericardial effusion. Mitral Valve: The mitral valve is grossly normal. No evidence of mitral valve regurgitation. MV peak gradient, 4.0 mmHg. Tricuspid Valve: The tricuspid valve is grossly normal. Tricuspid valve regurgitation is not demonstrated. Aortic Valve: The aortic valve is tricuspid. Aortic valve regurgitation is not visualized. The aortic valve is structurally normal, with no evidence of sclerosis or stenosis. Aortic valve mean gradient measures 3.0 mmHg. Aortic valve peak gradient measures 5.1 mmHg. Aortic valve area, by VTI measures 2.64 cm. Pulmonic Valve: The pulmonic valve was not well visualized. Pulmonic valve regurgitation is not visualized. Pulmonic regurgitation is not visualized. Aorta: The aortic root was not well visualized. Venous: The inferior vena cava is dilated in  size with greater than 50% respiratory variability, suggesting right atrial pressure of 8 mmHg. IAS/Shunts: No atrial level shunt detected by color flow Doppler.  LEFT VENTRICLE PLAX 2D LVOT diam:     2.10 cm  Diastology LVOT Area:     3.46 cm LV e' lateral:   8.49 cm/s                         LV E/e' lateral: 8.1                         LV e' medial:    8.59 cm/s                         LV E/e' medial:  8.0  RIGHT VENTRICLE             IVC RV Basal diam:  3.50 cm     IVC diam: 2.30 cm RV Mid diam:    3.40 cm RV S prime:     13.20 cm/s TAPSE (M-mode): 3.0 cm LEFT ATRIUM             Index       RIGHT ATRIUM           Index LA Vol (A2C):   49.1 ml 19.39 ml/m RA Area:     17.60 cm LA Vol (A4C):   38.7 ml 15.28 ml/m RA Volume:   46.80 ml  18.48 ml/m LA Biplane Vol: 44.0 ml 17.37 ml/m  AORTIC VALVE AV Area (Vmax):    2.98 cm AV Area (Vmean):   2.62 cm AV Area (VTI):     2.64 cm AV Vmax:           113.00 cm/s AV Vmean:          83.500 cm/s AV VTI:            0.241 m AV Peak Grad:      5.1 mmHg AV Mean Grad:      3.0 mmHg LVOT Vmax:         97.30 cm/s LVOT Vmean:  63.100 cm/s LVOT VTI:          0.184 m LVOT/AV VTI ratio: 0.76 MITRAL VALVE MV Area (PHT): 3.03 cm             SHUNTS MV Peak grad:  4.0 mmHg             Systemic VTI:  0.18 m MV Mean grad:  2.0 mmHg             Systemic Diam: 2.10 cm MV Vmax:       1.00 m/s MV Vmean:      60.8 cm/s MV VTI:        0.34 m MV PHT:        72.50 msec MV Decel Time: 250 msec MV E velocity: 68.60 cm/s 103 cm/s MV A velocity: 71.60 cm/s 70.3 cm/s MV E/A ratio:  0.96       1.5  Kate Sable MD Electronically signed by Kate Sable MD Signature Date/Time: 10/03/2019/2:50:01 PM    Final     Cardiac Studies   TTE 10/03/19 IMPRESSIONS    1. Left ventricular ejection fraction, by visual estimation, is 55 to 60%. The left ventricle has normal function. Unable to assess for left ventricular hypertrophy.  2. Definity contrast agent was given IV to delineate  the left ventricular endocardial borders.  3. Left ventricular diastolic parameters are indeterminate.  4. The left ventricle has no regional wall motion abnormalities.  5. Global right ventricle has normal systolic function.The right ventricular size is mildly enlarged. No increase in right ventricular wall thickness.  6. Left atrial size was normal.  7. Right atrial size was normal.  8. The mitral valve is grossly normal. No evidence of mitral valve regurgitation.  9. The tricuspid valve is grossly normal. 10. The tricuspid valve is grossly normal. Tricuspid valve regurgitation is not demonstrated. 11. The aortic valve is tricuspid. Aortic valve regurgitation is not visualized. No evidence of aortic valve sclerosis or stenosis. 12. The pulmonic valve was not well visualized. Pulmonic valve regurgitation is not visualized. 13. The inferior vena cava is dilated in size with >50% respiratory variability, suggesting right atrial pressure of 8 mmHg. 14. The aortic root was not well visualized.  FINDINGS  Left Ventricle: Left ventricular ejection fraction, by visual estimation, is 55 to 60%. The left ventricle has normal function. Definity contrast agent was given IV to delineate the left ventricular endocardial borders. The left ventricle has no  regional wall motion abnormalities. There is no left ventricular hypertrophy. Left ventricular diastolic parameters are indeterminate. Normal left atrial pressure.  Right Ventricle: The right ventricular size is mildly enlarged. No increase in right ventricular wall thickness. Global RV systolic function is has normal systolic function.  Left Atrium: Left atrial size was normal in size.  Right Atrium: Right atrial size was normal in size  Pericardium: There is no evidence of pericardial effusion.  Mitral Valve: The mitral valve is grossly normal. No evidence of mitral valve regurgitation. MV peak gradient, 4.0 mmHg.  Tricuspid Valve: The  tricuspid valve is grossly normal. Tricuspid valve regurgitation is not demonstrated.  Aortic Valve: The aortic valve is tricuspid. Aortic valve regurgitation is not visualized. The aortic valve is structurally normal, with no evidence of sclerosis or stenosis. Aortic valve mean gradient measures 3.0 mmHg. Aortic valve peak gradient  measures 5.1 mmHg. Aortic valve area, by VTI measures 2.64 cm.  Pulmonic Valve: The pulmonic valve was not well visualized. Pulmonic valve regurgitation is not visualized. Pulmonic  regurgitation is not visualized.  Aorta: The aortic root was not well visualized.  Venous: The inferior vena cava is dilated in size with greater than 50% respiratory variability, suggesting right atrial pressure of 8 mmHg.  IAS/Shunts: No atrial level shunt detected by color flow Doppler.      Patient Profile     54 y.o. male with a hx of hypertension, hyperlipidemia, moderate coronary artery disease by cardiac cath in 2017, diabetes mellitus type 2, hypothyroidism, morbid obesity, remote tobacco use, and GERD now admitted with chest pain.    Assessment & Plan    Chest pain associated with DOE.  Last cath 2017 with moderate disease  Troponin 2-3, CT of chest without abnormality.  For cardiac cath.  The patient understands that risks included but are not limited to stroke (1 in 1000), death (1 in 53), kidney failure [usually temporary] (1 in 500), bleeding (1 in 200), allergic reaction [possibly serious] (1 in 200).   HLD on statin continue with LDL of 75  HTN on losartan   DM-2 Per IM  Sleep apnea no CPAP, needs ? Home sutdy   GERD  On Prilosec.     TSH 6.628        For questions or updates, please contact [place] Please consult [email or web address] for contact info under        Signed, Cecilie Kicks, NP  10/04/2019, 9:47 AM

## 2019-10-04 NOTE — Progress Notes (Signed)
Progress Note  Patient Name: Gabriel [name] Date of Encounter: 10/04/2019  Primary Cardiologist: Minus Breeding, MD   Subjective   Some mild pain.  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   Inpatient Medications    Scheduled Meds: . enoxaparin (LOVENOX) injection  40 mg Subcutaneous Q24H  . insulin aspart  0-15 Units Subcutaneous TID WC  . insulin aspart  0-5 Units Subcutaneous QHS  . levothyroxine  100 mcg Oral Q0600  . losartan  100 mg Oral Daily  . pantoprazole  40 mg Oral Daily  . simvastatin  40 mg Oral QHS   Continuous Infusions:  PRN Meds: acetaminophen, alum & mag hydroxide-simeth, nitroGLYCERIN, ondansetron (ZOFRAN) IV   Vital Signs    Vitals:   10/03/19 0945 10/03/19 1052 10/03/19 2227 10/04/19 0602  BP: (!) 93/56 125/73 122/80 106/64  Pulse: 73 75 67 68  Resp: 11 18 17 16   Temp:  (!) 97.5 F (36.4 C) 97.9 F (36.6 C) 98 F (36.7 C)  TempSrc:  Oral Oral Oral  SpO2: 93% 96% 96% 96%    Intake/Output Summary (Last 24 hours) at 10/04/2019 0947 Last data filed at 10/03/2019 1700 Gross per 24 hour  Intake 600 ml  Output --  Net 600 ml   Last 3 Weights 08/10/2019 06/25/2019 06/10/2019  Weight (lbs) 323 lb 323 lb 325 lb 6.4 oz  Weight (kg) 146.512 kg 146.512 kg 147.6 kg      Telemetry    SR - Personally Reviewed  ECG    No new - Personally Reviewed  Physical Exam   GEN: No acute distress.   Neck: No JVD Cardiac: RRR, no murmurs, rubs, or gallops.  Respiratory: Clear to auscultation bilaterally. GI: Soft, nontender, non-distended  MS: No edema; No deformity. Neuro:  Nonfocal  Psych: Normal affect   Labs    High Sensitivity Troponin:   Recent Labs  Lab 10/03/19 0426 10/03/19 0647  TROPONINIHS 2 3      Chemistry Recent Labs  Lab 10/03/19 0426  NA 135  K 3.7  CL 99  CO2 25  GLUCOSE 250*  BUN 14  CREATININE 1.07  CALCIUM 9.4  GFRNONAA >60  GFRAA >60  ANIONGAP 11     Hematology Recent Labs  Lab 10/03/19 0426  WBC 6.9   RBC 4.87  HGB 14.1  HCT 42.8  MCV 87.9  MCH 29.0  MCHC 32.9  RDW 12.8  PLT 246    BNP Recent Labs  Lab 10/03/19 0431  BNP 21.6     DDimer No results for input(s): DDIMER in the last 168 hours.   Radiology    DG Chest 2 View  Result Date: 10/03/2019 CLINICAL DATA:  Left chest pain, shortness of breath EXAM: CHEST - 2 VIEW COMPARISON:  06/25/2019 FINDINGS: Lungs are clear.  No pleural effusion or pneumothorax. The heart is normal in size. Visualized osseous structures are within normal limits. IMPRESSION: Normal chest radiographs. Electronically Signed   By: Julian Hy M.D.   On: 10/03/2019 04:54   CT CHEST WO CONTRAST  Result Date: 10/03/2019 CLINICAL DATA:  Shortness of breath, left chest pain EXAM: CT CHEST WITHOUT CONTRAST TECHNIQUE: Multidetector CT imaging of the chest was performed following the standard protocol without IV contrast. COMPARISON:  Chest radiographs dated 10/03/2019 FINDINGS: Cardiovascular: The heart is normal in size. No pericardial effusion. No evidence of thoracic aortic aneurysm. Mild atherosclerotic calcifications the aortic root/arch. Coronary atherosclerosis of the LAD. Mediastinum/Nodes: No suspicious mediastinal lymphadenopathy. Visualized  thyroid is unremarkable. Lungs/Pleura: No suspicious pulmonary nodules. No focal consolidation. No pleural effusion or pneumothorax. Upper Abdomen: Visualized upper abdomen is notable for hepatic steatosis and prior cholecystectomy. Small upper abdominal lymph nodes, including a 14 mm short axis portacaval node (series 3/image 161), likely reactive. Musculoskeletal: Mild degenerative changes of the visualized thoracolumbar spine. No rib fracture is seen. IMPRESSION: No evidence of acute cardiopulmonary disease. No CT findings to account for the patient's left chest pain. Aortic Atherosclerosis (ICD10-I70.0). Electronically Signed   By: Julian Hy M.D.   On: 10/03/2019 10:16   ECHOCARDIOGRAM  COMPLETE  Result Date: 10/03/2019   ECHOCARDIOGRAM REPORT   Patient Name:   Gabriel [name] Date of Exam: 10/03/2019 Medical Rec #:  [id number]         Height:       69.0 in Accession #:    [id number]        Weight:       323.0 lb Date of Birth:  12/30/1965         BSA:          2.53 m Patient Age:    54 years          BP:           125/73 mmHg Patient Gender: M                 HR:           75 bpm. Exam Location:  Inpatient Procedure: 2D Echo and Intracardiac Opacification Agent Indications:    Dyspnea                 Chest pain  History:        Patient has no prior history of Echocardiogram examinations.                 CAD; Risk Factors:Hypertension, Dyslipidemia, Diabetes and Sleep                 Apnea.  Sonographer:    Clayton Lefort RDCS (AE) Referring Phys: [id number] Elouise Munroe  Sonographer Comments: Technically difficult study due to poor echo windows, suboptimal parasternal window, suboptimal apical window, suboptimal subcostal window and patient is morbidly obese. Image acquisition challenging due to patient body habitus and Image acquisition challenging due to respiratory motion. IMPRESSIONS  1. Left ventricular ejection fraction, by visual estimation, is 55 to 60%. The left ventricle has normal function. Unable to assess for left ventricular hypertrophy.  2. Definity contrast agent was given IV to delineate the left ventricular endocardial borders.  3. Left ventricular diastolic parameters are indeterminate.  4. The left ventricle has no regional wall motion abnormalities.  5. Global right ventricle has normal systolic function.The right ventricular size is mildly enlarged. No increase in right ventricular wall thickness.  6. Left atrial size was normal.  7. Right atrial size was normal.  8. The mitral valve is grossly normal. No evidence of mitral valve regurgitation.  9. The tricuspid valve is grossly normal. 10. The tricuspid valve is grossly normal. Tricuspid valve regurgitation is not  demonstrated. 11. The aortic valve is tricuspid. Aortic valve regurgitation is not visualized. No evidence of aortic valve sclerosis or stenosis. 12. The pulmonic valve was not well visualized. Pulmonic valve regurgitation is not visualized. 13. The inferior vena cava is dilated in size with >50% respiratory variability, suggesting right atrial pressure of 8 mmHg. 14. The aortic root was not well visualized. FINDINGS  Left Ventricle: Left ventricular  ejection fraction, by visual estimation, is 55 to 60%. The left ventricle has normal function. Definity contrast agent was given IV to delineate the left ventricular endocardial borders. The left ventricle has no regional wall motion abnormalities. There is no left ventricular hypertrophy. Left ventricular diastolic parameters are indeterminate. Normal left atrial pressure. Right Ventricle: The right ventricular size is mildly enlarged. No increase in right ventricular wall thickness. Global RV systolic function is has normal systolic function. Left Atrium: Left atrial size was normal in size. Right Atrium: Right atrial size was normal in size Pericardium: There is no evidence of pericardial effusion. Mitral Valve: The mitral valve is grossly normal. No evidence of mitral valve regurgitation. MV peak gradient, 4.0 mmHg. Tricuspid Valve: The tricuspid valve is grossly normal. Tricuspid valve regurgitation is not demonstrated. Aortic Valve: The aortic valve is tricuspid. Aortic valve regurgitation is not visualized. The aortic valve is structurally normal, with no evidence of sclerosis or stenosis. Aortic valve mean gradient measures 3.0 mmHg. Aortic valve peak gradient measures 5.1 mmHg. Aortic valve area, by VTI measures 2.64 cm. Pulmonic Valve: The pulmonic valve was not well visualized. Pulmonic valve regurgitation is not visualized. Pulmonic regurgitation is not visualized. Aorta: The aortic root was not well visualized. Venous: The inferior vena cava is dilated in  size with greater than 50% respiratory variability, suggesting right atrial pressure of 8 mmHg. IAS/Shunts: No atrial level shunt detected by color flow Doppler.  LEFT VENTRICLE PLAX 2D LVOT diam:     2.10 cm  Diastology LVOT Area:     3.46 cm LV e' lateral:   8.49 cm/s                         LV E/e' lateral: 8.1                         LV e' medial:    8.59 cm/s                         LV E/e' medial:  8.0  RIGHT VENTRICLE             IVC RV Basal diam:  3.50 cm     IVC diam: 2.30 cm RV Mid diam:    3.40 cm RV S prime:     13.20 cm/s TAPSE (M-mode): 3.0 cm LEFT ATRIUM             Index       RIGHT ATRIUM           Index LA Vol (A2C):   49.1 ml 19.39 ml/m RA Area:     17.60 cm LA Vol (A4C):   38.7 ml 15.28 ml/m RA Volume:   46.80 ml  18.48 ml/m LA Biplane Vol: 44.0 ml 17.37 ml/m  AORTIC VALVE AV Area (Vmax):    2.98 cm AV Area (Vmean):   2.62 cm AV Area (VTI):     2.64 cm AV Vmax:           113.00 cm/s AV Vmean:          83.500 cm/s AV VTI:            0.241 m AV Peak Grad:      5.1 mmHg AV Mean Grad:      3.0 mmHg LVOT Vmax:         97.30 cm/s LVOT Vmean:  63.100 cm/s LVOT VTI:          0.184 m LVOT/AV VTI ratio: 0.76 MITRAL VALVE MV Area (PHT): 3.03 cm             SHUNTS MV Peak grad:  4.0 mmHg             Systemic VTI:  0.18 m MV Mean grad:  2.0 mmHg             Systemic Diam: 2.10 cm MV Vmax:       1.00 m/s MV Vmean:      60.8 cm/s MV VTI:        0.34 m MV PHT:        72.50 msec MV Decel Time: 250 msec MV E velocity: 68.60 cm/s 103 cm/s MV A velocity: 71.60 cm/s 70.3 cm/s MV E/A ratio:  0.96       1.5  Kate Sable MD Electronically signed by Kate Sable MD Signature Date/Time: 10/03/2019/2:50:01 PM    Final     Cardiac Studies   TTE 10/03/19 IMPRESSIONS    1. Left ventricular ejection fraction, by visual estimation, is 55 to 60%. The left ventricle has normal function. Unable to assess for left ventricular hypertrophy.  2. Definity contrast agent was given IV to delineate  the left ventricular endocardial borders.  3. Left ventricular diastolic parameters are indeterminate.  4. The left ventricle has no regional wall motion abnormalities.  5. Global right ventricle has normal systolic function.The right ventricular size is mildly enlarged. No increase in right ventricular wall thickness.  6. Left atrial size was normal.  7. Right atrial size was normal.  8. The mitral valve is grossly normal. No evidence of mitral valve regurgitation.  9. The tricuspid valve is grossly normal. 10. The tricuspid valve is grossly normal. Tricuspid valve regurgitation is not demonstrated. 11. The aortic valve is tricuspid. Aortic valve regurgitation is not visualized. No evidence of aortic valve sclerosis or stenosis. 12. The pulmonic valve was not well visualized. Pulmonic valve regurgitation is not visualized. 13. The inferior vena cava is dilated in size with >50% respiratory variability, suggesting right atrial pressure of 8 mmHg. 14. The aortic root was not well visualized.  FINDINGS  Left Ventricle: Left ventricular ejection fraction, by visual estimation, is 55 to 60%. The left ventricle has normal function. Definity contrast agent was given IV to delineate the left ventricular endocardial borders. The left ventricle has no  regional wall motion abnormalities. There is no left ventricular hypertrophy. Left ventricular diastolic parameters are indeterminate. Normal left atrial pressure.  Right Ventricle: The right ventricular size is mildly enlarged. No increase in right ventricular wall thickness. Global RV systolic function is has normal systolic function.  Left Atrium: Left atrial size was normal in size.  Right Atrium: Right atrial size was normal in size  Pericardium: There is no evidence of pericardial effusion.  Mitral Valve: The mitral valve is grossly normal. No evidence of mitral valve regurgitation. MV peak gradient, 4.0 mmHg.  Tricuspid Valve: The  tricuspid valve is grossly normal. Tricuspid valve regurgitation is not demonstrated.  Aortic Valve: The aortic valve is tricuspid. Aortic valve regurgitation is not visualized. The aortic valve is structurally normal, with no evidence of sclerosis or stenosis. Aortic valve mean gradient measures 3.0 mmHg. Aortic valve peak gradient  measures 5.1 mmHg. Aortic valve area, by VTI measures 2.64 cm.  Pulmonic Valve: The pulmonic valve was not well visualized. Pulmonic valve regurgitation is not visualized. Pulmonic  regurgitation is not visualized.  Aorta: The aortic root was not well visualized.  Venous: The inferior vena cava is dilated in size with greater than 50% respiratory variability, suggesting right atrial pressure of 8 mmHg.  IAS/Shunts: No atrial level shunt detected by color flow Doppler.      Patient Profile     55 y.o. male with a hx of hypertension, hyperlipidemia, moderate coronary artery disease by cardiac cath in 2017, diabetes mellitus type 2, hypothyroidism, morbid obesity, remote tobacco use, and GERD now admitted with chest pain.    Assessment & Plan    Chest pain associated with DOE.  Last cath 2017 with moderate disease  Troponin 2-3, CT of chest without abnormality.  For cardiac cath.  The patient understands that risks included but are not limited to stroke (1 in 1000), death (1 in 42), kidney failure [usually temporary] (1 in 500), bleeding (1 in 200), allergic reaction [possibly serious] (1 in 200).   HLD on statin continue with LDL of 75  HTN on losartan   DM-2 Per IM  Sleep apnea no CPAP, needs ? Home sutdy   GERD  On Prilosec.     TSH 6.628        For questions or updates, please contact [place] Please consult [email or web address] for contact info under        Signed, Cecilie Kicks, NP  10/04/2019, 9:47 AM

## 2019-10-04 NOTE — Interval H&P Note (Signed)
Cath Lab Visit (complete for each Cath Lab visit)  Clinical Evaluation Leading to the Procedure:   ACS: Yes.    Non-ACS:    Anginal Classification: CCS IV  Anti-ischemic medical therapy: Minimal Therapy (1 class of medications)  Non-Invasive Test Results: No non-invasive testing performed  Prior CABG: No previous CABG      History and Physical Interval Note:  10/04/2019 2:23 PM  [name]  has presented today for surgery, with the diagnosis of chest pain.  The various methods of treatment have been discussed with the patient and family. After consideration of risks, benefits and other options for treatment, the patient has consented to  Procedure(s): LEFT HEART CATH AND CORONARY ANGIOGRAPHY (N/A) as a surgical intervention.  The patient's history has been reviewed, patient examined, no change in status, stable for surgery.  I have reviewed the patient's chart and labs.  Questions were answered to the patient's satisfaction.     Larae Grooms

## 2019-10-05 ENCOUNTER — Telehealth: Payer: Self-pay | Admitting: *Deleted

## 2019-10-05 ENCOUNTER — Other Ambulatory Visit: Payer: Self-pay | Admitting: Medical

## 2019-10-05 DIAGNOSIS — I2510 Atherosclerotic heart disease of native coronary artery without angina pectoris: Secondary | ICD-10-CM

## 2019-10-05 DIAGNOSIS — I200 Unstable angina: Secondary | ICD-10-CM

## 2019-10-05 DIAGNOSIS — I2583 Coronary atherosclerosis due to lipid rich plaque: Secondary | ICD-10-CM

## 2019-10-05 DIAGNOSIS — E785 Hyperlipidemia, unspecified: Secondary | ICD-10-CM

## 2019-10-05 LAB — POCT ACTIVATED CLOTTING TIME
Activated Clotting Time: 263 seconds
Activated Clotting Time: 274 seconds
Activated Clotting Time: 279 seconds

## 2019-10-05 LAB — BASIC METABOLIC PANEL
Anion gap: 9 (ref 5–15)
BUN: 11 mg/dL (ref 6–20)
CO2: 25 mmol/L (ref 22–32)
Calcium: 8.6 mg/dL — ABNORMAL LOW (ref 8.9–10.3)
Chloride: 104 mmol/L (ref 98–111)
Creatinine, Ser: 1.12 mg/dL (ref 0.61–1.24)
GFR calc Af Amer: >60 mL/min (ref >60)
GFR calc non Af Amer: >60 mL/min (ref >60)
Glucose, Bld: 196 mg/dL — ABNORMAL HIGH (ref 70–99)
Potassium: 3.5 mmol/L (ref 3.5–5.1)
Sodium: 138 mmol/L (ref 135–145)

## 2019-10-05 LAB — CBC
HCT: 40.9 % (ref 39.0–52.0)
Hemoglobin: 13.7 g/dL (ref 13.0–17.0)
MCH: 28.8 pg (ref 26.0–34.0)
MCHC: 33.5 g/dL (ref 30.0–36.0)
MCV: 86.1 fL (ref 80.0–100.0)
Platelets: 203 10*3/uL (ref 150–400)
RBC: 4.75 MIL/uL (ref 4.22–5.81)
RDW: 12.7 % (ref 11.5–15.5)
WBC: 6.7 10*3/uL (ref 4.0–10.5)
nRBC: 0 % (ref 0.0–0.2)

## 2019-10-05 LAB — GLUCOSE, CAPILLARY: Glucose-Capillary: 149 mg/dL — ABNORMAL HIGH (ref 70–99)

## 2019-10-05 MED ORDER — ATORVASTATIN CALCIUM 80 MG PO TABS: 80.0000 mg | ORAL_TABLET | Freq: Every day | ORAL | 0 refills | Status: DC

## 2019-10-05 MED ORDER — LEVOTHYROXINE SODIUM 125 MCG PO TABS: 125.0000 ug | ORAL_TABLET | Freq: Every day | ORAL | 0 refills | Status: DC

## 2019-10-05 MED ORDER — TICAGRELOR 90 MG PO TABS: 90.0000 mg | ORAL_TABLET | Freq: Two times a day (BID) | ORAL | 0 refills | Status: DC

## 2019-10-05 MED FILL — ATORVASTATIN CALCIUM 80 MG: 80 | 30 days supply | Qty: 30 | Fill #0

## 2019-10-05 MED FILL — BRILINTA 90 MG TABLET: 90 | 30 days supply | Qty: 60 | Fill #0

## 2019-10-05 MED FILL — LEVOTHYROXINE 125 MCG TAB: 125 | 30 days supply | Qty: 30 | Fill #0

## 2019-10-05 MED FILL — Nitroglycerin IV Soln 100 MCG/ML in D5W: INTRA_ARTERIAL | Qty: 10 | Status: AC

## 2019-10-05 NOTE — Discharge Summary (Addendum)
Physician Discharge Summary  RYANMICHAEL Brundage [id number] DOB: 01/19/1966 DOA: 10/03/2019  PCP: Marin Olp, MD  Admit date: 10/03/2019 Discharge date: 10/05/2019  Admitted From: Home Disposition: Home  Recommendations for Outpatient Follow-up:  1. Follow up with PCP in 1-2 weeks 2. Follow with cardiology as scheduled per their recommendations 3. Please obtain BMP/CBC in one week 4. Please follow up on the following pending results:  Home Health: None Equipment/Devices: None  Discharge Condition: Stable CODE STATUS: Full code Diet recommendation: Cardiac  Subjective: Seen and examined.  No complaints.  No more chest pain or shortness of breath.  Brief/Interim Summary: Tudor Legner Allshouseis a 54 y.o.malewith medical history significant ofhypertension, hyperlipidemia, nonobstructive coronary artery disease by cardiac cath in 2017, diabetes mellitus type 2, hypothyroidism, morbid obesity, remote tobacco use, and GERD. He presented with complaints of intermittent chest pain over the last 3 days and several days of dyspnea on exertion.  He was hemodynamically stable upon presentation to the ED.  EKG did not show any ischemic changes and his cardiac enzymes were also within normal meds.  He was admitted to hospital service for possible angina equivalent and cardiology was consulted.  He was ruled out of PE/DVT based on negative D-dimer.  Subsequently he underwent cardiac catheterization and was found to have single-vessel CAD and drug-eluting stent was placed and he was started on aspirin and Plavix.  He is doing much better now and he has been cleared by cardiology to to be discharged.  His TSH was elevated so his Synthroid was increased from 197mcg to 125 mcg daily and he was also started on atorvastatin 80 mg p.o. daily.  He does carry a history of sleep apnea.  He had sleep study long time ago but never followed through to wear order to get CPAP machine.  Cardiology is going to  arrange outpatient sleep study for him.  He will follow-up with them as well as PCP.  His Zocor has been switched to atorvastatin 40 mg p.o. daily.  His blood sugar remained slightly elevated with recent hemoglobin A1c of 8.1.  He might benefit from addition of further oral antidiabetic medication such as Januvia.  I will defer that to his PCP.  Discharge Diagnoses:  Principal Problem:   Chest pain Active Problems:   Essential hypertension   Hypothyroidism   Hyperlipidemia associated with type 2 diabetes mellitus (HCC)   Morbid obesity (HCC)   GERD (gastroesophageal reflux disease)   Dyspnea   Back pain   Chest pain at rest   Coronary artery disease due to lipid rich plaque   Unstable angina [facility])    Discharge Instructions  Discharge Instructions    Amb Referral to Cardiac Rehabilitation   Complete by: As directed    Diagnosis: Coronary Stents   After initial evaluation and assessments completed: Virtual Based Care may be provided alone or in conjunction with Phase 2 Cardiac Rehab based on patient barriers.: Yes   Discharge patient   Complete by: As directed    Discharge disposition: 01-Home or Self Care   Discharge patient date: 10/05/2019     Allergies as of 10/05/2019   No Known Allergies     Medication List    STOP taking these medications   simvastatin 40 MG tablet Commonly known as: ZOCOR     TAKE these medications   acetaminophen 500 MG tablet Commonly known as: TYLENOL Take 1,000 mg by mouth every 6 (six) hours as needed for mild pain or moderate pain. Reported  on 01/09/2016   aspirin 81 MG EC tablet Take 1 tablet (81 mg total) by mouth daily.   atorvastatin 80 MG tablet Commonly known as: LIPITOR Take 1 tablet (80 mg total) by mouth daily at 6 PM.   cyclobenzaprine 10 MG tablet Commonly known as: FLEXERIL Take 1 tablet (10 mg total) by mouth 3 (three) times daily as needed for muscle spasms.   Fish Oil 1000 MG Caps Take 1,000 mg by mouth daily.    glimepiride 4 MG tablet Commonly known as: AMARYL TAKE 2 TABLET BY MOUTH ONCE DAILY BEFORE  BREAKFAST What changed:   how much to take  how to take this  when to take this  additional instructions   glucose blood test strip Commonly known as: ONE TOUCH TEST STRIPS Up to 4 times daily.  Onetouch Verio.  E11.65   levothyroxine 125 MCG tablet Commonly known as: SYNTHROID Take 1 tablet (125 mcg total) by mouth daily before breakfast. Start taking on: October 06, 2019 What changed:   medication strength  how much to take  when to take this   losartan 100 MG tablet Commonly known as: COZAAR Take 1 tablet by mouth once daily   metFORMIN 1000 MG tablet Commonly known as: GLUCOPHAGE Take 1 tablet (1,000 mg total) by mouth 2 (two) times daily with a meal.   multivitamin,tx-minerals tablet Take 1 tablet by mouth daily.   nitroGLYCERIN 0.4 MG SL tablet Commonly known as: NITROSTAT Place 1 tablet (0.4 mg total) under the tongue every 5 (five) minutes as needed for chest pain (CP or SOB).   omeprazole 20 MG capsule Commonly known as: PRILOSEC Take 1 capsule (20 mg total) by mouth daily.   onetouch ultrasoft lancets Up to 4 times daily.  Onetouch Verio.  DxE11.65   ticagrelor 90 MG Tabs tablet Commonly known as: BRILINTA Take 1 tablet (90 mg total) by mouth 2 (two) times daily.      Follow-up Information    Marin Olp, MD Follow up in 1 week(s).   Specialty: Family Medicine Contact information: [place] [address] [phone]        Lendon Colonel, NP Follow up on 10/20/2019.   Specialties: Nurse Practitioner, Radiology, Cardiology Why: Please go to hospital follow-up February 10th at 11:15 AM Contact information: [address] [place] [address] [phone]        [facility] Follow up on 11/16/2019.   Specialty: Cardiology Why: Please go get fasting labs March 9th 7:30 AM to 4  PM Contact information: [address] [place] [phone]         No Known Allergies  Consultations: Cardiology   Procedures/Studies: DG Chest 2 View  Result Date: 10/03/2019 CLINICAL DATA:  Left chest pain, shortness of breath EXAM: CHEST - 2 VIEW COMPARISON:  06/25/2019 FINDINGS: Lungs are clear.  No pleural effusion or pneumothorax. The heart is normal in size. Visualized osseous structures are within normal limits. IMPRESSION: Normal chest radiographs. Electronically Signed   By: Julian Hy M.D.   On: 10/03/2019 04:54   CT CHEST WO CONTRAST  Result Date: 10/03/2019 CLINICAL DATA:  Shortness of breath, left chest pain EXAM: CT CHEST WITHOUT CONTRAST TECHNIQUE: Multidetector CT imaging of the chest was performed following the standard protocol without IV contrast. COMPARISON:  Chest radiographs dated 10/03/2019 FINDINGS: Cardiovascular: The heart is normal in size. No pericardial effusion. No evidence of thoracic aortic aneurysm. Mild atherosclerotic calcifications  the aortic root/arch. Coronary atherosclerosis of the LAD. Mediastinum/Nodes: No suspicious mediastinal lymphadenopathy. Visualized thyroid is unremarkable. Lungs/Pleura: No suspicious pulmonary nodules. No focal consolidation. No pleural effusion or pneumothorax. Upper Abdomen: Visualized upper abdomen is notable for hepatic steatosis and prior cholecystectomy. Small upper abdominal lymph nodes, including a 14 mm short axis portacaval node (series 3/image 161), likely reactive. Musculoskeletal: Mild degenerative changes of the visualized thoracolumbar spine. No rib fracture is seen. IMPRESSION: No evidence of acute cardiopulmonary disease. No CT findings to account for the patient's left chest pain. Aortic Atherosclerosis (ICD10-I70.0). Electronically Signed   By: Julian Hy M.D.   On: 10/03/2019 10:16   CARDIAC CATHETERIZATION  Result Date: 10/04/2019  Mid LAD lesion is  60% stenosed. FFR was 0.78.  A drug-eluting stent was successfully placed using a SYNERGY XD 3.0X20.  Post intervention, there is a 0% residual stenosis.  The left ventricular systolic function is normal.  LV end diastolic pressure is normal. LVEDP 14 mm Hg.  The left ventricular ejection fraction is 55-65% by visual estimate.  There is no aortic valve stenosis.  Single vessel CAD.  Continue aspirin and Brilinta for 12 months.  If Brilinta is too expensive, could switch to clopidogrel after 1 month. He needs aggressive risk factor modification including weight loss and DM control.   ECHOCARDIOGRAM COMPLETE  Result Date: 10/03/2019   ECHOCARDIOGRAM REPORT   Patient Name:   MICO KNEIFL Date of Exam: 10/03/2019 Medical Rec #:  [id number]         Height:       69.0 in Accession #:    [id number]        Weight:       323.0 lb Date of Birth:  06/17/1966         BSA:          2.53 m Patient Age:    24 years          BP:           125/73 mmHg Patient Gender: M                 HR:           75 bpm. Exam Location:  Inpatient Procedure: 2D Echo and Intracardiac Opacification Agent Indications:    Dyspnea                 Chest pain  History:        Patient has no prior history of Echocardiogram examinations.                 CAD; Risk Factors:Hypertension, Dyslipidemia, Diabetes and Sleep                 Apnea.  Sonographer:    Clayton Lefort RDCS (AE) Referring Phys: [id number] Elouise Munroe  Sonographer Comments: Technically difficult study due to poor echo windows, suboptimal parasternal window, suboptimal apical window, suboptimal subcostal window and patient is morbidly obese. Image acquisition challenging due to patient body habitus and Image acquisition challenging due to respiratory motion. IMPRESSIONS  1. Left ventricular ejection fraction, by visual estimation, is 55 to 60%. The left ventricle has normal function. Unable to assess for left ventricular hypertrophy.  2. Definity contrast agent was given IV to  delineate the left ventricular endocardial borders.  3. Left ventricular diastolic parameters are indeterminate.  4. The left ventricle has no regional wall motion abnormalities.  5. Global right ventricle has normal systolic function.The right ventricular size  is mildly enlarged. No increase in right ventricular wall thickness.  6. Left atrial size was normal.  7. Right atrial size was normal.  8. The mitral valve is grossly normal. No evidence of mitral valve regurgitation.  9. The tricuspid valve is grossly normal. 10. The tricuspid valve is grossly normal. Tricuspid valve regurgitation is not demonstrated. 11. The aortic valve is tricuspid. Aortic valve regurgitation is not visualized. No evidence of aortic valve sclerosis or stenosis. 12. The pulmonic valve was not well visualized. Pulmonic valve regurgitation is not visualized. 13. The inferior vena cava is dilated in size with >50% respiratory variability, suggesting right atrial pressure of 8 mmHg. 14. The aortic root was not well visualized. FINDINGS  Left Ventricle: Left ventricular ejection fraction, by visual estimation, is 55 to 60%. The left ventricle has normal function. Definity contrast agent was given IV to delineate the left ventricular endocardial borders. The left ventricle has no regional wall motion abnormalities. There is no left ventricular hypertrophy. Left ventricular diastolic parameters are indeterminate. Normal left atrial pressure. Right Ventricle: The right ventricular size is mildly enlarged. No increase in right ventricular wall thickness. Global RV systolic function is has normal systolic function. Left Atrium: Left atrial size was normal in size. Right Atrium: Right atrial size was normal in size Pericardium: There is no evidence of pericardial effusion. Mitral Valve: The mitral valve is grossly normal. No evidence of mitral valve regurgitation. MV peak gradient, 4.0 mmHg. Tricuspid Valve: The tricuspid valve is grossly normal.  Tricuspid valve regurgitation is not demonstrated. Aortic Valve: The aortic valve is tricuspid. Aortic valve regurgitation is not visualized. The aortic valve is structurally normal, with no evidence of sclerosis or stenosis. Aortic valve mean gradient measures 3.0 mmHg. Aortic valve peak gradient measures 5.1 mmHg. Aortic valve area, by VTI measures 2.64 cm. Pulmonic Valve: The pulmonic valve was not well visualized. Pulmonic valve regurgitation is not visualized. Pulmonic regurgitation is not visualized. Aorta: The aortic root was not well visualized. Venous: The inferior vena cava is dilated in size with greater than 50% respiratory variability, suggesting right atrial pressure of 8 mmHg. IAS/Shunts: No atrial level shunt detected by color flow Doppler.  LEFT VENTRICLE PLAX 2D LVOT diam:     2.10 cm  Diastology LVOT Area:     3.46 cm LV e' lateral:   8.49 cm/s                         LV E/e' lateral: 8.1                         LV e' medial:    8.59 cm/s                         LV E/e' medial:  8.0  RIGHT VENTRICLE             IVC RV Basal diam:  3.50 cm     IVC diam: 2.30 cm RV Mid diam:    3.40 cm RV S prime:     13.20 cm/s TAPSE (M-mode): 3.0 cm LEFT ATRIUM             Index       RIGHT ATRIUM           Index LA Vol (A2C):   49.1 ml 19.39 ml/m RA Area:     17.60 cm LA Vol (A4C):   38.7  ml 15.28 ml/m RA Volume:   46.80 ml  18.48 ml/m LA Biplane Vol: 44.0 ml 17.37 ml/m  AORTIC VALVE AV Area (Vmax):    2.98 cm AV Area (Vmean):   2.62 cm AV Area (VTI):     2.64 cm AV Vmax:           113.00 cm/s AV Vmean:          83.500 cm/s AV VTI:            0.241 m AV Peak Grad:      5.1 mmHg AV Mean Grad:      3.0 mmHg LVOT Vmax:         97.30 cm/s LVOT Vmean:        63.100 cm/s LVOT VTI:          0.184 m LVOT/AV VTI ratio: 0.76 MITRAL VALVE MV Area (PHT): 3.03 cm             SHUNTS MV Peak grad:  4.0 mmHg             Systemic VTI:  0.18 m MV Mean grad:  2.0 mmHg             Systemic Diam: 2.10 cm MV Vmax:        1.00 m/s MV Vmean:      60.8 cm/s MV VTI:        0.34 m MV PHT:        72.50 msec MV Decel Time: 250 msec MV E velocity: 68.60 cm/s 103 cm/s MV A velocity: 71.60 cm/s 70.3 cm/s MV E/A ratio:  0.96       1.5  Kate Sable MD Electronically signed by Kate Sable MD Signature Date/Time: 10/03/2019/2:50:01 PM    Final      Discharge Exam: Vitals:   10/05/19 0600 10/05/19 0727  BP: 134/63 125/88  Pulse: 81 73  Resp: 18 16  Temp: 97.9 F (36.6 C)   SpO2: 97% 94%   Vitals:   10/05/19 0044 10/05/19 0500 10/05/19 0600 10/05/19 0727  BP: 138/78  134/63 125/88  Pulse: 80  81 73  Resp:   18 16  Temp:   97.9 F (36.6 C)   TempSrc:      SpO2: 93%  97% 94%  Weight:  (!) 143.6 kg    Height:        General: Pt is alert, awake, not in acute distress Cardiovascular: RRR, S1/S2 +, no rubs, no gallops Respiratory: CTA bilaterally, no wheezing, no rhonchi Abdominal: Soft, NT, ND, bowel sounds + Extremities: Trace edema bilateral lower extremity, no cyanosis    The results of significant diagnostics from this hospitalization (including imaging, microbiology, ancillary and laboratory) are listed below for reference.     Microbiology: Recent Results (from the past 240 hour(s))  Respiratory Panel by RT PCR (Flu A&B, Covid) - Nasopharyngeal Swab     Status: None   Collection Time: 10/03/19  6:59 AM   Specimen: Nasopharyngeal Swab  Result Value Ref Range Status   SARS Coronavirus 2 by RT PCR NEGATIVE NEGATIVE Final    Comment: (NOTE) SARS-CoV-2 target nucleic acids are NOT DETECTED. The SARS-CoV-2 RNA is generally detectable in upper respiratoy specimens during the acute phase of infection. The lowest concentration of SARS-CoV-2 viral copies this assay can detect is 131 copies/mL. A negative result does not preclude SARS-Cov-2 infection and should not be used as the sole basis for treatment or other patient management decisions. A negative result  may occur with  improper specimen  collection/handling, submission of specimen other than nasopharyngeal swab, presence of viral mutation(s) within the areas targeted by this assay, and inadequate number of viral copies (<131 copies/mL). A negative result must be combined with clinical observations, patient history, and epidemiological information. The expected result is Negative. Fact Sheet for Patients:  PinkCheek.be Fact Sheet for Healthcare Providers:  GravelBags.it This test is not yet ap proved or cleared by the [place] FDA and  has been authorized for detection and/or diagnosis of SARS-CoV-2 by FDA under an Emergency Use Authorization (EUA). This EUA will remain  in effect (meaning this test can be used) for the duration of the COVID-19 declaration under Section 564(b)(1) of the Act, 21 U.S.C. section 360bbb-3(b)(1), unless the authorization is terminated or revoked sooner.    Influenza A by PCR NEGATIVE NEGATIVE Final   Influenza B by PCR NEGATIVE NEGATIVE Final    Comment: (NOTE) The Xpert Xpress SARS-CoV-2/FLU/RSV assay is intended as an aid in  the diagnosis of influenza from Nasopharyngeal swab specimens and  should not be used as a sole basis for treatment. Nasal washings and  aspirates are unacceptable for Xpert Xpress SARS-CoV-2/FLU/RSV  testing. Fact Sheet for Patients: PinkCheek.be Fact Sheet for Healthcare Providers: GravelBags.it This test is not yet approved or cleared by the [place] FDA and  has been authorized for detection and/or diagnosis of SARS-CoV-2 by  FDA under an Emergency Use Authorization (EUA). This EUA will remain  in effect (meaning this test can be used) for the duration of the  Covid-19 declaration under Section 564(b)(1) of the Act, 21  U.S.C. section 360bbb-3(b)(1), unless the authorization is  terminated or revoked. Performed at [facility], [place] [address], [place], [place] [address]   Surgical pcr screen     Status: None   Collection Time: 10/04/19  1:07 AM   Specimen: Nasal Mucosa; Nasal Swab  Result Value Ref Range Status   MRSA, PCR NEGATIVE NEGATIVE Final   Staphylococcus aureus NEGATIVE NEGATIVE Final    Comment: (NOTE) The Xpert SA Assay (FDA approved for NASAL specimens in patients 47 years of age and older), is one component of a comprehensive surveillance program. It is not intended to diagnose infection nor to guide or monitor treatment. Performed at [facility], [place] [address], [place], [place] [address]      Labs: BNP (last 3 results) Recent Labs    10/03/19 0431  BNP 0000000   Basic Metabolic Panel: Recent Labs  Lab 10/03/19 0426 10/05/19 0419  NA 135 138  K 3.7 3.5  CL 99 104  CO2 25 25  GLUCOSE 250* 196*  BUN 14 11  CREATININE 1.07 1.12  CALCIUM 9.4 8.6*   Liver Function Tests: No results for input(s): AST, ALT, ALKPHOS, BILITOT, PROT, ALBUMIN in the last 168 hours. No results for input(s): LIPASE, AMYLASE in the last 168 hours. No results for input(s): AMMONIA in the last 168 hours. CBC: Recent Labs  Lab 10/03/19 0426 10/05/19 0419  WBC 6.9 6.7  HGB 14.1 13.7  HCT 42.8 40.9  MCV 87.9 86.1  PLT 246 203   Cardiac Enzymes: No results for input(s): CKTOTAL, CKMB, CKMBINDEX, TROPONINI in the last 168 hours. BNP: Invalid input(s): POCBNP CBG: Recent Labs  Lab 10/04/19 1139 10/04/19 1612 10/04/19 1702 10/04/19 2117 10/05/19 0731  GLUCAP 173* 177* 170* 162* 149*   D-Dimer Recent Labs    10/04/19 1200  DDIMER <0.27   Hgb  A1c Recent Labs    10/03/19 1205  HGBA1C 8.1*   Lipid Profile Recent Labs    10/03/19 0844  CHOL 139  HDL 32*  LDLCALC 75  TRIG 161*  CHOLHDL 4.3   Thyroid function studies Recent Labs    10/03/19 0844  TSH 6.628*   Anemia work up No results for input(s): VITAMINB12, FOLATE, FERRITIN, TIBC, IRON, RETICCTPCT in the last 72  hours. Urinalysis    Component Value Date/Time   COLORURINE YELLOW 09/16/2018 1135   APPEARANCEUR CLEAR 09/16/2018 1135   LABSPEC >=1.030 (A) 09/16/2018 1135   PHURINE 5.0 09/16/2018 1135   GLUCOSEU NEGATIVE 09/16/2018 1135   HGBUR NEGATIVE 09/16/2018 1135   BILIRUBINUR Negative 06/25/2019 1531   KETONESUR NEGATIVE 09/16/2018 1135   PROTEINUR Negative 06/25/2019 1531   PROTEINUR NEGATIVE 01/19/2018 2034   UROBILINOGEN 0.2 06/25/2019 1531   UROBILINOGEN 0.2 09/16/2018 1135   NITRITE Negative 06/25/2019 1531   NITRITE NEGATIVE 09/16/2018 1135   LEUKOCYTESUR Negative 06/25/2019 1531   Sepsis Labs Invalid input(s): PROCALCITONIN,  WBC,  LACTICIDVEN Microbiology Recent Results (from the past 240 hour(s))  Respiratory Panel by RT PCR (Flu A&B, Covid) - Nasopharyngeal Swab     Status: None   Collection Time: 10/03/19  6:59 AM   Specimen: Nasopharyngeal Swab  Result Value Ref Range Status   SARS Coronavirus 2 by RT PCR NEGATIVE NEGATIVE Final    Comment: (NOTE) SARS-CoV-2 target nucleic acids are NOT DETECTED. The SARS-CoV-2 RNA is generally detectable in upper respiratoy specimens during the acute phase of infection. The lowest concentration of SARS-CoV-2 viral copies this assay can detect is 131 copies/mL. A negative result does not preclude SARS-Cov-2 infection and should not be used as the sole basis for treatment or other patient management decisions. A negative result may occur with  improper specimen collection/handling, submission of specimen other than nasopharyngeal swab, presence of viral mutation(s) within the areas targeted by this assay, and inadequate number of viral copies (<131 copies/mL). A negative result must be combined with clinical observations, patient history, and epidemiological information. The expected result is Negative. Fact Sheet for Patients:  PinkCheek.be Fact Sheet for Healthcare Providers:   GravelBags.it This test is not yet ap proved or cleared by the [place] FDA and  has been authorized for detection and/or diagnosis of SARS-CoV-2 by FDA under an Emergency Use Authorization (EUA). This EUA will remain  in effect (meaning this test can be used) for the duration of the COVID-19 declaration under Section 564(b)(1) of the Act, 21 U.S.C. section 360bbb-3(b)(1), unless the authorization is terminated or revoked sooner.    Influenza A by PCR NEGATIVE NEGATIVE Final   Influenza B by PCR NEGATIVE NEGATIVE Final    Comment: (NOTE) The Xpert Xpress SARS-CoV-2/FLU/RSV assay is intended as an aid in  the diagnosis of influenza from Nasopharyngeal swab specimens and  should not be used as a sole basis for treatment. Nasal washings and  aspirates are unacceptable for Xpert Xpress SARS-CoV-2/FLU/RSV  testing. Fact Sheet for Patients: PinkCheek.be Fact Sheet for Healthcare Providers: GravelBags.it This test is not yet approved or cleared by the [place] FDA and  has been authorized for detection and/or diagnosis of SARS-CoV-2 by  FDA under an Emergency Use Authorization (EUA). This EUA will remain  in effect (meaning this test can be used) for the duration of the  Covid-19 declaration under Section 564(b)(1) of the Act, 21  U.S.C. section 360bbb-3(b)(1), unless the authorization is  terminated or revoked. Performed at [facility]  [facility], [place] [address] [place], [place] [address]   Surgical pcr screen     Status: None   Collection Time: 10/04/19  1:07 AM   Specimen: Nasal Mucosa; Nasal Swab  Result Value Ref Range Status   MRSA, PCR NEGATIVE NEGATIVE Final   Staphylococcus aureus NEGATIVE NEGATIVE Final    Comment: (NOTE) The Xpert SA Assay (FDA approved for NASAL specimens in patients 81 years of age and older), is one component of a comprehensive surveillance program. It is not  intended to diagnose infection nor to guide or monitor treatment. Performed at [facility], [place] [address], [place], [place] [address]      Time coordinating discharge: Over 30 minutes  SIGNED:   Darliss Cheney, MD  [facility] 10/05/2019, 10:59 AM  If 7PM-7AM, please contact night-coverage [email or web address] Password [email or web address]

## 2019-10-05 NOTE — Telephone Encounter (Signed)
-----   Message from [place], PA-C sent at 10/05/2019  1:09 PM EST ----- Regarding: Home Sleep Study Good afternoon, I rounded on this patient with Dr. Radford Pax in the hospital. He has been discharged today and needs a home sleep study. She told me to let you know and you would be able to get that taken care of. Please let me know if there is anything I need to do.   Thanks! Cadence

## 2019-10-05 NOTE — Progress Notes (Signed)
Progress Note  Patient Name: Gabriel [name] Date of Encounter: 10/05/2019  Primary Cardiologist: Minus Breeding, MD   Subjective   Patient is doing well this morning. No CP. He did have some trouble breathing overnight. Cath site is stable.   Inpatient Medications    Scheduled Meds: . aspirin  81 mg Oral Daily  . atorvastatin  80 mg Oral q1800  . enoxaparin (LOVENOX) injection  40 mg Subcutaneous Q24H  . insulin aspart  0-15 Units Subcutaneous TID WC  . insulin aspart  0-5 Units Subcutaneous QHS  . levothyroxine  125 mcg Oral QAC breakfast  . losartan  100 mg Oral Daily  . pantoprazole  40 mg Oral Daily  . sodium chloride flush  3 mL Intravenous Q12H  . ticagrelor  90 mg Oral BID   Continuous Infusions: . sodium chloride     PRN Meds: sodium chloride, acetaminophen, acetaminophen, alum & mag hydroxide-simeth, nitroGLYCERIN, ondansetron (ZOFRAN) IV, ondansetron (ZOFRAN) IV, sodium chloride flush   Vital Signs    Vitals:   10/04/19 2330 10/05/19 0044 10/05/19 0500 10/05/19 0600  BP: 119/64 138/78  134/63  Pulse: 77 80  81  Resp:    18  Temp:    97.9 F (36.6 C)  TempSrc:      SpO2: 97% 93%  97%  Weight:   (!) 143.6 kg   Height:        Intake/Output Summary (Last 24 hours) at 10/05/2019 0636 Last data filed at 10/04/2019 2030 Gross per 24 hour  Intake 203 ml  Output --  Net 203 ml   Last 3 Weights 10/05/2019 10/04/2019 08/10/2019  Weight (lbs) 316 lb 9.6 oz 315 lb 1.6 oz 323 lb  Weight (kg) 143.609 kg 142.928 kg 146.512 kg      Telemetry    NSR, HR 70-80s; no other arrhythmias noted - Personally Reviewed  ECG    No new - Personally Reviewed  Physical Exam   GEN: No acute distress.   Neck: No JVD Cardiac: RRR, no murmurs, rubs, or gallops.  Respiratory: Minimal wheezing GI: Soft, nontender, non-distended  MS: Trace edema; No deformity; right radial site with no hematoma or bruit Neuro:  Nonfocal  Psych: Normal affect   Labs    High  Sensitivity Troponin:   Recent Labs  Lab 10/03/19 0426 10/03/19 0647  TROPONINIHS 2 3      Chemistry Recent Labs  Lab 10/03/19 0426 10/05/19 0419  NA 135 138  K 3.7 3.5  CL 99 104  CO2 25 25  GLUCOSE 250* 196*  BUN 14 11  CREATININE 1.07 1.12  CALCIUM 9.4 8.6*  GFRNONAA >60 >60  GFRAA >60 >60  ANIONGAP 11 9     Hematology Recent Labs  Lab 10/03/19 0426 10/05/19 0419  WBC 6.9 6.7  RBC 4.87 4.75  HGB 14.1 13.7  HCT 42.8 40.9  MCV 87.9 86.1  MCH 29.0 28.8  MCHC 32.9 33.5  RDW 12.8 12.7  PLT 246 203    BNP Recent Labs  Lab 10/03/19 0431  BNP 21.6     DDimer  Recent Labs  Lab 10/04/19 1200  DDIMER <0.27     Radiology    CT CHEST WO CONTRAST  Result Date: 10/03/2019 CLINICAL DATA:  Shortness of breath, left chest pain EXAM: CT CHEST WITHOUT CONTRAST TECHNIQUE: Multidetector CT imaging of the chest was performed following the standard protocol without IV contrast. COMPARISON:  Chest radiographs dated 10/03/2019 FINDINGS: Cardiovascular: The heart is normal in  size. No pericardial effusion. No evidence of thoracic aortic aneurysm. Mild atherosclerotic calcifications the aortic root/arch. Coronary atherosclerosis of the LAD. Mediastinum/Nodes: No suspicious mediastinal lymphadenopathy. Visualized thyroid is unremarkable. Lungs/Pleura: No suspicious pulmonary nodules. No focal consolidation. No pleural effusion or pneumothorax. Upper Abdomen: Visualized upper abdomen is notable for hepatic steatosis and prior cholecystectomy. Small upper abdominal lymph nodes, including a 14 mm short axis portacaval node (series 3/image 161), likely reactive. Musculoskeletal: Mild degenerative changes of the visualized thoracolumbar spine. No rib fracture is seen. IMPRESSION: No evidence of acute cardiopulmonary disease. No CT findings to account for the patient's left chest pain. Aortic Atherosclerosis (ICD10-I70.0). Electronically Signed   By: Julian Hy M.D.   On:  10/03/2019 10:16   CARDIAC CATHETERIZATION  Result Date: 10/04/2019  Mid LAD lesion is 60% stenosed. FFR was 0.78.  A drug-eluting stent was successfully placed using a SYNERGY XD 3.0X20.  Post intervention, there is a 0% residual stenosis.  The left ventricular systolic function is normal.  LV end diastolic pressure is normal. LVEDP 14 mm Hg.  The left ventricular ejection fraction is 55-65% by visual estimate.  There is no aortic valve stenosis.  Single vessel CAD.  Continue aspirin and Brilinta for 12 months.  If Brilinta is too expensive, could switch to clopidogrel after 1 month. He needs aggressive risk factor modification including weight loss and DM control.   ECHOCARDIOGRAM COMPLETE  Result Date: 10/03/2019   ECHOCARDIOGRAM REPORT   Patient Name:   Gabriel [name] Date of Exam: 10/03/2019 Medical Rec #:  [id number]         Height:       69.0 in Accession #:    [id number]        Weight:       323.0 lb Date of Birth:  Mar 30, 1966         BSA:          2.53 m Patient Age:    54 years          BP:           125/73 mmHg Patient Gender: M                 HR:           75 bpm. Exam Location:  Inpatient Procedure: 2D Echo and Intracardiac Opacification Agent Indications:    Dyspnea                 Chest pain  History:        Patient has no prior history of Echocardiogram examinations.                 CAD; Risk Factors:Hypertension, Dyslipidemia, Diabetes and Sleep                 Apnea.  Sonographer:    Clayton Lefort RDCS (AE) Referring Phys: [id number] Elouise Munroe  Sonographer Comments: Technically difficult study due to poor echo windows, suboptimal parasternal window, suboptimal apical window, suboptimal subcostal window and patient is morbidly obese. Image acquisition challenging due to patient body habitus and Image acquisition challenging due to respiratory motion. IMPRESSIONS  1. Left ventricular ejection fraction, by visual estimation, is 55 to 60%. The left ventricle has normal function.  Unable to assess for left ventricular hypertrophy.  2. Definity contrast agent was given IV to delineate the left ventricular endocardial borders.  3. Left ventricular diastolic parameters are indeterminate.  4. The left ventricle has no regional wall motion  abnormalities.  5. Global right ventricle has normal systolic function.The right ventricular size is mildly enlarged. No increase in right ventricular wall thickness.  6. Left atrial size was normal.  7. Right atrial size was normal.  8. The mitral valve is grossly normal. No evidence of mitral valve regurgitation.  9. The tricuspid valve is grossly normal. 10. The tricuspid valve is grossly normal. Tricuspid valve regurgitation is not demonstrated. 11. The aortic valve is tricuspid. Aortic valve regurgitation is not visualized. No evidence of aortic valve sclerosis or stenosis. 12. The pulmonic valve was not well visualized. Pulmonic valve regurgitation is not visualized. 13. The inferior vena cava is dilated in size with >50% respiratory variability, suggesting right atrial pressure of 8 mmHg. 14. The aortic root was not well visualized. FINDINGS  Left Ventricle: Left ventricular ejection fraction, by visual estimation, is 55 to 60%. The left ventricle has normal function. Definity contrast agent was given IV to delineate the left ventricular endocardial borders. The left ventricle has no regional wall motion abnormalities. There is no left ventricular hypertrophy. Left ventricular diastolic parameters are indeterminate. Normal left atrial pressure. Right Ventricle: The right ventricular size is mildly enlarged. No increase in right ventricular wall thickness. Global RV systolic function is has normal systolic function. Left Atrium: Left atrial size was normal in size. Right Atrium: Right atrial size was normal in size Pericardium: There is no evidence of pericardial effusion. Mitral Valve: The mitral valve is grossly normal. No evidence of mitral valve  regurgitation. MV peak gradient, 4.0 mmHg. Tricuspid Valve: The tricuspid valve is grossly normal. Tricuspid valve regurgitation is not demonstrated. Aortic Valve: The aortic valve is tricuspid. Aortic valve regurgitation is not visualized. The aortic valve is structurally normal, with no evidence of sclerosis or stenosis. Aortic valve mean gradient measures 3.0 mmHg. Aortic valve peak gradient measures 5.1 mmHg. Aortic valve area, by VTI measures 2.64 cm. Pulmonic Valve: The pulmonic valve was not well visualized. Pulmonic valve regurgitation is not visualized. Pulmonic regurgitation is not visualized. Aorta: The aortic root was not well visualized. Venous: The inferior vena cava is dilated in size with greater than 50% respiratory variability, suggesting right atrial pressure of 8 mmHg. IAS/Shunts: No atrial level shunt detected by color flow Doppler.  LEFT VENTRICLE PLAX 2D LVOT diam:     2.10 cm  Diastology LVOT Area:     3.46 cm LV e' lateral:   8.49 cm/s                         LV E/e' lateral: 8.1                         LV e' medial:    8.59 cm/s                         LV E/e' medial:  8.0  RIGHT VENTRICLE             IVC RV Basal diam:  3.50 cm     IVC diam: 2.30 cm RV Mid diam:    3.40 cm RV S prime:     13.20 cm/s TAPSE (M-mode): 3.0 cm LEFT ATRIUM             Index       RIGHT ATRIUM           Index LA Vol (A2C):   49.1 ml 19.39 ml/m RA  Area:     17.60 cm LA Vol (A4C):   38.7 ml 15.28 ml/m RA Volume:   46.80 ml  18.48 ml/m LA Biplane Vol: 44.0 ml 17.37 ml/m  AORTIC VALVE AV Area (Vmax):    2.98 cm AV Area (Vmean):   2.62 cm AV Area (VTI):     2.64 cm AV Vmax:           113.00 cm/s AV Vmean:          83.500 cm/s AV VTI:            0.241 m AV Peak Grad:      5.1 mmHg AV Mean Grad:      3.0 mmHg LVOT Vmax:         97.30 cm/s LVOT Vmean:        63.100 cm/s LVOT VTI:          0.184 m LVOT/AV VTI ratio: 0.76 MITRAL VALVE MV Area (PHT): 3.03 cm             SHUNTS MV Peak grad:  4.0 mmHg              Systemic VTI:  0.18 m MV Mean grad:  2.0 mmHg             Systemic Diam: 2.10 cm MV Vmax:       1.00 m/s MV Vmean:      60.8 cm/s MV VTI:        0.34 m MV PHT:        72.50 msec MV Decel Time: 250 msec MV E velocity: 68.60 cm/s 103 cm/s MV A velocity: 71.60 cm/s 70.3 cm/s MV E/A ratio:  0.96       1.5  Kate Sable MD Electronically signed by Kate Sable MD Signature Date/Time: 10/03/2019/2:50:01 PM    Final     Cardiac Studies   Echo 10/03/19 1. Left ventricular ejection fraction, by visual estimation, is 55 to 60%. The left ventricle has normal function. Unable to assess for left ventricular hypertrophy.  2. Definity contrast agent was given IV to delineate the left ventricular endocardial borders.  3. Left ventricular diastolic parameters are indeterminate.  4. The left ventricle has no regional wall motion abnormalities.  5. Global right ventricle has normal systolic function.The right ventricular size is mildly enlarged. No increase in right ventricular wall thickness.  6. Left atrial size was normal.  7. Right atrial size was normal.  8. The mitral valve is grossly normal. No evidence of mitral valve regurgitation.  9. The tricuspid valve is grossly normal. 10. The tricuspid valve is grossly normal. Tricuspid valve regurgitation is not demonstrated. 11. The aortic valve is tricuspid. Aortic valve regurgitation is not visualized. No evidence of aortic valve sclerosis or stenosis. 12. The pulmonic valve was not well visualized. Pulmonic valve regurgitation is not visualized. 13. The inferior vena cava is dilated in size with >50% respiratory variability, suggesting right atrial pressure of 8 mmHg. 14. The aortic root was not well visualized.  Cardiac Cath 10/04/19  Mid LAD lesion is 60% stenosed. FFR was 0.78.  A drug-eluting stent was successfully placed using a SYNERGY XD 3.0X20.  Post intervention, there is a 0% residual stenosis.  The left ventricular systolic function is  normal.  LV end diastolic pressure is normal. LVEDP 14 mm Hg.  The left ventricular ejection fraction is 55-65% by visual estimate.  There is no aortic valve stenosis.   Single vessel CAD.  Continue aspirin  and Brilinta for 12 months.  If Brilinta is too expensive, could switch to clopidogrel after 1 month.   He needs aggressive risk factor modification including weight loss and DM control.   Coronary Diagrams  Diagnostic Dominance: Right  Intervention      Patient Profile     54 y.o. male with pmh of HTN, hyperlipidemia, moderate CAD by cath 2017, DM2, hypothyroidism, morbid obesity, remore tobacco use, and GERD who is being seen for chest pain.   Assessment & Plan   Chest pain - Chest pain concerning for UA with DOE for the past few weeks.  - HS troponins are unremarkable and EKG non-ischemic - Cardiac Cath showed mid LAD lesion 60% stenosed and was treated with DES - DAPT with Aspirin and Brilinta for 12 months with the possibility of switching to Plavix.  - Echo showed EF 55-60%, no WMA - Continue aggressive risk factor management - No further CP - Cardiac Rehab to work with patient - Cath site right radial is stable - Labs show creatinine 1.12, Hgb 13.7  HLD - continue simvastatin  HTN - continue losartan - Pressures stable - With trace edema on exam can consider low dose diuretic  DM2 - per IM  Sleep Apnea - Does not use CPAP - might need repeat outpatient sleep study  GERD - continue Prilosec   For questions or updates, please contact [facility] Please consult [email or web address] for contact info under        Signed, Kaityln Kallstrom Ninfa Meeker, PA-C  10/05/2019, 6:36 AM

## 2019-10-05 NOTE — Progress Notes (Signed)
CARDIAC REHAB PHASE I   PRE:  Rate/Rhythm: 79 SR  BP:  Supine: 125/88  Sitting:   Standing:    SaO2: 93%RA  MODE:  Ambulation: 470 ft   POST:  Rate/Rhythm: 90 SR  BP:  Supine:   Sitting: 111/70  Standing:    SaO2: 96%RA [id number] Pt walked 470 ft on RA with steady gait and no CP. Tolerated well. Education completed with pt who voiced understanding. Stressed importance of brilinta with stent. Reviewed NTG use, counting carbs and heart healthy food choices, walking for ex, weight loss, risk factors and CRP 2. Referred to [place] CRP 2. Pt used to go to gym until shut down. Pt is very interested in losing weight. Would be interested in Virtual APP and/ telephone calls to assist with his achieving this. Pt is interested in participating in Virtual Cardiac and Pulmonary Rehab. Pt advised that Virtual Cardiac and Pulmonary Rehab is provided at no cost to the patient.  Checklist:  1. Pt has smart device  ie smartphone and/or ipad for downloading an app  Yes 2. Reliable internet/wifi service    Yes 3. Understands how to use their smartphone and navigate within an app.  Yes   Pt verbalized understanding and is in agreement.    Graylon Good, RN BSN  10/05/2019 9:21 AM

## 2019-10-05 NOTE — Care Management (Signed)
1025 10-05-19 Patient is without insurance at this time. Patient will have the first 30 day free Brilinta and will probably be changed to Plavix. Patient has primary care in [facility] and patient can schedule a hospital follow up appointment. Patient's medications will be delivered to the room via [facility]- patient able to pay the $18.00 cost. Patient had concerns about his blood sugars- states they have been elevated while still taking metformin and glipizide. Case Manager asked staff RN to call physician to see if he wants patient to follow up at the primary care office regarding diabetes vs having the diabetes coordinator to speak with the patient. No further needs from Case Manager at this time. Bethena Roys, RN,BSN Case Manager

## 2019-10-05 NOTE — Discharge Instructions (Signed)
Angina  Angina is very bad discomfort or pain in the chest, neck, arm, jaw, or back. The discomfort is caused by a lack of blood in the middle layer of the heart wall (myocardium). What are the causes? This condition is caused by a buildup of fat and cholesterol (plaque) in your arteries (atherosclerosis). This buildup narrows the arteries and makes it hard for blood to flow. What increases the risk? You are more likely to develop this condition if:  You have high levels of cholesterol in your blood.  You have high blood pressure (hypertension).  You have diabetes.  You have a family history of heart disease.  You are not active, or you do not exercise enough.  You feel sad (depressed).  You have been treated with high energy rays (radiation) on the left side of your chest. Other risk factors are:  Using tobacco.  Being very overweight (obese).  Eating a diet high in unhealthy fats (saturated fats).  Having stress, or being exposed to things that cause stress.  Using drugs, such as cocaine. Women have a greater risk for angina if:  They are older than 55.  They have stopped having their period (are in postmenopause). What are the signs or symptoms? Common symptoms of this condition in both men and women may include:  Chest pain, which may: ? Feel like a crushing or squeezing in the chest. ? Feel like a tightness, pressure, fullness, or heaviness in the chest. ? Last for more than a few minutes at a time. ? Stop and come back (recur) after a few minutes.  Pain in the neck, arm, jaw, or back.  Heartburn or upset stomach (indigestion) for no reason.  Being short of breath.  Feeling sick to your stomach (nauseous).  Sudden cold sweats. Women and people with diabetes may have other symptoms that are not usual, such as feeling:  Tired (fatigue).  Worried or nervous (anxious) for no reason.  Weak for no reason.  Dizzy or passing out (fainting). How is this  treated? This condition may be treated with:  Medicines. These are given to: ? Prevent blood clots. ? Prevent heart attack. ? Relax blood vessels and improve blood flow to the heart (nitrates). ? Reduce blood pressure. ? Improve the pumping action of the heart. ? Reduce fat and cholesterol in the blood.  A procedure to widen a narrowed or blocked artery in the heart (angioplasty).  Surgery to allow blood to go around a blocked artery (coronary artery bypass surgery). Follow these instructions at home: Medicines  Take over-the-counter and prescription medicines only as told by your doctor.  Do not take these medicines unless your doctor says that you can: ? NSAIDs. These include:  Ibuprofen.  Naproxen. ? Vitamin supplements that have vitamin A, vitamin E, or both. ? Hormone therapy that contains estrogen with or without progestin. Eating and drinking   Eat a heart-healthy diet that includes: ? Lots of fresh fruits and vegetables. ? Whole grains. ? Low-fat (lean) protein. ? Low-fat dairy products.  Follow instructions from your doctor about what you cannot eat or drink. Activity  Follow an exercise program that your doctor tells you.  Talk with your doctor about joining a program to help improve the health of your heart (cardiac rehab).  When you feel tired, take a break. Plan breaks if you know you are going to feel tired. Lifestyle   Do not use any products that contain nicotine or tobacco. This includes cigarettes, e-cigarettes, and   chewing tobacco. If you need help quitting, ask your doctor.  If your doctor says you can drink alcohol: ? Limit how much you use to:  0-1 drink a day for women who are not pregnant.  0-2 drinks a day for men. ? Be aware of how much alcohol is in your drink. In the [place] one drink equals:  One 12 oz bottle of beer (355 mL).  One 5 oz glass of wine (148 mL).  One 1 oz glass of hard liquor (44 mL). General instructions  Stay  at a healthy weight. If your doctor tells you to do so, work with him or her to lose weight.  Learn to deal with stress. If you need help, ask your doctor.  Keep your vaccines up to date. Get a flu shot every year.  Talk with your doctor if you feel sad. Take a screening test to see if you are at risk for depression.  Work with your doctor to manage any other health problems that you have. These may include diabetes or high blood pressure.  Keep all follow-up visits as told by your doctor. This is important. Get help right away if:  You have pain in your chest, neck, arm, jaw, or back, and the pain: ? Lasts more than a few minutes. ? Comes back. ? Does not get better after you take medicine under your tongue (sublingual nitroglycerin). ? Keeps getting worse. ? Comes more often.  You have any of these problems for no reason: ? Sweating a lot. ? Heartburn or upset stomach. ? Shortness of breath. ? Trouble breathing. ? Feeling sick to your stomach. ? Throwing up (vomiting). ? Feeling more tired than normal. ? Feeling nervous or worrying more than normal. ? Weakness.  You are suddenly dizzy or light-headed.  You pass out. These symptoms may be an emergency. Do not wait to see if the symptoms will go away. Get medical help right away. Call your local emergency services (911 in the [place] Do not drive yourself to the hospital. Summary  Angina is very bad discomfort or pain in the chest, neck, arm, neck, or back.  Symptoms include chest pain, heartburn or upset stomach for no reason, and shortness of breath.  Women or people with diabetes may have symptoms that are not usual, such as feeling nervous or worried for no reason, weak for no reason, or tired.  Take all medicines only as told by your doctor.  You should eat a heart-healthy diet and follow an exercise program. This information is not intended to replace advice given to you by your health care provider. Make sure you  discuss any questions you have with your health care provider. Document Revised: 04/13/2018 Document Reviewed: 04/13/2018 Elsevier Patient Education  2020 [employer] Inc.  

## 2019-10-06 ENCOUNTER — Encounter (HOSPITAL_COMMUNITY): Payer: Self-pay | Admitting: *Deleted

## 2019-10-06 ENCOUNTER — Telehealth: Payer: Self-pay | Admitting: Family Medicine

## 2019-10-06 NOTE — Progress Notes (Signed)
Referral received and verified for MD signature.  Follow up appt is 10/20/19.  Due to the senior leadership directed instructions to pause onsite cardiac rehab in response to the surge of Covid + patients within the health system staff were deployed to support health system and community needs.  Pt is however eligible to participate in our virtual platform due to his insurance status.  Pt will be contacted for scheduling Virtual [place] upon satisfactorily completing their follow up appt on 10/20/19. Cherre Huger, BSN Cardiac and Training and development officer

## 2019-10-06 NOTE — Telephone Encounter (Signed)
Ok to make on same day

## 2019-10-06 NOTE — Telephone Encounter (Signed)
Does this need tcm call?

## 2019-10-06 NOTE — Telephone Encounter (Signed)
He wouldn't be eligible for a TCM because Medicaid wont reimburse for TCMs unfortunately.

## 2019-10-06 NOTE — Telephone Encounter (Signed)
He wouldn't be eligible for a TCM because unfortunately Medicaid won't cover TCM visits.   He can still come in for a follow up if needed but it will just be an office visit.

## 2019-10-06 NOTE — Telephone Encounter (Signed)
Patient called in wanting an appointment for hospital follow up for having a stunt put in to his heart. Is it okay to schedule for a Same Day, next available is on 10/22/19, hospital wanted him to be scheduled for a week after discharge.

## 2019-10-07 ENCOUNTER — Telehealth: Payer: Self-pay

## 2019-10-07 ENCOUNTER — Telehealth: Payer: Self-pay | Admitting: *Deleted

## 2019-10-07 DIAGNOSIS — G4730 Sleep apnea, unspecified: Secondary | ICD-10-CM

## 2019-10-07 NOTE — Telephone Encounter (Signed)
Second attempt to reach patient for [facility] call.  Patient scheduled for visit on 10/08/19

## 2019-10-07 NOTE — Telephone Encounter (Signed)
Scheduled pt hospital f/u. Pt only has medicaid.

## 2019-10-07 NOTE — Telephone Encounter (Signed)
-----   Message from Lauralee Evener, [place] sent at 10/06/2019  8:17 AM EST ----- Regarding: RE: precert Ok to schedule. No PA is required. ----- Message ----- From: Freada Bergeron, CMA Sent: 10/05/2019   1:22 PM EST To: [facility] Sleep Studies Subject: precert                                        HST ----- Message ----- From: Antony Madura, PA-C Sent: 10/05/2019   1:09 PM EST To: Freada Bergeron, CMA Subject: Home Sleep Study                               Good afternoon, I rounded on this patient with Dr. Radford Pax in the hospital. He has been discharged today and needs a home sleep study. She told me to let you know and you would be able to get that taken care of. Please let me know if there is anything I need to do.   Thanks! Cadence

## 2019-10-07 NOTE — Telephone Encounter (Signed)
Left message for patient to return call.  Will make another attempt.  Patient scheduled for 10/08/19 with PCP

## 2019-10-08 ENCOUNTER — Encounter: Payer: Self-pay | Admitting: Family Medicine

## 2019-10-08 ENCOUNTER — Other Ambulatory Visit: Payer: Self-pay

## 2019-10-08 ENCOUNTER — Ambulatory Visit (INDEPENDENT_AMBULATORY_CARE_PROVIDER_SITE_OTHER): Payer: Self-pay | Admitting: Family Medicine

## 2019-10-08 VITALS — BP 122/68 | HR 89 | Temp 97.6°F | Ht 69.0 in | Wt 320.8 lb

## 2019-10-08 DIAGNOSIS — E1169 Type 2 diabetes mellitus with other specified complication: Secondary | ICD-10-CM

## 2019-10-08 DIAGNOSIS — Z1211 Encounter for screening for malignant neoplasm of colon: Secondary | ICD-10-CM

## 2019-10-08 DIAGNOSIS — I10 Essential (primary) hypertension: Secondary | ICD-10-CM

## 2019-10-08 DIAGNOSIS — E039 Hypothyroidism, unspecified: Secondary | ICD-10-CM

## 2019-10-08 DIAGNOSIS — E785 Hyperlipidemia, unspecified: Secondary | ICD-10-CM

## 2019-10-08 DIAGNOSIS — E119 Type 2 diabetes mellitus without complications: Secondary | ICD-10-CM

## 2019-10-08 DIAGNOSIS — I2510 Atherosclerotic heart disease of native coronary artery without angina pectoris: Secondary | ICD-10-CM

## 2019-10-08 MED ORDER — INSULIN NPH (HUMAN) (ISOPHANE) 100 UNIT/ML ~~LOC~~ SUSP: 5.0000 [IU] | Freq: Every day | SUBCUTANEOUS | 11 refills | Status: DC

## 2019-10-08 NOTE — Patient Instructions (Addendum)
Health Maintenance Due  Topic Date Due  . PNEUMOCOCCAL POLYSACCHARIDE VACCINE AGE 53-64 HIGH RISK - defer until you have insurance 04/21/1969  . OPHTHALMOLOGY EXAM -need to schedule your eye exam- please do this 10/26/2016  . COLONOSCOPY - deferred until has insurance 04/21/2017  . FOOT EXAM -wants to wait until next visit 06/27/2018   Start Relion NPH insulin. This is an intermediate insulin which stays in system up to 12 hours. Take in morning before breakfast just 5 units. Update me in 1 week with how sugars are doing. Our goal is to get morning sugars between 90-120.   Schedule lab visit in 6 weeks  Strep swab for sore throat before he goes please  If strep negative- trial flonase over the counter or claritin- wonder if drainage is causing sore throat

## 2019-10-08 NOTE — Assessment & Plan Note (Addendum)
#  Hospital follow-up  S: Nonobstructive CAD previously diagnosed patient presented to the hospital on October 03, 2019 with chest pain and shortness of breath.  EKG without ischemic changes.  Cardiac troponin cycled and not elevated.  Chest x-ray reassuring.  Cardiology was consulted.  PE/DVT ruled out with negative D-dimer.  CT of the chest was also obtained which showed no findings to account for left chest pain.  Ultimately patient underwent cardiac catheterization was discovered to have single-vessel coronary artery disease and drug-eluting stent was placed-patient was started on aspirin and Plavix per discharge summary-medication list showed Brilinta and asa 81mg .  Patient denies having use nitroglycerin since being at home-he has had slight twinges of chest pain in the left chest but no central chest pain like he had previously.  Feels mildly winded at times but seems to be improving.  He was started on atorvastatin 80 mg daily increased from baseline simvastatin. A/P: Now with obstructive coronary artery disease-on max dose statin atorvastatin 80 mg, aspirin, Brilinta (could cause mild shortness of breath). Will have to be on Brilinta or Plavix for at least a year due to drug-eluting stent.  Brilinta can be an expensive medication-but I reviewed drug prices through [facility] for Plavix and he thinks he can afford with these options if he needs to switch. -Has a history of sleep apnea and cardiology is arranging outpatient sleep study. -See hyperlipidemia section -Needs to make sure to follow-up with cardiology.  He is doing well with some light stationary bike but they have also suggested potentially doing cardiac rehab.  -He knows if he has to use nitroglycerin twice to go ahead and call emergency room-fortunately has not had to use at all since being home -Also reviewed echocardiogram from hospitalization with EF of 55% and otherwise largely normal.

## 2019-10-08 NOTE — Telephone Encounter (Signed)
Patient is aware and agreeable to Home Sleep Study through [facility]. Patient is scheduled for 12/09/19 at 12 pm to pick up home sleep kit and meet with Respiratory therapist at [facility]. Patient is aware that if this appointment date and time does not work for them they should contact Artis Delay directly at [phone]. Patient is aware that a sleep packet will be sent from [facility] in week. Patient is agreeable to treatment and thankful for call.

## 2019-10-08 NOTE — Progress Notes (Signed)
Phone [phone]   Subjective:  Gabriel [name] is a 54 y.o. year old very pleasant male patient who presents for transitional care management and hospital follow up for chest pain. Patient was hospitalized from 10/03/19 to 10/05/19. Patient without insurance so TCM not billed.    See problem oriented charting as well  Past Medical History-  Patient Active Problem List   Diagnosis Date Noted  . Coronary Artery Disease 08/26/2013    Priority: High  . Diabetes mellitus without complication ([place]) [id number]    Priority: High  . 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 Morbid obesity ([place]) 08/10/2013    Priority: Medium  . Essential hypertension 02/06/2011    Priority: Medium  . Hypothyroidism 02/06/2011    Priority: Medium  . Hyperlipidemia associated with type 2 diabetes mellitus ([place]) 02/06/2011    Priority: Medium  . Colon cancer screening 10/27/2018    Priority: Low  . Family hx colonic polyps 09/27/2018    Priority: Low  . GERD (gastroesophageal reflux disease) 02/01/2015    Priority: Low  . Dysphagia 09/30/2013    Priority: Low  . Unstable angina ([place])   . Back pain 10/03/2019  . Midepigastric pain 09/27/2018  . Change in bowel habits 09/27/2018  . BRBPR (bright red blood per rectum) 09/27/2018  . Left lateral epicondylitis 03/18/2016    Medications- reviewed and updated  A medical reconciliation was performed comparing current medicines to hospital discharge medications. Current Outpatient Medications  Medication Sig Dispense Refill  . acetaminophen (TYLENOL) 500 MG tablet Take 1,000 mg by mouth every 6 (six) hours as needed for mild pain or moderate pain. Reported on 01/09/2016    . aspirin EC 81 MG EC tablet Take 1 tablet (81 mg total) by mouth daily.    [name] atorvastatin (LIPITOR) 80 MG tablet Take 1 tablet (80 mg total) by mouth daily at 6 PM. 30 tablet 0  . glimepiride (AMARYL) 4 MG tablet TAKE 2 TABLET BY MOUTH ONCE DAILY BEFORE   BREAKFAST (Patient taking differently: Take 8 mg by mouth daily with breakfast. ) 180 tablet 3  . glucose blood (ONE TOUCH TEST STRIPS) test strip Up to 4 times daily.  Onetouch Verio.  E11.65 100 each 12  . Lancets (ONETOUCH ULTRASOFT) lancets Up to 4 times daily.  Onetouch Verio.  DxE11.65 100 each 12  . levothyroxine (SYNTHROID) 125 MCG tablet Take 1 tablet (125 mcg total) by mouth daily before breakfast. 30 tablet 0  . losartan (COZAAR) 100 MG tablet Take 1 tablet by mouth once daily (Patient taking differently: Take 100 mg by mouth daily. ) 90 tablet 1  . metFORMIN (GLUCOPHAGE) 1000 MG tablet Take 1 tablet (1,000 mg total) by mouth 2 (two) times daily with a meal. 180 tablet 3  . Multiple Vitamins-Minerals (MULTIVITAMIN,TX-MINERALS) tablet Take 1 tablet by mouth daily.      . nitroGLYCERIN (NITROSTAT) 0.4 MG SL tablet Place 1 tablet (0.4 mg total) under the tongue every 5 (five) minutes as needed for chest pain (CP or SOB). 25 tablet 3  . Omega-3 Fatty Acids (FISH OIL) 1000 MG CAPS Take 1,000 mg by mouth daily.     [name] omeprazole (PRILOSEC) 20 MG capsule Take 1 capsule (20 mg total) by mouth daily. 30 capsule 3  . ticagrelor (BRILINTA) 90 MG TABS tablet Take 1 tablet (90 mg total) by mouth 2 (two) times daily. 60 tablet 0  . insulin NPH Human (NOVOLIN N) 100 UNIT/ML injection Inject 0.05-0.15  mLs (5-15 Units total) into the skin daily before breakfast. 10 mL 11   No current facility-administered medications for this visit.   Objective  Objective:  BP 122/68   Pulse 89   Temp 97.6 F (36.4 C)   Ht 5\' 9"  (1.753 m)   Wt (!) 320 lb 12.8 oz (145.5 kg)   SpO2 96%   BMI 47.37 kg/m  Gen: NAD, resting comfortably Mildly erythematous pharynx-mildly tender diffusely through neck CV: RRR no murmurs rubs or gallops Lungs: CTAB no crackles, wheeze, rhonchi Abdomen: soft/nontender/nondistended/normal bowel sounds. No rebound or guarding.  Ext: no edema Skin: warm, dry   No results found for  this or any previous visit (from the past 24 hour(s)).    Assessment and Plan:   Diabetes mellitus without complication (HCC) S: Compliant with metformin 1000 mg BID, glimepiride 8 mg CBGs- reports sugars have een much higher over last 3 months. In last 2 months lowest # was not under 120 Exercise and diet-trying to begin exercising again on stationary bike Lab Results  Component Value Date   HGBA1C 8.1 (H) 10/03/2019   HGBA1C 7.3 (H) 06/25/2019   HGBA1C 6.8 (H) 03/11/2019   A/P: Poor control.  Medications are very expensive for patient so we have to choose other medications/generics.  Traditionally I would want to choose either Ozempic or Jardiance as next step.  Due to cost constraints we opted to go with NPH from [facility] with 5 units daily and he will update me in 1 week.  Did not specifically discuss but would recommend 55-month follow-up at the latest.  He has been able to get his blood sugar down in the past with healthy eating/regular exercise and also encouraged him to begin these efforts again  Coronary Artery Disease #Hospital follow-up  S: Nonobstructive CAD previously diagnosed patient presented to the hospital on October 03, 2019 with chest pain and shortness of breath.  EKG without ischemic changes.  Cardiac troponin cycled and not elevated.  Chest x-ray reassuring.  Cardiology was consulted.  PE/DVT ruled out with negative D-dimer.  CT of the chest was also obtained which showed no findings to account for left chest pain.  Ultimately patient underwent cardiac catheterization was discovered to have single-vessel coronary artery disease and drug-eluting stent was placed-patient was started on aspirin and Plavix per discharge summary-medication list showed Brilinta and asa 81mg .  Patient denies having use nitroglycerin since being at home-he has had slight twinges of chest pain in the left chest but no central chest pain like he had previously.  Feels mildly winded at times but  seems to be improving.  He was started on atorvastatin 80 mg daily increased from baseline simvastatin. A/P: Now with obstructive coronary artery disease-on max dose statin atorvastatin 80 mg, aspirin, Brilinta (could cause mild shortness of breath). Will have to be on Brilinta or Plavix for at least a year due to drug-eluting stent.  Brilinta can be an expensive medication-but I reviewed drug prices through [facility] for Plavix and he thinks he can afford with these options if he needs to switch. -Has a history of sleep apnea and cardiology is arranging outpatient sleep study. -See hyperlipidemia section -Needs to make sure to follow-up with cardiology.  He is doing well with some light stationary bike but they have also suggested potentially doing cardiac rehab.  -He knows if he has to use nitroglycerin twice to go ahead and call emergency room-fortunately has not had to use at all since being home -  Also reviewed echocardiogram from hospitalization with EF of 55% and otherwise largely normal.  Hyperlipidemia associated with type 2 diabetes mellitus ([place]) #hyperlipidemia S: compliant with atorvastatin 80 mg.  Increase from simvastatin 40 mg.  Previously LDL was below 70 on simvastatin but during hospitalization was noted to be 75-more aggressive statin therapy used due to progression to obstructive CAD despite good LDL control Lab Results  Component Value Date   CHOL 139 10/03/2019   HDL 32 (L) 10/03/2019   LDLCALC 75 10/03/2019   LDLDIRECT 68.0 03/11/2019   TRIG 161 (H) 10/03/2019   CHOLHDL 4.3 10/03/2019   A/P: Hopefully improved control-update lipid panel at follow-up-ordered today    Hypothyroidism Levothyroxine was increased to 125 mcg from 100 mcg while hospitalized due to TSH over 6.  We ordered repeat TSH for 6 weeks-I typically would only increase to 112 mcg in this range but since he is already picked up this medication we opted to continue current dose and change only if TSH is  elevated at 6-week repeat which we ordered today -We also ordered CBC, CMP, lipid panel for 6 weeks-hospital discharge summary stated CBC/BMP today but I do not see any pressing concerns for CBC/BMP so opted to defer until repeat blood work in 6 weeks  #Sore throat-for last 6 weeks has felt some soreness in the bottom of his neck up through chin area.  TSH was mildly off but not sure if that is the cause.  Mildly erythematous pharynx-wonder about postnasal drip.  Team was supposed to swab for strep-we will check to see if they did so.  Plan was for trial of Flonase or Claritin in case postnasal drip contributing-patient should follow-up if not improving  #Considered updating colon cancer screening with fecal occult cards-patient cannot afford other options-he wants to wait and see if he can get insurance first and will check back with me in March  Recommended follow up: Scheduled for diabetes follow-up in March Future Appointments  Date Time Provider [place]  10/20/2019 11:15 AM Lendon Colonel, NP [facility]  11/22/2019  4:00 PM Marin Olp, MD [facility]  12/09/2019 12:00 PM Sueanne Margarita, MD [facility]    Lab/Order associations:   ICD-10-CM   1. Atherosclerosis of native coronary artery of native heart without angina pectoris  I25.10   2. Diabetes mellitus without complication (HCC)  [id number] Comprehensive metabolic panel    CBC  3. Hypothyroidism, unspecified type  E03.9 TSH  4. Hyperlipidemia associated with type 2 diabetes mellitus (HCC)  E11.69 Comprehensive metabolic panel   [id number] CBC    Lipid panel  5. Essential hypertension  I10 Comprehensive metabolic panel    CBC    Lipid panel  6. Screen for colon cancer  Z12.11 CANCELED: Fecal occult blood, imunochemical    Meds ordered this encounter  Medications  . insulin NPH Human (NOVOLIN N) 100 UNIT/ML injection    Sig: Inject 0.05-0.15 mLs (5-15 Units total) into the skin daily before breakfast.     Dispense:  10 mL    Refill:  11    Return precautions advised.  Garret Reddish, MD

## 2019-10-08 NOTE — Assessment & Plan Note (Signed)
Levothyroxine was increased to 125 mcg from 100 mcg while hospitalized due to TSH over 6.  We ordered repeat TSH for 6 weeks-I typically would only increase to 112 mcg in this range but since he is already picked up this medication we opted to continue current dose and change only if TSH is elevated at 6-week repeat which we ordered today -We also ordered CBC, CMP, lipid panel for 6 weeks-hospital discharge summary stated CBC/BMP today but I do not see any pressing concerns for CBC/BMP so opted to defer until repeat blood work in 6 weeks

## 2019-10-08 NOTE — Assessment & Plan Note (Signed)
#  hyperlipidemia S: compliant with atorvastatin 80 mg.  Increase from simvastatin 40 mg.  Previously LDL was below 70 on simvastatin but during hospitalization was noted to be 75-more aggressive statin therapy used due to progression to obstructive CAD despite good LDL control Lab Results  Component Value Date   CHOL 139 10/03/2019   HDL 32 (L) 10/03/2019   LDLCALC 75 10/03/2019   LDLDIRECT 68.0 03/11/2019   TRIG 161 (H) 10/03/2019   CHOLHDL 4.3 10/03/2019   A/P: Hopefully improved control-update lipid panel at follow-up-ordered today

## 2019-10-08 NOTE — Assessment & Plan Note (Signed)
S: Compliant with metformin 1000 mg BID, glimepiride 8 mg CBGs- reports sugars have een much higher over last 3 months. In last 2 months lowest # was not under 120 Exercise and diet-trying to begin exercising again on stationary bike Lab Results  Component Value Date   HGBA1C 8.1 (H) 10/03/2019   HGBA1C 7.3 (H) 06/25/2019   HGBA1C 6.8 (H) 03/11/2019   A/P: Poor control.  Medications are very expensive for patient so we have to choose other medications/generics.  Traditionally I would want to choose either Ozempic or Jardiance as next step.  Due to cost constraints we opted to go with NPH from [facility] with 5 units daily and he will update me in 1 week.  Did not specifically discuss but would recommend 49-month follow-up at the latest.  He has been able to get his blood sugar down in the past with healthy eating/regular exercise and also encouraged him to begin these efforts again

## 2019-10-18 ENCOUNTER — Telehealth: Payer: Self-pay | Admitting: Family Medicine

## 2019-10-18 NOTE — Telephone Encounter (Signed)
See below

## 2019-10-18 NOTE — Telephone Encounter (Signed)
Patient called In this morning saying Gabriel [name] asked for him to call back with his readings  -157 10/18/19 -145 10/17/19 -150 10/16/19 -130 10/15/19 -130 10/14/19 Daiki says it has not gotten any lower than 130

## 2019-10-18 NOTE — Progress Notes (Deleted)
Cardiology Office Note   Date:  10/18/2019   ID:  Gabriel [name], DOB August 09, 1966, MRN [id number]  PCP:  Gabriel Olp, MD  Cardiologist:  Dr. Percival [name]  No chief complaint on file.    History of Present Illness: Gabriel [name] is a 54 y.o. male who presents for post hospitalization for chest pain. She has a history of moderate CAD with stenosis of the LAD per cath in 2017, stress perfusion performed with a medium size moderate defect in the basal inferoseptal and mid anteroseptal as well as mid inferoseptal apical region after this he was cathed and was found to have a 40% LAD stenosis. Other history, hypertension, hyperlipidemia, DM Type 2, hypothyroidism, morbid obesity, remote tobacco abuse, and GERD.   Due to symptoms and concern for anginal equivalent, cardiac cath was performed on 10/04/2019. This revealed single vessel disease, with mid LAD of 60% FFR 0.78. DES was placed using Synergy XD, 23.0 x 20, with 0% stenosis post intervention. EF of 55%-60%.  He was placed on DAPT with ASA 81 mg and Ticagrelor 90 mg BID.  Past Medical History:  Diagnosis Date  . Arthritis    "joints; from where I've had surgeries" (08/17/2013)  . Chest pain    a. 08/2013 Cardia CTA: Ca score 238 (95%), LM nl, LAD mod stenosis in D2 area, D1 mild ost stenosis, D2 no signif dzs, LCX small, RCA nl.  . Coronary artery disease    LHC (08/18/13):  pLAD 50, CFX and RCA normal.  EF 55-65%.  LAD lesion FFR:  0.88 (normal).  Med rx recommended.    [name] Dysphagia    Upper endoscopy 11/2013 - without obvious stricture s/p Maloney dilation.   [name] GERD (gastroesophageal reflux disease)   . Hyperlipidemia    a. Dx 3y ago - not on statin.  [name] Hypertension    a. Dx 3y ago.  [name] Hypothyroidism    a. on replacement.  . IBS (irritable bowel syndrome)    colonoscopy 2000  . Obesity   . Sleep apnea    does not use CPAP  . Snoring   . Type II diabetes mellitus ([place])     Past Surgical History:  Procedure Laterality Date   . ANKLE SURGERY Right    "had a growth in it; cut it out" (08/17/2013)  . CARDIAC CATHETERIZATION N/A 09/28/2015   Procedure: Left Heart Cath and Coronary Angiography;  Surgeon: Gabriel M Martinique, MD;  Location: [place] [facility];  Service: Cardiovascular;  Laterality: N/A;  . CHOLECYSTECTOMY    . CORONARY STENT INTERVENTION N/A 10/04/2019   Procedure: CORONARY STENT INTERVENTION;  Surgeon: Gabriel Booze, MD;  Location: [place] [facility];  Service: Cardiovascular;  Laterality: N/A;  . INTRAVASCULAR PRESSURE WIRE/FFR STUDY N/A 10/04/2019   Procedure: INTRAVASCULAR PRESSURE WIRE/FFR STUDY;  Surgeon: Gabriel Booze, MD;  Location: [place] [facility];  Service: Cardiovascular;  Laterality: N/A;  . KNEE ARTHROSCOPY Right X 2  . LEFT HEART CATH AND CORONARY ANGIOGRAPHY N/A 10/04/2019   Procedure: LEFT HEART CATH AND CORONARY ANGIOGRAPHY;  Surgeon: Gabriel Booze, MD;  Location: [place] [facility];  Service: Cardiovascular;  Laterality: N/A;  . LEFT HEART CATHETERIZATION WITH CORONARY ANGIOGRAM N/A 08/18/2013   Procedure: LEFT HEART CATHETERIZATION WITH CORONARY ANGIOGRAM;  Surgeon: Gabriel M Martinique, MD;  Location: [facility];  Service: Cardiovascular;  Laterality: N/A;  . SHOULDER ARTHROSCOPY W/ ROTATOR CUFF REPAIR Left      Current Outpatient Medications  Medication  Sig Dispense Refill  . acetaminophen (TYLENOL) 500 MG tablet Take 1,000 mg by mouth every 6 (six) hours as needed for mild pain or moderate pain. Reported on 01/09/2016    . aspirin EC 81 MG EC tablet Take 1 tablet (81 mg total) by mouth daily.    [name] atorvastatin (LIPITOR) 80 MG tablet Take 1 tablet (80 mg total) by mouth daily at 6 PM. 30 tablet 0  . glimepiride (AMARYL) 4 MG tablet TAKE 2 TABLET BY MOUTH ONCE DAILY BEFORE  BREAKFAST (Patient taking differently: Take 8 mg by mouth daily with breakfast. ) 180 tablet 3  . glucose blood (ONE TOUCH TEST STRIPS) test strip Up to 4 times daily.  Onetouch Verio.  E11.65 100 each  12  . insulin NPH Human (NOVOLIN N) 100 UNIT/ML injection Inject 0.05-0.15 mLs (5-15 Units total) into the skin daily before breakfast. 10 mL 11  . Lancets (ONETOUCH ULTRASOFT) lancets Up to 4 times daily.  Onetouch Verio.  DxE11.65 100 each 12  . levothyroxine (SYNTHROID) 125 MCG tablet Take 1 tablet (125 mcg total) by mouth daily before breakfast. 30 tablet 0  . losartan (COZAAR) 100 MG tablet Take 1 tablet by mouth once daily (Patient taking differently: Take 100 mg by mouth daily. ) 90 tablet 1  . metFORMIN (GLUCOPHAGE) 1000 MG tablet Take 1 tablet (1,000 mg total) by mouth 2 (two) times daily with a meal. 180 tablet 3  . Multiple Vitamins-Minerals (MULTIVITAMIN,TX-MINERALS) tablet Take 1 tablet by mouth daily.      . nitroGLYCERIN (NITROSTAT) 0.4 MG SL tablet Place 1 tablet (0.4 mg total) under the tongue every 5 (five) minutes as needed for chest pain (CP or SOB). 25 tablet 3  . Omega-3 Fatty Acids (FISH OIL) 1000 MG CAPS Take 1,000 mg by mouth daily.     [name] omeprazole (PRILOSEC) 20 MG capsule Take 1 capsule (20 mg total) by mouth daily. 30 capsule 3  . ticagrelor (BRILINTA) 90 MG TABS tablet Take 1 tablet (90 mg total) by mouth 2 (two) times daily. 60 tablet 0   No current facility-administered medications for this visit.    Allergies:   Patient has no known allergies.    Social History:  The patient  reports that he quit smoking about 12 years ago. His smoking use included cigarettes. He has a 36.00 pack-year smoking history. He has never used smokeless tobacco. He reports that he does not drink alcohol or use drugs.   Family History:  The patient's family history includes Asthma in his maternal grandfather; Coronary artery disease in his father; Diabetes in his brother, father, and mother; Hypertension in his father and mother; Obesity in his brother and mother; Stroke in his father. He was adopted.    ROS: All other systems are reviewed and negative. Unless otherwise mentioned in H&P      PHYSICAL EXAM: VS:  There were no vitals taken for this visit. , BMI There is no height or weight on file to calculate BMI. GEN: Well nourished, well developed, in no acute distress HEENT: normal Neck: no JVD, carotid bruits, or masses Cardiac: ***RRR; no murmurs, rubs, or gallops,no edema  Respiratory:  Clear to auscultation bilaterally, normal work of breathing GI: soft, nontender, nondistended, + BS MS: no deformity or atrophy Skin: warm and dry, no rash Neuro:  Strength and sensation are intact Psych: euthymic mood, full affect   EKG:  EKG {ACTION; IS/IS [id number] ordered today. The ekg ordered today demonstrates ***   Recent  Labs: 06/25/2019: ALT 61 10/03/2019: B Natriuretic Peptide 21.6; TSH 6.628 10/05/2019: BUN 11; Creatinine, Ser 1.12; Hemoglobin 13.7; Platelets 203; Potassium 3.5; Sodium 138    Lipid Panel    Component Value Date/Time   CHOL 139 10/03/2019 0844   TRIG 161 (H) 10/03/2019 0844   HDL 32 (L) 10/03/2019 0844   CHOLHDL 4.3 10/03/2019 0844   VLDL 32 10/03/2019 0844   LDLCALC 75 10/03/2019 0844   LDLDIRECT 68.0 03/11/2019 1427      Wt Readings from Last 3 Encounters:  10/08/19 (!) 320 lb 12.8 oz (145.5 kg)  10/05/19 (!) 316 lb 9.6 oz (143.6 kg)  08/10/19 (!) 323 lb (146.5 kg)      Other studies Reviewed: Cardiac Cath 10/04/2019  Mid LAD lesion is 60% stenosed. FFR was 0.78.  A drug-eluting stent was successfully placed using a SYNERGY XD 3.0X20.  Post intervention, there is a 0% residual stenosis.  The left ventricular systolic function is normal.  LV end diastolic pressure is normal. LVEDP 14 mm Hg.  The left ventricular ejection fraction is 55-65% by visual estimate.  There is no aortic valve stenosis.   Single vessel CAD.  Continue aspirin and Brilinta for 12 months.  If Brilinta is too expensive, could switch to clopidogrel after 1 month.   Echocardiogram 10/03/2019 1. Left ventricular ejection fraction, by visual  estimation, is 55 to  60%. The left ventricle has normal function. Unable to assess for left  ventricular hypertrophy.  2. Definity contrast agent was given IV to delineate the left ventricular  endocardial borders.  3. Left ventricular diastolic parameters are indeterminate.  4. The left ventricle has no regional wall motion abnormalities.  5. Global right ventricle has normal systolic function.The right  ventricular size is mildly enlarged. No increase in right ventricular wall  thickness.  6. Left atrial size was normal.  7. Right atrial size was normal.  8. The mitral valve is grossly normal. No evidence of mitral valve  regurgitation.  9. The tricuspid valve is grossly normal.  10. The tricuspid valve is grossly normal. Tricuspid valve regurgitation  is not demonstrated.  11. The aortic valve is tricuspid. Aortic valve regurgitation is not  visualized. No evidence of aortic valve sclerosis or stenosis.  12. The pulmonic valve was not well visualized. Pulmonic valve  regurgitation is not visualized.  13. The inferior vena cava is dilated in size with >50% respiratory  variability, suggesting right atrial pressure of 8 mmHg.  14. The aortic root was not well visualized.  ASSESSMENT AND PLAN:  1.  ***   Current medicines are reviewed at length with the patient today.    Labs/ tests ordered today include: *** Gabriel [name], ANP, AACC   10/18/2019 8:29 AM    [facility] [address] Office [phone] Fax [phone]  Notice: This dictation was prepared with Dragon dictation along with smaller phrase technology. Any transcriptional errors that result from this process are unintentional and may not be corrected upon review.

## 2019-10-18 NOTE — Telephone Encounter (Signed)
Left message to return call to our office.  

## 2019-10-18 NOTE — Telephone Encounter (Signed)
If these numbers are just on the 5 units-have him continue his current dose and update me again in 2 weeks-if he gets any numbers below 90 let me know-continue to work on healthy eating and weight loss-he has been able to come off insulin in the past

## 2019-10-19 NOTE — Telephone Encounter (Signed)
Pt returned call and I gave him below instructions, pt voiced understanding.

## 2019-10-20 ENCOUNTER — Ambulatory Visit: Payer: Self-pay | Admitting: Adult Health

## 2019-10-21 ENCOUNTER — Ambulatory Visit (INDEPENDENT_AMBULATORY_CARE_PROVIDER_SITE_OTHER): Payer: Self-pay | Admitting: Adult Health

## 2019-10-21 ENCOUNTER — Other Ambulatory Visit: Payer: Self-pay

## 2019-10-21 ENCOUNTER — Encounter: Payer: Self-pay | Admitting: Adult Health

## 2019-10-21 VITALS — BP 131/86 | HR 92 | Ht 69.0 in | Wt 319.0 lb

## 2019-10-21 DIAGNOSIS — G4730 Sleep apnea, unspecified: Secondary | ICD-10-CM

## 2019-10-21 DIAGNOSIS — Z79899 Other long term (current) drug therapy: Secondary | ICD-10-CM

## 2019-10-21 DIAGNOSIS — E785 Hyperlipidemia, unspecified: Secondary | ICD-10-CM

## 2019-10-21 DIAGNOSIS — I2510 Atherosclerotic heart disease of native coronary artery without angina pectoris: Secondary | ICD-10-CM

## 2019-10-21 DIAGNOSIS — I2583 Coronary atherosclerosis due to lipid rich plaque: Secondary | ICD-10-CM

## 2019-10-21 DIAGNOSIS — I10 Essential (primary) hypertension: Secondary | ICD-10-CM

## 2019-10-21 MED ORDER — TICAGRELOR 90 MG PO TABS: 90.0000 mg | ORAL_TABLET | Freq: Two times a day (BID) | ORAL | 3 refills | Status: AC

## 2019-10-21 MED ORDER — LOSARTAN POTASSIUM 100 MG PO TABS: 100.0000 mg | ORAL_TABLET | Freq: Every day | ORAL | 3 refills | Status: DC

## 2019-10-21 MED ORDER — ATORVASTATIN CALCIUM 80 MG PO TABS: 80.0000 mg | ORAL_TABLET | Freq: Every day | ORAL | 3 refills | Status: DC

## 2019-10-21 NOTE — Progress Notes (Signed)
Cardiology Office Note   Date:  10/21/2019   ID:  Gabriel [name], DOB [birth year], MRN [id number]  PCP:  Marin Olp, MD  Cardiologist:  Dr. Percival Spanish  CC: [facility] Follow up   History of Present Illness: Gabriel [name] is a 54 y.o. male who presents for post hospitalization follow up, after consultation by Dr. Margaretann Loveless for chest pain. He has a history of HTN, HL, moderate CAD per cath in 2017 with LAD stenosis of 50% with normal FFR, Type II Diabetes, hypothyroidism, morbid obesity, GERD and remote tobacco abuse.   He had cardiac cath 10/04/2019 revealing LAD stenosis of 60% FFR was 0.78, DES was placed using a Synergy XD 3.0 X 20. LVEF 55%-65%. He was placed on DAPT with ASA and Plavix, atorvastatin was increased to 80 mg daily. He was also to resume use of her CPAP. He was also recommended for Januvia due to uncontrolled diabetes.   He comes today without any further complaints of chest discomfort.  He has a stationary bike which she is riding in his home 30 minutes a day.  He is trying to lose weight, he is working very hard at managing his blood glucose and blood pressure.  He is medically compliant, and requires refills on his cardiac medications today post hospital.  He comes with multiple questions concerning his procedure  Past Medical History:  Diagnosis Date  . Arthritis    "joints; from where I've had surgeries" (08/17/2013)  . Chest pain    a. 08/2013 Cardia CTA: Ca score 238 (95%), LM nl, LAD mod stenosis in D2 area, D1 mild ost stenosis, D2 no signif dzs, LCX small, RCA nl.  . Coronary artery disease    LHC (08/18/13):  pLAD 50, CFX and RCA normal.  EF 55-65%.  LAD lesion FFR:  0.88 (normal).  Med rx recommended.    Gabriel [name] Dysphagia    Upper endoscopy 11/2013 - without obvious stricture s/p Maloney dilation.   Gabriel [name] GERD (gastroesophageal reflux disease)   . Hyperlipidemia    a. Dx 3y ago - not on statin.  Gabriel [name] Hypertension    a. Dx 3y ago.  Gabriel [name] Hypothyroidism    a. on  replacement.  . IBS (irritable bowel syndrome)    colonoscopy 2000  . Obesity   . Sleep apnea    does not use CPAP  . Snoring   . Type II diabetes mellitus ([place])     Past Surgical History:  Procedure Laterality Date  . ANKLE SURGERY Right    "had a growth in it; cut it out" (08/17/2013)  . CARDIAC CATHETERIZATION N/A 09/28/2015   Procedure: Left Heart Cath and Coronary Angiography;  Surgeon: Peter M Martinique, MD;  Location: [place] [facility];  Service: Cardiovascular;  Laterality: N/A;  . CHOLECYSTECTOMY    . CORONARY STENT INTERVENTION N/A 10/04/2019   Procedure: CORONARY STENT INTERVENTION;  Surgeon: Jettie Booze, MD;  Location: [place] [facility];  Service: Cardiovascular;  Laterality: N/A;  . INTRAVASCULAR PRESSURE WIRE/FFR STUDY N/A 10/04/2019   Procedure: INTRAVASCULAR PRESSURE WIRE/FFR STUDY;  Surgeon: Jettie Booze, MD;  Location: [place] [facility];  Service: Cardiovascular;  Laterality: N/A;  . KNEE ARTHROSCOPY Right X 2  . LEFT HEART CATH AND CORONARY ANGIOGRAPHY N/A 10/04/2019   Procedure: LEFT HEART CATH AND CORONARY ANGIOGRAPHY;  Surgeon: Jettie Booze, MD;  Location: [place] [facility];  Service: Cardiovascular;  Laterality: N/A;  . LEFT HEART CATHETERIZATION WITH CORONARY ANGIOGRAM N/A 08/18/2013  Procedure: LEFT HEART CATHETERIZATION WITH CORONARY ANGIOGRAM;  Surgeon: Peter M Martinique, MD;  Location: [facility] LAB;  Service: Cardiovascular;  Laterality: N/A;  . SHOULDER ARTHROSCOPY W/ ROTATOR CUFF REPAIR Left      Current Outpatient Medications  Medication Sig Dispense Refill  . acetaminophen (TYLENOL) 500 MG tablet Take 1,000 mg by mouth every 6 (six) hours as needed for mild pain or moderate pain. Reported on 01/09/2016    . aspirin EC 81 MG EC tablet Take 1 tablet (81 mg total) by mouth daily.    Gabriel [name] atorvastatin (LIPITOR) 80 MG tablet Take 1 tablet (80 mg total) by mouth daily at 6 PM. 90 tablet 3  . glimepiride (AMARYL) 4 MG tablet TAKE 2 TABLET BY  MOUTH ONCE DAILY BEFORE  BREAKFAST 180 tablet 3  . glucose blood (ONE TOUCH TEST STRIPS) test strip Up to 4 times daily.  Onetouch Verio.  E11.65 100 each 12  . insulin NPH Human (NOVOLIN N) 100 UNIT/ML injection Inject 0.05-0.15 mLs (5-15 Units total) into the skin daily before breakfast. 10 mL 11  . Lancets (ONETOUCH ULTRASOFT) lancets Up to 4 times daily.  Onetouch Verio.  DxE11.65 100 each 12  . levothyroxine (SYNTHROID) 125 MCG tablet Take 1 tablet (125 mcg total) by mouth daily before breakfast. 30 tablet 0  . losartan (COZAAR) 100 MG tablet Take 1 tablet (100 mg total) by mouth daily. 90 tablet 3  . metFORMIN (GLUCOPHAGE) 1000 MG tablet Take 1 tablet (1,000 mg total) by mouth 2 (two) times daily with a meal. 180 tablet 3  . Multiple Vitamins-Minerals (MULTIVITAMIN,TX-MINERALS) tablet Take 1 tablet by mouth daily.      . nitroGLYCERIN (NITROSTAT) 0.4 MG SL tablet Place 1 tablet (0.4 mg total) under the tongue every 5 (five) minutes as needed for chest pain (CP or SOB). 25 tablet 3  . Omega-3 Fatty Acids (FISH OIL) 1000 MG CAPS Take 1,000 mg by mouth daily.     Gabriel [name] omeprazole (PRILOSEC) 20 MG capsule Take 1 capsule (20 mg total) by mouth daily. 30 capsule 3  . ticagrelor (BRILINTA) 90 MG TABS tablet Take 1 tablet (90 mg total) by mouth 2 (two) times daily. 180 tablet 3   No current facility-administered medications for this visit.    Allergies:   Patient has no known allergies.    Social History:  The patient  reports that he quit smoking about 12 years ago. His smoking use included cigarettes. He has a 36.00 pack-year smoking history. He has never used smokeless tobacco. He reports that he does not drink alcohol or use drugs.   Family History:  The patient's family history includes Asthma in his maternal grandfather; Coronary artery disease in his father; Diabetes in his brother, father, and mother; Hypertension in his father and mother; Obesity in his brother and mother; Stroke in his  father. He was adopted.    ROS: All other systems are reviewed and negative. Unless otherwise mentioned in H&P    PHYSICAL EXAM: VS:  BP 131/86   Pulse 92   Ht 5\' 9"  (1.753 m)   Wt (!) 319 lb (144.7 kg)   SpO2 96%   BMI 47.11 kg/m  , BMI Body mass index is 47.11 kg/m. GEN: Well nourished, well developed, in no acute distress, obese HEENT: normal Neck: no JVD, carotid bruits, or masses Cardiac: RRR; no murmurs, rubs, or gallops,no edema  Respiratory:  Clear to auscultation bilaterally, normal work of breathing GI: soft, nontender, nondistended, + BS MS:  no deformity or atrophy Skin: warm and dry, no rash Neuro:  Strength and sensation are intact Psych: euthymic mood, full affect   EKG: Personally reviewed.  Normal sinus rhythm heart rate of 90 bpm.  Recent Labs: 06/25/2019: ALT 61 10/03/2019: B Natriuretic Peptide 21.6; TSH 6.628 10/05/2019: BUN 11; Creatinine, Ser 1.12; Hemoglobin 13.7; Platelets 203; Potassium 3.5; Sodium 138    Lipid Panel    Component Value Date/Time   CHOL 139 10/03/2019 0844   TRIG 161 (H) 10/03/2019 0844   HDL 32 (L) 10/03/2019 0844   CHOLHDL 4.3 10/03/2019 0844   VLDL 32 10/03/2019 0844   LDLCALC 75 10/03/2019 0844   LDLDIRECT 68.0 03/11/2019 1427      Wt Readings from Last 3 Encounters:  10/21/19 (!) 319 lb (144.7 kg)  10/08/19 (!) 320 lb 12.8 oz (145.5 kg)  10/05/19 (!) 316 lb 9.6 oz (143.6 kg)      Other studies Reviewed: Cardiac Cath  Mid LAD lesion is 60% stenosed. FFR was 0.78.  A drug-eluting stent was successfully placed using a SYNERGY XD 3.0X20.  Post intervention, there is a 0% residual stenosis.  The left ventricular systolic function is normal.  LV end diastolic pressure is normal. LVEDP 14 mm Hg.  The left ventricular ejection fraction is 55-65% by visual estimate.  There is no aortic valve stenosis.   Single vessel CAD.  Continue aspirin and Brilinta for 12 months.  If Brilinta is too expensive, could switch  to clopidogrel after 1 month.    ASSESSMENT AND PLAN:  1.  CAD: Status post cardiac catheterization revealing mid LAD lesion of 60% with FFR of 0.78.  He is status post DES to the LAD reducing the stenosis to 0%.  The patient continues on dual antiplatelet therapy with Brilinta and aspirin.  He denies any bleeding or recurrent chest discomfort.  He is becoming more active riding his bicycle at home which is a stationary bicycle for 30 minutes.  He offers no complaints of chest pain or significant shortness of breath.  He is given prescription refills on Brilinta, losartan, and atorvastatin.  I have reviewed cardiac catheterization illustration his medications and answered multiple questions concerning his procedure  2.  Hyperlipidemia: LDL goal of less than 70.  LDL was 78 during recent hospitalization.  We will repeat lipids and LFTs in 3 months.  This has been explained to him and he verbalizes understanding.  Continue high-dose atorvastatin 80 mg daily.  3.  Hypertension: Blood pressure is well controlled today.  Slightly elevated diastolic blood pressure.  He will continue losartan with questionable need to add HCTZ if diastolic blood pressure remains elevated.  He will follow-up in 3 months in the office for ongoing evaluation.  BMET will be ordered prior to his being seen.  He is also scheduled to have a home sleep study test on December 09, 2019 and will follow up with Dr. Golden Hurter with sleep medicine.  4.  Non-insulin-dependent diabetes: He is encouraged to follow-up with his PCP, continue exercise and weight loss to better control his diabetes and for overall improvement in health status.   Current medicines are reviewed at length with the patient today.  I have spent 35 minutes dedicated to the care of this patient on the date of this encounter to include pre-visit review of records, assessment, management and diagnostic testing,with shared decision making.  Labs/ tests ordered today  include: CBC,BMET, and Lipids and LFT's   Phill Myron. West Pugh, ANP, AACC  10/21/2019 1:11 PM    [address] Office [phone] Fax [phone]  Notice: This dictation was prepared with Dragon dictation along with smaller phrase technology. Any transcriptional errors that result from this process are unintentional and may not be corrected upon review.

## 2019-10-21 NOTE — Patient Instructions (Signed)
Medication Instructions:  Continue current medications  *If you need a refill on your cardiac medications before your next appointment, please call your pharmacy*  Lab Work: Fasting Lipid and Liver, CBC and BMP in 3 Month  If you have labs (blood work) drawn today and your tests are completely normal, you will receive your results only by: [name] MyChart Message (if you have MyChart) OR . A paper copy in the mail If you have any lab test that is abnormal or we need to change your treatment, we will call you to review the results.  Testing/Procedures: None Ordered  Follow-Up: At [facility], you and your health needs are our priority.  As part of our continuing mission to provide you with exceptional heart care, we have created designated Provider Care Teams.  These Care Teams include your primary Cardiologist (physician) and Advanced Practice Providers (APPs -  Physician Assistants and Nurse Practitioners) who all work together to provide you with the care you need, when you need it.  Your next appointment:   3 month(s)  The format for your next appointment:   In Person  Provider:   You may see Minus Breeding, MD or one of the following Advanced Practice Providers on your designated Care Team:    Rosaria Ferries, PA-C  Jory Sims, DNP, ANP  Cadence Kathlen Mody, NP

## 2019-10-26 ENCOUNTER — Telehealth (HOSPITAL_COMMUNITY): Payer: Self-pay

## 2019-10-26 NOTE — Telephone Encounter (Signed)
Pt is interested in the virtual cardiac rehab program and will come in for orientation on 11/02/2019@9 :30am and will also be setup on VCR.

## 2019-10-29 ENCOUNTER — Telehealth (HOSPITAL_COMMUNITY): Payer: Self-pay | Admitting: Pharmacist

## 2019-10-29 NOTE — Telephone Encounter (Addendum)
Cardiac Rehab Medication Review by a Pharmacist  Does the patient  feel that his/her medications are working for him/her? Patient feels like he is taking too much diabetes medicine. Spoke about diet and continuing exercise if the goal is to try to get off of insulin.  Has the patient been experiencing any side effects to the medications prescribed?  Yes Experiencing shortness of breath with Brilinta  Does the patient measure his/her own blood pressure or blood glucose at home?  yes BP is usually 140/82, which is higher than usual. Most BP readings the patient is used to is in 130s. Morning fasting blood glucose readings are ~140-150. Pre-lunch, sugar is ~130. Sugars are sometimes around 180-220 in the evenings.  Does the patient have any problems obtaining medications due to transportation or finances?   Yes, sometimes financially it is difficult to obtain medications, especially with patient care taking for his father. He has had to get off of Tujeo due to this reason. Currently patient has access to all medications that patient is taking   Understanding of regimen: excellent Understanding of indications: good Potential of compliance: good Pt does not miss doses of his medications.  Pharmacist Comments: Patient's glucose levels are elevated in the evening. This is likely due to Novolin N, which has a duration of 6-12 hours. I would recommend either changing the Novolin N dosing to BID or change to lantus.   Pt was taking claritin D for swallowing difficulty. Instructed patient to stop taking this given heart history and the lack of data for using this medication for swallowing. Likely contributed to elevated blood pressure.  We spoke about the importance of taking Brilinta everyday, especially given recent stent placement. Educated the patient that shortness of breath side effect tends to go away over time. Patient understood and agreed to continue Brilinta.  Sherren Kerns, PharmD PGY1 Acute Care  Pharmacy Resident 10/29/2019 5:43 PM

## 2019-11-01 ENCOUNTER — Telehealth: Payer: Self-pay | Admitting: Family Medicine

## 2019-11-01 NOTE — Telephone Encounter (Signed)
I am okay with refill-plan was for 6-week repeat of labs from last visit-looks like he has a visit scheduled in mid March

## 2019-11-01 NOTE — Telephone Encounter (Signed)
MEDICATION: levothyroxine (synthroid) 125 MCG tablet  PHARMACY: [place], [place]  Comments: Pt states his dosage was increased during hospital stay. Requesting refill.   **Let patient know to contact pharmacy at the end of the day to make sure medication is ready. **  ** Please notify patient to allow 48-72 hours to process**  **Encourage patient to contact the pharmacy for refills or they can request refills through [facility]**

## 2019-11-01 NOTE — Telephone Encounter (Signed)
Ok to refill increased dose or do you want to check lab work prior?

## 2019-11-02 ENCOUNTER — Encounter (HOSPITAL_COMMUNITY)
Admission: RE | Admit: 2019-11-02 | Discharge: 2019-11-02 | Disposition: A | Discharge status: Home / Self Care | Payer: Self-pay | Source: Ambulatory Visit | Attending: Cardiology | Admitting: Cardiology

## 2019-11-02 ENCOUNTER — Other Ambulatory Visit: Payer: Self-pay

## 2019-11-02 VITALS — BP 132/72 | HR 89 | Temp 96.1°F | Ht 68.0 in | Wt 318.8 lb

## 2019-11-02 DIAGNOSIS — Z955 Presence of coronary angioplasty implant and graft: Secondary | ICD-10-CM

## 2019-11-02 MED ORDER — LEVOTHYROXINE SODIUM 125 MCG PO TABS: 125.0000 ug | ORAL_TABLET | Freq: Every day | ORAL | 0 refills | Status: DC

## 2019-11-02 NOTE — Progress Notes (Signed)
Cardiac Individual Treatment Plan  Patient Details  Name: Gabriel [name] MRN: [id number] Date of Birth: [birth year] Referring Provider:     [place] from 11/02/2019 in [place]  Referring Provider  Minus Breeding, MD      Initial Encounter Date:    CARDIAC REHAB PHASE II ORIENTATION from 11/02/2019 in [place]  Date  11/02/19      Visit Diagnosis: S/P coronary artery stent placement  Patient's Home Medications on Admission:  Current Outpatient Medications:  .  acetaminophen (TYLENOL) 500 MG tablet, Take 1,000 mg by mouth every 6 (six) hours as needed for mild pain or moderate pain. Reported on 01/09/2016, Disp: , Rfl:  .  aspirin EC 81 MG EC tablet, Take 1 tablet (81 mg total) by mouth daily., Disp: , Rfl:  .  atorvastatin (LIPITOR) 80 MG tablet, Take 1 tablet (80 mg total) by mouth daily at 6 PM., Disp: 90 tablet, Rfl: 3 .  glimepiride (AMARYL) 4 MG tablet, TAKE 2 TABLET BY MOUTH ONCE DAILY BEFORE  BREAKFAST, Disp: 180 tablet, Rfl: 3 .  glucose blood (ONE TOUCH TEST STRIPS) test strip, Up to 4 times daily.  Onetouch Verio.  E11.65, Disp: 100 each, Rfl: 12 .  insulin NPH Human (NOVOLIN N) 100 UNIT/ML injection, Inject 0.05-0.15 mLs (5-15 Units total) into the skin daily before breakfast. (Patient taking differently: Inject 5 Units into the skin daily before breakfast. ), Disp: 10 mL, Rfl: 11 .  Lancets (ONETOUCH ULTRASOFT) lancets, Up to 4 times daily.  Onetouch Verio.  DxE11.65, Disp: 100 each, Rfl: 12 .  levothyroxine (SYNTHROID) 125 MCG tablet, Take 1 tablet (125 mcg total) by mouth daily before breakfast., Disp: 30 tablet, Rfl: 0 .  losartan (COZAAR) 100 MG tablet, Take 1 tablet (100 mg total) by mouth daily., Disp: 90 tablet, Rfl: 3 .  metFORMIN (GLUCOPHAGE) 1000 MG tablet, Take 1 tablet (1,000 mg total) by mouth 2 (two) times daily with a meal., Disp: 180 tablet, Rfl: 3 .  Multiple  Vitamins-Minerals (MULTIVITAMIN,TX-MINERALS) tablet, Take 1 tablet by mouth daily.  , Disp: , Rfl:  .  nitroGLYCERIN (NITROSTAT) 0.4 MG SL tablet, Place 1 tablet (0.4 mg total) under the tongue every 5 (five) minutes as needed for chest pain (CP or SOB)., Disp: 25 tablet, Rfl: 3 .  Omega-3 Fatty Acids (FISH OIL) 1000 MG CAPS, Take 1,000 mg by mouth every other day. , Disp: , Rfl:  .  omeprazole (PRILOSEC) 20 MG capsule, Take 1 capsule (20 mg total) by mouth daily., Disp: 30 capsule, Rfl: 3 .  ticagrelor (BRILINTA) 90 MG TABS tablet, Take 1 tablet (90 mg total) by mouth 2 (two) times daily., Disp: 180 tablet, Rfl: 3  Past Medical History: Past Medical History:  Diagnosis Date  . Arthritis    "joints; from where I've had surgeries" (08/17/2013)  . Chest pain    a. 08/2013 [facility] CTA: Ca score 238 (95%), LM nl, LAD mod stenosis in D2 area, D1 mild ost stenosis, D2 no signif dzs, LCX small, RCA nl.  . Coronary artery disease    [facility] (08/18/13):  pLAD 50, CFX and RCA normal.  EF 55-65%.  LAD lesion FFR:  0.88 (normal).  Med rx recommended.    [name] Dysphagia    Upper endoscopy 11/2013 - without obvious stricture s/p Maloney dilation.   [name] GERD (gastroesophageal reflux disease)   . Hyperlipidemia    a. Dx 3y ago -  not on statin.  [name] Hypertension    a. Dx 3y ago.  [name] Hypothyroidism    a. on replacement.  . IBS (irritable bowel syndrome)    colonoscopy 2000  . Obesity   . Sleep apnea    does not use CPAP  . Snoring   . Type II diabetes mellitus (HCC)     Tobacco Use: Social History   Tobacco Use  Smoking Status Former Smoker  . Packs/day: 1.50  . Years: 24.00  . Pack years: 36.00  . Types: Cigarettes  . Quit date: 09/10/2007  . Years since quitting: 12.1  Smokeless Tobacco Never Used    Labs: Recent Chemical engineer    Labs for ITP Cardiac and Pulmonary Rehab Latest Ref Rng & Units 03/25/2018 07/06/2018 03/11/2019 06/25/2019 10/03/2019   Cholestrol 0 - 200 mg/dL - 129 - - 139    LDLCALC 0 - 99 mg/dL - 65 - - 75   LDLDIRECT mg/dL - - 68.0 - -   HDL >40 mg/dL - 36.10(L) - - 32(L)   Trlycerides <150 mg/dL - 140.0 - - 161(H)   Hemoglobin A1c 4.8 - 5.6 % 6.7(A) 6.7(H) 6.8(H) 7.3(H) 8.1(H)      Capillary Blood Glucose: Lab Results  Component Value Date   GLUCAP 149 (H) 10/05/2019   GLUCAP 162 (H) 10/04/2019   GLUCAP 170 (H) 10/04/2019   GLUCAP 177 (H) 10/04/2019   GLUCAP 173 (H) 10/04/2019     Exercise Target Goals: Exercise Program Goal: Individual exercise prescription set using results from initial 6 min walk test and THRR while considering  patient's activity barriers and safety.   Exercise Prescription Goal: Initial exercise prescription builds to 30-45 minutes a day of aerobic activity, 2-3 days per week.  Home exercise guidelines will be given to patient during program as part of exercise prescription that the participant will acknowledge.  Activity Barriers & Risk Stratification: Activity Barriers & Cardiac Risk Stratification - 11/02/19 1012      Activity Barriers & Cardiac Risk Stratification   Activity Barriers  Other (comment)    Comments  Hole in right ankle, right arthroscopic knee surgery x2, bulging disc in back, right rotator cuff surgery.    Cardiac Risk Stratification  Moderate       6 Minute Walk: 6 Minute Walk    Row Name 11/02/19 1012         6 Minute Walk   Phase  Initial     Distance  1611 feet     Walk Time  6 minutes     # of Rest Breaks  0     MPH  3.05     METS  3.74     RPE  11     Perceived Dyspnea   0     VO2 Peak  13.09     Symptoms  No     Resting HR  89 bpm     Resting BP  132/72     Resting Oxygen Saturation   96 %     Exercise Oxygen Saturation  during 6 min walk  97 %     Max Ex. HR  122 bpm     Max Ex. BP  168/60     2 Minute Post BP  128/58        Oxygen Initial Assessment:   Oxygen Re-Evaluation:   Oxygen Discharge (Final Oxygen Re-Evaluation):   Initial Exercise Prescription: Initial  Exercise Prescription - 11/02/19 1200  Date of Initial Exercise RX and Referring Provider   Date  11/02/19    Referring Provider  Minus Breeding, MD      Recumbant Bike   Minutes  30      Prescription Details   Frequency (times per week)  5-7    Duration  Progress to 30 minutes of continuous aerobic without signs/symptoms of physical distress      Intensity   THRR 40-80% of Max Heartrate  67-134    Ratings of Perceived Exertion  11-13    Perceived Dyspnea  0-4      Progression   Progression  Continue to progress workloads to maintain intensity without signs/symptoms of physical distress.      Resistance Training   Training Prescription  Yes    Weight  --   blue bands   Reps  10-15       Perform Capillary Blood Glucose checks as needed.  Exercise Prescription Changes:   Exercise Comments:  Exercise Comments    Row Name 11/02/19 1012           Exercise Comments  Reviewed home exercise guidelines with patient.          Exercise Goals and Review:  Exercise Goals    Row Name 11/02/19 1028             Exercise Goals   Increase Physical Activity  Yes       Intervention  Provide advice, education, support and counseling about physical activity/exercise needs.;Develop an individualized exercise prescription for aerobic and resistive training based on initial evaluation findings, risk stratification, comorbidities and participant's personal goals.       Expected Outcomes  Short Term: Attend rehab on a regular basis to increase amount of physical activity.;Long Term: Exercising regularly at least 3-5 days a week.;Long Term: Add in home exercise to make exercise part of routine and to increase amount of physical activity.       Increase Strength and Stamina  Yes       Intervention  Provide advice, education, support and counseling about physical activity/exercise needs.;Develop an individualized exercise prescription for aerobic and resistive training based on  initial evaluation findings, risk stratification, comorbidities and participant's personal goals.       Expected Outcomes  Short Term: Increase workloads from initial exercise prescription for resistance, speed, and METs.;Short Term: Perform resistance training exercises routinely during rehab and add in resistance training at home;Long Term: Improve cardiorespiratory fitness, muscular endurance and strength as measured by increased METs and functional capacity (6MWT)       Able to understand and use rate of perceived exertion (RPE) scale  Yes       Intervention  Provide education and explanation on how to use RPE scale       Expected Outcomes  Short Term: Able to use RPE daily in rehab to express subjective intensity level;Long Term:  Able to use RPE to guide intensity level when exercising independently       Knowledge and understanding of Target Heart Rate Range (THRR)  Yes       Intervention  Provide education and explanation of THRR including how the numbers were predicted and where they are located for reference       Expected Outcomes  Short Term: Able to state/look up THRR;Long Term: Able to use THRR to govern intensity when exercising independently;Short Term: Able to use daily as guideline for intensity in rehab       Able to check  pulse independently  Yes       Intervention  Provide education and demonstration on how to check pulse in carotid and radial arteries.;Review the importance of being able to check your own pulse for safety during independent exercise       Expected Outcomes  Short Term: Able to explain why pulse checking is important during independent exercise;Long Term: Able to check pulse independently and accurately       Understanding of Exercise Prescription  Yes       Intervention  Provide education, explanation, and written materials on patient's individual exercise prescription       Expected Outcomes  Short Term: Able to explain program exercise prescription;Long Term:  Able to explain home exercise prescription to exercise independently          Exercise Goals Re-Evaluation :   Discharge Exercise Prescription (Final Exercise Prescription Changes):   Nutrition:  Target Goals: Understanding of nutrition guidelines, daily intake of sodium 1500mg , cholesterol 200mg , calories 30% from fat and 7% or less from saturated fats, daily to have 5 or more servings of fruits and vegetables.  Biometrics: Pre Biometrics - 11/02/19 1012      Pre Biometrics   Height  5\' 8"  (1.727 m)    Weight  (!) 144.6 kg    Waist Circumference  50 inches    Hip Circumference  56 inches    Waist to Hip Ratio  0.89 %    BMI (Calculated)  48.48    Triceps Skinfold  51 mm    % Body Fat  44.9 %    Grip Strength  51.5 kg    Flexibility  0 in    Single Leg Stand  21.12 seconds        Nutrition Therapy Plan and Nutrition Goals:   Nutrition Assessments:   Nutrition Goals Re-Evaluation:   Nutrition Goals Re-Evaluation:   Nutrition Goals Discharge (Final Nutrition Goals Re-Evaluation):   Psychosocial: Target Goals: Acknowledge presence or absence of significant depression and/or stress, maximize coping skills, provide positive support system. Participant is able to verbalize types and ability to use techniques and skills needed for reducing stress and depression.  Initial Review & Psychosocial Screening: Initial Psych Review & Screening - 11/02/19 1243      Initial Review   Current issues with  Current Stress Concerns    Source of Stress Concerns  Family    Comments  Lamark is the sole caregiver for his father.      Family Dynamics   Good Support System?  No    Strains  Intra-family strains;Illness and family care strain    Concerns  Inappropriate over/under dependence on family/friends    Comments  Cai is the sole caretaker for his fther.  He has a borther who he states does not help.  His aunt is willing to help but Agasthya does not want to bother her to help.       Barriers   Psychosocial barriers to participate in program  There are no identifiable barriers or psychosocial needs.;The patient should benefit from training in stress management and relaxation.      Screening Interventions   Interventions  Encouraged to exercise;To provide support and resources with identified psychosocial needs       Quality of Life Scores: Quality of Life - 11/02/19 1242      Quality of Life   Select  Quality of Life      Quality of Life Scores   Health/Function Pre  24.73 %    Socioeconomic Pre  20.86 %    Psych/Spiritual Pre  24.86 %    Family Pre  21 %    GLOBAL Pre  23.48 %      Scores of 19 and below usually indicate a poorer quality of life in these areas.  A difference of  2-3 points is a clinically meaningful difference.  A difference of 2-3 points in the total score of the Quality of Life Index has been associated with significant improvement in overall quality of life, self-image, physical symptoms, and general health in studies assessing change in quality of life.  PHQ-9: Recent Review Flowsheet Data    Depression screen [facility] 2/9 11/02/2019 08/10/2019 03/09/2019 06/25/2018   Decreased Interest 0 0 0 0   Down, Depressed, Hopeless 0 0 0 0   PHQ - 2 Score 0 0 0 0     Interpretation of Total Score  Total Score Depression Severity:  1-4 = Minimal depression, 5-9 = Mild depression, 10-14 = Moderate depression, 15-19 = Moderately severe depression, 20-27 = Severe depression   Psychosocial Evaluation and Intervention:   Psychosocial Re-Evaluation:   Psychosocial Discharge (Final Psychosocial Re-Evaluation):   Vocational Rehabilitation: Provide vocational rehab assistance to qualifying candidates.   Vocational Rehab Evaluation & Intervention: Vocational Rehab - 11/02/19 1426      Initial Vocational Rehab Evaluation & Intervention   Assessment shows need for Vocational Rehabilitation  No       Education: Education Goals: Education  classes will be provided on a weekly basis, covering required topics. Participant will state understanding/return demonstration of topics presented.  Learning Barriers/Preferences:   Education Topics: Count Your Pulse:  -Group instruction provided by verbal instruction, demonstration, patient participation and written materials to support subject.  Instructors address importance of being able to find your pulse and how to count your pulse when at home without a heart monitor.  Patients get hands on experience counting their pulse with staff help and individually.   Heart Attack, Angina, and Risk Factor Modification:  -Group instruction provided by verbal instruction, video, and written materials to support subject.  Instructors address signs and symptoms of angina and heart attacks.    Also discuss risk factors for heart disease and how to make changes to improve heart health risk factors.   Functional Fitness:  -Group instruction provided by verbal instruction, demonstration, patient participation, and written materials to support subject.  Instructors address safety measures for doing things around the house.  Discuss how to get up and down off the floor, how to pick things up properly, how to safely get out of a chair without assistance, and balance training.   Meditation and Mindfulness:  -Group instruction provided by verbal instruction, patient participation, and written materials to support subject.  Instructor addresses importance of mindfulness and meditation practice to help reduce stress and improve awareness.  Instructor also leads participants through a meditation exercise.    Stretching for Flexibility and Mobility:  -Group instruction provided by verbal instruction, patient participation, and written materials to support subject.  Instructors lead participants through series of stretches that are designed to increase flexibility thus improving mobility.  These stretches are  additional exercise for major muscle groups that are typically performed during regular warm up and cool down.   Hands Only CPR:  -Group verbal, video, and participation provides a basic overview of AHA guidelines for community CPR. Role-play of emergencies allow participants the opportunity to practice calling for help and chest compression  technique with discussion of AED use.   Hypertension: -Group verbal and written instruction that provides a basic overview of hypertension including the most recent diagnostic guidelines, risk factor reduction with self-care instructions and medication management.    Nutrition I class: Heart Healthy Eating:  -Group instruction provided by PowerPoint slides, verbal discussion, and written materials to support subject matter. The instructor gives an explanation and review of the Therapeutic Lifestyle Changes diet recommendations, which includes a discussion on lipid goals, dietary fat, sodium, fiber, plant stanol/sterol esters, sugar, and the components of a well-balanced, healthy diet.   Nutrition II class: Lifestyle Skills:  -Group instruction provided by PowerPoint slides, verbal discussion, and written materials to support subject matter. The instructor gives an explanation and review of label reading, grocery shopping for heart health, heart healthy recipe modifications, and ways to make healthier choices when eating out.   Diabetes Question & Answer:  -Group instruction provided by PowerPoint slides, verbal discussion, and written materials to support subject matter. The instructor gives an explanation and review of diabetes co-morbidities, pre- and post-prandial blood glucose goals, pre-exercise blood glucose goals, signs, symptoms, and treatment of hypoglycemia and hyperglycemia, and foot care basics.   Diabetes Blitz:  -Group instruction provided by PowerPoint slides, verbal discussion, and written materials to support subject matter. The  instructor gives an explanation and review of the physiology behind type 1 and type 2 diabetes, diabetes medications and rational behind using different medications, pre- and post-prandial blood glucose recommendations and Hemoglobin A1c goals, diabetes diet, and exercise including blood glucose guidelines for exercising safely.    Portion Distortion:  -Group instruction provided by PowerPoint slides, verbal discussion, written materials, and food models to support subject matter. The instructor gives an explanation of serving size versus portion size, changes in portions sizes over the last 20 years, and what consists of a serving from each food group.   Stress Management:  -Group instruction provided by verbal instruction, video, and written materials to support subject matter.  Instructors review role of stress in heart disease and how to cope with stress positively.     Exercising on Your Own:  -Group instruction provided by verbal instruction, power point, and written materials to support subject.  Instructors discuss benefits of exercise, components of exercise, frequency and intensity of exercise, and end points for exercise.  Also discuss use of nitroglycerin and activating EMS.  Review options of places to exercise outside of rehab.  Review guidelines for sex with heart disease.   Cardiac Drugs I:  -Group instruction provided by verbal instruction and written materials to support subject.  Instructor reviews cardiac drug classes: antiplatelets, anticoagulants, beta blockers, and statins.  Instructor discusses reasons, side effects, and lifestyle considerations for each drug class.   Cardiac Drugs II:  -Group instruction provided by verbal instruction and written materials to support subject.  Instructor reviews cardiac drug classes: angiotensin converting enzyme inhibitors (ACE-I), angiotensin II receptor blockers (ARBs), nitrates, and calcium channel blockers.  Instructor discusses  reasons, side effects, and lifestyle considerations for each drug class.   Anatomy and Physiology of the Circulatory System:  Group verbal and written instruction and models provide basic cardiac anatomy and physiology, with the coronary electrical and arterial systems. Review of: AMI, Angina, Valve disease, Heart Failure, Peripheral Artery Disease, Cardiac Arrhythmia, Pacemakers, and the ICD.   Other Education:  -Group or individual verbal, written, or video instructions that support the educational goals of the cardiac rehab program.   Holiday Eating Survival Tips:  -  Group instruction provided by PowerPoint slides, verbal discussion, and written materials to support subject matter. The instructor gives patients tips, tricks, and techniques to help them not only survive but enjoy the holidays despite the onslaught of food that accompanies the holidays.   Knowledge Questionnaire Score: Knowledge Questionnaire Score - 11/02/19 1244      Knowledge Questionnaire Score   Pre Score  23/24       Core Components/Risk Factors/Patient Goals at Admission: Personal Goals and Risk Factors at Admission - 11/02/19 1220      Core Components/Risk Factors/Patient Goals on Admission    Weight Management  Yes;Obesity;Weight Loss    Intervention  Weight Management/Obesity: Establish reasonable short term and long term weight goals.;Obesity: Provide education and appropriate resources to help participant work on and attain dietary goals.    Admit Weight  318 lb 12.6 oz (144.6 kg)    Goal Weight: Short Term  312 lb (141.5 kg)    Goal Weight: Long Term  220 lb (99.8 kg)    Expected Outcomes  Short Term: Continue to assess and modify interventions until short term weight is achieved;Long Term: Adherence to nutrition and physical activity/exercise program aimed toward attainment of established weight goal;Weight Loss: Understanding of general recommendations for a balanced deficit meal plan, which promotes  1-2 lb weight loss per week and includes a negative energy balance of [phone] kcal/d;Understanding recommendations for meals to include 15-35% energy as protein, 25-35% energy from fat, 35-60% energy from carbohydrates, less than 200mg  of dietary cholesterol, 20-35 gm of total fiber daily;Understanding of distribution of calorie intake throughout the day with the consumption of 4-5 meals/snacks    Diabetes  Yes    Intervention  Provide education about signs/symptoms and action to take for hypo/hyperglycemia.;Provide education about proper nutrition, including hydration, and aerobic/resistive exercise prescription along with prescribed medications to achieve blood glucose in normal ranges: Fasting glucose 65-99 mg/dL    Expected Outcomes  Short Term: Participant verbalizes understanding of the signs/symptoms and immediate care of hyper/hypoglycemia, proper foot care and importance of medication, aerobic/resistive exercise and nutrition plan for blood glucose control.;Long Term: Attainment of HbA1C < 7%.    Hypertension  Yes    Intervention  Provide education on lifestyle modifcations including regular physical activity/exercise, weight management, moderate sodium restriction and increased consumption of fresh fruit, vegetables, and low fat dairy, alcohol moderation, and smoking cessation.;Monitor prescription use compliance.    Expected Outcomes  Short Term: Continued assessment and intervention until BP is < 140/48mm HG in hypertensive participants. < 130/11mm HG in hypertensive participants with diabetes, heart failure or chronic kidney disease.;Long Term: Maintenance of blood pressure at goal levels.    Lipids  Yes    Intervention  Provide education and support for participant on nutrition & aerobic/resistive exercise along with prescribed medications to achieve LDL 70mg , HDL >40mg .    Expected Outcomes  Short Term: Participant states understanding of desired cholesterol values and is compliant with  medications prescribed. Participant is following exercise prescription and nutrition guidelines.;Long Term: Cholesterol controlled with medications as prescribed, with individualized exercise RX and with personalized nutrition plan. Value goals: LDL < 70mg , HDL > 40 mg.       Core Components/Risk Factors/Patient Goals Review:    Core Components/Risk Factors/Patient Goals at Discharge (Final Review):    ITP Comments: ITP Comments    Row Name 11/02/19 1028           ITP Comments  Dr. Fransico Him, Medical Director  Comments: Patient attended orientation on 11/02/2019 to review rules and guidelines for program.  Completed 6 minute walk test, Intitial ITP, and exercise prescription.  VSS. Telemetry-SR.  Asymptomatic. Safety measures and social distancing in place per CDC guidelines.

## 2019-11-02 NOTE — Progress Notes (Signed)
.        Confirm Consent - In the setting of the current Covid19 crisis, you are scheduled for a phone visit with your Cardiac or Pulmonary team member.  Just as we do with many in-gym visits, in order for you to participate in this visit, we must obtain consent.  If you'd like, I can send this to your mychart (if signed up) or email for you to review.  Otherwise, I can obtain your verbal consent now.  By agreeing to a telephone visit, we'd like you to understand that the technology does not allow for your Cardiac or Pulmonary Rehab team member to perform a physical assessment, and thus may limit their ability to fully assess your ability to perform exercise programs. If your provider identifies any concerns that need to be evaluated in person, we will make arrangements to do so.  Finally, though the technology is pretty good, we cannot assure that it will always work on either your or our end and we cannot ensure that we have a secure connection.  Cardiac and Pulmonary Rehab Telehealth visits and "At Home" cardiac and pulmonary rehab are provided at no cost to you.        Are you willing to proceed?"        STAFF: Did the patient verbally acknowledge consent to telehealth visit? Document YES/NO here: Yes     Noel Christmas   Cardiac and Pulmonary Rehab Staff      Date 11/02/2019      Time 12:15 PM      .

## 2019-11-02 NOTE — Progress Notes (Signed)
Spoke to pt regarding Virtual Cardiac  and Pulmonary Rehab.  Pt  was able to download the [employer] app on their smart device with no issues. Pt set up their account and received the following welcome message -"Welcome to the [facility] program. We hope that you will find the exercise program beneficial in your recovery process. Our staff is available to assist with any questions/concerns about your exercise routine. Best wishes". Brief orientation provided to with the advisement to watch the "Intro to Rehab" series located under the Resource tab. Pt verbalized understanding. Will continue to follow and monitor pt progress with feedback as needed.  

## 2019-11-02 NOTE — Progress Notes (Signed)
Reviewed home exercise guidelines with patient including endpoints, temperature precautions, target heart rate and rate of perceived exertion. Pt is rising his recumbent bike at home 15 minutes twice everyday as his mode of home exercise. Pt also uses a 10 lb. Medicine ball doing an overhead lift, 3 sets of 10 repetitions. Patient given a blue exercise band for resistance training. Patient will participate in the virtual cardiac rehab program via the [employer] app. Pt voices understanding of instructions given.  Sol Passer, MS, ACSM [facility] 11/02/2019 [phone]

## 2019-11-02 NOTE — Telephone Encounter (Signed)
Refill sent in

## 2019-11-05 ENCOUNTER — Other Ambulatory Visit: Payer: Self-pay

## 2019-11-05 ENCOUNTER — Inpatient Hospital Stay (HOSPITAL_COMMUNITY)
Admission: RE | Admit: 2019-11-05 | Discharge: 2019-11-05 | Disposition: A | Discharge status: Home / Self Care | Payer: Self-pay | Source: Ambulatory Visit

## 2019-11-05 NOTE — Progress Notes (Signed)
Virtual Cardiac Rehab Nutrition Note  Spoke with pt to schedule his initial nutrition assessment. Scheduled for March 5th at 11 am. He had no questions during this call. Will continue to follow up with pt during his time with virtual cardiac rehab.  Michaele Offer, MS, RDN, LDN

## 2019-11-10 ENCOUNTER — Telehealth: Payer: Self-pay | Admitting: Family Medicine

## 2019-11-10 ENCOUNTER — Encounter (HOSPITAL_COMMUNITY)
Admission: RE | Admit: 2019-11-10 | Discharge: 2019-11-10 | Disposition: A | Discharge status: Home / Self Care | Payer: Self-pay | Source: Ambulatory Visit | Attending: Cardiology | Admitting: Cardiology

## 2019-11-10 DIAGNOSIS — Z955 Presence of coronary angioplasty implant and graft: Secondary | ICD-10-CM | POA: Insufficient documentation

## 2019-11-10 NOTE — Progress Notes (Signed)
Cardiac Rehab: Virtual Visit  Patient participates in the virtual cardiac rehab program via the [employer] app. Patient had some issue logging into the app, and I will contact the [facility] support team regarding this issue.  I spoke with patient today to check progress with exercise. Patient joined a [employer] practitioner gym this past Saturday and his plan is to exercise everyday either at the gym or on his recumbent bike at home. Patient's goal is to increase his endurance. At the gym he exercised 10 minutes on the treadmill (TM) at 3.0 mph and 3% incline, 10 minutes on the elliptical (EFX) at level 5.0, and 10 minutes on the recumbent bike (RB) at level 4.0 and tolerated well. Patient states he had some "heaviness" in the legs on the EFX, and we discussed decreasing workload to see if that helps. Patient is also using 10 lb. free weights for his resistance training, 3 sets of 10 for each exercise and 10-15 minutes using an exercise ball. Patient states he had no difficulty with this exercise routine. Patient riding his RB when he doesn't go to the gym and using exercise band provided for his resistance training at home. Patient feels like he has more energy and is able to be more active. Eventually patient would like to try some low impact aerobics classes and/or yoga once those classes resume at the gym. Patient would also like to get back to playing softball in the fall.   During the course of our conversation, patient states that he hasn't taken his insulin since Sunday 11/07/2019. Patient states that he wanted to see how his blood sugar responds without insulin since he's started adding more exercise. After conferring with one of the nurse case managers at cardiac rehab, I instructed patient to contact his primary care provider before stopping insulin, and patient is amenable to this. Patient states that he will take his insulin today and will call his PCP to discuss his insulin dose.   Patient  has a telephone  visit scheduled with Michaele Offer, RD on 11/12/19, and I advised him that he can discuss his nutrition goals regarding diabetes and meal planning with her at that time.  Will follow-up with patient next week.  Sol Passer, MS, ACSM [facility] 11/10/19 [phone]

## 2019-11-10 NOTE — Telephone Encounter (Signed)
Insulin levels for the last week: -177 THIS MORNING -155 Wednesday  -180 Tuesday  -205 Monday  -150 Sunday  Patient states that he has joined a gym this past Saturday and has noticed his levels will go down about 60 levels but as soon as he eats something it goes back up to 200 and will come down very slowly, just wondered if Dr.Hunter wants to keep him at the same dosage of insulin.

## 2019-11-10 NOTE — Telephone Encounter (Signed)
See below

## 2019-11-11 NOTE — Telephone Encounter (Signed)
Tried calling pt and he has a vm that has not been set up.

## 2019-11-11 NOTE — Telephone Encounter (Signed)
The numbers listed dont look too bad. He is saying it goes down about 60- does that mean the lowest he is seeing is 90? Im ok continuing same dose as long as no blood sugars under 80

## 2019-11-12 ENCOUNTER — Inpatient Hospital Stay (HOSPITAL_COMMUNITY)
Admission: RE | Admit: 2019-11-12 | Discharge: 2019-11-12 | Disposition: A | Discharge status: Home / Self Care | Payer: Self-pay | Source: Ambulatory Visit

## 2019-11-12 ENCOUNTER — Other Ambulatory Visit: Payer: Self-pay

## 2019-11-12 NOTE — Progress Notes (Signed)
Gabriel [name] 54 y.o. male Nutrition Note  Visit Diagnosis: S/P coronary artery stent placement  Past Medical History:  Diagnosis Date  . Arthritis    "joints; from where I've had surgeries" (08/17/2013)  . Chest pain    a. 08/2013 [facility] CTA: Ca score 238 (95%), LM nl, LAD mod stenosis in D2 area, D1 mild ost stenosis, D2 no signif dzs, LCX small, RCA nl.  . Coronary artery disease    [facility] (08/18/13):  pLAD 50, CFX and RCA normal.  EF 55-65%.  LAD lesion FFR:  0.88 (normal).  Med rx recommended.    [name] Dysphagia    Upper endoscopy 11/2013 - without obvious stricture s/p Maloney dilation.   [name] GERD (gastroesophageal reflux disease)   . Hyperlipidemia    a. Dx 3y ago - not on statin.  [name] Hypertension    a. Dx 3y ago.  [name] Hypothyroidism    a. on replacement.  . IBS (irritable bowel syndrome)    colonoscopy 2000  . Obesity   . Sleep apnea    does not use CPAP  . Snoring   . Type II diabetes mellitus (HCC)      Medications reviewed.   Current Outpatient Medications:  .  acetaminophen (TYLENOL) 500 MG tablet, Take 1,000 mg by mouth every 6 (six) hours as needed for mild pain or moderate pain. Reported on 01/09/2016, Disp: , Rfl:  .  aspirin EC 81 MG EC tablet, Take 1 tablet (81 mg total) by mouth daily., Disp: , Rfl:  .  atorvastatin (LIPITOR) 80 MG tablet, Take 1 tablet (80 mg total) by mouth daily at 6 PM., Disp: 90 tablet, Rfl: 3 .  glimepiride (AMARYL) 4 MG tablet, TAKE 2 TABLET BY MOUTH ONCE DAILY BEFORE  BREAKFAST, Disp: 180 tablet, Rfl: 3 .  glucose blood (ONE TOUCH TEST STRIPS) test strip, Up to 4 times daily.  Onetouch Verio.  E11.65, Disp: 100 each, Rfl: 12 .  insulin NPH Human (NOVOLIN N) 100 UNIT/ML injection, Inject 0.05-0.15 mLs (5-15 Units total) into the skin daily before breakfast. (Patient taking differently: Inject 5 Units into the skin daily before breakfast. ), Disp: 10 mL, Rfl: 11 .  Lancets (ONETOUCH ULTRASOFT) lancets, Up to 4 times daily.  Onetouch Verio.   DxE11.65, Disp: 100 each, Rfl: 12 .  levothyroxine (SYNTHROID) 125 MCG tablet, Take 1 tablet (125 mcg total) by mouth daily before breakfast., Disp: 30 tablet, Rfl: 0 .  losartan (COZAAR) 100 MG tablet, Take 1 tablet (100 mg total) by mouth daily., Disp: 90 tablet, Rfl: 3 .  metFORMIN (GLUCOPHAGE) 1000 MG tablet, Take 1 tablet (1,000 mg total) by mouth 2 (two) times daily with a meal., Disp: 180 tablet, Rfl: 3 .  Multiple Vitamins-Minerals (MULTIVITAMIN,TX-MINERALS) tablet, Take 1 tablet by mouth daily.  , Disp: , Rfl:  .  nitroGLYCERIN (NITROSTAT) 0.4 MG SL tablet, Place 1 tablet (0.4 mg total) under the tongue every 5 (five) minutes as needed for chest pain (CP or SOB)., Disp: 25 tablet, Rfl: 3 .  Omega-3 Fatty Acids (FISH OIL) 1000 MG CAPS, Take 1,000 mg by mouth every other day. , Disp: , Rfl:  .  omeprazole (PRILOSEC) 20 MG capsule, Take 1 capsule (20 mg total) by mouth daily., Disp: 30 capsule, Rfl: 3 .  ticagrelor (BRILINTA) 90 MG TABS tablet, Take 1 tablet (90 mg total) by mouth 2 (two) times daily., Disp: 180 tablet, Rfl: 3   Ht Readings from Last 1 Encounters:  11/02/19 5\' 8"  (  1.727 m)     Wt Readings from Last 3 Encounters:  11/02/19 (!) 318 lb 12.6 oz (144.6 kg)  10/21/19 (!) 319 lb (144.7 kg)  10/08/19 (!) 320 lb 12.8 oz (145.5 kg)     There is no height or weight on file to calculate BMI.   Social History   Tobacco Use  Smoking Status Former Smoker  . Packs/day: 1.50  . Years: 24.00  . Pack years: 36.00  . Types: Cigarettes  . Quit date: 09/10/2007  . Years since quitting: 12.1  Smokeless Tobacco Never Used     Lab Results  Component Value Date   CHOL 139 10/03/2019   Lab Results  Component Value Date   HDL 32 (L) 10/03/2019   Lab Results  Component Value Date   LDLCALC 75 10/03/2019   Lab Results  Component Value Date   TRIG 161 (H) 10/03/2019   Lab Results  Component Value Date   CHOLHDL 4.3 10/03/2019     Lab Results  Component Value Date    HGBA1C 8.1 (H) 10/03/2019     CBG (last 3)  No results for input(s): GLUCAP in the last 72 hours.   Nutrition Note  Spoke with pt. Nutrition Plan and Nutrition Survey goals reviewed with pt. Pt is following a Heart Healthy diet. Pt wants to lose wt. Pt has been trying to lose wt by decreasing portions, going to the gym, and reading labels.. Wt loss tips reviewed (label reading, how to build a healthy plate, portion sizes, eating frequently across the day).  Pt has type II diabetes. Last A1c indicates blood glucose is not well-controlled. Pt checks CBG's 1-3 times a day. Fasting CBG's reportedly 160-180 mg/dL.   We reviewed label reading, carb counting, and monitoring CBGs with goal of fasting CBG 90-130 mg/d. Recommended 60-75 g/carbs per meal. Discussed meal planning and counting carbs at largest meal with goal of moving towards counting carbs at every meal. Discussed Plate Method for ease especially when eating somewhere that labels are not available. Recommended Calorie King app/website for pt to estimate carb count of mixed dishes.  Pt expressed understanding of the information reviewed.    Nutrition Diagnosis Excessive carbohydrate intake related to food preferences and lack of food related knowledge as evidenced by diet recall, A1C 8.0%, fasting CBGs 160-180 mg/dl  Nutrition Intervention ? Pt's individual nutrition plan reviewed with pt. ? Benefits of adopting Heart Healthy diet discussed when Medficts reviewed.   ? Pt given handouts for: ? Carbohydrate counting ? Continue client-centered nutrition education by RD, as part of interdisciplinary care.  Goal(s) ? CBG concentrations in the normal range or as close to normal as is safely possible. ? Improved blood glucose control as evidenced by pt's A1c trending from 8.1 toward less than 7.0.  Plan:   Will provide client-centered nutrition education as part of interdisciplinary care  Monitor and evaluate progress toward nutrition  goal with team.   Michaele Offer, MS, RDN, LDN

## 2019-11-12 NOTE — Telephone Encounter (Signed)
Unable to LMOVM, VM not set up

## 2019-11-12 NOTE — Telephone Encounter (Signed)
Spoke with patient, states that he is not experiencing any readings below 98. Informed patient that PCP is OK with current readings, but to notify if he experiences any readings below 80 continuously.

## 2019-11-13 ENCOUNTER — Other Ambulatory Visit: Payer: Self-pay | Admitting: Family Medicine

## 2019-11-15 NOTE — Telephone Encounter (Signed)
Should Patient be in this medication. Did not see Rx Euthyrox in his med list  Last TSH done last month

## 2019-11-17 LAB — BASIC METABOLIC PANEL
BUN/Creatinine Ratio: 11 (ref 9–20)
BUN: 11 mg/dL (ref 6–24)
CO2: 24 mmol/L (ref 20–29)
Calcium: 9.3 mg/dL (ref 8.7–10.2)
Chloride: 101 mmol/L (ref 96–106)
Creatinine, Ser: 0.99 mg/dL (ref 0.76–1.27)
GFR calc Af Amer: 101 mL/min/{1.73_m2} (ref >59)
GFR calc non Af Amer: 87 mL/min/{1.73_m2} (ref >59)
Glucose: 123 mg/dL — ABNORMAL HIGH (ref 65–99)
Potassium: 4.2 mmol/L (ref 3.5–5.2)
Sodium: 140 mmol/L (ref 134–144)

## 2019-11-17 LAB — LIPID PANEL
Chol/HDL Ratio: 3.5 ratio (ref 0.0–5.0)
Cholesterol, Total: 118 mg/dL (ref 100–199)
HDL: 34 mg/dL — ABNORMAL LOW (ref >39)
LDL Chol Calc (NIH): 64 mg/dL (ref 0–99)
Triglycerides: 110 mg/dL (ref 0–149)
VLDL Cholesterol Cal: 20 mg/dL (ref 5–40)

## 2019-11-17 LAB — CBC
Hematocrit: 42.5 % (ref 37.5–51.0)
Hemoglobin: 13.9 g/dL (ref 13.0–17.7)
MCH: 28.8 pg (ref 26.6–33.0)
MCHC: 32.7 g/dL (ref 31.5–35.7)
MCV: 88 fL (ref 79–97)
Platelets: 245 10*3/uL (ref 150–450)
RBC: 4.82 x10E6/uL (ref 4.14–5.80)
RDW: 13.1 % (ref 11.6–15.4)
WBC: 6.1 10*3/uL (ref 3.4–10.8)

## 2019-11-17 LAB — HEPATIC FUNCTION PANEL
ALT: 48 IU/L — ABNORMAL HIGH (ref 0–44)
AST: 35 IU/L (ref 0–40)
Albumin: 4.4 g/dL (ref 3.8–4.9)
Alkaline Phosphatase: 109 IU/L (ref 39–117)
Bilirubin Total: 1 mg/dL (ref 0.0–1.2)
Bilirubin, Direct: 0.23 mg/dL (ref 0.00–0.40)
Total Protein: 6.7 g/dL (ref 6.0–8.5)

## 2019-11-19 ENCOUNTER — Other Ambulatory Visit: Payer: Self-pay

## 2019-11-19 ENCOUNTER — Inpatient Hospital Stay (HOSPITAL_COMMUNITY)
Admission: RE | Admit: 2019-11-19 | Discharge: 2019-11-19 | Disposition: A | Discharge status: Home / Self Care | Payer: Self-pay | Source: Ambulatory Visit

## 2019-11-19 NOTE — Progress Notes (Signed)
Nutrition Note: Virtual Visit  Spoke with pt for virtual cardiac rehab.  Diet recall reviewed. Greatest success this week was decreasing portion sizes. He reports fasting CBG 145-155 mg/dl and this morning it was 134 mg/dl. Congratulated pt on decreasing it from 160-180 mg/dl reported last week.  He was not able to count carbs last week to determine how much he typically eats in a meal but will try again this week. Pt denies any problems with medications.  Nutrition goals reviewed. Activity: food and CBG log Reinforced education and goals with summary email.  Will continue to monitor pt with weekly phone calls for virtual cardiac rehab.   Michaele Offer, MS, RDN, LDN

## 2019-11-22 ENCOUNTER — Other Ambulatory Visit: Payer: Self-pay

## 2019-11-22 ENCOUNTER — Ambulatory Visit (INDEPENDENT_AMBULATORY_CARE_PROVIDER_SITE_OTHER): Payer: Self-pay | Admitting: Family Medicine

## 2019-11-22 ENCOUNTER — Encounter: Payer: Self-pay | Admitting: Family Medicine

## 2019-11-22 VITALS — BP 134/82 | HR 86 | Temp 97.3°F | Ht 68.0 in | Wt 317.2 lb

## 2019-11-22 DIAGNOSIS — E785 Hyperlipidemia, unspecified: Secondary | ICD-10-CM

## 2019-11-22 DIAGNOSIS — E119 Type 2 diabetes mellitus without complications: Secondary | ICD-10-CM

## 2019-11-22 DIAGNOSIS — I10 Essential (primary) hypertension: Secondary | ICD-10-CM

## 2019-11-22 DIAGNOSIS — E1169 Type 2 diabetes mellitus with other specified complication: Secondary | ICD-10-CM

## 2019-11-22 DIAGNOSIS — E039 Hypothyroidism, unspecified: Secondary | ICD-10-CM

## 2019-11-22 NOTE — Progress Notes (Signed)
Phone [phone] In person visit   Subjective:   Gabriel [name] is a 54 y.o. year old very pleasant male patient who presents for/with See problem oriented charting Chief Complaint  Patient presents with  . Diabetes   This visit occurred during the SARS-CoV-2 public health emergency.  Safety protocols were in place, including screening questions prior to the visit, additional usage of staff PPE, and extensive cleaning of exam room while observing appropriate contact time as indicated for disinfecting solutions.   Past Medical History-  Patient Active Problem List   Diagnosis Date Noted  . Coronary Artery Disease 08/26/2013    Priority: High  . Diabetes mellitus without complication ([place]) [id number]    Priority: High  . 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 Morbid obesity ([place]) 08/10/2013    Priority: Medium  . Essential hypertension 02/06/2011    Priority: Medium  . Hypothyroidism 02/06/2011    Priority: Medium  . Hyperlipidemia associated with type 2 diabetes mellitus ([place]) 02/06/2011    Priority: Medium  . Colon cancer screening 10/27/2018    Priority: Low  . Midepigastric pain 09/27/2018    Priority: Low  . Change in bowel habits 09/27/2018    Priority: Low  . Family hx colonic polyps 09/27/2018    Priority: Low  . BRBPR (bright red blood per rectum) 09/27/2018    Priority: Low  . Left lateral epicondylitis 03/18/2016    Priority: Low  . GERD (gastroesophageal reflux disease) 02/01/2015    Priority: Low  . Dysphagia 09/30/2013    Priority: Low    Medications- reviewed and updated Current Outpatient Medications  Medication Sig Dispense Refill  . acetaminophen (TYLENOL) 500 MG tablet Take 1,000 mg by mouth every 6 (six) hours as needed for mild pain or moderate pain. Reported on 01/09/2016    . aspirin EC 81 MG EC tablet Take 1 tablet (81 mg total) by mouth daily.    [name] atorvastatin (LIPITOR) 80 MG tablet Take 1 tablet (80 mg total) by  mouth daily at 6 PM. 90 tablet 3  . EUTHYROX 125 MCG tablet TAKE 1 TABLET BY MOUTH ONCE DAILY BEFORE BREAKFAST 90 tablet 1  . glimepiride (AMARYL) 4 MG tablet TAKE 2 TABLET BY MOUTH ONCE DAILY BEFORE  BREAKFAST 180 tablet 3  . glucose blood (ONE TOUCH TEST STRIPS) test strip Up to 4 times daily.  Onetouch Verio.  E11.65 100 each 12  . insulin NPH Human (NOVOLIN N) 100 UNIT/ML injection Inject 0.05-0.15 mLs (5-15 Units total) into the skin daily before breakfast. (Patient taking differently: Inject 5 Units into the skin daily before breakfast. ) 10 mL 11  . Lancets (ONETOUCH ULTRASOFT) lancets Up to 4 times daily.  Onetouch Verio.  DxE11.65 100 each 12  . losartan (COZAAR) 100 MG tablet Take 1 tablet (100 mg total) by mouth daily. 90 tablet 3  . metFORMIN (GLUCOPHAGE) 1000 MG tablet Take 1 tablet (1,000 mg total) by mouth 2 (two) times daily with a meal. 180 tablet 3  . Multiple Vitamins-Minerals (MULTIVITAMIN,TX-MINERALS) tablet Take 1 tablet by mouth daily.      . nitroGLYCERIN (NITROSTAT) 0.4 MG SL tablet Place 1 tablet (0.4 mg total) under the tongue every 5 (five) minutes as needed for chest pain (CP or SOB). 25 tablet 3  . Omega-3 Fatty Acids (FISH OIL) 1000 MG CAPS Take 1,000 mg by mouth every other day.     [name] omeprazole (PRILOSEC) 20 MG capsule Take 1 capsule (20  mg total) by mouth daily. 30 capsule 3  . ticagrelor (BRILINTA) 90 MG TABS tablet Take by mouth 2 (two) times daily.     No current facility-administered medications for this visit.     Objective:  BP 134/82   Pulse 86   Temp (!) 97.3 F (36.3 C) (Temporal)   Ht 5\' 8"  (1.727 m)   Wt (!) 317 lb 3.2 oz (143.9 kg)   SpO2 96%   BMI 48.23 kg/m  Gen: NAD, resting comfortably CV: RRR no murmurs rubs or gallops Lungs: CTAB no crackles, wheeze, rhonchi Abdomen: soft/nontender/nondistended/normal bowel sounds. No rebound or guarding.  Ext: no edema Skin: warm, dry    Assessment and Plan  # shortness of Breath  S:Patient has  had continued shortness of breath after starting new medications - mainly in a recliner or laying down.  Is still able to exercise an hour a day (bike/ellipitical/treadmill)- back at the gym over last month A/P: Perception of shortness of breath seem to start around the time of Brilinta-continues in reclined position despite his excellent exercise regimen-I do not suspect this is cardiac in origin-also chest pain-free with exercise.  Patient has a visit within a few weeks with cardiology and I encouraged him to express concern -Lungs were clear today and with no progressive symptoms or issues with exertion we did not opt to do a chest x-ray.  I also think the risk of COVID-19 is low given correlation with starting Brilinta  # Diabetes S: Compliant withmetformin 1000mg  BID, glimepiride mg. Doing 5 units of NPH from [employer] and that seems to be helping some.  CBGs- 130-150s in the AMs. None over 200 even after he eats. Lowest he has seen was 99.  Exercise and diet-  at gym 6 days a week for an hour. Recumbent bike on day gym is closed. Down 3 lbs from last visit- talking with sounds like nutrionist with the heart center.  Lab Results  Component Value Date   HGBA1C 8.1 (H) 10/03/2019   HGBA1C 7.3 (H) 06/25/2019   HGBA1C 6.8 (H) 03/11/2019   A/P: Blood sugar is improving-excellent exercise regimen and improving diet Down 3 pounds from last visit.  We will check an A1c before next visit in 3 months-hopefully well controlled-now continue current medications  #hyperlipidemia S: compliant with atorvastatin 80 mg Lab Results  Component Value Date   CHOL 118 11/16/2019   HDL 34 (L) 11/16/2019   LDLCALC 64 11/16/2019   LDLDIRECT 68.0 03/11/2019   TRIG 110 11/16/2019   CHOLHDL 3.5 11/16/2019   A/P: Excellent control with last cardiology check with LDL under 70-continue current medication   #hypothyroidism S: compliant On thyroid medication-levothyroxine 25 mcg Throat hasnt been sore from last  visit Lab Results  Component Value Date   TSH 6.628 (H) 10/03/2019   A/P: Last TSH was off with patient was hospitalized with chest pain-we will repeat TSH at follow-up  #Health maintenance-patient without insurance and cannot afford a colonoscopy or Cologuard.  We discussed possible fit test but I am not sure of the cost of this.  May be worth simply doing a Hemoccult card while he is in the office.  I did send patient a message as max out-of-pocket cost on Cologuard I believe is around $650 and he may be able to save up for that  Recommended follow up: Return in about 3 months (around 02/22/2020).  With labs before visit Future Appointments  Date Time Provider [place]  12/09/2019 12:00 PM  Sueanne Margarita, MD [facility]  01/17/2020 10:15 AM Lendon Colonel, NP [facility]  02/18/2020 11:00 AM [facility]  02/22/2020  1:40 PM Yong Channel Brayton Mars, MD [facility]    Lab/Order associations:   ICD-10-CM   1. Diabetes mellitus without complication (HCC)  [id number] Hemoglobin A1c    CBC with Differential/Platelet    Comprehensive metabolic panel  2. Hyperlipidemia associated with type 2 diabetes mellitus ([place])  E11.69    E78.5   3. Essential hypertension  I10   4. Hypothyroidism, unspecified type  E03.9 TSH   Return precautions advised.  Garret Reddish, MD

## 2019-11-22 NOTE — Patient Instructions (Addendum)
Health Maintenance Due  Topic Date Due  . OPHTHALMOLOGY EXAM will call to make app  10/26/2016  . COLONOSCOPY - wait on this until you have insurance 04/21/2017   Schedule 3 month follow up with labs a few days before (the labs I ordered today)  Recommended follow up: Return in about 3 months (around 02/22/2020).

## 2019-12-03 ENCOUNTER — Telehealth (HOSPITAL_COMMUNITY): Payer: Self-pay

## 2019-12-03 ENCOUNTER — Encounter (HOSPITAL_COMMUNITY): Payer: Self-pay

## 2019-12-04 ENCOUNTER — Other Ambulatory Visit: Payer: Self-pay | Admitting: Family Medicine

## 2019-12-09 ENCOUNTER — Ambulatory Visit (HOSPITAL_BASED_OUTPATIENT_CLINIC_OR_DEPARTMENT_OTHER): Payer: Self-pay | Attending: Cardiology | Admitting: Cardiology

## 2019-12-09 ENCOUNTER — Other Ambulatory Visit: Payer: Self-pay

## 2019-12-09 DIAGNOSIS — G4730 Sleep apnea, unspecified: Secondary | ICD-10-CM

## 2019-12-09 DIAGNOSIS — G4733 Obstructive sleep apnea (adult) (pediatric): Secondary | ICD-10-CM | POA: Insufficient documentation

## 2019-12-19 NOTE — Procedures (Signed)
    Patient Name: Gabriel [name], Gabriel [name] Date: 12/10/2019 Gender: Male D.O.B: 11/19/1965 Age (years): 53 Referring Provider: Minus Breeding Height (inches): 15 Interpreting Physician: Fransico Him MD, ABSM Weight (lbs): 325 RPSGT: Jacolyn Reedy BMI: 48 MRN: [id number] Neck Size: 18.00  CLINICAL INFORMATION Sleep Study Type: HST  Indication for sleep study: N/A  Epworth Sleepiness Score: 6  SLEEP STUDY TECHNIQUE A multi-channel overnight portable sleep study was performed. The channels recorded were: nasal airflow, thoracic respiratory movement, and oxygen saturation with a pulse oximetry. Snoring was also monitored.  MEDICATIONS Patient self administered medications include: N/A.  SLEEP ARCHITECTURE Patient was studied for 338.5 minutes. The sleep efficiency was 96.9 % and the patient was supine for 34.9%. The arousal index was 0.0 per hour.  RESPIRATORY PARAMETERS The overall AHI was 56.2 per hour, with a central apnea index of 0.0 per hour.  The oxygen nadir was 85% during sleep.  CARDIAC DATA Mean heart rate during sleep was 72.5 bpm.  IMPRESSIONS - Severe obstructive sleep apnea occurred during this study (AHI = 56.2/h). - No significant central sleep apnea occurred during this study (CAI = 0.0/h). - Moderate oxygen desaturation was noted during this study (Min O2 = 85%). - Patient snored 25.1% during the sleep.  DIAGNOSIS - Obstructive Sleep Apnea (327.23 [G47.33 ICD-10])  RECOMMENDATIONS - Recommend CPAP titration  - Avoid alcohol, sedatives and other CNS depressants that may worsen sleep apnea and disrupt normal sleep architecture. - Sleep hygiene should be reviewed to assess factors that may improve sleep quality. - Weight management and regular exercise should be initiated or continued.  [Electronically signed] 12/19/2019 04:45 PM  Fransico Him MD, ABSM Diplomate, American Board of Sleep Medicine

## 2019-12-21 ENCOUNTER — Telehealth: Payer: Self-pay | Admitting: *Deleted

## 2019-12-21 NOTE — Telephone Encounter (Signed)
Informed patient of sleep study results and patient understanding was verbalized. Patient understands his sleep study showed they have sleep apnea and recommend CPAP titration. Please set up titration in the sleep lab.  Patient does not have insurance and would like a call to discuss cost before doing the sleep study.  cpap titration sent to sleep pool

## 2019-12-21 NOTE — Telephone Encounter (Signed)
-----   Message from Gabriel Margarita, MD sent at 12/19/2019  4:47 PM EDT ----- Please let patient know that they have sleep apnea and recommend CPAP titration. Please set up titration in the sleep lab.

## 2020-01-03 ENCOUNTER — Telehealth: Payer: Self-pay | Admitting: *Deleted

## 2020-01-03 DIAGNOSIS — G4730 Sleep apnea, unspecified: Secondary | ICD-10-CM

## 2020-01-03 NOTE — Telephone Encounter (Signed)
Order placed to [facility] to Order ResMed CPAP on auto from 4 to 20cm H2O with heated humidity and mask of choice.   DME selection is choice. Patient understands he will be contacted by [facility] to set up his cpap. Patient understands to call if [facility] does not contact him with new setup in a timely manner. Patient understands they will be called once confirmation has been received from [facility] that they have received their new machine to schedule 10 week follow up appointment.  [facility] notified of new cpap order  Please add to airview Patient was grateful for the call and thanked me.

## 2020-01-03 NOTE — Telephone Encounter (Signed)
-----   Message from Sueanne Margarita, MD sent at 12/31/2019  8:10 PM EDT ----- Regarding: RE: titration or apap Order ResMed CPAP on auto from 4 to 20cm H2O with heated humidity and mask of choice.  Get a download in 2 weeks and followup with me in 8 weeks  Traci Turner ----- Message ----- From: Freada Bergeron, CMA Sent: 12/31/2019   2:47 PM EDT To: Sueanne Margarita, MD Subject: titration or apap                              Patient does not have insurance is APAP ok? ----- Message ----- From: Lauralee Evener, CMA Sent: 12/29/2019   4:48 PM EDT To: Freada Bergeron, CMA Subject: RE: precert                                    He needs to discuss the cost with the sleep lab or have TT do a APAP which is also going to cost.but at least he won't have to pay for a titration study. ----- Message ----- From: Freada Bergeron, CMA Sent: 12/21/2019  10:55 AM EDT To: [facility] Subject: precert                                        Patient Does not have insurance and would like a call to discuss cost Before doing the sleep study.  recommend CPAP titration

## 2020-01-12 ENCOUNTER — Encounter (HOSPITAL_COMMUNITY)
Admission: RE | Admit: 2020-01-12 | Discharge: 2020-01-12 | Disposition: A | Discharge status: Home / Self Care | Payer: Self-pay | Source: Ambulatory Visit | Attending: Cardiology | Admitting: Cardiology

## 2020-01-12 DIAGNOSIS — Z955 Presence of coronary angioplasty implant and graft: Secondary | ICD-10-CM | POA: Insufficient documentation
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
## 2020-01-17 ENCOUNTER — Other Ambulatory Visit: Payer: Self-pay | Admitting: Family Medicine

## 2020-01-17 ENCOUNTER — Other Ambulatory Visit: Payer: Self-pay

## 2020-01-17 ENCOUNTER — Encounter: Payer: Self-pay | Admitting: Adult Health

## 2020-01-17 ENCOUNTER — Ambulatory Visit (INDEPENDENT_AMBULATORY_CARE_PROVIDER_SITE_OTHER): Payer: Self-pay | Admitting: Adult Health

## 2020-01-17 VITALS — BP 118/70 | HR 83 | Temp 97.2°F | Ht 69.0 in | Wt 320.4 lb

## 2020-01-17 DIAGNOSIS — I2510 Atherosclerotic heart disease of native coronary artery without angina pectoris: Secondary | ICD-10-CM

## 2020-01-17 DIAGNOSIS — E7800 Pure hypercholesterolemia, unspecified: Secondary | ICD-10-CM

## 2020-01-17 DIAGNOSIS — G4731 Primary central sleep apnea: Secondary | ICD-10-CM

## 2020-01-17 DIAGNOSIS — G4739 Other sleep apnea: Secondary | ICD-10-CM

## 2020-01-17 DIAGNOSIS — E119 Type 2 diabetes mellitus without complications: Secondary | ICD-10-CM

## 2020-01-17 DIAGNOSIS — I10 Essential (primary) hypertension: Secondary | ICD-10-CM

## 2020-01-17 MED ORDER — ATORVASTATIN CALCIUM 80 MG PO TABS: 80.0000 mg | ORAL_TABLET | Freq: Every day | ORAL | 3 refills | Status: DC

## 2020-01-17 MED ORDER — LOSARTAN POTASSIUM 100 MG PO TABS: 100.0000 mg | ORAL_TABLET | Freq: Every day | ORAL | 3 refills | Status: DC

## 2020-01-17 MED ORDER — TICAGRELOR 90 MG PO TABS: 90.0000 mg | ORAL_TABLET | Freq: Two times a day (BID) | ORAL | 3 refills | Status: DC

## 2020-01-17 NOTE — Patient Instructions (Signed)
Medication Instructions:  Continue current medications  *If you need a refill on your cardiac medications before your next appointment, please call your pharmacy*   Lab Work: None Ordered   Testing/Procedures: None Ordered   Follow-Up: At [facility], you and your health needs are our priority.  As part of our continuing mission to provide you with exceptional heart care, we have created designated Provider Care Teams.  These Care Teams include your primary Cardiologist (physician) and Advanced Practice Providers (APPs -  Physician Assistants and Nurse Practitioners) who all work together to provide you with the care you need, when you need it.  We recommend signing up for the patient portal called "MyChart".  Sign up information is provided on this After Visit Summary.  MyChart is used to connect with patients for Virtual Visits (Telemedicine).  Patients are able to view lab/test results, encounter notes, upcoming appointments, etc.  Non-urgent messages can be sent to your provider as well.   To learn more about what you can do with MyChart, go to [email or web address].    Your next appointment:   6 month(s)  The format for your next appointment:   In Person  Provider:   You may see James Hochrein, MD or one of the following Advanced Practice Providers on your designated Care Team:    Rhonda Barrett, PA-C  Kathryn Lawrence, DNP, ANP  Cadence Furth, NP     

## 2020-02-01 ENCOUNTER — Encounter (HOSPITAL_COMMUNITY)
Admission: RE | Admit: 2020-02-01 | Discharge: 2020-02-01 | Disposition: A | Discharge status: Home / Self Care | Payer: Self-pay | Source: Ambulatory Visit | Attending: Cardiology | Admitting: Cardiology

## 2020-02-01 ENCOUNTER — Other Ambulatory Visit: Payer: Self-pay

## 2020-02-01 VITALS — BP 126/80 | HR 82 | Ht 68.0 in | Wt 320.5 lb

## 2020-02-01 DIAGNOSIS — Z955 Presence of coronary angioplasty implant and graft: Secondary | ICD-10-CM

## 2020-02-02 NOTE — Progress Notes (Signed)
Patient completed the virtual cardiac rehab program and has done well. Patient is walking, riding his recumbent bike at home or exercising at gym (25 minutes on TM, 20 minutes on recumbent bike, weights) at least 4 days/week. Patient states he's still having some SOB but feels it's medicaiton related. Patient plans to discuss with his physician.   Sol Passer, MS, ACSM CEP

## 2020-02-08 NOTE — Addendum Note (Signed)
Encounter addended by: Sol Passer on: 02/08/2020 3:32 PM  Actions taken: Episode resolved

## 2020-02-10 ENCOUNTER — Telehealth: Payer: Self-pay | Admitting: Adult Health

## 2020-02-10 NOTE — Telephone Encounter (Signed)
Pt c/o Shortness Of Breath: STAT if SOB developed within the last 24 hours or pt is noticeably SOB on the phone  1. Are you currently SOB (can you hear that pt is SOB on the phone)? no  2. How long have you been experiencing SOB? ~3 months since starting the brilinta. He was told the SOB would last ~ 3 months and go away as he gets adjusted to the medication   3. Are you SOB when sitting or when up moving around? Siting down but it's sporadic   4. Are you currently experiencing any other symptoms? Cost concerns associated with Brilinta    Pt c/o medication issue:  1. Name of Medication: ticagrelor (BRILINTA) 90 MG TABS tablet  2. How are you currently taking this medication (dosage and times per day)? As directed  3. Are you having a reaction (difficulty breathing--STAT)? Mild SOB  4. What is your medication issue? Patient said the medication would have cost him $1200 when he went to go pick up the new rx. The patient had been using [employer] and even with that program it would be that much. The first few times he paid for the medication he only had to pay ~$50 put of pocket  The patient is out of medication and can not afford the medication

## 2020-02-10 NOTE — Telephone Encounter (Signed)
Spoke with patient who reports continued SOB on Brilinta - sitting down, sporadic. He is able to go to the gym with no issues and do cardiovascular exercise. He is able to walk long distances with no issues.   He also cannot afford Brilinta - was using [employer] discount but latest fill was going to be $1200. I sent him a link to [employer] application for free Brilinta from drug company has he does not have insurance.   Per cath report: Single vessel CAD.  Continue aspirin and Brilinta for 12 months.  If Brilinta is too expensive, could switch to clopidogrel after 1 month.   Called [facility] about cost of medication: confirmed with [facility] that medication is $1200 for a 90 day supply with GoodRx card (which was around $1400 without discount). He had previously gotten a 90 day supply for $75 per pharmacy tech.   Patient took last dose this AM - provided 4 bottles of samples (32 tabs) for him to pick up today  Will send note to MD to see if he would like to change antiplatelet or have patient pursue medication assistance

## 2020-02-12 NOTE — Telephone Encounter (Signed)
OK to change to stop Brilinta and start Plavix 75 mg po daily.

## 2020-02-14 ENCOUNTER — Other Ambulatory Visit: Payer: Self-pay

## 2020-02-14 ENCOUNTER — Other Ambulatory Visit (INDEPENDENT_AMBULATORY_CARE_PROVIDER_SITE_OTHER): Payer: Self-pay

## 2020-02-14 DIAGNOSIS — I10 Essential (primary) hypertension: Secondary | ICD-10-CM

## 2020-02-14 DIAGNOSIS — E785 Hyperlipidemia, unspecified: Secondary | ICD-10-CM

## 2020-02-14 DIAGNOSIS — E1169 Type 2 diabetes mellitus with other specified complication: Secondary | ICD-10-CM

## 2020-02-14 DIAGNOSIS — E119 Type 2 diabetes mellitus without complications: Secondary | ICD-10-CM

## 2020-02-14 DIAGNOSIS — E039 Hypothyroidism, unspecified: Secondary | ICD-10-CM

## 2020-02-14 LAB — CBC WITH DIFFERENTIAL/PLATELET
Basophils Absolute: 0 10*3/uL (ref 0.0–0.1)
Basophils Relative: 0.7 % (ref 0.0–3.0)
Eosinophils Absolute: 0.2 10*3/uL (ref 0.0–0.7)
Eosinophils Relative: 3.8 % (ref 0.0–5.0)
HCT: 38.9 % — ABNORMAL LOW (ref 39.0–52.0)
Hemoglobin: 13.1 g/dL (ref 13.0–17.0)
Lymphocytes Relative: 31.7 % (ref 12.0–46.0)
Lymphs Abs: 1.9 10*3/uL (ref 0.7–4.0)
MCHC: 33.6 g/dL (ref 30.0–36.0)
MCV: 86.9 fl (ref 78.0–100.0)
Monocytes Absolute: 0.4 10*3/uL (ref 0.1–1.0)
Monocytes Relative: 6.8 % (ref 3.0–12.0)
Neutro Abs: 3.4 10*3/uL (ref 1.4–7.7)
Neutrophils Relative %: 57 % (ref 43.0–77.0)
Platelets: 204 10*3/uL (ref 150.0–400.0)
RBC: 4.48 Mil/uL (ref 4.22–5.81)
RDW: 13.7 % (ref 11.5–15.5)
WBC: 6 10*3/uL (ref 4.0–10.5)

## 2020-02-14 LAB — CBC
HCT: 38.9 % — ABNORMAL LOW (ref 39.0–52.0)
Hemoglobin: 13.1 g/dL (ref 13.0–17.0)
MCHC: 33.6 g/dL (ref 30.0–36.0)
MCV: 86.9 fl (ref 78.0–100.0)
Platelets: 204 10*3/uL (ref 150.0–400.0)
RBC: 4.48 Mil/uL (ref 4.22–5.81)
RDW: 13.7 % (ref 11.5–15.5)
WBC: 6 10*3/uL (ref 4.0–10.5)

## 2020-02-14 LAB — LIPID PANEL
Cholesterol: 116 mg/dL (ref 0–200)
HDL: 31.3 mg/dL — ABNORMAL LOW (ref >39.00)
LDL Cholesterol: 61 mg/dL (ref 0–99)
NonHDL: 85.14
Total CHOL/HDL Ratio: 4
Triglycerides: 120 mg/dL (ref 0.0–149.0)
VLDL: 24 mg/dL (ref 0.0–40.0)

## 2020-02-14 LAB — COMPREHENSIVE METABOLIC PANEL
ALT: 32 U/L (ref 0–53)
AST: 22 U/L (ref 0–37)
Albumin: 4.1 g/dL (ref 3.5–5.2)
Alkaline Phosphatase: 108 U/L (ref 39–117)
BUN: 13 mg/dL (ref 6–23)
CO2: 27 mEq/L (ref 19–32)
Calcium: 8.8 mg/dL (ref 8.4–10.5)
Chloride: 103 mEq/L (ref 96–112)
Creatinine, Ser: 0.99 mg/dL (ref 0.40–1.50)
GFR: 79.13 mL/min (ref >60.00)
Glucose, Bld: 163 mg/dL — ABNORMAL HIGH (ref 70–99)
Potassium: 3.8 mEq/L (ref 3.5–5.1)
Sodium: 137 mEq/L (ref 135–145)
Total Bilirubin: 0.7 mg/dL (ref 0.2–1.2)
Total Protein: 6.2 g/dL (ref 6.0–8.3)

## 2020-02-14 LAB — HM DIABETES EYE EXAM

## 2020-02-14 LAB — HEMOGLOBIN A1C: Hgb A1c MFr Bld: 8 % — ABNORMAL HIGH (ref 4.6–6.5)

## 2020-02-14 LAB — TSH: TSH: 2.23 u[IU]/mL (ref 0.35–4.50)

## 2020-02-14 MED ORDER — CLOPIDOGREL BISULFATE 75 MG PO TABS: 75.0000 mg | ORAL_TABLET | Freq: Every day | ORAL | 3 refills | Status: DC

## 2020-02-14 NOTE — Telephone Encounter (Signed)
Spoke with patient. Plavix started at 75mg  daily, Brillinta discontinued. Patient will call back if he is unable to afford Plavix. Prescription sent to preferred pharmacy.

## 2020-02-18 ENCOUNTER — Other Ambulatory Visit: Payer: Self-pay

## 2020-02-22 ENCOUNTER — Other Ambulatory Visit: Payer: Self-pay

## 2020-02-22 ENCOUNTER — Encounter: Payer: Self-pay | Admitting: Family Medicine

## 2020-02-22 ENCOUNTER — Ambulatory Visit (INDEPENDENT_AMBULATORY_CARE_PROVIDER_SITE_OTHER): Payer: Self-pay | Admitting: Family Medicine

## 2020-02-22 VITALS — BP 120/60 | HR 88 | Temp 97.2°F | Ht 68.0 in | Wt 320.6 lb

## 2020-02-22 DIAGNOSIS — M25551 Pain in right hip: Secondary | ICD-10-CM

## 2020-02-22 DIAGNOSIS — E039 Hypothyroidism, unspecified: Secondary | ICD-10-CM

## 2020-02-22 DIAGNOSIS — I10 Essential (primary) hypertension: Secondary | ICD-10-CM

## 2020-02-22 DIAGNOSIS — Z1211 Encounter for screening for malignant neoplasm of colon: Secondary | ICD-10-CM

## 2020-02-22 DIAGNOSIS — R358 Other polyuria: Secondary | ICD-10-CM

## 2020-02-22 DIAGNOSIS — E1169 Type 2 diabetes mellitus with other specified complication: Secondary | ICD-10-CM

## 2020-02-22 DIAGNOSIS — E119 Type 2 diabetes mellitus without complications: Secondary | ICD-10-CM

## 2020-02-22 DIAGNOSIS — E785 Hyperlipidemia, unspecified: Secondary | ICD-10-CM

## 2020-02-22 DIAGNOSIS — R3589 Other polyuria: Secondary | ICD-10-CM

## 2020-02-22 LAB — POC URINALSYSI DIPSTICK (AUTOMATED)
Bilirubin, UA: NEGATIVE
Blood, UA: NEGATIVE
Glucose, UA: POSITIVE — AB
Ketones, UA: POSITIVE
Leukocytes, UA: NEGATIVE
Nitrite, UA: NEGATIVE
Protein, UA: NEGATIVE
Spec Grav, UA: 1.025 (ref 1.010–1.025)
Urobilinogen, UA: 1 E.U./dL
pH, UA: 5.5 (ref 5.0–8.0)

## 2020-02-22 MED ORDER — PREDNISONE 20 MG PO TABS
ORAL_TABLET | ORAL | 0 refills | Status: DC
Start: 2020-02-22 — End: 2020-05-30

## 2020-02-22 NOTE — Progress Notes (Addendum)
Phone [phone] In person visit   Subjective:   Gabriel [name] is a 54 y.o. year old very pleasant male patient who presents for/with See problem oriented charting Chief Complaint  Patient presents with  . Follow-up  . Diabetes  . Hypertension    This visit occurred during the SARS-CoV-2 public health emergency.  Safety protocols were in place, including screening questions prior to the visit, additional usage of staff PPE, and extensive cleaning of exam room while observing appropriate contact time as indicated for disinfecting solutions.   Past Medical History-  Patient Active Problem List   Diagnosis Date Noted  . Coronary Artery Disease 08/26/2013    Priority: High  . Diabetes mellitus without complication ([place]) 09/73/5329    Priority: High  . 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 Morbid obesity ([place]) 08/10/2013    Priority: Medium  . Essential hypertension 02/06/2011    Priority: Medium  . Hypothyroidism 02/06/2011    Priority: Medium  . Hyperlipidemia associated with type 2 diabetes mellitus ([place]) 02/06/2011    Priority: Medium  . Colon cancer screening 10/27/2018    Priority: Low  . Midepigastric pain 09/27/2018    Priority: Low  . Change in bowel habits 09/27/2018    Priority: Low  . Family hx colonic polyps 09/27/2018    Priority: Low  . BRBPR (bright red blood per rectum) 09/27/2018    Priority: Low  . Left lateral epicondylitis 03/18/2016    Priority: Low  . GERD (gastroesophageal reflux disease) 02/01/2015    Priority: Low  . Dysphagia 09/30/2013    Priority: Low    Medications- reviewed and updated Current Outpatient Medications  Medication Sig Dispense Refill  . acetaminophen (TYLENOL) 500 MG tablet Take 1,000 mg by mouth every 6 (six) hours as needed for mild pain or moderate pain. Reported on 01/09/2016    . aspirin EC 81 MG EC tablet Take 1 tablet (81 mg total) by mouth daily.    . clopidogrel (PLAVIX) 75 MG tablet  Take 1 tablet (75 mg total) by mouth daily. 90 tablet 3  . EUTHYROX 125 MCG tablet TAKE 1 TABLET BY MOUTH ONCE DAILY BEFORE BREAKFAST 90 tablet 1  . glimepiride (AMARYL) 4 MG tablet TAKE 2 TABLET BY MOUTH ONCE DAILY BEFORE  BREAKFAST 180 tablet 3  . glucose blood (ONE TOUCH TEST STRIPS) test strip Up to 4 times daily.  Onetouch Verio.  E11.65 100 each 12  . insulin NPH Human (NOVOLIN N) 100 UNIT/ML injection Inject 0.05-0.15 mLs (5-15 Units total) into the skin daily before breakfast. (Patient taking differently: Inject 5 Units into the skin daily before breakfast. ) 10 mL 11  . Lancets (ONETOUCH ULTRASOFT) lancets Up to 4 times daily.  Onetouch Verio.  DxE11.65 100 each 12  . losartan (COZAAR) 100 MG tablet Take 1 tablet (100 mg total) by mouth daily. 90 tablet 3  . metFORMIN (GLUCOPHAGE) 1000 MG tablet TAKE 1 TABLET BY MOUTH TWICE DAILY WITH  A  MEAL 180 tablet 0  . Multiple Vitamins-Minerals (MULTIVITAMIN,TX-MINERALS) tablet Take 1 tablet by mouth daily.      . nitroGLYCERIN (NITROSTAT) 0.4 MG SL tablet Place 1 tablet (0.4 mg total) under the tongue every 5 (five) minutes as needed for chest pain (CP or SOB). 25 tablet 3  . Omega-3 Fatty Acids (FISH OIL) 1000 MG CAPS Take 1,000 mg by mouth every other day.     [name] omeprazole (PRILOSEC) 20 MG capsule Take 1 capsule (  20 mg total) by mouth daily. 30 capsule 3  . atorvastatin (LIPITOR) 80 MG tablet Take 1 tablet (80 mg total) by mouth daily at 6 PM. 90 tablet 3  . predniSONE (DELTASONE) 20 MG tablet Take 1 tablet by mouth daily for 5 days, then 1/2 tablet daily for 2 days 6 tablet 0   No current facility-administered medications for this visit.     Objective:  BP 120/60   Pulse 88   Temp (!) 97.2 F (36.2 C)   Ht 5\' 8"  (1.727 m)   Wt (!) 320 lb 9.6 oz (145.4 kg)   SpO2 96%   BMI 48.75 kg/m  Gen: NAD, resting comfortably CV: RRR no murmurs rubs or gallops Lungs: CTAB no crackles, wheeze, rhonchi Ext: 1+ edema advised compressoin stockings    Skin: warm, dry No obvious groin hernia noted MSK: Left hip exam normal.  On right hip exam has some pain with flexion.  Stinchfield test is positive.    Assessment and Plan  Right groin Pain S: pt states he thinks he may have pulled something in his right groin area. Tweaked this going up and down a ladder. If he is walking can hit hard with pain in right groin. No abdominal pain. Worse with lifting leg. With walking can get up to 10/10 for split second. 2-3 achy pain otherwise with walking. Worse with strain  A/P: Right groin pain with motion-exam is consistent with osteoarthritis of the hip.  I do not feel an obvious hernia. -Tough situation with NSAIDs not being ideal is on Brilinta at present -Could use Celebrex but this could increase cardiac risk -Hesitant about prednisone with blood sugar and potential to make fracture healing more difficult if there is a fracture that we think the risk of this is low -After discussion today we ultimately opted for low-dose prednisone.  He is going to watch his sugar carefully as well -We also agreed to take the risk with not checking hip x-ray due to potential cost of $300  #Frequent Urination S: pt states he is not able to hold his urine and he is going frequently about 25 times a day especially when he moves a certain way. He denies pain and burning with urination, he just got back from [place] and had to stop at least 10 times on the way. Keeps a urinal by the bed. Small amount frequently. 2-3 months of issues  Drinks about 100 oz a day- feels thirsty in general- no recent worsening Lab Results  Component Value Date   PSA 0.4 06/25/2019  A/P: Poor control diabetes could be contributing but with A1c only in 8 I do not strongly suspect this- exercise has been down for last montht hough so that could be contributing.  We will get a urinalysis and urine culture.  I will also update rectal exam to make sure he has a nontender prostate.  -could cut back  on frluid intake  # Diabetes S: Medication:Metformin 1000Mg  BID, NPH 5 units in the morning, glimipride 8mg  CBGs- 160-180 each morning. No sugars below 70- rarely down to 100.  Exercise and diet- sugars better with exercise but with groin issue not able to exercise for a month. Weight pretty stable.  Lab Results  Component Value Date   HGBA1C 8.0 (H) 02/14/2020   HGBA1C 8.1 (H) 10/03/2019   HGBA1C 7.3 (H) 06/25/2019   A/P: Poor control diabetes with A1c up to 8-exercise really helps him but he has been limited by his  hip-we are going to take a gamble here with prednisone to see if it will allow him to get back to exercise though we know it may increase sugar short-term. -He is going to increase his insulin NPH to 7 units and if blood sugar goes up with prednisone can take up to 10 units such as if blood sugar is getting above 180. -Asked him to let me know how he is doing in about 2 weeks.  Could consider sports medicine referral but may be cost prohibitive.  #hypertension S: medication: losartan 100mg - started back yesterday- out for 2 weeks prior BP Readings from Last 3 Encounters:  02/22/20 120/60  02/01/20 126/80  01/17/20 118/70  A/P: Stable. Continue current medications.   #hyperlipidemia S: Medication: Omega 3, Atorvastatin 80Mg  Lab Results  Component Value Date   CHOL 116 02/14/2020   HDL 31.30 (L) 02/14/2020   LDLCALC 61 02/14/2020   LDLDIRECT 68.0 03/11/2019   TRIG 120.0 02/14/2020   CHOLHDL 4 02/14/2020   A/P: excellent control- continue current meds  #hypothyroidism S: compliant On thyroid medication-Euthyrox 125 mcg Lab Results  Component Value Date   TSH 2.23 02/14/2020   A/P: Excellent control-continue current medications  Recommended follow up: Return in about 14 weeks (around 05/30/2020) for follow up- or sooner if needed. POC a1c next time  Lab/Order associations:   ICD-10-CM   1. Essential hypertension  I10   2. Gastroesophageal reflux disease without  esophagitis  K21.9   3. Hypothyroidism, unspecified type  E03.9   4. Hyperlipidemia associated with type 2 diabetes mellitus ([place])  E11.69    E78.5   5. Diabetes mellitus without complication (HCC)  J44.9   6. Polyuria  R35.8 POCT Urinalysis Dipstick (Automated)    Urine Culture    Meds ordered this encounter  Medications  . predniSONE (DELTASONE) 20 MG tablet    Sig: Take 1 tablet by mouth daily for 5 days, then 1/2 tablet daily for 2 days    Dispense:  6 tablet    Refill:  0    Return precautions advised.  Garret Reddish, MD

## 2020-02-22 NOTE — Patient Instructions (Addendum)
Health Maintenance Due  Topic Date Due  . OPHTHALMOLOGY EXAM- Sign release of information at the check out desk for eye exam  10/26/2016  . COLONOSCOPY -pick up stool cards from labs 04/21/2017   POC a1c next visit/fingerbprick  Increase NPH to 7 units as long as no blow blood sugars- can go up to 10 units if sugars above 180- also let us know if you have to get up to that level  Right groin pain with motion-exam is consistent with osteoarthritis of the hip.  I do not feel an obvious hernia. -Tough situation with NSAIDs not being ideal is on Brilinta at present -Could use Celebrex but this could increase cardiac risk -Hesitant about prednisone with blood sugar and potential to make fracture healing more difficult if there is a fracture that we think the risk of this is low -After discussion today we ultimately opted for low-dose prednisone.  He is going to watch his sugar carefully as well -We also agreed to take the risk with not checking hip x-ray due to potential cost of $300  Please stop by lab before you go- urine If you have mychart- we will send your results within 3 business days of [place] receiving them.  If you do not have mychart- we will call you about results within 5 business days of [place] receiving them.

## 2020-02-22 NOTE — Addendum Note (Signed)
Addended by: Doran Clay A on: 02/22/2020 02:52 PM   Modules accepted: Orders

## 2020-02-23 LAB — URINE CULTURE
MICRO NUMBER:: 10593097
SPECIMEN QUALITY:: ADEQUATE

## 2020-02-25 ENCOUNTER — Other Ambulatory Visit: Payer: Self-pay | Admitting: Adult Health

## 2020-02-25 NOTE — Telephone Encounter (Signed)
Disp Refills Start End   clopidogrel (PLAVIX) 75 MG tablet 90 tablet 3 02/14/2020    Sig - Route: Take 1 tablet (75 mg total) by mouth daily. - Oral   Sent to pharmacy as: clopidogrel (PLAVIX) 75 MG tablet   E-Prescribing Status: Receipt confirmed by pharmacy (02/14/2020 10:20 AM EDT)   Pharmacy  [id number] - [place], [place]   Unable to reach patient x2

## 2020-02-25 NOTE — Telephone Encounter (Signed)
New Message:   Pt said his Brilinta was supposed to be changed to another medicine. He would like for you to call the new medicine in to [facility] in [facility]

## 2020-03-03 NOTE — Telephone Encounter (Signed)
Unable to reach pt or leave a message no mailbox °

## 2020-03-09 ENCOUNTER — Encounter: Payer: Self-pay | Admitting: Family Medicine

## 2020-04-02 ENCOUNTER — Other Ambulatory Visit: Payer: Self-pay | Admitting: Family Medicine

## 2020-05-30 ENCOUNTER — Other Ambulatory Visit: Payer: Self-pay

## 2020-05-30 ENCOUNTER — Ambulatory Visit (INDEPENDENT_AMBULATORY_CARE_PROVIDER_SITE_OTHER): Payer: No Typology Code available for payment source | Admitting: Family Medicine

## 2020-05-30 ENCOUNTER — Encounter: Payer: Self-pay | Admitting: Family Medicine

## 2020-05-30 VITALS — BP 124/72 | HR 86 | Temp 98.1°F | Ht 68.0 in | Wt 319.0 lb

## 2020-05-30 DIAGNOSIS — E039 Hypothyroidism, unspecified: Secondary | ICD-10-CM | POA: Diagnosis not present

## 2020-05-30 DIAGNOSIS — E1169 Type 2 diabetes mellitus with other specified complication: Secondary | ICD-10-CM

## 2020-05-30 DIAGNOSIS — E119 Type 2 diabetes mellitus without complications: Secondary | ICD-10-CM

## 2020-05-30 DIAGNOSIS — Z1159 Encounter for screening for other viral diseases: Secondary | ICD-10-CM

## 2020-05-30 DIAGNOSIS — I2510 Atherosclerotic heart disease of native coronary artery without angina pectoris: Secondary | ICD-10-CM | POA: Diagnosis not present

## 2020-05-30 DIAGNOSIS — Z125 Encounter for screening for malignant neoplasm of prostate: Secondary | ICD-10-CM

## 2020-05-30 DIAGNOSIS — E785 Hyperlipidemia, unspecified: Secondary | ICD-10-CM

## 2020-05-30 DIAGNOSIS — I10 Essential (primary) hypertension: Secondary | ICD-10-CM

## 2020-05-30 MED ORDER — INSULIN NPH (HUMAN) (ISOPHANE) 100 UNIT/ML ~~LOC~~ SUSP: 4.0000 [IU] | Freq: Two times a day (BID) | SUBCUTANEOUS | 11 refills | Status: DC

## 2020-05-30 NOTE — Progress Notes (Signed)
Phone [phone] In person visit   Subjective:   Gabriel [name] is a 54 y.o. year old very pleasant male patient who presents for/with See problem oriented charting Chief Complaint  Patient presents with  . Diabetes   This visit occurred during the SARS-CoV-2 public health emergency.  Safety protocols were in place, including screening questions prior to the visit, additional usage of staff PPE, and extensive cleaning of exam room while observing appropriate contact time as indicated for disinfecting solutions.   Past Medical History-  Patient Active Problem List   Diagnosis Date Noted  . Coronary Artery Disease 08/26/2013    Priority: High  . Diabetes mellitus without complication ([place]) 87/86/7672    Priority: High  . 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 Morbid obesity ([place]) 08/10/2013    Priority: Medium  . Essential hypertension 02/06/2011    Priority: Medium  . Hypothyroidism 02/06/2011    Priority: Medium  . Hyperlipidemia associated with type 2 diabetes mellitus ([place]) 02/06/2011    Priority: Medium  . Colon cancer screening 10/27/2018    Priority: Low  . Midepigastric pain 09/27/2018    Priority: Low  . Change in bowel habits 09/27/2018    Priority: Low  . Family hx colonic polyps 09/27/2018    Priority: Low  . BRBPR (bright red blood per rectum) 09/27/2018    Priority: Low  . Left lateral epicondylitis 03/18/2016    Priority: Low  . GERD (gastroesophageal reflux disease) 02/01/2015    Priority: Low  . Dysphagia 09/30/2013    Priority: Low    Medications- reviewed and updated Current Outpatient Medications  Medication Sig Dispense Refill  . acetaminophen (TYLENOL) 500 MG tablet Take 1,000 mg by mouth every 6 (six) hours as needed for mild pain or moderate pain. Reported on 01/09/2016    . aspirin EC 81 MG EC tablet Take 1 tablet (81 mg total) by mouth daily.    . clopidogrel (PLAVIX) 75 MG tablet Take 1 tablet (75 mg total) by  mouth daily. 90 tablet 3  . EUTHYROX 125 MCG tablet TAKE 1 TABLET BY MOUTH ONCE DAILY BEFORE BREAKFAST 90 tablet 1  . glimepiride (AMARYL) 4 MG tablet TAKE 2 TABLET BY MOUTH ONCE DAILY BEFORE  BREAKFAST 180 tablet 3  . glucose blood (ONE TOUCH TEST STRIPS) test strip Up to 4 times daily.  Onetouch Verio.  E11.65 100 each 12  . insulin NPH Human (NOVOLIN N) 100 UNIT/ML injection Inject 0.04-0.15 mLs (4-15 Units total) into the skin 2 (two) times daily before a meal. 10 mL 11  . Lancets (ONETOUCH ULTRASOFT) lancets Up to 4 times daily.  Onetouch Verio.  DxE11.65 100 each 12  . losartan (COZAAR) 100 MG tablet Take 1 tablet (100 mg total) by mouth daily. 90 tablet 3  . metFORMIN (GLUCOPHAGE) 1000 MG tablet TAKE 1 TABLET BY MOUTH TWICE DAILY WITH A MEAL 180 tablet 0  . Multiple Vitamins-Minerals (MULTIVITAMIN,TX-MINERALS) tablet Take 1 tablet by mouth daily.      . nitroGLYCERIN (NITROSTAT) 0.4 MG SL tablet Place 1 tablet (0.4 mg total) under the tongue every 5 (five) minutes as needed for chest pain (CP or SOB). 25 tablet 3  . Omega-3 Fatty Acids (FISH OIL) 1000 MG CAPS Take 1,000 mg by mouth every other day.     [name] omeprazole (PRILOSEC) 20 MG capsule Take 1 capsule (20 mg total) by mouth daily. 30 capsule 3  . atorvastatin (LIPITOR) 80 MG tablet Take 1  tablet (80 mg total) by mouth daily at 6 PM. 90 tablet 3   No current facility-administered medications for this visit.     Objective:  BP 124/72   Pulse 86   Temp 98.1 F (36.7 C) (Temporal)   Ht 5\' 8"  (1.727 m)   Wt (!) 319 lb (144.7 kg)   SpO2 95%   BMI 48.50 kg/m  Gen: NAD, resting comfortably CV: RRR no murmurs rubs or gallops Lungs: CTAB no crackles, wheeze, rhonchi Ext: trace to 1+ edema Skin: warm, dry    Assessment and Plan   # Diabetes- options limited due to insurance S: Medication:Metformin 1000 mg twice daily, NPH 5 units in the morning-increased to 7 units last visit with plan for up to 10 while on prednisone- back down  to 7 units, glimepiride 8 mg CBGs- sugars are usually above 200 fasting. Sugars slightly higher on prednisone but has been off for a while.   was on low dose prednisone last visit for hip arthritis (risks on nsaids so opted out and we accepted increased risk of sugar being elevated) Exercise and diet- has not been able to exercise for about 4 weeks. Numbers were better with exercise- neck is at a point where he can restart- started back Saturday.   Has frequent urination but drinks a lot of water Lab Results  Component Value Date   HGBA1C 8.0 (H) 02/14/2020   HGBA1C 8.1 (H) 10/03/2019   HGBA1C 7.3 (H) 06/25/2019   A/P: poor control of diabetes- did better on toujeo in the past. Will get a1c today. Change from 7 units nph in AM to 4 units in AM and add in evening dose before bed another 4 units. Update me in 1 week with mychart blood sugars and will increase further.  - suspect polyuria related to high sugars but also check psa with labs to eval prostate cancer  # right hip pain- likely OA- improved on prednisone  #hypertension S: medication: Losartan 100 mg Home readings #s:  125/70 BP Readings from Last 3 Encounters:  05/30/20 124/72  02/22/20 120/60  02/01/20 126/80  A/P: Stable. Continue current medications.    #hyperlipidemia # CAD- no chest pain on plavix and aspirin- no SOB like had on brilinta S: Medication:Omega-3, atorvastatin 80 mg Lab Results  Component Value Date   CHOL 116 02/14/2020   HDL 31.30 (L) 02/14/2020   LDLCALC 61 02/14/2020   LDLDIRECT 68.0 03/11/2019   TRIG 120.0 02/14/2020   CHOLHDL 4 02/14/2020   A/P: Excellent control on last check-continue current medication   CAD appears stable  #hypothyroidism S: compliant On thyroid medication-Euthyrox 125 mcg Lab Results  Component Value Date   TSH 2.23 02/14/2020  A/P:Suspect controlled-continue current medication   # colon cancer screening- dropped off stool cards today which would be good for 1 year.  Insurance doesn't pay for colonoscopy or cologuard  Recommended follow up: Return in about 14 weeks (around 09/05/2020) for follow up- or sooner if needed.   Lab/Order associations:   ICD-10-CM   1. Encounter for hepatitis C screening test for low risk patient  Z11.59 Hepatitis C antibody  2. Screening for prostate cancer  Z12.5 PSA  3. Diabetes mellitus without complication (HCC)  N46.2 COMPLETE METABOLIC PANEL WITH GFR    Hemoglobin A1c  4. Hyperlipidemia associated with type 2 diabetes mellitus ([place])  E11.69    E78.5   5. Essential hypertension  I10   6. Hypothyroidism, unspecified type  E03.9   7. Atherosclerosis  of native coronary artery of native heart without angina pectoris  I25.10     Meds ordered this encounter  Medications  . insulin NPH Human (NOVOLIN N) 100 UNIT/ML injection    Sig: Inject 0.04-0.15 mLs (4-15 Units total) into the skin 2 (two) times daily before a meal.    Dispense:  10 mL    Refill:  11    Return precautions advised.  Garret Reddish, MD

## 2020-05-30 NOTE — Patient Instructions (Addendum)
Health Maintenance Due  Topic Date Due  . COVID-19 Vaccine (1) Has not received his vaccine. Please strongly consider getting this vaccination with your age and other risk factors Never done  . COLON CANCER SCREENING ANNUAL FOBT Patient dropped off the cards to our lab today 08/13/2019   poor control of diabetes- did better on toujeo in the past. Will get a1c today. Change from 7 units nph in AM to 4 units in AM and add in evening dose before bed another 4 units. Update me in 1 week with mychart blood sugars and will increase further.   Please stop by lab before you go If you have mychart- we will send your results within 3 business days of [place] receiving them.  If you do not have mychart- we will call you about results within 5 business days of [place] receiving them.  *please note we are currently using [facility] which has a longer processing time than [place] typically so labs may not come back as quickly as in the past *please also note that you will see labs on mychart as soon as they post. I will later go in and write notes on them- will say "notes from Dr. Yong Channel"

## 2020-05-31 ENCOUNTER — Other Ambulatory Visit (INDEPENDENT_AMBULATORY_CARE_PROVIDER_SITE_OTHER): Payer: No Typology Code available for payment source

## 2020-05-31 ENCOUNTER — Other Ambulatory Visit: Payer: Self-pay

## 2020-05-31 DIAGNOSIS — Z1211 Encounter for screening for malignant neoplasm of colon: Secondary | ICD-10-CM

## 2020-05-31 LAB — HEMOGLOBIN A1C
Hgb A1c MFr Bld: 9 % of total Hgb — ABNORMAL HIGH (ref <5.7)
Mean Plasma Glucose: 212 (calc)
eAG (mmol/L): 11.7 (calc)

## 2020-05-31 LAB — COMPLETE METABOLIC PANEL WITH GFR
AG Ratio: 2 (calc) (ref 1.0–2.5)
ALT: 35 U/L (ref 9–46)
AST: 24 U/L (ref 10–35)
Albumin: 4.3 g/dL (ref 3.6–5.1)
Alkaline phosphatase (APISO): 95 U/L (ref 35–144)
BUN: 11 mg/dL (ref 7–25)
CO2: 27 mmol/L (ref 20–32)
Calcium: 9.2 mg/dL (ref 8.6–10.3)
Chloride: 101 mmol/L (ref 98–110)
Creat: 0.97 mg/dL (ref 0.70–1.33)
GFR, Est African American: 103 mL/min/{1.73_m2} (ref >=60)
GFR, Est Non African American: 89 mL/min/{1.73_m2} (ref >=60)
Globulin: 2.2 g/dL (calc) (ref 1.9–3.7)
Glucose, Bld: 193 mg/dL — ABNORMAL HIGH (ref 65–99)
Potassium: 4.4 mmol/L (ref 3.5–5.3)
Sodium: 137 mmol/L (ref 135–146)
Total Bilirubin: 1.1 mg/dL (ref 0.2–1.2)
Total Protein: 6.5 g/dL (ref 6.1–8.1)

## 2020-05-31 LAB — HEPATITIS C ANTIBODY
Hepatitis C Ab: NONREACTIVE
SIGNAL TO CUT-OFF: 0.01 (ref <1.00)

## 2020-05-31 LAB — PSA: PSA: 0.48 ng/mL (ref <=4.0)

## 2020-06-01 ENCOUNTER — Telehealth: Payer: Self-pay

## 2020-06-01 ENCOUNTER — Other Ambulatory Visit: Payer: Self-pay

## 2020-06-01 DIAGNOSIS — K921 Melena: Secondary | ICD-10-CM

## 2020-06-01 LAB — FECAL OCCULT BLOOD, IMMUNOCHEMICAL: Fecal Occult Bld: POSITIVE — AB

## 2020-06-01 NOTE — Telephone Encounter (Signed)
Referral has been placed to GI. Patient is aware.

## 2020-06-01 NOTE — Telephone Encounter (Signed)
Spoke with the lab tech at [facility] and she stated that the patient's IFOBT came back positive.

## 2020-06-01 NOTE — Telephone Encounter (Signed)
Please refer to GI under positive hemocult/blood in stool. I know patients insurance is not the best but with blood in the stool we really need to get colonoscopy done

## 2020-06-04 ENCOUNTER — Other Ambulatory Visit: Payer: Self-pay | Admitting: Family Medicine

## 2020-06-21 ENCOUNTER — Other Ambulatory Visit: Payer: Self-pay | Admitting: Family Medicine

## 2020-07-11 ENCOUNTER — Encounter: Payer: Self-pay | Admitting: Physician Assistant

## 2020-07-11 ENCOUNTER — Ambulatory Visit (INDEPENDENT_AMBULATORY_CARE_PROVIDER_SITE_OTHER): Payer: Self-pay | Admitting: Physician Assistant

## 2020-07-11 ENCOUNTER — Telehealth: Payer: Self-pay | Admitting: *Deleted

## 2020-07-11 VITALS — BP 124/64 | HR 86 | Ht 69.0 in | Wt 318.0 lb

## 2020-07-11 DIAGNOSIS — K219 Gastro-esophageal reflux disease without esophagitis: Secondary | ICD-10-CM

## 2020-07-11 DIAGNOSIS — R109 Unspecified abdominal pain: Secondary | ICD-10-CM

## 2020-07-11 DIAGNOSIS — R195 Other fecal abnormalities: Secondary | ICD-10-CM

## 2020-07-11 NOTE — Telephone Encounter (Signed)
[facility] Pre-operative Risk Assessment     Request for surgical clearance:     Endoscopy Procedure  What type of surgery is being performed?     Colonoscopy  When is this surgery scheduled?     Tuesday 07/17/20  What type of clearance is required ?   Pharmacy  Are there any medications that need to be held prior to surgery and how long? Plavix 5 days  Practice name and name of physician performing surgery?      [facility]  What is your office phone and fax number?      Phone- [phone]  [phone]  Anesthesia type (None, local, MAC, general) ?       MAC

## 2020-07-11 NOTE — Patient Instructions (Signed)
If you are age 53 or older, your body mass index should be between 23-30. Your Body mass index is 46.96 kg/m. If this is out of the aforementioned range listed, please consider follow up with your Primary Care Provider.  If you are age 72 or younger, your body mass index should be between 19-25. Your Body mass index is 46.96 kg/m. If this is out of the aformentioned range listed, please consider follow up with your Primary Care Provider.   You have been scheduled for a colonoscopy. Please follow written instructions given to you at your visit today.  Please pick up your prep supplies at the pharmacy within the next 1-3 days. If you use inhalers (even only as needed), please bring them with you on the day of your procedure.  Due to recent changes in healthcare laws, you may see the results of your imaging and laboratory studies on MyChart before your provider has had a chance to review them.  We understand that in some cases there may be results that are confusing or concerning to you. Not all laboratory results come back in the same time frame and the provider may be waiting for multiple results in order to interpret others.  Please give [place] 48 hours in order for your provider to thoroughly review all the results before contacting the office for clarification of your results.

## 2020-07-11 NOTE — Progress Notes (Addendum)
Gabriel [name] Gabriel [name] wants an EGD added to this patients colon.  See his note.  Thanks    [place] with Gabriel [name]. Patient does NOT need EGD, he had one in 2015 and there was no Barretts and has no symptoms now.  Gabriel [name]

## 2020-07-11 NOTE — Progress Notes (Signed)
Chief Complaint: Blood in stool  HPI:    Gabriel [name] is a 54 year old male with a past medical history of reflux and others listed below including CAD on Plavix, known to Dr. Rush Landmark, who was referred to me by Marin Olp, MD for a complaint of blood in stool.    10/25/1998 colonoscopy with Dr. Henrene Pastor.  This was normal.  Intermittent bleeding secondary to internal hemorrhoids.  Lower abdominal symptoms not due to irritable bowel syndrome.    05/26/2019 patient seen in clinic for his reflux.  At that time we discussed weaning off his omeprazole back in July and almost immediately thereafter started epigastric pain radiating to his chest with reflux.  At that time was also sent to see me today for follow-up colonoscopy but he has recently been scheduled for an exercise tolerance test 06/01/2019.  Was recommended he have a EGD and colonoscopy in the [place] with Dr. Rush Landmark.  He was also started back on his Omeprazole.    10/03/2019 echo with an LVEF 55-60%.  10/04/2019 cardiac cath with stent placement.    02/14/2020 CBC within normal hemoglobin.    06/01/2020 patient had an FOBT positive test.    Today, the patient presents to clinic and tells me he is doing well.  He did have a stent placed back in January but is doing well after this procedure.  Tells me the only GI symptoms he is having is some right sided abdominal cramping pain which seems to occur before a bowel movement and is relieved afterwards.  Tells me he also had a stool test which showed blood in his stool but he has not been seeing anything.    Describes that he is not having any reflux symptoms taking omeprazole on a daily basis.    Currently working out 5 days a week but has not changed his diet at all, trying to lose some weight.    Denies fever, chills, weight loss or symptoms that awaken him from sleep.  Past Medical History:  Diagnosis Date  . Arthritis    "joints; from where I've had surgeries" (08/17/2013)  . Chest pain      a. 08/2013 Cardia CTA: Ca score 238 (95%), LM nl, LAD mod stenosis in D2 area, D1 mild ost stenosis, D2 no signif dzs, LCX small, RCA nl.  . Coronary artery disease    LHC (08/18/13):  pLAD 50, CFX and RCA normal.  EF 55-65%.  LAD lesion FFR:  0.88 (normal).  Med rx recommended.    [name] Dysphagia    Upper endoscopy 11/2013 - without obvious stricture s/p Maloney dilation.   [name] GERD (gastroesophageal reflux disease)   . Hyperlipidemia    a. Dx 3y ago - not on statin.  [name] Hypertension    a. Dx 3y ago.  [name] Hypothyroidism    a. on replacement.  . IBS (irritable bowel syndrome)    colonoscopy 2000  . Obesity   . Sleep apnea    does not use CPAP  . Snoring   . Type II diabetes mellitus ([place])     Past Surgical History:  Procedure Laterality Date  . ANKLE SURGERY Right    "had a growth in it; cut it out" (08/17/2013)  . CARDIAC CATHETERIZATION N/A 09/28/2015   Procedure: Left Heart Cath and Coronary Angiography;  Surgeon: Peter M Martinique, MD;  Location: [facility];  Service: Cardiovascular;  Laterality: N/A;  . CHOLECYSTECTOMY    . CORONARY STENT INTERVENTION N/A 10/04/2019  Procedure: CORONARY STENT INTERVENTION;  Surgeon: Jettie Booze, MD;  Location: [facility];  Service: Cardiovascular;  Laterality: N/A;  . INTRAVASCULAR PRESSURE WIRE/FFR STUDY N/A 10/04/2019   Procedure: INTRAVASCULAR PRESSURE WIRE/FFR STUDY;  Surgeon: Jettie Booze, MD;  Location: [facility];  Service: Cardiovascular;  Laterality: N/A;  . KNEE ARTHROSCOPY Right X 2  . LEFT HEART CATH AND CORONARY ANGIOGRAPHY N/A 10/04/2019   Procedure: LEFT HEART CATH AND CORONARY ANGIOGRAPHY;  Surgeon: Jettie Booze, MD;  Location: [facility];  Service: Cardiovascular;  Laterality: N/A;  . LEFT HEART CATHETERIZATION WITH CORONARY ANGIOGRAM N/A 08/18/2013   Procedure: LEFT HEART CATHETERIZATION WITH CORONARY ANGIOGRAM;  Surgeon: Peter M Martinique, MD;  Location: [facility];  Service:  Cardiovascular;  Laterality: N/A;  . SHOULDER ARTHROSCOPY W/ ROTATOR CUFF REPAIR Left     Current Outpatient Medications  Medication Sig Dispense Refill  . acetaminophen (TYLENOL) 500 MG tablet Take 1,000 mg by mouth every 6 (six) hours as needed for mild pain or moderate pain. Reported on 01/09/2016    . aspirin EC 81 MG EC tablet Take 1 tablet (81 mg total) by mouth daily.    [name] atorvastatin (LIPITOR) 80 MG tablet Take 1 tablet (80 mg total) by mouth daily at 6 PM. 90 tablet 3  . clopidogrel (PLAVIX) 75 MG tablet Take 1 tablet (75 mg total) by mouth daily. 90 tablet 3  . EUTHYROX 125 MCG tablet TAKE 1 TABLET BY MOUTH ONCE DAILY BEFORE BREAKFAST 90 tablet 0  . glimepiride (AMARYL) 4 MG tablet TAKE 2 TABLET BY MOUTH ONCE DAILY BEFORE  BREAKFAST 180 tablet 3  . glucose blood (ONE TOUCH TEST STRIPS) test strip Up to 4 times daily.  Onetouch Verio.  E11.65 100 each 12  . insulin NPH Human (NOVOLIN N) 100 UNIT/ML injection Inject 0.04-0.15 mLs (4-15 Units total) into the skin 2 (two) times daily before a meal. 10 mL 11  . Lancets (ONETOUCH ULTRASOFT) lancets Up to 4 times daily.  Onetouch Verio.  DxE11.65 100 each 12  . losartan (COZAAR) 100 MG tablet Take 1 tablet (100 mg total) by mouth daily. 90 tablet 3  . metFORMIN (GLUCOPHAGE) 1000 MG tablet TAKE 1 TABLET BY MOUTH TWICE DAILY WITH A MEAL 180 tablet 0  . Multiple Vitamins-Minerals (MULTIVITAMIN,TX-MINERALS) tablet Take 1 tablet by mouth daily.      . nitroGLYCERIN (NITROSTAT) 0.4 MG SL tablet Place 1 tablet (0.4 mg total) under the tongue every 5 (five) minutes as needed for chest pain (CP or SOB). 25 tablet 3  . Omega-3 Fatty Acids (FISH OIL) 1000 MG CAPS Take 1,000 mg by mouth every other day.     [name] omeprazole (PRILOSEC) 20 MG capsule Take 1 capsule (20 mg total) by mouth daily. 30 capsule 3   No current facility-administered medications for this visit.    Allergies as of 07/11/2020  . (No Known Allergies)    Family History  Adopted:  Yes  Problem Relation Age of Onset  . Hypertension Mother   . Obesity Mother   . Diabetes Mother   . Coronary artery disease Father        s/p MI in his early 30's->CABG x 5 @ age 59, currently 69.  . Diabetes Father   . Stroke Father   . Hypertension Father   . Diabetes Brother   . Obesity Brother   . Asthma Maternal Grandfather   . Colon cancer Neg Hx   . Esophageal  cancer Neg Hx   . Inflammatory bowel disease Neg Hx   . Liver disease Neg Hx   . Pancreatic cancer Neg Hx   . Rectal cancer Neg Hx   . Stomach cancer Neg Hx     Social History   Socioeconomic History  . Marital status: Single    Spouse name: Not on file  . Number of children: 0  . Years of education: Not on file  . Highest education level: Not on file  Occupational History  . Occupation: Unemployed  Tobacco Use  . Smoking status: Former Smoker    Packs/day: 1.50    Years: 24.00    Pack years: 36.00    Types: Cigarettes    Quit date: 09/10/2007    Years since quitting: 12.8  . Smokeless tobacco: Never Used  Vaping Use  . Vaping Use: Never used  Substance and Sexual Activity  . Alcohol use: No  . Drug use: No  . Sexual activity: Yes  Other Topics Concern  . Not on file  Social History Narrative   Lives in [place] with parents (cares for parents).  Never married. Unemployed.     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[employer] (stops intermittently while caring for parents)   Social Determinants of Health   Financial Resource Strain:   . Difficulty of Paying Living Expenses: Not on file  Food Insecurity:   . Worried About Charity fundraiser in the Last Year: Not on file  . Ran Out of Food in the Last Year: Not on file  Transportation Needs:   . Lack of Transportation (Medical): Not on file  . Lack of Transportation (Non-Medical): Not on file  Physical Activity:   . Days of Exercise per Week: Not on file  . Minutes of Exercise per Session: Not on file  Stress:   . Feeling of Stress : Not on file    Social Connections:   . Frequency of Communication with Friends and Family: Not on file  . Frequency of Social Gatherings with Friends and Family: Not on file  . Attends Religious Services: Not on file  . Active Member of Clubs or Organizations: Not on file  . Attends Archivist Meetings: Not on file  . Marital Status: Not on file  Intimate Partner Violence:   . Fear of Current or Ex-Partner: Not on file  . Emotionally Abused: Not on file  . Physically Abused: Not on file  . Sexually Abused: Not on file    Review of Systems:    Constitutional: No weight loss, fever or chills Cardiovascular: No chest pain Respiratory: No SOB  Gastrointestinal: See HPI and otherwise negative   Physical Exam:  Vital signs: BP 124/64   Pulse 86   Ht 5\' 9"  (1.753 m)   Wt (!) 318 lb (144.2 kg)   BMI 46.96 kg/m   Constitutional:   Pleasant Caucasian male appears to be in NAD, Well developed, Well nourished, alert and cooperative Respiratory: Respirations even and unlabored. Lungs clear to auscultation bilaterally.   No wheezes, crackles, or rhonchi.  Cardiovascular: Normal S1, S2. No MRG. Regular rate and rhythm. No peripheral edema, cyanosis or pallor.  Gastrointestinal:  Soft, nondistended, nontender. No rebound or guarding. Normal bowel sounds. No appreciable masses or hepatomegaly. Rectal:  Not performed.  Psychiatric: Demonstrates good judgement and reason without abnormal affect or behaviors.  Most recent LABS AND IMAGING: CBC    Component Value Date/Time   WBC 6.0 02/14/2020 1035   WBC  6.0 02/14/2020 1035   RBC 4.48 02/14/2020 1035   RBC 4.48 02/14/2020 1035   HGB 13.1 02/14/2020 1035   HGB 13.1 02/14/2020 1035   HGB 13.9 11/16/2019 1453   HCT 38.9 (L) 02/14/2020 1035   HCT 38.9 (L) 02/14/2020 1035   HCT 42.5 11/16/2019 1453   PLT 204.0 02/14/2020 1035   PLT 204.0 02/14/2020 1035   PLT 245 11/16/2019 1453   MCV 86.9 02/14/2020 1035   MCV 86.9 02/14/2020 1035   MCV 88  11/16/2019 1453   MCH 28.8 11/16/2019 1453   MCH 28.8 10/05/2019 0419   MCHC 33.6 02/14/2020 1035   MCHC 33.6 02/14/2020 1035   RDW 13.7 02/14/2020 1035   RDW 13.7 02/14/2020 1035   RDW 13.1 11/16/2019 1453   LYMPHSABS 1.9 02/14/2020 1035   MONOABS 0.4 02/14/2020 1035   EOSABS 0.2 02/14/2020 1035   BASOSABS 0.0 02/14/2020 1035    CMP     Component Value Date/Time   NA 137 05/30/2020 1605   NA 140 11/16/2019 1453   K 4.4 05/30/2020 1605   CL 101 05/30/2020 1605   CO2 27 05/30/2020 1605   GLUCOSE 193 (H) 05/30/2020 1605   BUN 11 05/30/2020 1605   BUN 11 11/16/2019 1453   CREATININE 0.97 05/30/2020 1605   CALCIUM 9.2 05/30/2020 1605   PROT 6.5 05/30/2020 1605   PROT 6.7 11/16/2019 1453   ALBUMIN 4.1 02/14/2020 1035   ALBUMIN 4.4 11/16/2019 1453   AST 24 05/30/2020 1605   ALT 35 05/30/2020 1605   ALKPHOS 108 02/14/2020 1035   BILITOT 1.1 05/30/2020 1605   BILITOT 1.0 11/16/2019 1453   GFRNONAA 89 05/30/2020 1605   GFRAA 103 05/30/2020 1605    Assessment: 1.  Positive FOBT test: 05/31/2020 at PCPs office, patient overdue for colonoscopy anyways 2.  Right-sided abdominal pain: Prior to a bowel movement, relieved after; consider IBS versus other 3.  GERD: Controlled on omeprazole daily  Plan: 1.  Scheduled patient for diagnostic colonoscopy in the [facility] with Dr. Rush Landmark.  Did discuss risks, benefits, limitations and alternatives and patient agrees to proceed.  He will need to be Covid tested 2 days prior to time procedure. 2.  Advised the patient to hold his Plavix for 5 days prior to time procedure.  We will communicate with Dr. Purcell Nails his cardiologist to ensure that holding his Plavix is acceptable for him. 3.  Continue omeprazole as prescribed 4.  Patient to return to clinic per recommendations from Dr. Rush Landmark after time of procedure.  Ellouise Newer, PA-C [facility] 07/11/2020, 10:05 AM  Cc: Marin Olp, MD

## 2020-07-11 NOTE — Progress Notes (Signed)
Attending Physician's Attestation   I have reviewed the chart.   I agree with the Advanced Practitioner's note, impression, and recommendations with any updates as below. Once we obtain approval for Plavix hold would proceed with scheduling Colonoscopy as well as adding an EGD as had previously been our plan.   Justice Britain, MD [facility] Advanced Endoscopy Office # [phone]

## 2020-07-12 ENCOUNTER — Telehealth: Payer: Self-pay

## 2020-07-12 ENCOUNTER — Telehealth: Payer: Self-pay | Admitting: Physician Assistant

## 2020-07-12 NOTE — Telephone Encounter (Signed)
Unable to leave message as mailbox is not set up, will try again later.

## 2020-07-12 NOTE — Telephone Encounter (Signed)
Transferred call to [employer].

## 2020-07-12 NOTE — Telephone Encounter (Signed)
Left message on home phone for the patient to call back and speak to the on call preop APP. Voicemail not set up on the cell phone. Patient's procedure is in 5 days, GI service requested a 5 day hold on plavix. Will forward to MD to review. Last cath with DES placement was in Jan 2021, it was recommended he continue aspirin and plavix for 12 month, since it has only been 10 month since the stent placement, will need MD to review and clear.   Dr. Percival Spanish, please send your response to P [facility] DIV PREOP

## 2020-07-12 NOTE — Telephone Encounter (Signed)
Pt stated that his sugar numbers are running high still in the mornings. They are running approx 210-250. States Dr. Yong Channel wanted him to inform hunter if they stayed high.

## 2020-07-12 NOTE — Telephone Encounter (Signed)
See below

## 2020-07-12 NOTE — Telephone Encounter (Signed)
Unless blood sugars are constantly above 350-400 or lower than 70, he will be OK and we can administer some Insulin if necessary, but those numbers are OK. Thanks. GM

## 2020-07-12 NOTE — Telephone Encounter (Signed)
Pt wants to know how high glucose has to be in order for procedure to be cancelled. He stated that his numbers have been high and he has been having trouble lowering the. His procedure is on 07/25/20.

## 2020-07-12 NOTE — Telephone Encounter (Signed)
The patient has been notified of this information and all questions answered.

## 2020-07-12 NOTE — Telephone Encounter (Signed)
The pt states his blood glucose runs 195- 215 in the mornings after waking and would like to know if this will prevent him from having his  colonoscopy on 11/16.  He is working closely with his PCP to get his sugars under better control.  Please advise

## 2020-07-12 NOTE — Telephone Encounter (Signed)
   Primary Cardiologist: Minus Breeding, MD  Chart reviewed as part of pre-operative protocol coverage. Patient was contacted 07/12/2020 in reference to pre-operative risk assessment for pending surgery as outlined below.  Gabriel [name] was last seen on 01/17/2020 by Jory Sims NP.  Since that day, Gabriel [name] has done well.  Therefore, based on ACC/AHA guidelines, the patient would be at acceptable risk for the planned procedure without further cardiovascular testing.   The patient was advised that if he develops new symptoms prior to surgery to contact our office to arrange for a follow-up visit, and he verbalized understanding.  I will route this recommendation to the requesting party via Epic fax function and remove from pre-op pool. Please call with questions.  Per Dr. Percival Spanish, he may hold plavix for 5 days prior to the procedure and restart as soon as possible after the procedure at the surgeon's discretion.   Gabriel [name], [place] 07/12/2020, 10:55 AM

## 2020-07-12 NOTE — Telephone Encounter (Signed)
I believe he is taking 4 units of NPH in AM and 4 units before dinner. If you confirm this- may increase to 5 units in AM before breakfast and 5 units before dinner. Have him update me in 1 week. Suspect we will need higher doses. Let me know sooner if any sugars under 80

## 2020-07-12 NOTE — Telephone Encounter (Signed)
OK to hold Plavix now as needed.

## 2020-07-13 NOTE — Telephone Encounter (Signed)
Please increase to 6 units in the morning and evening and update me in 1 week

## 2020-07-13 NOTE — Telephone Encounter (Signed)
Called to give pt below information, however no answer and pt has a voicemail that has not been setup. Will try again later.

## 2020-07-13 NOTE — Telephone Encounter (Signed)
Called and spoke with pt and pt states he is taking 5units in the morning and 5 before dinner already.

## 2020-07-14 NOTE — Telephone Encounter (Signed)
Informed patient to hold his Plavix prior to procedure. Patient voiced understanding.

## 2020-07-18 NOTE — Telephone Encounter (Signed)
Patient has been notified via mychart message due to his voicemail not being setup when we attempt to contact him.

## 2020-07-24 ENCOUNTER — Telehealth: Payer: Self-pay | Admitting: Gastroenterology

## 2020-07-24 ENCOUNTER — Telehealth: Payer: Self-pay | Admitting: Cardiology

## 2020-07-24 NOTE — Telephone Encounter (Signed)
Pt is requesting a call back (missed call)  CB [phone]

## 2020-07-24 NOTE — Telephone Encounter (Signed)
Patient is calling and stated that on 07/25/20, he is having a colonoscopy done and forgot whether he should stop taking his plavix or not. Please call to confirm

## 2020-07-24 NOTE — Telephone Encounter (Signed)
Able to finally reach the patient this afternoon after 5 calls. We discussed Plavix and he made mistake of not holding appropriately. He does not want to have to undergo another procedure if possible so we will postpone tomorrow's Colonoscopy. Please call back on Tuesday on Home or Mobile and get him rescheduled later this year. I am OK to begin using 730 AM slots or 430PM slots if we need to fit patients in. Thank you. GM

## 2020-07-24 NOTE — Telephone Encounter (Signed)
Patty, Please reach out to patient and let him know that since his Plavix has not been stopped I would not be able to remove most polyps safely.  Biopsies could be obtained if needed however.  In this particular case, he can be rescheduled for his procedure if he wishes when he has been off Plavix for 5-days, or if he wants to have procedure done to ensure we are not missing something but understands he may need another procedure if we find something that we cannot remove, then I am OK with proceeding tomorrow with slightly increased risk of bleeding.  Let me know what he decides. Thank you. GM

## 2020-07-24 NOTE — Telephone Encounter (Signed)
Informed pt that he is cleared for colonoscopy he states that he has not stopped his plavix. He will call and let them know

## 2020-07-24 NOTE — Telephone Encounter (Signed)
Good morning!  Patient states he didn't stop taking PLAVIX until 07/24/20.[name] Patient scheduled for colon 07/25/20 9:30am.. Dr. Rush Landmark..  Please advise,    Thank you

## 2020-07-24 NOTE — Telephone Encounter (Signed)
Tried to return pt call and line went straight to a message that said no voice mail has been set up.  Will try later

## 2020-07-25 ENCOUNTER — Encounter: Payer: Self-pay | Admitting: Gastroenterology

## 2020-07-25 NOTE — Telephone Encounter (Signed)
I called the pt and left a message on his voicemail to call back to reschedule his colon at his convenience.

## 2020-08-21 ENCOUNTER — Other Ambulatory Visit: Payer: Self-pay | Admitting: Gastroenterology

## 2020-08-21 LAB — SARS CORONAVIRUS 2 (TAT 6-24 HRS): SARS Coronavirus 2: NEGATIVE

## 2020-08-22 ENCOUNTER — Encounter: Payer: Self-pay | Admitting: Gastroenterology

## 2020-08-22 ENCOUNTER — Other Ambulatory Visit: Payer: Self-pay

## 2020-08-22 ENCOUNTER — Ambulatory Visit (AMBULATORY_SURGERY_CENTER): Payer: Self-pay | Admitting: Gastroenterology

## 2020-08-22 ENCOUNTER — Telehealth: Payer: Self-pay | Admitting: *Deleted

## 2020-08-22 VITALS — BP 117/60 | HR 92 | Temp 98.9°F | Resp 10 | Ht 69.0 in | Wt 318.0 lb

## 2020-08-22 DIAGNOSIS — K222 Esophageal obstruction: Secondary | ICD-10-CM

## 2020-08-22 DIAGNOSIS — D125 Benign neoplasm of sigmoid colon: Secondary | ICD-10-CM

## 2020-08-22 DIAGNOSIS — D127 Benign neoplasm of rectosigmoid junction: Secondary | ICD-10-CM

## 2020-08-22 DIAGNOSIS — K319 Disease of stomach and duodenum, unspecified: Secondary | ICD-10-CM

## 2020-08-22 DIAGNOSIS — K2970 Gastritis, unspecified, without bleeding: Secondary | ICD-10-CM

## 2020-08-22 DIAGNOSIS — D126 Benign neoplasm of colon, unspecified: Secondary | ICD-10-CM

## 2020-08-22 DIAGNOSIS — D123 Benign neoplasm of transverse colon: Secondary | ICD-10-CM

## 2020-08-22 DIAGNOSIS — K449 Diaphragmatic hernia without obstruction or gangrene: Secondary | ICD-10-CM

## 2020-08-22 DIAGNOSIS — K641 Second degree hemorrhoids: Secondary | ICD-10-CM

## 2020-08-22 DIAGNOSIS — D128 Benign neoplasm of rectum: Secondary | ICD-10-CM

## 2020-08-22 DIAGNOSIS — D124 Benign neoplasm of descending colon: Secondary | ICD-10-CM

## 2020-08-22 DIAGNOSIS — R195 Other fecal abnormalities: Secondary | ICD-10-CM

## 2020-08-22 DIAGNOSIS — K219 Gastro-esophageal reflux disease without esophagitis: Secondary | ICD-10-CM

## 2020-08-22 MED ORDER — SODIUM CHLORIDE 0.9 % IV SOLN: 500.0000 mL | Freq: Once | INTRAVENOUS | Status: DC

## 2020-08-22 NOTE — Progress Notes (Signed)
Report given to PACU, vss 

## 2020-08-22 NOTE — Op Note (Signed)
[facility] Patient Name: Gabriel [name] Procedure Date: 08/22/2020 11:05 AM MRN: [id number] Endoscopist: Justice Britain , MD Age: 54 Referring MD:  Date of Birth: November 15, 1965 Gender: Male Account #: [id number] Procedure:                Upper GI endoscopy Indications:              Heartburn, Gastro-esophageal reflux disease, Occult                            blood in stool, Screening for Barrett's esophagus                            in patient at risk for this condition Medicines:                Monitored Anesthesia Care Procedure:                Pre-Anesthesia Assessment:                           - Prior to the procedure, a History and Physical                            was performed, and patient medications and                            allergies were reviewed. The patient's tolerance of                            previous anesthesia was also reviewed. The risks                            and benefits of the procedure and the sedation                            options and risks were discussed with the patient.                            All questions were answered, and informed consent                            was obtained. Prior Anticoagulants: The patient                            last took Plavix (clopidogrel) 5 days prior to the                            procedure and has taken no previous anticoagulant                            or antiplatelet agents except for NSAID medication.                            ASA Grade Assessment: III - A patient with severe  systemic disease. After reviewing the risks and                            benefits, the patient was deemed in satisfactory                            condition to undergo the procedure.                           After obtaining informed consent, the endoscope was                            passed under direct vision. Throughout the                            procedure, the  patient's blood pressure, pulse, and                            oxygen saturations were monitored continuously. The                            Endoscope was introduced through the mouth, and                            advanced to the second part of duodenum. The upper                            GI endoscopy was accomplished without difficulty.                            The patient tolerated the procedure. Scope In: Scope Out: Findings:                 No gross lesions were noted in the proximal                            esophagus and in the mid esophagus.                           Islands of salmon-colored mucosa were present at 40                            cm. No other visible abnormalities were present.                            Biopsies were taken with a cold forceps for                            histology to rule out Barrett's.                           The Z-line was irregular and was found 41 cm from  the incisors.                           A widely patent and non-obstructing Schatzki ring                            was found at the gastroesophageal junction.                           A 3 cm hiatal hernia was present.                           Patchy mildly erythematous mucosa without bleeding                            was found in the entire examined stomach.                           No other gross lesions were noted in the entire                            examined stomach. Biopsies were taken with a cold                            forceps for histology and Helicobacter pylori                            testing.                           No gross lesions were noted in the duodenal bulb,                            in the first portion of the duodenum and in the                            second portion of the duodenum. Complications:            No immediate complications. Estimated Blood Loss:     Estimated blood loss was minimal. Impression:                - No gross lesions in esophagus proximally.                            Salmon-colored mucosa distally - biopsied to rule                            out Barrett's.                           - Z-line irregular, 41 cm from the incisors.                           - Widely patent and non-obstructing Schatzki ring.                           -  3 cm hiatal hernia.                           - Erythematous mucosa in the stomach. No other                            gross lesions in the stomach. Biopsied                           - No gross lesions in the duodenal bulb, in the                            first portion of the duodenum and in the second                            portion of the duodenum. Recommendation:           - Proceed to scheduled colonoscopy.                           - Observe patient's clinical course.                           - Await pathology results.                           - Continue current medications.                           - The findings and recommendations were discussed                            with the patient. Justice Britain, MD 08/22/2020 11:59:36 AM

## 2020-08-22 NOTE — Progress Notes (Signed)
Called to room to assist during endoscopic procedure.  Patient ID and intended procedure confirmed with present staff. Received instructions for my participation in the procedure from the performing physician.  

## 2020-08-22 NOTE — Progress Notes (Signed)
VS by CW. ?

## 2020-08-22 NOTE — Op Note (Signed)
[facility] Patient Name: Gabriel [name] Procedure Date: 08/22/2020 11:05 AM MRN: [id number] Endoscopist: Justice Britain , MD Age: 54 Referring MD:  Date of Birth: 06-17-1966 Gender: Male Account #: [id number] Procedure:                Colonoscopy Indications:              Screening for colorectal malignant neoplasm,                            Incidental - Heme positive stool Medicines:                Monitored Anesthesia Care Procedure:                Pre-Anesthesia Assessment:                           - Prior to the procedure, a History and Physical                            was performed, and patient medications and                            allergies were reviewed. The patient's tolerance of                            previous anesthesia was also reviewed. The risks                            and benefits of the procedure and the sedation                            options and risks were discussed with the patient.                            All questions were answered, and informed consent                            was obtained. Prior Anticoagulants: The patient                            last took Plavix (clopidogrel) 5 days prior to the                            procedure and has taken no previous anticoagulant                            or antiplatelet agents except for aspirin. ASA                            Grade Assessment: III - A patient with severe                            systemic disease. After reviewing the risks and  benefits, the patient was deemed in satisfactory                            condition to undergo the procedure.                           After obtaining informed consent, the colonoscope                            was passed under direct vision. Throughout the                            procedure, the patient's blood pressure, pulse, and                            oxygen saturations were monitored  continuously. The                            Colonoscope was introduced through the anus and                            advanced to the the cecum, identified by                            appendiceal orifice and ileocecal valve. The                            colonoscopy was performed without difficulty. The                            patient tolerated the procedure. The quality of the                            bowel preparation was adequate. The ileocecal                            valve, appendiceal orifice, and rectum were                            photographed. Scope In: 11:23:25 AM Scope Out: 11:52:30 AM Scope Withdrawal Time: 0 hours 23 minutes 7 seconds  Total Procedure Duration: 0 hours 29 minutes 5 seconds  Findings:                 The digital rectal exam findings include                            hemorrhoids. Pertinent negatives include no                            palpable rectal lesions.                           A large amount of liquid semi-liquid stool was  found in the entire colon, interfering with                            visualization. Lavage of the area was performed                            using copious amounts, resulting in clearance with                            adequate visualization.                           Seven sessile polyps were found in the rectum,                            recto-sigmoid colon, sigmoid colon, descending                            colon, transverse colon and hepatic flexure. The                            polyps were 2 to 6 mm in size. These polyps were                            removed with a cold snare. Resection and retrieval                            were complete.                           Normal mucosa was found in the entire colon                            otherwise.                           Non-bleeding non-thrombosed external and internal                            hemorrhoids were found  during retroflexion, during                            perianal exam and during digital exam. The                            hemorrhoids were Grade II (internal hemorrhoids                            that prolapse but reduce spontaneously). Complications:            No immediate complications. Estimated Blood Loss:     Estimated blood loss was minimal. Impression:               - Hemorrhoids found on digital rectal exam.                           -  Stool in the entire examined colon.                           - Seven 2 to 6 mm polyps in the rectum, at the                            recto-sigmoid colon, in the sigmoid colon, in the                            descending colon, in the transverse colon and at                            the hepatic flexure, removed with a cold snare.                            Resected and retrieved.                           - Normal mucosa in the entire examined colon                            otherwise.                           - Non-bleeding non-thrombosed external and internal                            hemorrhoids. Recommendation:           - The patient will be observed post-procedure,                            until all discharge criteria are met.                           - Discharge patient to home.                           - Patient has a contact number available for                            emergencies. The signs and symptoms of potential                            delayed complications were discussed with the                            patient. Return to normal activities tomorrow.                            Written discharge instructions were provided to the                            patient.                           -  High fiber diet.                           - Use FiberCon 1-2 tablets PO daily.                           - May restart Plavix in 48 hours (12/16 PM) to                            decrease post-interventional bleeding  risks.                           - Continue present medications otherwise.                           - Await pathology results.                           - Repeat colonoscopy in 3 years for surveillance                            with a 2-day preparation.                           - The findings and recommendations were discussed                            with the patient. Justice Britain, MD 08/22/2020 12:04:09 PM

## 2020-08-22 NOTE — Progress Notes (Signed)
Robinul 0.2 mg IV given due large amount of secretions upon assessment.  MD made aware, vss 

## 2020-08-22 NOTE — Progress Notes (Signed)
1120 Ephedrine 10 mg given IV due to low BP, MD updated.  

## 2020-08-22 NOTE — Progress Notes (Signed)
1133 HR > 100 with esmolol 25 mg given IV, MD updated, vss

## 2020-08-22 NOTE — Patient Instructions (Signed)
Information on polyps, hiatal hernia, gastritis and hemorrhoids given to you today.  Await pathology results.  Restart Plavix in 48 hours (on Dec 16).  Continue present diet and medications.  Eat a high fiber diet.  Use FiberCon 1-2 tablets by mouth each day.  Repeat colonoscopy in 3 years.  YOU HAD AN ENDOSCOPIC PROCEDURE TODAY AT [facility]:   Refer to the procedure report that was given to you for any specific questions about what was found during the examination.  If the procedure report does not answer your questions, please call your gastroenterologist to clarify.  If you requested that your care partner not be given the details of your procedure findings, then the procedure report has been included in a sealed envelope for you to review at your convenience later.  YOU SHOULD EXPECT: Some feelings of bloating in the abdomen. Passage of more gas than usual.  Walking can help get rid of the air that was put into your GI tract during the procedure and reduce the bloating. If you had a lower endoscopy (such as a colonoscopy or flexible sigmoidoscopy) you may notice spotting of blood in your stool or on the toilet paper. If you underwent a bowel prep for your procedure, you may not have a normal bowel movement for a few days.  Please Note:  You might notice some irritation and congestion in your nose or some drainage.  This is from the oxygen used during your procedure.  There is no need for concern and it should clear up in a day or so.  SYMPTOMS TO REPORT IMMEDIATELY:   Following lower endoscopy (colonoscopy or flexible sigmoidoscopy):  Excessive amounts of blood in the stool  Significant tenderness or worsening of abdominal pains  Swelling of the abdomen that is new, acute  Fever of 100F or higher   Following upper endoscopy (EGD)  Vomiting of blood or coffee ground material  New chest pain or pain under the shoulder blades  Painful or persistently difficult  swallowing  New shortness of breath  Fever of 100F or higher  Black, tarry-looking stools  For urgent or emergent issues, a gastroenterologist can be reached at any hour by calling [phone]. Do not use MyChart messaging for urgent concerns.    DIET:  We do recommend a small meal at first, but then you may proceed to your regular diet.  Drink plenty of fluids but you should avoid alcoholic beverages for 24 hours.  ACTIVITY:  You should plan to take it easy for the rest of today and you should NOT DRIVE or use heavy machinery until tomorrow (because of the sedation medicines used during the test).    FOLLOW UP: Our staff will call the number listed on your records 48-72 hours following your procedure to check on you and address any questions or concerns that you may have regarding the information given to you following your procedure. If we do not reach you, we will leave a message.  We will attempt to reach you two times.  During this call, we will ask if you have developed any symptoms of COVID 19. If you develop any symptoms (ie: fever, flu-like symptoms, shortness of breath, cough etc.) before then, please call [phone].  If you test positive for Covid 19 in the 2 weeks post procedure, please call and report this information to [place].    If any biopsies were taken you will be contacted by phone or by letter within the next 1-3  weeks.  Please call us at [phone] if you have not heard about the biopsies in 3 weeks.    SIGNATURES/CONFIDENTIALITY: You and/or your care partner have signed paperwork which will be entered into your electronic medical record.  These signatures attest to the fact that that the information above on your After Visit Summary has been reviewed and is understood.  Full responsibility of the confidentiality of this discharge information lies with you and/or your care-partner.

## 2020-08-24 ENCOUNTER — Telehealth: Payer: Self-pay

## 2020-08-24 NOTE — Telephone Encounter (Signed)
LVM

## 2020-08-24 NOTE — Telephone Encounter (Signed)
First post procedure follow up call, no answer 

## 2020-08-28 ENCOUNTER — Encounter: Payer: Self-pay | Admitting: Gastroenterology

## 2020-08-28 NOTE — Telephone Encounter (Signed)
Chart entered in error

## 2020-08-29 ENCOUNTER — Telehealth: Payer: Self-pay | Admitting: Gastroenterology

## 2020-08-29 NOTE — Telephone Encounter (Signed)
The pt has been advised that he will be receiving letters with results. I did discuss with him the information and answered his questions. He will await letters

## 2020-08-29 NOTE — Telephone Encounter (Signed)
Pt is requesting a call back from a nurse in regards to his pathology report.

## 2020-09-11 ENCOUNTER — Other Ambulatory Visit: Payer: Self-pay | Admitting: Family Medicine

## 2020-09-12 ENCOUNTER — Ambulatory Visit: Payer: Self-pay | Admitting: Family Medicine

## 2020-09-12 DIAGNOSIS — Z0289 Encounter for other administrative examinations: Secondary | ICD-10-CM

## 2020-09-20 ENCOUNTER — Telehealth: Payer: Self-pay | Admitting: Physician Assistant

## 2020-09-20 ENCOUNTER — Telehealth: Payer: Self-pay | Admitting: *Deleted

## 2020-09-20 NOTE — Telephone Encounter (Signed)
Called Gabriel [name] to go over information prior to virtual visit. Gabriel [name] said he is having increase in heartburn since Sunday, taking Omeprazole 20 mg with some relief. Then Gabriel [name] said he had chest pain this morning and took Nitroglycerin with some relief. He said he was thinking about calling the Cardiologist since he had a stent put in a year ago. Gabriel [name] denies numbness or tingling down left arm. Told Gabriel [name] he needs to go to the ER to be evaluated so they can do EKG and further workup. Gabriel [name] verbalized understanding and said he would go. Aldona Bar was listen to my conversation and agreed Gabriel [name] needs to go to ED.

## 2020-09-20 NOTE — Progress Notes (Signed)
Patient was not seen by me.  After speaking with my LPN, [name], he was instructed to go to the ER for his chest pain.  No charge -- was not seen by me.   Gabriel [name]

## 2020-09-21 ENCOUNTER — Encounter: Payer: Self-pay | Admitting: Physician Assistant

## 2020-09-25 ENCOUNTER — Other Ambulatory Visit: Payer: Self-pay | Admitting: Family Medicine

## 2020-09-26 ENCOUNTER — Other Ambulatory Visit: Payer: Self-pay | Admitting: Family Medicine

## 2020-09-27 ENCOUNTER — Other Ambulatory Visit: Payer: Self-pay | Admitting: Family Medicine

## 2020-10-05 ENCOUNTER — Telehealth: Payer: Self-pay

## 2020-10-05 NOTE — Telephone Encounter (Signed)
Patient is calling in asking if he can take over the counter mucinex, to help with his congestion as he is covid positive.

## 2020-10-05 NOTE — Telephone Encounter (Signed)
Yes thanks-please offer virtual visit as well.  If he declines please inform the following offer ambulatory referral to Covid treatment which would include monoclonal antibody treatment-he is high risk with diabetes, morbid obesity   - recommended patient watch closely for shortness of breath or confusion or worsening symptoms and if those occur patient should contact us immediately or seek care in the emergency department -recommended patient consider purchasing pulse oximeter and if levels 94% or below persistently- seek care at the hospital - Patient needs to self isolate  for at least 10 days since first symptom AND at least 24 hours fever free without fever reducing medications AND have improvement in respiratory symptoms  - he should inform close contacts about exposure (anyone patient been around unmasked for more than 15 minutes) particularly for first 2 days before he developed symptoms in first 3 days of symptoms

## 2020-10-05 NOTE — Telephone Encounter (Signed)
See below

## 2020-10-06 ENCOUNTER — Telehealth (INDEPENDENT_AMBULATORY_CARE_PROVIDER_SITE_OTHER): Payer: HRSA Program | Admitting: Family Medicine

## 2020-10-06 DIAGNOSIS — U071 COVID-19: Secondary | ICD-10-CM

## 2020-10-06 MED ORDER — BENZONATATE 100 MG PO CAPS: 100.0000 mg | ORAL_CAPSULE | Freq: Three times a day (TID) | ORAL | 0 refills | Status: DC | PRN

## 2020-10-06 NOTE — Progress Notes (Signed)
Virtual Visit via Video Note  I connected with Gabriel [name]  on 10/06/20 at  2:20 PM EST by a video enabled telemedicine application and verified that I am speaking with the correct person using two identifiers.  Location patient: home, [place] Location provider:work or home office Persons participating in the virtual visit: patient, provider  I discussed the limitations of evaluation and management by telemedicine and the availability of in person appointments. The patient expressed understanding and agreed to proceed.   HPI:  Acute telemedicine visit for COVID19: -Onset: 4 days ago, positive test yesterday -Symptoms include: drainage in the throat, sore throat, headache, low grade fever the first day, mild SOB - but seems better today, mild diarrhea yesterday -Denies: CP or SOB today, vomiting, inability to eat/drink/get out of bed -girlfriend sick with covid as well -Has tried:zinc, Vit D, Vit C - per [facility] [place] [facility] paper he was given with positive test -Pertinent past medical history: HTN, CAD, DM - reports well controlled, obeity -COVID-19 vaccine status: not vaccinated  ROS: See pertinent positives and negatives per HPI.  Past Medical History:  Diagnosis Date  . Arthritis    "joints; from where I've had surgeries" (08/17/2013)  . Chest pain    a. 08/2013 [facility] CTA: Ca score 238 (95%), LM nl, LAD mod stenosis in D2 area, D1 mild ost stenosis, D2 no signif dzs, LCX small, RCA nl.  . Coronary artery disease    [facility] (08/18/13):  pLAD 50, CFX and RCA normal.  EF 55-65%.  LAD lesion FFR:  0.88 (normal).  Med rx recommended.    [name] Dysphagia    Upper endoscopy 11/2013 - without obvious stricture s/p Maloney dilation.   [name] GERD (gastroesophageal reflux disease)   . Hyperlipidemia    a. Dx 3y ago - not on statin.  [name] Hypertension    a. Dx 3y ago.  [name] Hypothyroidism    a. on replacement.  . IBS (irritable bowel syndrome)    colonoscopy 2000  . Obesity   . Sleep apnea    does not use  CPAP  . Snoring   . Type II diabetes mellitus ([place])     Past Surgical History:  Procedure Laterality Date  . ANKLE SURGERY Right    "had a growth in it; cut it out" (08/17/2013)  . CARDIAC CATHETERIZATION N/A 09/28/2015   Procedure: Left Heart Cath and Coronary Angiography;  Surgeon: Peter M Martinique, MD;  Location: [facility];  Service: Cardiovascular;  Laterality: N/A;  . CHOLECYSTECTOMY    . CORONARY STENT INTERVENTION N/A 10/04/2019   Procedure: CORONARY STENT INTERVENTION;  Surgeon: Jettie Booze, MD;  Location: [facility];  Service: Cardiovascular;  Laterality: N/A;  . INTRAVASCULAR PRESSURE WIRE/FFR STUDY N/A 10/04/2019   Procedure: INTRAVASCULAR PRESSURE WIRE/FFR STUDY;  Surgeon: Jettie Booze, MD;  Location: [facility];  Service: Cardiovascular;  Laterality: N/A;  . KNEE ARTHROSCOPY Right X 2  . LEFT HEART CATH AND CORONARY ANGIOGRAPHY N/A 10/04/2019   Procedure: LEFT HEART CATH AND CORONARY ANGIOGRAPHY;  Surgeon: Jettie Booze, MD;  Location: [facility];  Service: Cardiovascular;  Laterality: N/A;  . LEFT HEART CATHETERIZATION WITH CORONARY ANGIOGRAM N/A 08/18/2013   Procedure: LEFT HEART CATHETERIZATION WITH CORONARY ANGIOGRAM;  Surgeon: Peter M Martinique, MD;  Location: [facility];  Service: Cardiovascular;  Laterality: N/A;  . SHOULDER ARTHROSCOPY W/ ROTATOR CUFF REPAIR Left      Current Outpatient Medications:  .  benzonatate (TESSALON PERLES)  100 MG capsule, Take 1 capsule (100 mg total) by mouth 3 (three) times daily as needed., Disp: 20 capsule, Rfl: 0 .  acetaminophen (TYLENOL) 500 MG tablet, Take 1,000 mg by mouth every 6 (six) hours as needed for mild pain or moderate pain. Reported on 01/09/2016, Disp: , Rfl:  .  aspirin EC 81 MG EC tablet, Take 1 tablet (81 mg total) by mouth daily., Disp: , Rfl:  .  atorvastatin (LIPITOR) 80 MG tablet, Take 1 tablet (80 mg total) by mouth daily at 6 PM., Disp: 90 tablet, Rfl: 3 .   clopidogrel (PLAVIX) 75 MG tablet, Take 1 tablet (75 mg total) by mouth daily., Disp: 90 tablet, Rfl: 3 .  EUTHYROX 125 MCG tablet, TAKE 1 TABLET BY MOUTH ONCE DAILY BEFORE BREAKFAST, Disp: 90 tablet, Rfl: 0 .  glimepiride (AMARYL) 4 MG tablet, TAKE 2 TABLETS BY MOUTH ONCE DAILY BEFORE BREAKFAST, Disp: 180 tablet, Rfl: 0 .  glucose blood (ONE TOUCH TEST STRIPS) test strip, Up to 4 times daily.  Onetouch Verio.  E11.65, Disp: 100 each, Rfl: 12 .  insulin NPH Human (NOVOLIN N) 100 UNIT/ML injection, Inject 0.04-0.15 mLs (4-15 Units total) into the skin 2 (two) times daily before a meal., Disp: 10 mL, Rfl: 11 .  Lancets (ONETOUCH ULTRASOFT) lancets, Up to 4 times daily.  Onetouch Verio.  DxE11.65, Disp: 100 each, Rfl: 12 .  losartan (COZAAR) 100 MG tablet, Take 1 tablet (100 mg total) by mouth daily., Disp: 90 tablet, Rfl: 3 .  metFORMIN (GLUCOPHAGE) 1000 MG tablet, TAKE 1 TABLET BY MOUTH TWICE DAILY WITH A MEAL, Disp: 180 tablet, Rfl: 0 .  Multiple Vitamins-Minerals (MULTIVITAMIN,TX-MINERALS) tablet, Take 1 tablet by mouth daily., Disp: , Rfl:  .  nitroGLYCERIN (NITROSTAT) 0.4 MG SL tablet, Place 1 tablet (0.4 mg total) under the tongue every 5 (five) minutes as needed for chest pain (CP or SOB)., Disp: 25 tablet, Rfl: 3 .  Omega-3 Fatty Acids (FISH OIL) 1000 MG CAPS, Take 1,000 mg by mouth every other day. , Disp: , Rfl:  .  omeprazole (PRILOSEC) 20 MG capsule, Take 1 capsule (20 mg total) by mouth daily., Disp: 30 capsule, Rfl: 3  Current Facility-Administered Medications:  .  0.9 %  sodium chloride infusion, 500 mL, Intravenous, Once, Mansouraty, Telford Nab., MD  EXAMTonette Bihari per patient if applicable:  GENERAL: alert, oriented, appears well and in no acute distress  HEENT: atraumatic, conjunttiva clear, no obvious abnormalities on inspection of external nose and ears  NECK: normal movements of the head and neck  LUNGS: on inspection no signs of respiratory distress, breathing rate  appears normal, no obvious gross SOB, gasping or wheezing  CV: no obvious cyanosis  MS: moves all visible extremities without noticeable abnormality  PSYCH/NEURO: pleasant and cooperative, no obvious depression or anxiety, speech and thought processing grossly intact  ASSESSMENT AND PLAN:  Discussed the following assessment and plan:  COVID-19 - Plan: Ambulatory referral for Covid Treatment  -we discussed possible serious and likely etiologies, options for evaluation and workup, limitations of telemedicine visit vs in person visit, treatment, treatment risks and precautions. Pt prefers to treat via telemedicine empirically rather than in person at this moment.  Discussed treatment options, potential complications, isolation and precautions.  He is unvaccinated, currently is having mild symptoms and has a number of health conditions that put him at higher risk for complications of Covid.  Discussed treatment options at length and he did request a referral to the outpatient  treatment center through [facility].  Referral placed.  I did note in the referral that he has a number of health conditions and is on a number of medications, and requested that they do a careful review to ensure no contraindications or interactions depending on the treatment they have available.  Advised patient that he also discussed this with the treatment center if they do contact him.  Did let him know that there is a shortage of treatment doses available and that they will contact people based on triage to the highest risk individuals.  He opted for Tessalon for cough, nasal saline and other care measures summarized in patient instructions. Work/School slipped offered:  declined Scheduled follow up with PCP offered: He agrees to schedule follow-up through his PCP office if needed. Advised to seek prompt in person care if worsening, new symptoms arise, or if is not improving with treatment. Discussed options for inperson  care if PCP office not available. Did let this patient know that I only do telemedicine on Tuesdays and Thursdays for [place]. Advised to schedule follow up visit with PCP or [facility] if any further questions or concerns to avoid delays in care.   I discussed the assessment and treatment plan with the patient. The patient was provided an opportunity to ask questions and all were answered. The patient agreed with the plan and demonstrated an understanding of the instructions.     Lucretia Kern, DO

## 2020-10-06 NOTE — Telephone Encounter (Signed)
Please schedule virtual for pt. 

## 2020-10-06 NOTE — Telephone Encounter (Signed)
LVM asking patient to call back.  

## 2020-10-06 NOTE — Patient Instructions (Addendum)
  HOME CARE TIPS:  -I sent the medication(s) we discussed to your pharmacy: Meds ordered this encounter  Medications  . benzonatate (TESSALON PERLES) 100 MG capsule    Sig: Take 1 capsule (100 mg total) by mouth 3 (three) times daily as needed.    Dispense:  20 capsule    Refill:  0     -I sent a message to the outpatient Covid treatment center letting them know that you are interested in treatment. Currently there are very limited treatment doses available and the are calling the folks that are at the highest risk of hospitalization or severe disease first.   -can use tylenol or aleve if needed for fevers, aches and pains per instructions  -can use nasal saline a few times per day if you have nasal congestion  -stay hydrated, drink plenty of fluids and eat small healthy meals - avoid dairy  -can take 1000 IU (29mcg) Vit D3 and 100-500 mg of Vit C daily per instructions  -If the Covid test is positive, check out the CDC website for more information on home care, transmission and treatment for COVID19  -follow up with your doctor in 2-3 days unless improving and feeling better  -stay home while sick, except to seek medical care, and if you have [place] ideally it would be best to stay home for a full 10 days since the onset of symptoms PLUS one day of no fever and feeling better. Wear a good mask (such as N95 or KN95) if around others to reduce the risk of transmission.  It was nice to meet you today, and I really hope you are feeling better soon. I help [place] [facility] out with telemedicine visits on Tuesdays and Thursdays and am available for visits on those days. If you have any concerns or questions following this visit please schedule a follow up visit with your Primary Care doctor or seek care at a local urgent care clinic to avoid delays in care.    Seek in person care or schedule a follow up video visit promptly if your symptoms worsen, new concerns arise or you are not improving with  treatment. Call 911 and/or seek emergency care if your symptoms are severe or life threatening.

## 2020-10-08 ENCOUNTER — Telehealth: Payer: Self-pay | Admitting: Adult Health

## 2020-10-08 NOTE — Telephone Encounter (Signed)
Team please check on him Monday and encourage him to follow up with outpatient treatment program

## 2020-10-08 NOTE — Telephone Encounter (Signed)
Called to discuss with patient about COVID-19 symptoms and the use of one of the available treatments for those with mild to moderate Covid symptoms and at a high risk of hospitalization.  Pt appears to qualify for outpatient treatment due to co-morbid conditions and/or a member of an at-risk group in accordance with the FDA Emergency Use Authorization.    Symptom onset: ?  Vaccinated: NO  Booster?No  Immunocompromised? No  Qualifiers: Yes   Unable to reach pt - Unable to reach, left vm to call hotline back.   Rexene Edison NP -C

## 2020-10-09 NOTE — Telephone Encounter (Signed)
Called and lm for pt tcb. 

## 2020-10-10 ENCOUNTER — Telehealth: Payer: Self-pay | Admitting: Cardiology

## 2020-10-10 NOTE — Telephone Encounter (Signed)
    COVID-19 Pre-Screening Questions V2.:  . In the past 7 to 10 days have you had a cough,  shortness of breath, headache, congestion, fever (100 or greater) body aches, chills, sore throat, or sudden loss of taste or sense of smell,? . Have you been around anyone with known Covid 19, or who is waiting for a Covid test result and is symptomatic?   For patients who are Covid+, pending results or exposed in the last 5 days WITH SYMPTOMS:  1.       Offer to change appointment to a [place] appointment. If the patient declines, contact covering nurse or triage so it can be determined if patient needs to be seen or     rescheduled. (assumes patient calls ahead)  2.       If a patient presents in the lobby, ask them to return to their car and the nurse will call them. Obtain the correct cell phone number and message the covering nurse. The nurse will triage the patient and discuss with the provider to determine appropriate visit plan (In office, MyChart or reschedule).   3.       If it is determined the patient needs to be seen in the office, arrangements will be made for the patient to be seen in a remote room with minimal touches. (Location will be         specific to each [facility] site).               If you have any concerns/questions about symptoms patients report during screening (either on the phone or at threshold),                Send a secure chat with the patient's chart to the APP doing preop clearances for that day.              If the decision is made to see the patient, document the following in the appt. note:  "cleared by APP's initials".                        If an APP is not available contact a member of the leadership team.   Patients who are Covid+ are allowed in the office when the following are met: 1. No symptoms or improving symptoms  2. At least 5 days post positive test     Patient tested positive 10 days ago with no symptoms.

## 2020-10-10 NOTE — Progress Notes (Deleted)
Cardiology Office Note   Date:  10/10/2020   ID:  Gabriel [name], DOB 12-08-1965, MRN [id number]  PCP:  Marin Olp, MD  Cardiologist: Dr. Percival Spanish  No chief complaint on file.    History of Present Illness: Gabriel [name] is a 55 y.o. male who presents for  ongoing assessment and management of CAD, cardiac cath on 10/04/2019 revealed LAD stenosis of 60%, with FFR of 0.78.  DES was placed using a Synergy XD 3.0 x 20 stent.  LVEF was 55 to 65%.  He was placed on dual antiplatelet therapy with aspirin and Plavix, along with high and intensity statin.  Other history includes OSA on CPAP, hypertension, hyperlipidemia, hypothyroidism, morbid obesity, GERD and type 2 diabetes.  The patient had sleep study on 12/09/2019 which was read by Dr. Golden Hurter.  This revealed severe obstructive sleep apnea, and was recommended for CPAP titration.  Last seen by me on 01/17/2020.  At that time he was not as active as he normally like to be due to some mild dyspnea, he denied any bleeding or melena.  He was finishing up cardiac rehab.  No changes were made in his medication regimen.   Past Medical History:  Diagnosis Date  . Arthritis    "joints; from where I've had surgeries" (08/17/2013)  . Chest pain    a. 08/2013 [facility] CTA: Ca score 238 (95%), LM nl, LAD mod stenosis in D2 area, D1 mild ost stenosis, D2 no signif dzs, LCX small, RCA nl.  . Coronary artery disease    [facility] (08/18/13):  pLAD 50, CFX and RCA normal.  EF 55-65%.  LAD lesion FFR:  0.88 (normal).  Med rx recommended.    [name] Dysphagia    Upper endoscopy 11/2013 - without obvious stricture s/p Maloney dilation.   [name] GERD (gastroesophageal reflux disease)   . Hyperlipidemia    a. Dx 3y ago - not on statin.  [name] Hypertension    a. Dx 3y ago.  [name] Hypothyroidism    a. on replacement.  . IBS (irritable bowel syndrome)    colonoscopy 2000  . Obesity   . Sleep apnea    does not use CPAP  . Snoring   . Type II diabetes mellitus ([place])      Past Surgical History:  Procedure Laterality Date  . ANKLE SURGERY Right    "had a growth in it; cut it out" (08/17/2013)  . CARDIAC CATHETERIZATION N/A 09/28/2015   Procedure: Left Heart Cath and Coronary Angiography;  Surgeon: Peter M Martinique, MD;  Location: [facility];  Service: Cardiovascular;  Laterality: N/A;  . CHOLECYSTECTOMY    . CORONARY STENT INTERVENTION N/A 10/04/2019   Procedure: CORONARY STENT INTERVENTION;  Surgeon: Jettie Booze, MD;  Location: [facility];  Service: Cardiovascular;  Laterality: N/A;  . INTRAVASCULAR PRESSURE WIRE/FFR STUDY N/A 10/04/2019   Procedure: INTRAVASCULAR PRESSURE WIRE/FFR STUDY;  Surgeon: Jettie Booze, MD;  Location: [facility];  Service: Cardiovascular;  Laterality: N/A;  . KNEE ARTHROSCOPY Right X 2  . LEFT HEART CATH AND CORONARY ANGIOGRAPHY N/A 10/04/2019   Procedure: LEFT HEART CATH AND CORONARY ANGIOGRAPHY;  Surgeon: Jettie Booze, MD;  Location: [facility];  Service: Cardiovascular;  Laterality: N/A;  . LEFT HEART CATHETERIZATION WITH CORONARY ANGIOGRAM N/A 08/18/2013   Procedure: LEFT HEART CATHETERIZATION WITH CORONARY ANGIOGRAM;  Surgeon: Peter M Martinique, MD;  Location: [facility];  Service: Cardiovascular;  Laterality: N/A;  .  SHOULDER ARTHROSCOPY W/ ROTATOR CUFF REPAIR Left      Current Outpatient Medications  Medication Sig Dispense Refill  . acetaminophen (TYLENOL) 500 MG tablet Take 1,000 mg by mouth every 6 (six) hours as needed for mild pain or moderate pain. Reported on 01/09/2016    . aspirin EC 81 MG EC tablet Take 1 tablet (81 mg total) by mouth daily.    [name] atorvastatin (LIPITOR) 80 MG tablet Take 1 tablet (80 mg total) by mouth daily at 6 PM. 90 tablet 3  . benzonatate (TESSALON PERLES) 100 MG capsule Take 1 capsule (100 mg total) by mouth 3 (three) times daily as needed. 20 capsule 0  . clopidogrel (PLAVIX) 75 MG tablet Take 1 tablet (75 mg total) by mouth daily. 90 tablet  3  . EUTHYROX 125 MCG tablet TAKE 1 TABLET BY MOUTH ONCE DAILY BEFORE BREAKFAST 90 tablet 0  . glimepiride (AMARYL) 4 MG tablet TAKE 2 TABLETS BY MOUTH ONCE DAILY BEFORE BREAKFAST 180 tablet 0  . glucose blood (ONE TOUCH TEST STRIPS) test strip Up to 4 times daily.  Onetouch Verio.  E11.65 100 each 12  . insulin NPH Human (NOVOLIN N) 100 UNIT/ML injection Inject 0.04-0.15 mLs (4-15 Units total) into the skin 2 (two) times daily before a meal. 10 mL 11  . Lancets (ONETOUCH ULTRASOFT) lancets Up to 4 times daily.  Onetouch Verio.  DxE11.65 100 each 12  . losartan (COZAAR) 100 MG tablet Take 1 tablet (100 mg total) by mouth daily. 90 tablet 3  . metFORMIN (GLUCOPHAGE) 1000 MG tablet TAKE 1 TABLET BY MOUTH TWICE DAILY WITH A MEAL 180 tablet 0  . Multiple Vitamins-Minerals (MULTIVITAMIN,TX-MINERALS) tablet Take 1 tablet by mouth daily.    . nitroGLYCERIN (NITROSTAT) 0.4 MG SL tablet Place 1 tablet (0.4 mg total) under the tongue every 5 (five) minutes as needed for chest pain (CP or SOB). 25 tablet 3  . Omega-3 Fatty Acids (FISH OIL) 1000 MG CAPS Take 1,000 mg by mouth every other day.     [name] omeprazole (PRILOSEC) 20 MG capsule Take 1 capsule (20 mg total) by mouth daily. 30 capsule 3   Current Facility-Administered Medications  Medication Dose Route Frequency Provider Last Rate Last Admin  . 0.9 %  sodium chloride infusion  500 mL Intravenous Once Mansouraty, Telford Nab., MD        Allergies:   Patient has no known allergies.    Social History:  The patient  reports that he quit smoking about 13 years ago. His smoking use included cigarettes. He has a 36.00 pack-year smoking history. He has never used smokeless tobacco. He reports that he does not drink alcohol and does not use drugs.   Family History:  The patient's family history includes Asthma in his maternal grandfather; Coronary artery disease in his father; Diabetes in his brother, father, and mother; Hypertension in his father and mother;  Liver cancer in his mother; Obesity in his brother and mother; Stroke in his father. He was adopted.    ROS: All other systems are reviewed and negative. Unless otherwise mentioned in H&P    PHYSICAL EXAM: VS:  There were no vitals taken for this visit. , BMI There is no height or weight on file to calculate BMI. GEN: Well nourished, well developed, in no acute distress HEENT: normal Neck: no JVD, carotid bruits, or masses Cardiac: ***RRR; no murmurs, rubs, or gallops,no edema  Respiratory:  Clear to auscultation bilaterally, normal work of breathing GI: soft,  nontender, nondistended, + BS MS: no deformity or atrophy Skin: warm and dry, no rash Neuro:  Strength and sensation are intact Psych: euthymic mood, full affect   EKG:  EKG {ACTION; IS/IS XTK:[id number]} ordered today. The ekg ordered today demonstrates ***   Recent Labs: 02/14/2020: Hemoglobin 13.1; Hemoglobin 13.1; Platelets 204.0; Platelets 204.0; TSH 2.23 05/30/2020: ALT 35; BUN 11; Creat 0.97; Potassium 4.4; Sodium 137    Lipid Panel    Component Value Date/Time   CHOL 116 02/14/2020 1035   CHOL 118 11/16/2019 1453   TRIG 120.0 02/14/2020 1035   HDL 31.30 (L) 02/14/2020 1035   HDL 34 (L) 11/16/2019 1453   CHOLHDL 4 02/14/2020 1035   VLDL 24.0 02/14/2020 1035   LDLCALC 61 02/14/2020 1035   LDLCALC 64 11/16/2019 1453   LDLDIRECT 68.0 03/11/2019 1427      Wt Readings from Last 3 Encounters:  08/22/20 (!) 318 lb (144.2 kg)  07/11/20 (!) 318 lb (144.2 kg)  05/30/20 (!) 319 lb (144.7 kg)      Other studies Reviewed: Cardiac Cath 10/04/2019  Mid LAD lesion is 60% stenosed. FFR was 0.78.  A drug-eluting stent was successfully placed using a SYNERGY XD 3.0X20.  Post intervention, there is a 0% residual stenosis.  The left ventricular systolic function is normal.  LV end diastolic pressure is normal. LVEDP 14 mm Hg.  The left ventricular ejection fraction is 55-65% by visual estimate.  There is no aortic  valve stenosis.  Single vessel CAD. Continue aspirin and Brilinta for 12 months. If Brilinta is too expensive, could switch to clopidogrel after 1 month.  Echocardiogram 10/03/2019 . Left ventricular ejection fraction, by visual estimation, is 55 to  60%. The left ventricle has normal function. Unable to assess for left  ventricular hypertrophy.  2. Definity contrast agent was given IV to delineate the left ventricular  endocardial borders.  3. Left ventricular diastolic parameters are indeterminate.  4. The left ventricle has no regional wall motion abnormalities.  5. Global right ventricle has normal systolic function.The right  ventricular size is mildly enlarged. No increase in right ventricular wall  thickness.  6. Left atrial size was normal.  7. Right atrial size was normal.  8. The mitral valve is grossly normal. No evidence of mitral valve  regurgitation.  9. The tricuspid valve is grossly normal.  10. The tricuspid valve is grossly normal. Tricuspid valve regurgitation  is not demonstrated.  11. The aortic valve is tricuspid. Aortic valve regurgitation is not  visualized. No evidence of aortic valve sclerosis or stenosis.  12. The pulmonic valve was not well visualized. Pulmonic valve  regurgitation is not visualized.  13. The inferior vena cava is dilated in size with >50% respiratory  variability, suggesting right atrial pressure of 8 mmHg.  14. The aortic root was not well visualized.    ASSESSMENT AND PLAN:  1.  ***   Current medicines are reviewed at length with the patient today.  I have spent *** dedicated to the care of this patient on the date of this encounter to include pre-visit review of records, assessment, management and diagnostic testing,with shared decision making.  Labs/ tests ordered today include: *** Phill Myron. West Pugh, ANP, AACC   10/10/2020 2:41 PM    [facility] [place] [address] Office  [phone] Fax [phone]  Notice: This dictation was prepared with Dragon dictation along with smaller phrase technology. Any transcriptional errors that result from this process are  unintentional and may not be corrected upon review.

## 2020-10-10 NOTE — Telephone Encounter (Signed)
Spoke with patient and he tested positive 10 days ago Denies fever or cough, only some congestion and symptoms improving Advised ok to come to visit 2/4, call if any changes in how is feeling to discuss further

## 2020-10-11 NOTE — Patient Instructions (Addendum)
  Depression screen [facility] 2/9 02/22/2020 11/02/2019 08/10/2019  Decreased Interest 0 0 0  Down, Depressed, Hopeless 0 0 0  PHQ - 2 Score 0 0 0    Recommended follow up: No follow-ups on file.

## 2020-10-11 NOTE — Progress Notes (Signed)
Phone [phone] Virtual visit via Video note   Subjective:  Chief complaint: Chief Complaint  Patient presents with  . Covid Positive    Patient states that he's not improving. Pt states that he's been running a fever (99.3) Patient is having some vomiting. Fatigue   . Medication Refill    Nitroglycerin     This visit type was conducted due to national recommendations for restrictions regarding the COVID-19 Pandemic (e.g. social distancing).  This format is felt to be most appropriate for this patient at this time balancing risks to patient and risks to population by having him in for in person visit.  No physical exam was performed (except for noted visual exam or audio findings with Telehealth visits).    Our team/I connected with [name] at  1:00 PM EST by a video enabled telemedicine application (doxy.me or caregility through epic) and verified that I am speaking with the correct person using two identifiers.  Location patient: [facility] Location provider: [facility], office Persons participating in the virtual visit:  patient  Our team/I discussed the limitations of evaluation and management by telemedicine and the availability of in person appointments. In light of current covid-19 pandemic, patient also understands that we are trying to protect them by minimizing in office contact if at all possible.  The patient expressed consent for telemedicine visit and agreed to proceed. Patient understands insurance will be billed.   Past Medical History-  Patient Active Problem List   Diagnosis Date Noted  . Coronary Artery Disease 08/26/2013    Priority: High  . Diabetes mellitus without complication ([place]) 42/59/5638    Priority: High  . 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 Morbid obesity ([place]) 08/10/2013    Priority: Medium  . Essential hypertension 02/06/2011    Priority: Medium  . Hypothyroidism 02/06/2011    Priority: Medium  .  Hyperlipidemia associated with type 2 diabetes mellitus ([place]) 02/06/2011    Priority: Medium  . Colon cancer screening 10/27/2018    Priority: Low  . Midepigastric pain 09/27/2018    Priority: Low  . Change in bowel habits 09/27/2018    Priority: Low  . Family hx colonic polyps 09/27/2018    Priority: Low  . BRBPR (bright red blood per rectum) 09/27/2018    Priority: Low  . Left lateral epicondylitis 03/18/2016    Priority: Low  . GERD (gastroesophageal reflux disease) 02/01/2015    Priority: Low  . Dysphagia 09/30/2013    Priority: Low    Medications- reviewed and updated Current Outpatient Medications  Medication Sig Dispense Refill  . acetaminophen (TYLENOL) 500 MG tablet Take 1,000 mg by mouth every 6 (six) hours as needed for mild pain or moderate pain. Reported on 01/09/2016    . aspirin EC 81 MG EC tablet Take 1 tablet (81 mg total) by mouth daily.    [name] atorvastatin (LIPITOR) 80 MG tablet Take 1 tablet (80 mg total) by mouth daily at 6 PM. 90 tablet 3  . benzonatate (TESSALON PERLES) 100 MG capsule Take 1 capsule (100 mg total) by mouth 3 (three) times daily as needed. 20 capsule 0  . budesonide (PULMICORT) 180 MCG/ACT inhaler Inhale 2 puffs into the lungs in the morning and at bedtime. 1 each 0  . clopidogrel (PLAVIX) 75 MG tablet Take 1 tablet (75 mg total) by mouth daily. 90 tablet 3  . EUTHYROX 125 MCG tablet TAKE 1 TABLET BY MOUTH ONCE DAILY BEFORE BREAKFAST 90  tablet 0  . glimepiride (AMARYL) 4 MG tablet TAKE 2 TABLETS BY MOUTH ONCE DAILY BEFORE BREAKFAST 180 tablet 0  . glucose blood (ONE TOUCH TEST STRIPS) test strip Up to 4 times daily.  Onetouch Verio.  E11.65 100 each 12  . insulin NPH Human (NOVOLIN N) 100 UNIT/ML injection Inject 0.04-0.15 mLs (4-15 Units total) into the skin 2 (two) times daily before a meal. 10 mL 11  . Lancets (ONETOUCH ULTRASOFT) lancets Up to 4 times daily.  Onetouch Verio.  DxE11.65 100 each 12  . losartan (COZAAR) 100 MG tablet Take 1 tablet  (100 mg total) by mouth daily. 90 tablet 3  . metFORMIN (GLUCOPHAGE) 1000 MG tablet TAKE 1 TABLET BY MOUTH TWICE DAILY WITH A MEAL 180 tablet 0  . Multiple Vitamins-Minerals (MULTIVITAMIN,TX-MINERALS) tablet Take 1 tablet by mouth daily.    . Omega-3 Fatty Acids (FISH OIL) 1000 MG CAPS Take 1,000 mg by mouth every other day.     [name] omeprazole (PRILOSEC) 20 MG capsule Take 1 capsule (20 mg total) by mouth daily. 30 capsule 3  . nitroGLYCERIN (NITROSTAT) 0.4 MG SL tablet Place 1 tablet (0.4 mg total) under the tongue every 5 (five) minutes as needed for chest pain (CP or SOB). 25 tablet 3   No current facility-administered medications for this visit.     Objective:  BP 124/64   Pulse (!) 108   Temp 99.3 F (37.4 C)  self reported vitals Gen: NAD, resting comfortably, no distress in voice      Assessment and Plan   COVID not improving S: patient had video visit with Dr. Maudie Mercury on 10/06/20 and first day of symptoms was 25th. [facility] [place] [facility] test. Dr. Maudie Mercury treated with tessalon for cough, nasal saline. He was referred to outpatient treatment center with covid.   Symptoms included drainage in throat, sore throat, headache, low grade fever, mild sob which was improving at last visit, mild diarrhea. Had tried zinc, vitamin D, vitamin C per [facility].   He is unvaccinated from covid 19   Patient feels as if he is not improving. Temperature has been running in 99s for most part. Having a lot of fatigue and some vomiting (started today with vitamin D gummy- no vomiting outside of the gummy). Still intermittent loose stools. Feels more winded than he did before. Has had increased joint pain with covid  Sugar running higher at 222 today.   Girlfriend has covid as well. Dad may have covid as well   Tessalon has helped some with cough A/P: Patient with testing confirming covid 19 with first day of covid 19 symptoms  January  25th Therefore: - recommended patient watch closely for shortness of breath or confusion or worsening symptoms and if those occur patient should contact us immediately or seek care in the emergency department- since he is already somewhat short of breath we discussed if worsens simply call 911 -recommended patient consider purchasing pulse oximeter and if levels 94% or below persistently- seek care at the hospital - Patient needs to self isolate  for at least 10 days since first symptom AND at least 24 hours fever free without fever reducing medications AND have improvement in respiratory symptoms  - earliest possible day out of self isolation 10/14/20 but needs significant improvement in respiratory symptoms and I would prefer temperature under 99 for 24 hours - work note  Not needed - he should inform close contacts  about exposure (anyone patient been around unmasked for more than 15 minutes)  -also asked him to stay well hydrated -he has already been referred to outpatient covid program- I asked him to call to try to get set up (they left voicemail). If he cannot get set up with them consider molnupiravir: 800 mg every 12 hours for 5 days; - we discussed with covid increases chance of blood clots and if progressive shortness of breath needs immediate evaluation  # CAD- no chest pain but would like refill on nitroglycerin- this was provided. I do not think shortness of breath is cardiac related but instead related to covid 19.  -continue plavix, aspirin, statin  #hypertension S: medication: losartan 100mg  BP Readings from Last 3 Encounters:  10/12/20 124/64  08/22/20 117/60  07/11/20 124/64  A/P: well controlled and he is staying well hydrated so I dont think we have to stop the losartan right now.   #diabetes- doing 7 units twice a day of nph plus metformin twice daily and glimepiride. Sugars running higher in 200s for most part even though intake has been lower. We discussed short term could  do 8 units but certainly step back down if sugars trend back down  Recommended follow up:  As needed for acute concerns- I would like for him to update me if not improving by Monday or certainly seek care if symptoms worsen Future Appointments  Date Time Provider [place]  10/13/2020 11:45 AM Lendon Colonel, NP [facility]   Lab/Order associations:   ICD-10-CM   1. COVID-19  U07.1   2. Atherosclerosis of native coronary artery of native heart without angina pectoris  I25.10   3. Essential hypertension  I10   4. Diabetes mellitus without complication (HCC)  [id number]     Meds ordered this encounter  Medications  . budesonide (PULMICORT) 180 MCG/ACT inhaler    Sig: Inhale 2 puffs into the lungs in the morning and at bedtime.    Dispense:  1 each    Refill:  0  . nitroGLYCERIN (NITROSTAT) 0.4 MG SL tablet    Sig: Place 1 tablet (0.4 mg total) under the tongue every 5 (five) minutes as needed for chest pain (CP or SOB).    Dispense:  25 tablet    Refill:  3   Time Spent: 30 minutes of total time (1:02 PM- 1:32 PM) was spent on the date of the encounter performing the following actions: chart review prior to seeing the patient, obtaining history, performing a medically necessary exam, counseling on the treatment plan, placing orders, and documenting in our EHR.   Return precautions advised.  Garret Reddish, MD

## 2020-10-12 ENCOUNTER — Encounter: Payer: Self-pay | Admitting: Family Medicine

## 2020-10-12 ENCOUNTER — Telehealth (INDEPENDENT_AMBULATORY_CARE_PROVIDER_SITE_OTHER): Payer: HRSA Program | Admitting: Family Medicine

## 2020-10-12 VITALS — BP 124/64 | HR 108 | Temp 99.3°F

## 2020-10-12 DIAGNOSIS — U071 COVID-19: Secondary | ICD-10-CM | POA: Diagnosis not present

## 2020-10-12 DIAGNOSIS — I2510 Atherosclerotic heart disease of native coronary artery without angina pectoris: Secondary | ICD-10-CM | POA: Diagnosis not present

## 2020-10-12 DIAGNOSIS — E119 Type 2 diabetes mellitus without complications: Secondary | ICD-10-CM | POA: Diagnosis not present

## 2020-10-12 DIAGNOSIS — I10 Essential (primary) hypertension: Secondary | ICD-10-CM

## 2020-10-12 MED ORDER — BUDESONIDE 180 MCG/ACT IN AEPB: 2.0000 | INHALATION_SPRAY | Freq: Two times a day (BID) | RESPIRATORY_TRACT | 0 refills | Status: DC

## 2020-10-12 MED ORDER — NITROGLYCERIN 0.4 MG SL SUBL: 0.4000 mg | SUBLINGUAL_TABLET | SUBLINGUAL | 3 refills | Status: DC | PRN

## 2020-10-13 ENCOUNTER — Ambulatory Visit: Payer: Self-pay | Admitting: Adult Health

## 2020-10-13 ENCOUNTER — Ambulatory Visit (INDEPENDENT_AMBULATORY_CARE_PROVIDER_SITE_OTHER): Payer: HRSA Program | Admitting: Medical

## 2020-10-13 VITALS — BP 122/73 | HR 96 | Temp 98.6°F | Wt 306.0 lb

## 2020-10-13 DIAGNOSIS — U071 COVID-19: Secondary | ICD-10-CM | POA: Diagnosis not present

## 2020-10-13 DIAGNOSIS — R059 Cough, unspecified: Secondary | ICD-10-CM | POA: Diagnosis not present

## 2020-10-13 DIAGNOSIS — I10 Essential (primary) hypertension: Secondary | ICD-10-CM

## 2020-10-13 DIAGNOSIS — R0602 Shortness of breath: Secondary | ICD-10-CM

## 2020-10-13 DIAGNOSIS — R112 Nausea with vomiting, unspecified: Secondary | ICD-10-CM | POA: Diagnosis not present

## 2020-10-13 DIAGNOSIS — E119 Type 2 diabetes mellitus without complications: Secondary | ICD-10-CM

## 2020-10-13 DIAGNOSIS — R5383 Other fatigue: Secondary | ICD-10-CM | POA: Diagnosis not present

## 2020-10-13 MED ORDER — ONDANSETRON HCL 4 MG PO TABS: 4.0000 mg | ORAL_TABLET | Freq: Three times a day (TID) | ORAL | 0 refills | Status: DC | PRN

## 2020-10-13 MED ORDER — PREDNISONE 10 MG PO TABS
ORAL_TABLET | ORAL | 0 refills | Status: DC
Start: 2020-10-13 — End: 2020-12-19

## 2020-10-13 NOTE — Progress Notes (Signed)
[facility]   Subjective:  Gabriel [name] is a 55 y.o. male who presents for respiratory illness.    PCP: Marin Olp, MD  He reports starting symptoms January 25 9 days ago and tested positive on the 25th as well.  He was initially seen at an urgent care.  Symptoms initially included cough, headache, nausea and vomiting, vomiting x1 episode, drainage, some shortness of breath, decreased appetite, some discomfort in the chest.  He used Imodium which has helped the loose stool.  He mainly vomited after taking a vitamin D supplement which he didn't think sit well with him.  He felt like he was significantly improving after the first week but now seems to be getting worse again.  His main symptoms is a little short of breath and fatigue.  He still has cough but the cough isn't terrible.  He was prescribed Tessalon Perles and is using them some.  He is using Tylenol for aches.  He is still taking zinc and vitamin C.  He called his primary care provider who called out the Tessalon Perles and Pulmicort inhaler.  Unfortunately pharmacy did not have the Pulmicort and he can pick it up till Monday and it would be $95.  He is a diabetic.  His blood sugars have been relatively controlled.  No other aggravating or relieving factors.  No other c/o.  Past Medical History:  Diagnosis Date  . Arthritis    "joints; from where I've had surgeries" (08/17/2013)  . Chest pain    a. 08/2013 Cardia CTA: Ca score 238 (95%), LM nl, LAD mod stenosis in D2 area, D1 mild ost stenosis, D2 no signif dzs, LCX small, RCA nl.  . Coronary artery disease    LHC (08/18/13):  pLAD 50, CFX and RCA normal.  EF 55-65%.  LAD lesion FFR:  0.88 (normal).  Med rx recommended.    [name] Dysphagia    Upper endoscopy 11/2013 - without obvious stricture s/p Maloney dilation.   [name] GERD (gastroesophageal reflux disease)   . Hyperlipidemia    a. Dx 3y ago - not on statin.  [name] Hypertension    a. Dx 3y ago.  [name]  Hypothyroidism    a. on replacement.  . IBS (irritable bowel syndrome)    colonoscopy 2000  . Obesity   . Sleep apnea    does not use CPAP  . Snoring   . Type II diabetes mellitus (HCC)     ROS as in subjective   Objective: BP 122/73 (BP Location: Left Arm, Patient Position: Sitting, Cuff Size: Large)   Pulse 96   Temp 98.6 F (37 C) (Oral)   Wt (!) 306 lb (138.8 kg)   SpO2 95%   BMI 45.19 kg/m   General appearance: Alert, WD/WN, no distress, mildly ill appearing                             Skin: warm, no rash                           Head: no sinus tenderness                            Eyes: conjunctiva normal, corneas clear, PERRLA  Ears: pearly TMs, external ear canals normal                          Nose: septum midline, turbinates without erythema and no discharge             Mouth/throat: somewhat dry MM, tongue normal, no pharyngeal erythema                           Neck: supple, no adenopathy, no thyromegaly, non tender                          Heart: RRR, normal S1, S2, no murmurs                         Lungs: Generalized decreased breath sounds in the lower fields, some crackles, otherwise no wheezes, no rhonchi                         Ext: no calve asymmetry, no lower extremity swelling, negative homans       Assessment  Encounter Diagnoses  Name Primary?  . COVID-19 virus infection Yes  . Cough   . Nausea and vomiting, intractability of vomiting not specified, unspecified vomiting type   . Fatigue, unspecified type   . SOB (shortness of breath)   . Morbid obesity ([place])   . Essential hypertension   . Diabetes mellitus without complication ([place])       Plan: We will check some labs, he will go for chest x-ray tomorrow at [facility]  We discussed that he needs to continue good hydration  We discussed other recommendations as below  Begin Albuterol rescue inhaler 1-2 puffs three times daily or 2 puffs every 4-6  hours for shortness of breath, wheezing or cough fits.  We gave him a sample inhaler  Begin prednisone oral medication to reduce inflammation.  This will increase your sugars for the next week or so  Begin Zofran I sent to pharmacy for nausea as needed  Continue Tessalon Perles as needed for cough  I expect you to gradually improve over the next week  We will use the oral prednisone instead of the Pulmicort inhaler since it would be a lot more expensive and isn't even available for 3 more days  If much worse in the coming days go to the emergency department.  Otherwise I advise he do a follow-up with the post-COVID clinic or his primary care provider next week    Navier was seen today for covid positive.  Diagnoses and all orders for this visit:  COVID-19 virus infection -     Basic metabolic panel -     CBC with Differential/Platelet -     DG Chest 2 View; Future  Cough -     Basic metabolic panel -     CBC with Differential/Platelet -     DG Chest 2 View; Future  Nausea and vomiting, intractability of vomiting not specified, unspecified vomiting type -     Basic metabolic panel -     CBC with Differential/Platelet -     DG Chest 2 View; Future  Fatigue, unspecified type -     Basic metabolic panel -     CBC with Differential/Platelet -     DG Chest 2 View; Future  SOB (shortness of  breath)  Morbid obesity ([place])  Essential hypertension  Diabetes mellitus without complication ([place])  Other orders -     ondansetron (ZOFRAN) 4 MG tablet; Take 1 tablet (4 mg total) by mouth every 8 (eight) hours as needed for nausea or vomiting. -     predniSONE (DELTASONE) 10 MG tablet; 6 tablets day 1, 5 tablets day 2, 4 tablets day 3, 3 tablets day 4, 2 tablets day 5, 1 tablet day 6     Patient voiced understanding of diagnosis, recommendations, and treatment plan.  After visit summary given.

## 2020-10-13 NOTE — Patient Instructions (Addendum)
Recommendations:  Begin Albuterol rescue inhaler 1-2 puffs three times daily or 2 puffs every 4-6 hours for shortness of breath, wheezing or cough fits  Begin prednisone oral medication to reduce inflammation.  This will increase your sugars for the next week or so  Begin Zofran I sent to pharmacy for nausea as needed  Continue Tessalon Perles as needed for cough  I expect you to gradually improve over the next week     General recommendations if you have respiratory symptoms: We recommend you rest, hydrate well with water and clear fluids throughout the day such as water, soup broth, ice chips, or possibly pedialyte or G2 no sugar gatorade.   You can use Tylenol over the counter for pain or fever every 4 - 6 hours You can use over the counter Delsym or mucinex DM for cough unless your provider prescribed a cough medication already. You can use over the counter Emetrol over the counter for nausea.    Consider EmergenC Immune plus vitamin pack over the counter which contains extra vitamin C, vitamin D, and zinc.  If you are having trouble breathing, if you are very weak, have high fever 103 or higher consistently despite Tylenol, or uncontrollable nausea and vomiting, then call or go to the emergency department.    Covid symptoms such as fatigue and cough can linger over 2 weeks, even after the initial fever, aches, chills, and other initial symptoms.   Self Quarantine and Isolation: Log onto [employer] for up to date quarantine recommendations.   [email or web address].[email or web address]   If you test Covid +, regardless of vaccination status:  Stay home for 5 days. If you have no symptoms or your symptoms are resolving after 5 days, then you can leave your house. Continue to wear a mask around others for 5 additional days. If you have a fever, continue to stay home until your fever resolves.  This could take 7-10 days from onset of  symptoms or longer in some cases. If you have lots of coughing, sneezing, and significant runny nose and congestion, continue to stay at home until your symptoms are resolving   If you were exposed to someone with Covid: If you: Have been boosted OR Completed the primary series of Pfizer or Moderna vaccine within the last 6 months OR Completed the primary series of J&J vaccine within the last 2 months  Wear a mask around others for 10 days.  Test on day 5, if possible.   If you test positive on day 5 or more after exposure, and no symptoms, then wear a mask around others for 10 days  If you test positive and have symptoms, then follow the positive covid result isolation recommendations above If you test negative and have no symptoms on day 5 after exposure, then you may end isolation and be around others    If you were exposed to someone with Covid: If you: Completed the primary series of Pfizer or Moderna vaccine over 6 months ago and are not boosted OR Completed the primary series of J&J over 2 months ago and are not boosted OR Are unvaccinated  Stay home for 5 days. After that continue to wear a mask around others for 5 additional days. If you can't quarantine you must wear a mask for 10 days. Test on day 5 if possible. If you test positive on day 5 or more after exposure, and no symptoms, then wear a mask around others for 10 days  If you test positive  and have symptoms, then follow the positive covid result isolation recommendations above If you test negative and have no symptoms on day 5 after exposure, then you can end isolation but wear a mask around others for 5 more days   If you test Covid negative, but have respiratory symptoms:  Continue to wear a mask around others for 5 additional days. If you have a fever, continue to stay home until your fever resolves.   If you have lots of coughing, sneezing, and significant runny nose and congestion, continue to stay at  home until your symptoms are resolving   Isolation means avoiding contact with people as much as possible.   Particularly in your house, isolate your self from others in a separate room, wear a mask when possible in the room, particularly if coughing a lot.   Have others bring food, water, medications, etc., to your door, but avoid direct contact with your household contacts during this time to avoid spreading the infection to them.   If you have a separate bathroom and living quarters during the next 2 weeks away from others, that would be preferable.    If you can't completely isolate, then wear a mask, wash hands frequently with soap and water for at least 15 seconds, minimize close contact with others, and have a friend or family member check regularly from a distance to make sure you are not getting seriously worse.     You should not be going out in public, should not be going to stores, to work or other public places until all your symptoms have resolved.  One of the goals is to limit spread to high risk people; people that are older and elderly, people with multiple health issues like diabetes, heart disease, lung disease, and anybody that has weakened immune systems such as people with cancer or on immunosuppressive therapy.

## 2020-10-14 LAB — CBC WITH DIFFERENTIAL/PLATELET
Basophils Absolute: 0 10*3/uL (ref 0.0–0.2)
Basos: 0 %
EOS (ABSOLUTE): 0.1 10*3/uL (ref 0.0–0.4)
Eos: 2 %
Hematocrit: 40.9 % (ref 37.5–51.0)
Hemoglobin: 13.1 g/dL (ref 13.0–17.7)
Immature Grans (Abs): 0 10*3/uL (ref 0.0–0.1)
Immature Granulocytes: 0 %
Lymphocytes Absolute: 1.3 10*3/uL (ref 0.7–3.1)
Lymphs: 40 %
MCH: 27.7 pg (ref 26.6–33.0)
MCHC: 32 g/dL (ref 31.5–35.7)
MCV: 87 fL (ref 79–97)
Monocytes Absolute: 0.3 10*3/uL (ref 0.1–0.9)
Monocytes: 8 %
Neutrophils Absolute: 1.6 10*3/uL (ref 1.4–7.0)
Neutrophils: 50 %
Platelets: 163 10*3/uL (ref 150–450)
RBC: 4.73 x10E6/uL (ref 4.14–5.80)
RDW: 13.1 % (ref 11.6–15.4)
WBC: 3.3 10*3/uL — ABNORMAL LOW (ref 3.4–10.8)

## 2020-10-14 LAB — BASIC METABOLIC PANEL
BUN/Creatinine Ratio: 10 (ref 9–20)
BUN: 10 mg/dL (ref 6–24)
CO2: 25 mmol/L (ref 20–29)
Calcium: 8.4 mg/dL — ABNORMAL LOW (ref 8.7–10.2)
Chloride: 99 mmol/L (ref 96–106)
Creatinine, Ser: 0.99 mg/dL (ref 0.76–1.27)
GFR calc Af Amer: 100 mL/min/{1.73_m2} (ref >59)
GFR calc non Af Amer: 87 mL/min/{1.73_m2} (ref >59)
Glucose: 215 mg/dL — ABNORMAL HIGH (ref 65–99)
Potassium: 4 mmol/L (ref 3.5–5.2)
Sodium: 138 mmol/L (ref 134–144)

## 2020-11-01 IMAGING — DX DG CHEST 2V
2 series · 2 of 2 positions shown · non-contrast
Comparison: 06/25/2019

CLINICAL DATA: Left chest pain, shortness of breath

EXAM:
CHEST - 2 VIEW

[chest pa]
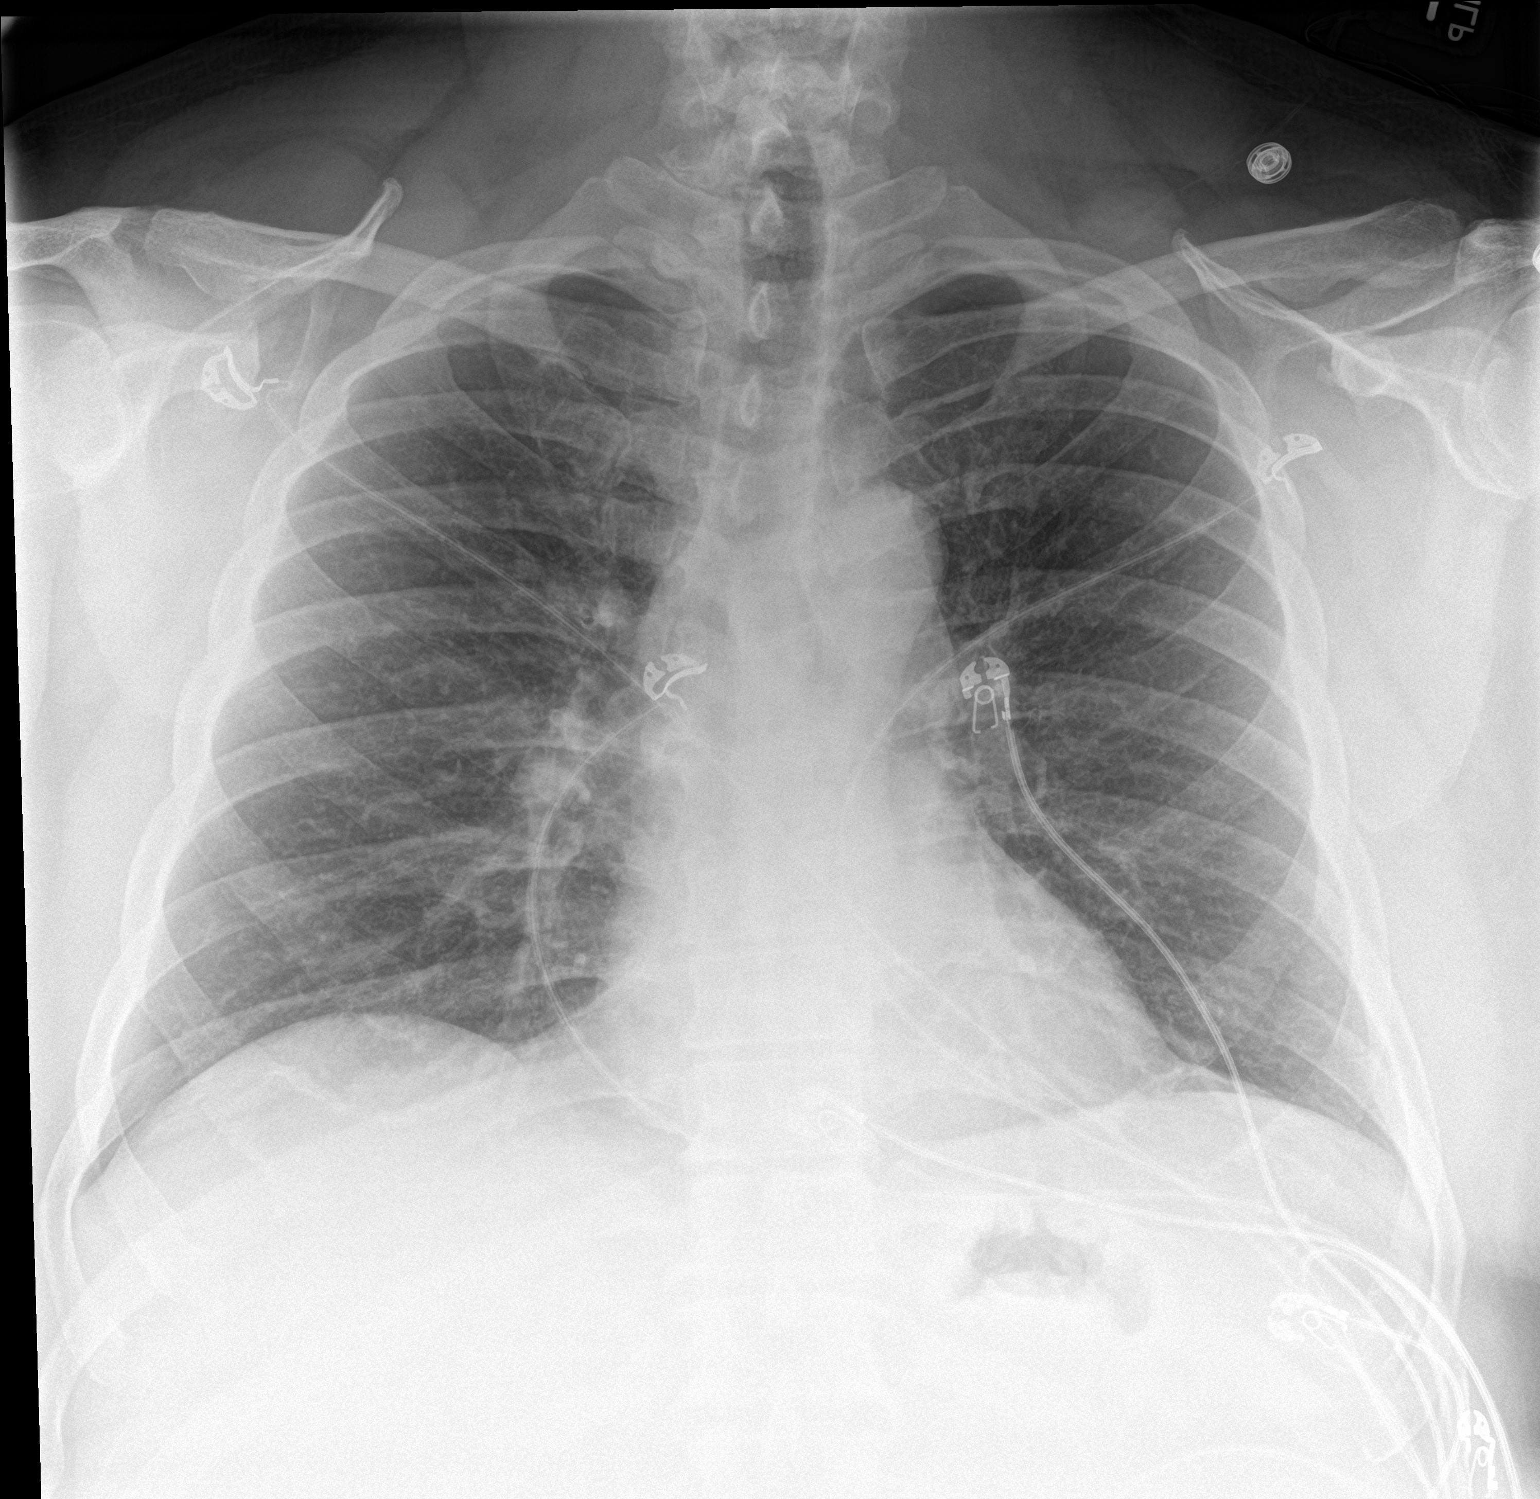

[chest lat]
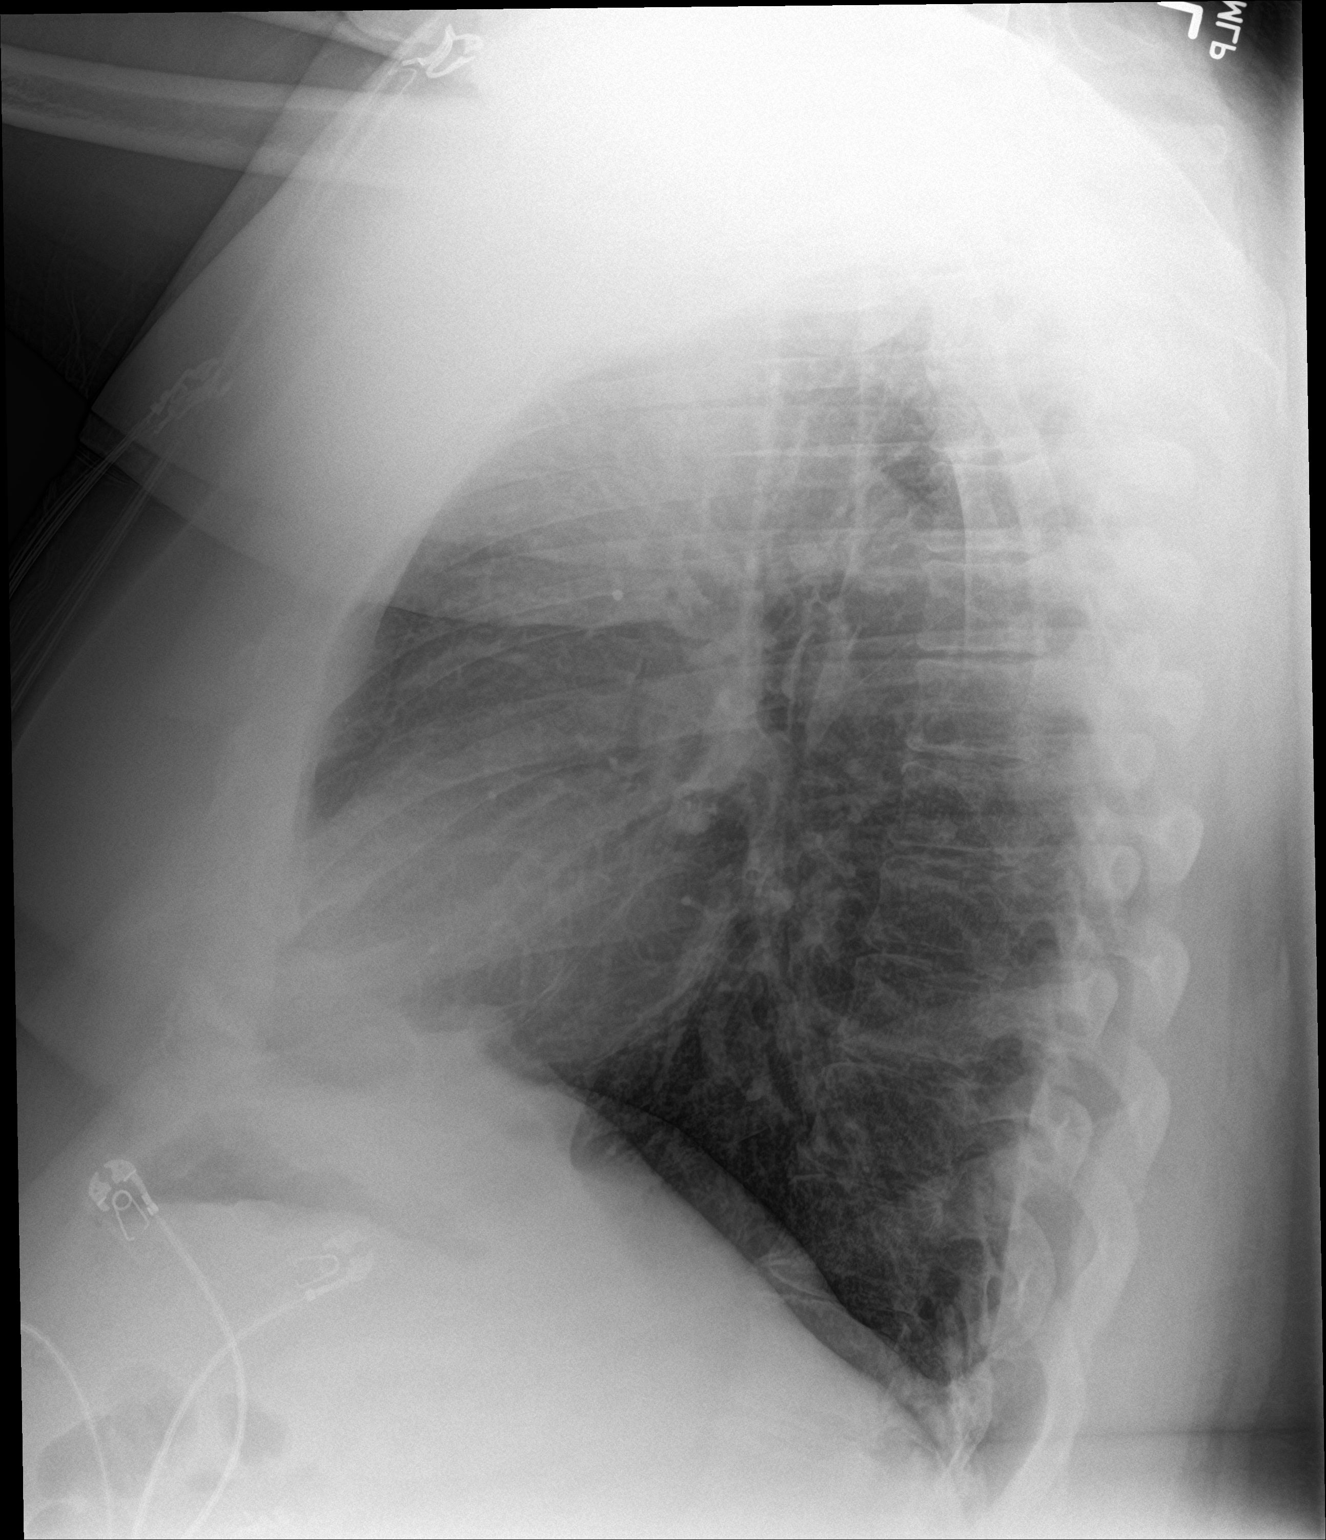

[2 of 2 positions shown; findings below may reference images not displayed]

FINDINGS: Lungs are clear.  No pleural effusion or pneumothorax.

The heart is normal in size.

Visualized osseous structures are within normal limits.
IMPRESSION: Normal chest radiographs.

## 2020-11-01 IMAGING — CT CT CHEST W/O CM
2 of 3 series · 15 of 36 positions shown, 18 images · non-contrast
Comparison: Chest radiographs dated 10/03/2019

CLINICAL DATA: Shortness of breath, left chest pain

EXAM:
CT CHEST WITHOUT CONTRAST
TECHNIQUE: Multidetector CT imaging of the chest was performed following the
standard protocol without IV contrast.

[Series 3: chest w/o 2mm st · axial · non-contrast · 0.91mm/px · z∈[-339,-29]mm · 12 of 183 slices shown, 15 images]
[im 14/183  mediastinal]
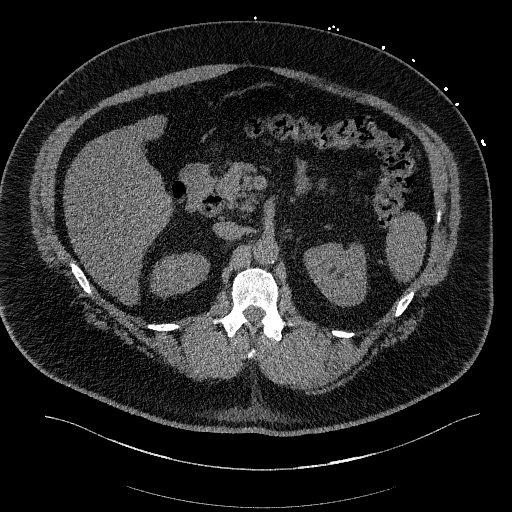
[im 14/183  lung]
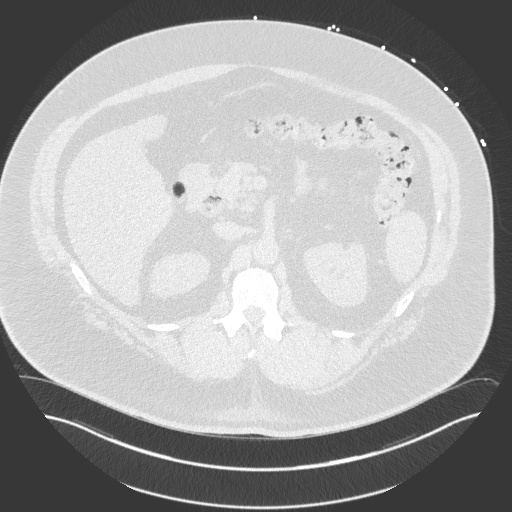
[im 27/183  lung]
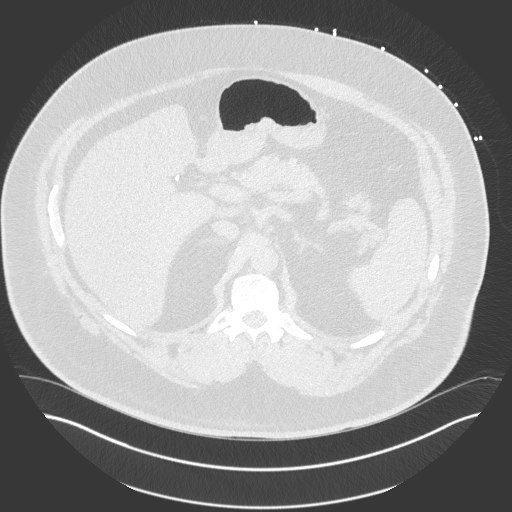
[im 41/183  lung]
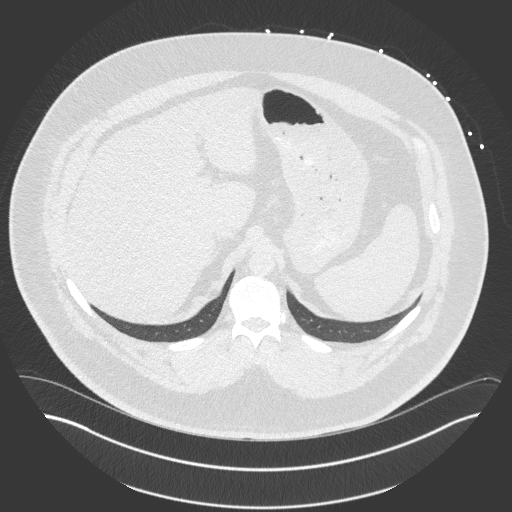
[im 54/183  lung]
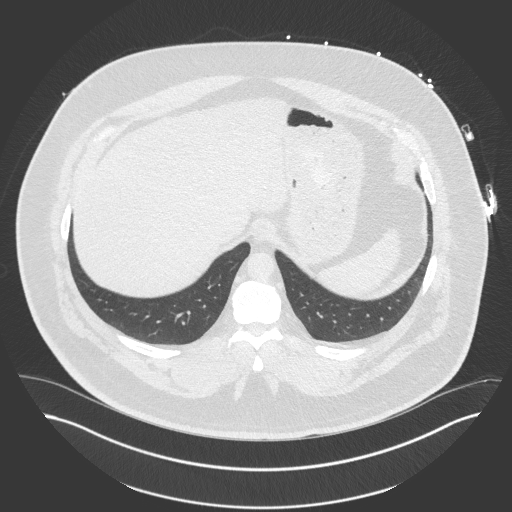
[im 68/183  mediastinal]
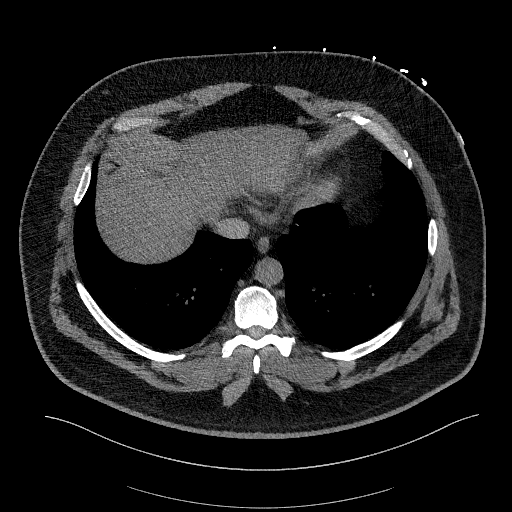
[im 68/183  lung]
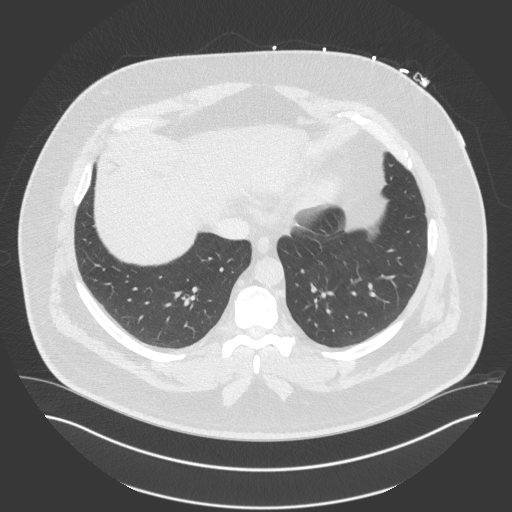
[im 81/183  lung]
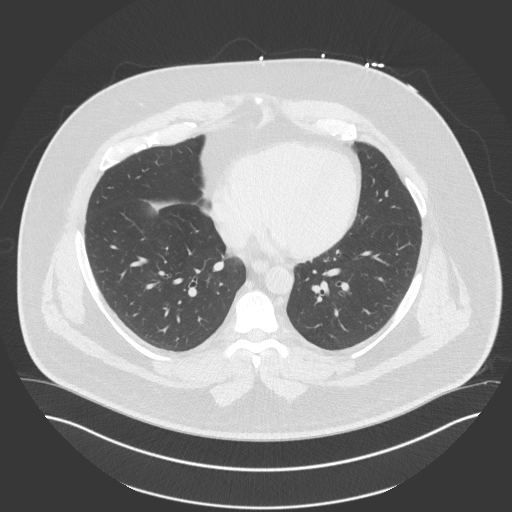
[im 102/183  lung]
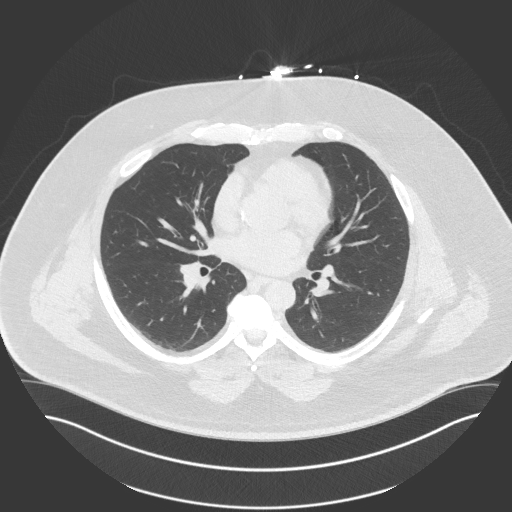
[im 115/183  lung]
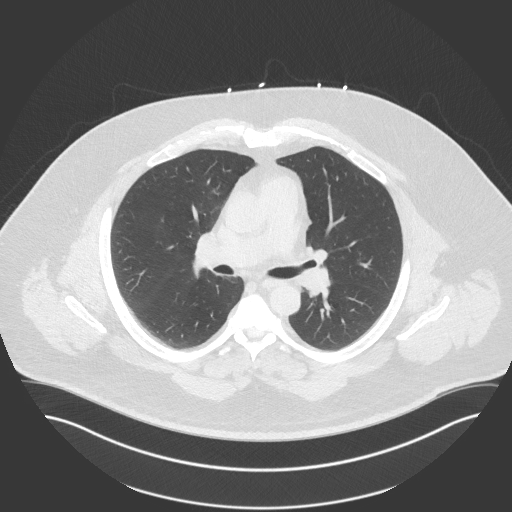
[im 129/183  mediastinal]
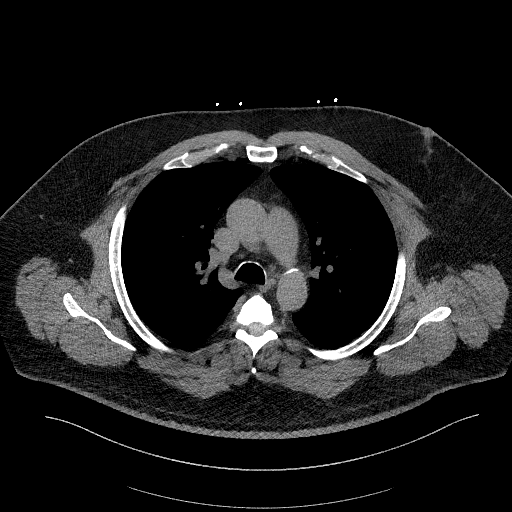
[im 129/183  lung]
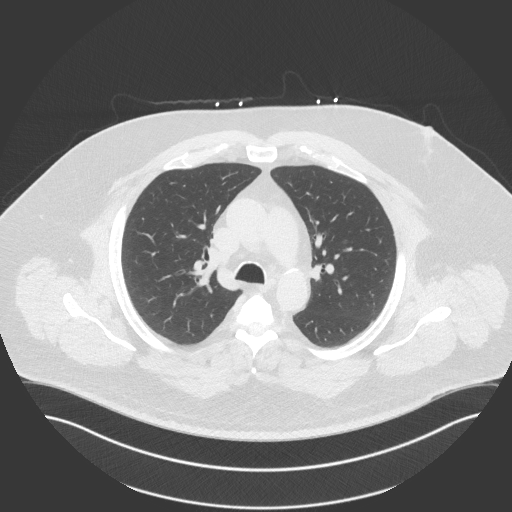
[im 142/183  lung]
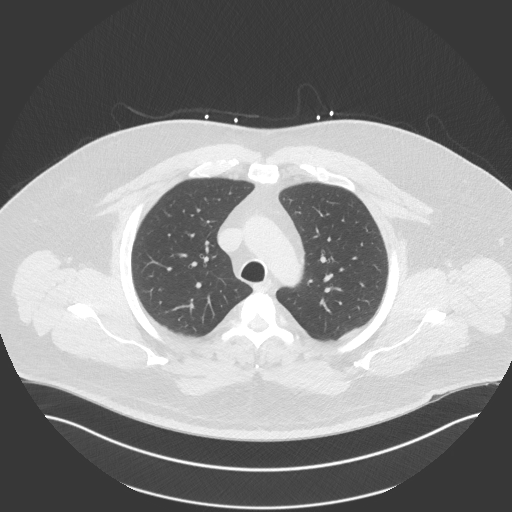
[im 156/183  lung]
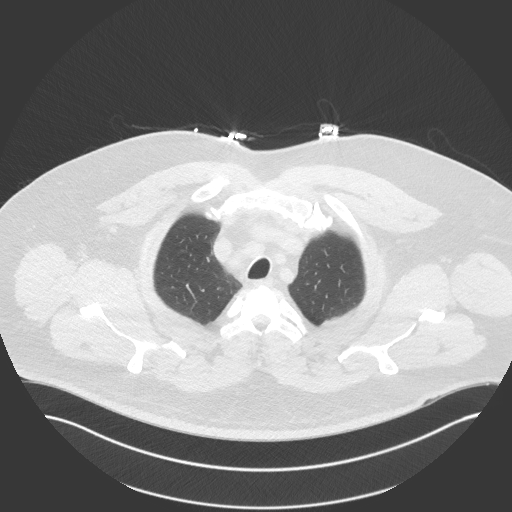
[im 169/183  lung]
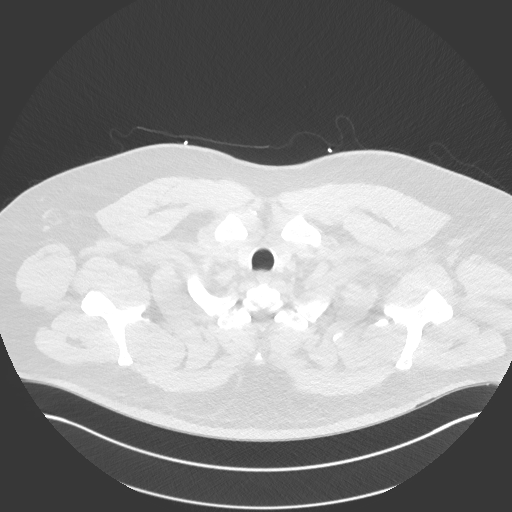

[Series 5: chest w/o 3mm st cor · coronal · non-contrast · 0.71mm/px · 3 of 114 slices shown]
[im 23/114  lung]
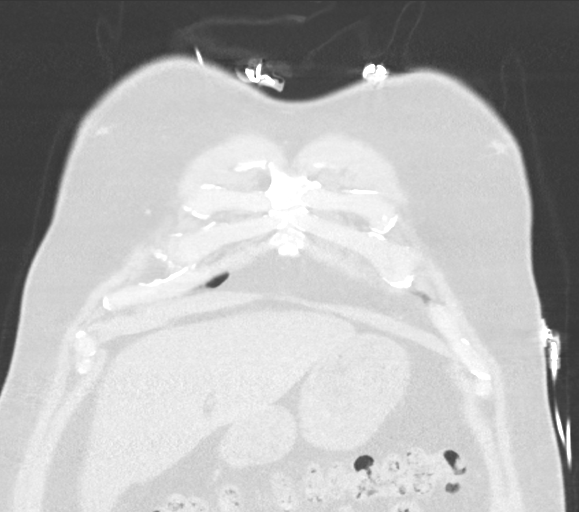
[im 46/114  lung]
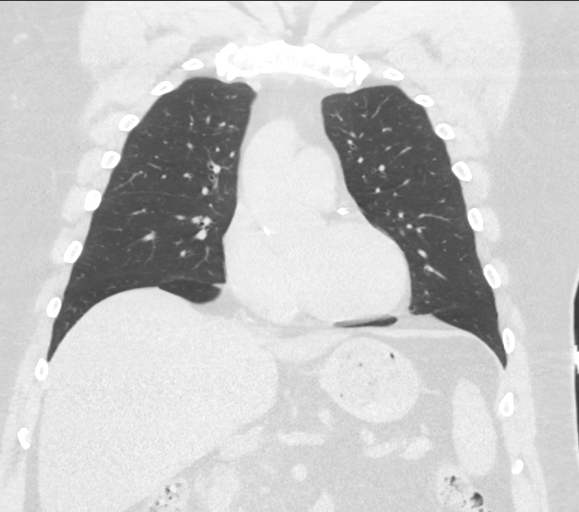
[im 68/114  lung]
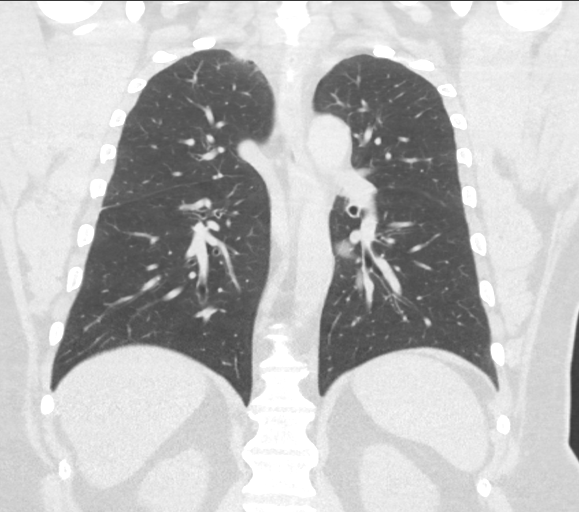

[15 of 36 positions shown; findings below may reference images not displayed]

FINDINGS: Cardiovascular: The heart is normal in size. No pericardial
effusion.

No evidence of thoracic aortic aneurysm. Mild atherosclerotic
calcifications the aortic root/arch.

Coronary atherosclerosis of the LAD.

Mediastinum/Nodes: No suspicious mediastinal lymphadenopathy.

Visualized thyroid is unremarkable.

Lungs/Pleura: No suspicious pulmonary nodules.

No focal consolidation.

No pleural effusion or pneumothorax.

Upper Abdomen: Visualized upper abdomen is notable for hepatic
steatosis and prior cholecystectomy. Small upper abdominal lymph
nodes, including a 14 mm short axis portacaval node (series 3/image
161), likely reactive.

Musculoskeletal: Mild degenerative changes of the visualized
thoracolumbar spine. No rib fracture is seen.
IMPRESSION: No evidence of acute cardiopulmonary disease.

No CT findings to account for the patient's left chest pain.

Aortic Atherosclerosis ([id number]-S8J.J).

## 2020-11-19 ENCOUNTER — Other Ambulatory Visit: Payer: Self-pay | Admitting: Adult Health

## 2020-11-22 ENCOUNTER — Other Ambulatory Visit: Payer: Self-pay | Admitting: Adult Health

## 2020-12-04 ENCOUNTER — Other Ambulatory Visit: Payer: Self-pay | Admitting: Family Medicine

## 2020-12-08 ENCOUNTER — Ambulatory Visit (INDEPENDENT_AMBULATORY_CARE_PROVIDER_SITE_OTHER): Payer: Self-pay | Admitting: Physician Assistant

## 2020-12-08 ENCOUNTER — Telehealth: Payer: Self-pay

## 2020-12-08 ENCOUNTER — Encounter: Payer: Self-pay | Admitting: Physician Assistant

## 2020-12-08 ENCOUNTER — Other Ambulatory Visit: Payer: Self-pay

## 2020-12-08 VITALS — BP 120/60 | HR 92 | Temp 97.3°F | Wt 317.6 lb

## 2020-12-08 DIAGNOSIS — M79605 Pain in left leg: Secondary | ICD-10-CM

## 2020-12-08 MED ORDER — CYCLOBENZAPRINE HCL 10 MG PO TABS: 10.0000 mg | ORAL_TABLET | Freq: Every evening | ORAL | 0 refills | Status: DC | PRN

## 2020-12-08 NOTE — Progress Notes (Signed)
Gabriel [name] is a 55 y.o. male here for a new problem.    History of Present Illness:   Chief Complaint  Patient presents with  . Hip Pain    Left leg and hip pain. Has been going on for about 10 days. Has used tylenol and used biofreeze.     HPI  Left hip pain Went to the beach a few weeks ago.  Went to [facility] which is about a 3-3.5 hour drive.  States that he did stop a few times along the way. Slept in a different bed and is not sure if this may have contributed to his symptoms.  Didn't do a lot of walking while at the beach.  Symptoms started about a week after he got back from the beach. Started in L hip and it is going down the back of leg and in his calf. Symptoms are worse when standing or legs are down.  Has had similar sx in R leg but not this severe.  He has a crampy pain in his left calf that seems to be the most bothersome at this point.  Denies swelling in his legs, prior blood clot, chest pain or shortness of breath at this time.  When he takes Tylenol it is helpful for his symptoms.   Past Medical History:  Diagnosis Date  . Arthritis    "joints; from where I've had surgeries" (08/17/2013)  . Chest pain    a. 08/2013 Cardia CTA: Ca score 238 (95%), LM nl, LAD mod stenosis in D2 area, D1 mild ost stenosis, D2 no signif dzs, LCX small, RCA nl.  . Coronary artery disease    LHC (08/18/13):  pLAD 50, CFX and RCA normal.  EF 55-65%.  LAD lesion FFR:  0.88 (normal).  Med rx recommended.    [name] Dysphagia    Upper endoscopy 11/2013 - without obvious stricture s/p Maloney dilation.   [name] GERD (gastroesophageal reflux disease)   . Hyperlipidemia    a. Dx 3y ago - not on statin.  [name] Hypertension    a. Dx 3y ago.  [name] Hypothyroidism    a. on replacement.  . IBS (irritable bowel syndrome)    colonoscopy 2000  . Obesity   . Sleep apnea    does not use CPAP  . Snoring   . Type II diabetes mellitus (HCC)      Social History   Tobacco Use  . Smoking status: Former  Smoker    Packs/day: 1.50    Years: 24.00    Pack years: 36.00    Types: Cigarettes    Quit date: 09/10/2007    Years since quitting: 13.2  . Smokeless tobacco: Never Used  Vaping Use  . Vaping Use: Never used  Substance Use Topics  . Alcohol use: No  . Drug use: No    Past Surgical History:  Procedure Laterality Date  . ANKLE SURGERY Right    "had a growth in it; cut it out" (08/17/2013)  . CARDIAC CATHETERIZATION N/A 09/28/2015   Procedure: Left Heart Cath and Coronary Angiography;  Surgeon: Peter M Martinique, MD;  Location: [place] [facility];  Service: Cardiovascular;  Laterality: N/A;  . CHOLECYSTECTOMY    . CORONARY STENT INTERVENTION N/A 10/04/2019   Procedure: CORONARY STENT INTERVENTION;  Surgeon: Jettie Booze, MD;  Location: [place] [facility];  Service: Cardiovascular;  Laterality: N/A;  . INTRAVASCULAR PRESSURE WIRE/FFR STUDY N/A 10/04/2019   Procedure: INTRAVASCULAR PRESSURE WIRE/FFR STUDY;  Surgeon:  Jettie Booze, MD;  Location: [place] [facility];  Service: Cardiovascular;  Laterality: N/A;  . KNEE ARTHROSCOPY Right X 2  . LEFT HEART CATH AND CORONARY ANGIOGRAPHY N/A 10/04/2019   Procedure: LEFT HEART CATH AND CORONARY ANGIOGRAPHY;  Surgeon: Jettie Booze, MD;  Location: [place] [facility];  Service: Cardiovascular;  Laterality: N/A;  . LEFT HEART CATHETERIZATION WITH CORONARY ANGIOGRAM N/A 08/18/2013   Procedure: LEFT HEART CATHETERIZATION WITH CORONARY ANGIOGRAM;  Surgeon: Peter M Martinique, MD;  Location: [facility];  Service: Cardiovascular;  Laterality: N/A;  . SHOULDER ARTHROSCOPY W/ ROTATOR CUFF REPAIR Left     Family History  Adopted: Yes  Problem Relation Age of Onset  . Hypertension Mother   . Obesity Mother   . Diabetes Mother   . Liver cancer Mother        with mets to the GB  . Coronary artery disease Father        s/p MI in his early 30's->CABG x 5 @ age 29, currently 45.  . Diabetes Father   . Stroke Father   . Hypertension Father    . Diabetes Brother   . Obesity Brother   . Asthma Maternal Grandfather   . Colon cancer Neg Hx   . Esophageal cancer Neg Hx   . Inflammatory bowel disease Neg Hx   . Liver disease Neg Hx   . Pancreatic cancer Neg Hx   . Rectal cancer Neg Hx   . Stomach cancer Neg Hx     No Known Allergies  Current Medications:   Current Outpatient Medications:  .  acetaminophen (TYLENOL) 500 MG tablet, Take 1,000 mg by mouth every 6 (six) hours as needed for mild pain or moderate pain. Reported on 01/09/2016, Disp: , Rfl:  .  aspirin EC 81 MG EC tablet, Take 1 tablet (81 mg total) by mouth daily., Disp: , Rfl:  .  benzonatate (TESSALON PERLES) 100 MG capsule, Take 1 capsule (100 mg total) by mouth 3 (three) times daily as needed., Disp: 20 capsule, Rfl: 0 .  cyclobenzaprine (FLEXERIL) 10 MG tablet, Take 1 tablet (10 mg total) by mouth at bedtime as needed for muscle spasms., Disp: 14 tablet, Rfl: 0 .  EUTHYROX 125 MCG tablet, TAKE 1 TABLET BY MOUTH ONCE DAILY BEFORE BREAKFAST, Disp: 90 tablet, Rfl: 0 .  glimepiride (AMARYL) 4 MG tablet, TAKE 2 TABLETS BY MOUTH ONCE DAILY BEFORE BREAKFAST, Disp: 180 tablet, Rfl: 0 .  glucose blood (ONE TOUCH TEST STRIPS) test strip, Up to 4 times daily.  Onetouch Verio.  E11.65, Disp: 100 each, Rfl: 12 .  insulin NPH Human (NOVOLIN N) 100 UNIT/ML injection, Inject 0.04-0.15 mLs (4-15 Units total) into the skin 2 (two) times daily before a meal., Disp: 10 mL, Rfl: 11 .  Lancets (ONETOUCH ULTRASOFT) lancets, Up to 4 times daily.  Onetouch Verio.  DxE11.65, Disp: 100 each, Rfl: 12 .  losartan (COZAAR) 100 MG tablet, Take 1 tablet by mouth once daily, Disp: 90 tablet, Rfl: 0 .  metFORMIN (GLUCOPHAGE) 1000 MG tablet, TAKE 1 TABLET BY MOUTH TWICE DAILY WITH A MEAL, Disp: 180 tablet, Rfl: 0 .  Multiple Vitamins-Minerals (MULTIVITAMIN,TX-MINERALS) tablet, Take 1 tablet by mouth daily., Disp: , Rfl:  .  nitroGLYCERIN (NITROSTAT) 0.4 MG SL tablet, Place 1 tablet (0.4 mg total)  under the tongue every 5 (five) minutes as needed for chest pain (CP or SOB)., Disp: 25 tablet, Rfl: 3 .  Omega-3 Fatty Acids (FISH OIL) 1000 MG  CAPS, Take 1,000 mg by mouth every other day. , Disp: , Rfl:  .  omeprazole (PRILOSEC) 20 MG capsule, Take 1 capsule (20 mg total) by mouth daily., Disp: 30 capsule, Rfl: 3 .  atorvastatin (LIPITOR) 80 MG tablet, Take 1 tablet (80 mg total) by mouth daily at 6 PM., Disp: 90 tablet, Rfl: 3 .  budesonide (PULMICORT) 180 MCG/ACT inhaler, Inhale 2 puffs into the lungs in the morning and at bedtime. (Patient not taking: Reported on 12/08/2020), Disp: 1 each, Rfl: 0 .  clopidogrel (PLAVIX) 75 MG tablet, Take 1 tablet (75 mg total) by mouth daily. (Patient not taking: Reported on 12/08/2020), Disp: 90 tablet, Rfl: 3 .  ondansetron (ZOFRAN) 4 MG tablet, Take 1 tablet (4 mg total) by mouth every 8 (eight) hours as needed for nausea or vomiting. (Patient not taking: Reported on 12/08/2020), Disp: 20 tablet, Rfl: 0 .  predniSONE (DELTASONE) 10 MG tablet, 6 tablets day 1, 5 tablets day 2, 4 tablets day 3, 3 tablets day 4, 2 tablets day 5, 1 tablet day 6 (Patient not taking: Reported on 12/08/2020), Disp: 21 tablet, Rfl: 0   Review of Systems:   ROS Negative unless otherwise specified per HPI.  Vitals:   Vitals:   12/08/20 1138  BP: 120/60  Pulse: 92  Temp: (!) 97.3 F (36.3 C)  TempSrc: Temporal  SpO2: 96%  Weight: (!) 317 lb 9.6 oz (144.1 kg)     Body mass index is 46.9 kg/m.  Physical Exam:   Physical Exam Vitals and nursing note reviewed.  Constitutional:      General: He is not in acute distress.    Appearance: He is well-developed. He is not ill-appearing or toxic-appearing.  Cardiovascular:     Rate and Rhythm: Normal rate and regular rhythm.     Pulses: Normal pulses.     Heart sounds: Normal heart sounds, S1 normal and S2 normal.     Comments: No LE edema Pulmonary:     Effort: Pulmonary effort is normal.     Breath sounds: Normal breath  sounds.  Musculoskeletal:     Comments: No impaired gait.  Tenderness to palpation of left medial calf.  No calf swelling or erythema noted.  Normal range of motion of left leg and knee.  Skin:    General: Skin is warm and dry.  Neurological:     Mental Status: He is alert.     GCS: GCS eye subscore is 4. GCS verbal subscore is 5. GCS motor subscore is 6.  Psychiatric:        Speech: Speech normal.        Behavior: Behavior normal. Behavior is cooperative.      Assessment and Plan:   Kayd was seen today for hip pain.  Diagnoses and all orders for this visit:  Left leg pain Suspect possible sciatica symptoms but due to worsening calf pain and recent travel, will order ultrasound to rule out DVT.  Continue Tylenol as needed. I did give him a short prescription of Flexeril 10 mg daily for him to use for nighttime if needed for sleeping to help with muscle pain. If symptoms persist, will refer to sports medicine. -     VAS [place] LOWER EXTREMITY VENOUS (DVT); Future  Other orders -     cyclobenzaprine (FLEXERIL) 10 MG tablet; Take 1 tablet (10 mg total) by mouth at bedtime as needed for muscle spasms.   Inda Coke, PA-C

## 2020-12-08 NOTE — Patient Instructions (Addendum)
It was great to see you!  I'm glad you are getting better but we should get an ultrasound of your leg just to make sure there is no clot.  You will be contacted for this.  I'm also sending a muscle relaxer for you to use at night to see if this can help your symptoms. Tylenol is ok, continue to take this.  If any new chest pain or shortness of breath, please let us know.  Take care,  Inda Coke PA-C

## 2020-12-08 NOTE — Telephone Encounter (Signed)
Pt called to check where the status of the scheduling for his ultrasound is. Was unable to find Lattie Haw to get info. Benjie Karvonen mentioned she would work on it. Told pt we would call back as soon as we had an answer

## 2020-12-11 ENCOUNTER — Other Ambulatory Visit: Payer: Self-pay

## 2020-12-11 ENCOUNTER — Ambulatory Visit (HOSPITAL_COMMUNITY)
Admission: RE | Admit: 2020-12-11 | Discharge: 2020-12-11 | Disposition: A | Discharge status: Home / Self Care | Payer: Self-pay | Source: Ambulatory Visit | Attending: Physician Assistant | Admitting: Physician Assistant

## 2020-12-11 DIAGNOSIS — M79605 Pain in left leg: Secondary | ICD-10-CM

## 2020-12-11 NOTE — Progress Notes (Signed)
Lower extremity venous has been completed.   Preliminary results in CV Proc.   Abram Sander 12/11/2020 9:22 AM

## 2020-12-17 ENCOUNTER — Other Ambulatory Visit: Payer: Self-pay | Admitting: Family Medicine

## 2020-12-19 ENCOUNTER — Ambulatory Visit (INDEPENDENT_AMBULATORY_CARE_PROVIDER_SITE_OTHER): Payer: Self-pay | Admitting: Family Medicine

## 2020-12-19 ENCOUNTER — Other Ambulatory Visit: Payer: Self-pay

## 2020-12-19 ENCOUNTER — Encounter: Payer: Self-pay | Admitting: Family Medicine

## 2020-12-19 VITALS — BP 125/67 | HR 91 | Temp 98.2°F | Ht 69.0 in | Wt 318.4 lb

## 2020-12-19 DIAGNOSIS — K219 Gastro-esophageal reflux disease without esophagitis: Secondary | ICD-10-CM

## 2020-12-19 DIAGNOSIS — E119 Type 2 diabetes mellitus without complications: Secondary | ICD-10-CM

## 2020-12-19 DIAGNOSIS — I2510 Atherosclerotic heart disease of native coronary artery without angina pectoris: Secondary | ICD-10-CM

## 2020-12-19 MED ORDER — DICLOFENAC SODIUM 75 MG PO TBEC: 75.0000 mg | DELAYED_RELEASE_TABLET | Freq: Two times a day (BID) | ORAL | 0 refills | Status: DC

## 2020-12-19 NOTE — Patient Instructions (Addendum)
It was very nice to see you today!  Please take the medication and work on the exercises.  Let me know if not improving by later this week or next week and we can refer you to see sports medicine or physical therapy.  Take care, Dr Jerline Pain  PLEASE NOTE:  If you had any lab tests please let us know if you have not heard back within a few days. You may see your results on mychart before we have a chance to review them but we will give you a call once they are reviewed by [place]. If we ordered any referrals today, please let us know if you have not heard from their office within the next week.   Please try these tips to maintain a healthy lifestyle:   Eat at least 3 REAL meals and 1-2 snacks per day.  Aim for no more than 5 hours between eating.  If you eat breakfast, please do so within one hour of getting up.    Each meal should contain half fruits/vegetables, one quarter protein, and one quarter carbs (no bigger than a computer mouse)   Cut down on sweet beverages. This includes juice, soda, and sweet tea.     Drink at least 1 glass of water with each meal and aim for at least 8 glasses per day   Exercise at least 150 minutes every week.

## 2020-12-19 NOTE — Progress Notes (Signed)
   Gabriel [name] is a 55 y.o. male who presents today for an office visit.  Assessment/Plan:  New/Acute Problems: Left leg [name] Possible piriformis syndrome given radiation from buttocks into the bottom of his feet.  Discussed home exercises and handout was given.  Also start diclofenac.  Advised patient he should only be on this for the next week or 2 and this should not be a long-term medication.  We would avoid prednisone at this point given his uncontrolled diabetes.  He does have coronary artery disease and is on Plavix however has GI protection with omeprazole.  If symptoms are not improving in the next 1 to 2 weeks would consider referral to sports medicine.  Chronic Problems Addressed Today: Type 2 diabetes-last A1c uncontrolled at 9.0.  We will avoid steroids for the time being  CAD-on Plavix and statin.  Discussed with patient that his NSAID course would be for very short duration only.  GERD- On omeprazole 20 mg daily.   Subjective:  HPI:  Patient here for leg [name] follow-up.  Was seen 11 days ago last office by a different provider for left leg [name].  There is concern for DVT.  Ultrasound was obtained which was negative.  He was given prescription for Flexeril.  [name] has not improved.  Intermittent in nature.  Located in the left lower extremity.  Radiates from buttocks into the bottom of his feet.  He has tried taking muscle laxer with modest improvement.  Tylenol also helps modestly.  Symptoms are worse when sitting.  Better with walking and moving around.  No numbness.  No weakness.       Objective:  Physical Exam: There were no vitals taken for this visit.  Gen: No acute distress, resting comfortably CV: Regular rate and rhythm with no murmurs appreciated Pulm: Normal work of breathing, clear to auscultation bilaterally with no crackles, wheezes, or rhonchi MSK: No deformities.  Left lower extremity nontender to palpation.  Full range of motion throughout.  Straight  leg raise negative.  Positive Faber on the left. Neuro: Grossly normal, moves all extremities Psych: Normal affect and thought content      Maygan Koeller M. Gabriel Pain, MD 12/19/2020 1:08 PM

## 2020-12-21 ENCOUNTER — Other Ambulatory Visit: Payer: Self-pay | Admitting: Family Medicine

## 2021-02-06 ENCOUNTER — Other Ambulatory Visit: Payer: Self-pay | Admitting: Adult Health

## 2021-02-20 ENCOUNTER — Telehealth: Payer: Self-pay | Admitting: Physician Assistant

## 2021-02-20 DIAGNOSIS — B9689 Other specified bacterial agents as the cause of diseases classified elsewhere: Secondary | ICD-10-CM

## 2021-02-20 DIAGNOSIS — J208 Acute bronchitis due to other specified organisms: Secondary | ICD-10-CM

## 2021-02-20 MED ORDER — BENZONATATE 100 MG PO CAPS: 100.0000 mg | ORAL_CAPSULE | Freq: Three times a day (TID) | ORAL | 0 refills | Status: DC | PRN

## 2021-02-20 MED ORDER — DOXYCYCLINE HYCLATE 100 MG PO TABS: 100.0000 mg | ORAL_TABLET | Freq: Two times a day (BID) | ORAL | 0 refills | Status: DC

## 2021-02-20 MED ORDER — ALBUTEROL SULFATE HFA 108 (90 BASE) MCG/ACT IN AERS: 2.0000 | INHALATION_SPRAY | Freq: Four times a day (QID) | RESPIRATORY_TRACT | 0 refills | Status: DC | PRN

## 2021-02-20 NOTE — Progress Notes (Signed)
We are sorry that you are not feeling well.  Here is how we plan to help!  Based on your presentation I believe you most likely have A cough due to bacteria.  When patients have a fever and a productive cough with a change in color or increased sputum production, we are concerned about bacterial bronchitis.  If left untreated it can progress to pneumonia.  If your symptoms do not improve with your treatment plan it is important that you contact your provider.   I have prescribed Doxycycline 100 mg twice a day for 7 days     In addition you may use A prescription cough medication called Tessalon Perles 100mg . You may take 1-2 capsules every 8 hours as needed for your cough.  Albuterol inhaler 1-2 puffs every 4-6 hours as needed for chest tightness, shortness of breath, and/or wheezing  From your responses in the eVisit questionnaire you describe inflammation in the upper respiratory tract which is causing a significant cough.  This is commonly called Bronchitis and has four common causes:   Allergies Viral Infections Acid Reflux Bacterial Infection Allergies, viruses and acid reflux are treated by controlling symptoms or eliminating the cause. An example might be a cough caused by taking certain blood pressure medications. You stop the cough by changing the medication. Another example might be a cough caused by acid reflux. Controlling the reflux helps control the cough.  USE OF BRONCHODILATOR ("RESCUE") INHALERS: There is a risk from using your bronchodilator too frequently.  The risk is that over-reliance on a medication which only relaxes the muscles surrounding the breathing tubes can reduce the effectiveness of medications prescribed to reduce swelling and congestion of the tubes themselves.  Although you feel brief relief from the bronchodilator inhaler, your asthma may actually be worsening with the tubes becoming more swollen and filled with mucus.  This can delay other crucial treatments,  such as oral steroid medications. If you need to use a bronchodilator inhaler daily, several times per day, you should discuss this with your provider.  There are probably better treatments that could be used to keep your asthma under control.     HOME CARE Only take medications as instructed by your medical team. Complete the entire course of an antibiotic. Drink plenty of fluids and get plenty of rest. Avoid close contacts especially the very young and the elderly Cover your mouth if you cough or cough into your sleeve. Always remember to wash your hands A steam or ultrasonic humidifier can help congestion.   GET HELP RIGHT AWAY IF: You develop worsening fever. You become short of breath You cough up blood. Your symptoms persist after you have completed your treatment plan MAKE SURE YOU  Understand these instructions. Will watch your condition. Will get help right away if you are not doing well or get worse.  Your e-visit answers were reviewed by a board certified advanced clinical practitioner to complete your personal care plan.  Depending on the condition, your plan could have included both over the counter or prescription medications. If there is a problem please reply  once you have received a response from your provider. Your safety is important to [place].  If you have drug allergies check your prescription carefully.    You can use MyChart to ask questions about today's visit, request a non-urgent call back, or ask for a work or school excuse for 24 hours related to this e-Visit. If it has been greater than 24 hours you will  need to follow up with your provider, or enter a new e-Visit to address those concerns. You will get an e-mail in the next two days asking about your experience.  I hope that your e-visit has been valuable and will speed your recovery. Thank you for using e-visits.  I provided 6 minutes of non face-to-face time during this encounter for chart review and  documentation.

## 2021-02-22 ENCOUNTER — Other Ambulatory Visit: Payer: Self-pay | Admitting: Cardiology

## 2021-02-22 ENCOUNTER — Other Ambulatory Visit: Payer: Self-pay | Admitting: Adult Health

## 2021-03-06 ENCOUNTER — Other Ambulatory Visit: Payer: Self-pay | Admitting: Cardiology

## 2021-03-06 ENCOUNTER — Other Ambulatory Visit: Payer: Self-pay | Admitting: Family Medicine

## 2021-03-21 ENCOUNTER — Telehealth: Payer: Self-pay | Admitting: Cardiology

## 2021-03-21 NOTE — Telephone Encounter (Signed)
Pt c/o Shortness Of Breath: STAT if SOB developed within the last 24 hours or pt is noticeably SOB on the phone  1. Are you currently SOB (can you hear that pt is SOB on the phone)? no  2. How long have you been experiencing SOB? 6 weeks ago  3. Are you SOB when sitting or when up moving around? Moving around  4. Are you currently experiencing any other symptoms? No   Patient states he had covid back in January and states he still has some SOB from it. He states it started back again maybe 6 weeks ago. He states he has taken nitroglycerin for the SOB. Patient was not SOB on the phone. He states he has also been coughing and is not sure if he coughed up all that was in his lungs while he had covid. He states he was prescribed doxycycline for 14 days for his cough.

## 2021-03-21 NOTE — Telephone Encounter (Signed)
Returned call to patient of Dr. Percival Spanish who reports SOB for about 6 weeks.  He had COVID end of Jan -- took about 6 weeks for his breathing & energy to return to baseline  6/14 - e-visit for acute bacterial bronchitis -- given doxycycline and inhaler   He reports residual SOB and has taken NTG in the last few weeks  He also reports chest pain at rest   He is going on vacation 8/6 for 10 days -- would like to be seen before this   Advised to use NTG as needed. Seek ED eval for acute symptoms - chest pain lasting more than 30 minutes in duration or that increases in intensity/frequency.   Will look for sooner appt and send message to MD to review

## 2021-03-22 NOTE — Telephone Encounter (Signed)
Spoke with patient and offered 03/27/21 visit with Dr. Gasper Sells @ 1:30pm. Patient agreeable. Explained MD would like him seen soon and OK with him seeing one of his partners. Advised him of office address.

## 2021-03-22 NOTE — Telephone Encounter (Signed)
Minus Breeding, MD  Fidel Levy, RN Caller: Unspecified (Yesterday,  4:51 PM) We might need to use one of the DOD held slots.

## 2021-03-26 ENCOUNTER — Other Ambulatory Visit: Payer: Self-pay | Admitting: Cardiology

## 2021-03-27 ENCOUNTER — Telehealth (INDEPENDENT_AMBULATORY_CARE_PROVIDER_SITE_OTHER): Payer: Self-pay | Admitting: Internal Medicine

## 2021-03-27 ENCOUNTER — Encounter: Payer: Self-pay | Admitting: Internal Medicine

## 2021-03-27 ENCOUNTER — Telehealth: Payer: Self-pay

## 2021-03-27 ENCOUNTER — Other Ambulatory Visit: Payer: Self-pay

## 2021-03-27 VITALS — BP 127/86 | HR 84 | Ht 69.0 in | Wt 315.0 lb

## 2021-03-27 DIAGNOSIS — E119 Type 2 diabetes mellitus without complications: Secondary | ICD-10-CM

## 2021-03-27 DIAGNOSIS — M791 Myalgia, unspecified site: Secondary | ICD-10-CM

## 2021-03-27 DIAGNOSIS — E785 Hyperlipidemia, unspecified: Secondary | ICD-10-CM

## 2021-03-27 DIAGNOSIS — R072 Precordial pain: Secondary | ICD-10-CM

## 2021-03-27 DIAGNOSIS — E1169 Type 2 diabetes mellitus with other specified complication: Secondary | ICD-10-CM

## 2021-03-27 DIAGNOSIS — I25119 Atherosclerotic heart disease of native coronary artery with unspecified angina pectoris: Secondary | ICD-10-CM

## 2021-03-27 DIAGNOSIS — R079 Chest pain, unspecified: Secondary | ICD-10-CM

## 2021-03-27 MED ORDER — LOSARTAN POTASSIUM 100 MG PO TABS: 100.0000 mg | ORAL_TABLET | Freq: Every day | ORAL | 3 refills | Status: DC

## 2021-03-27 NOTE — Patient Instructions (Addendum)
Medication Instructions:  Your physician has recommended you make the following change in your medication:  STOP: clopidogrel (Plavix)  DO NOT TAKE: atorvastatin (Lipitor) for 2 weeks due to muscle pain after 2 weeks start back taking medication.  If muscle pain starts back call in to have medication changed.   Losartan was refilled for you   *If you need a refill on your cardiac medications before your next appointment, please call your pharmacy*   Lab Work: NONE If you have labs (blood work) drawn today and your tests are completely normal, you will receive your results only by: [place] (if you have MyChart) OR A paper copy in the mail If you have any lab test that is abnormal or we need to change your treatment, we will call you to review the results.   Testing/Procedures: Your physician has requested that you have en exercise stress myoview. For further information please visit [email or web address]. Please follow instruction sheet, as given.    Follow-Up: At [facility], you and your health needs are our priority.  As part of our continuing mission to provide you with exceptional heart care, we have created designated Provider Care Teams.  These Care Teams include your primary Cardiologist (physician) and Advanced Practice Providers (APPs -  Physician Assistants and Nurse Practitioners) who all work together to provide you with the care you need, when you need it.   Your next appointment:   06/01/21   The format for your next appointment:   In [place], DNP, ANP   Other Instructions  You are scheduled for a Myocardial Perfusion Imaging Study. Please arrive 15 minutes prior to your appointment time for registration and insurance purposes.   The test will take approximately 3 to 4 hours to complete; you may bring reading material.  If someone comes with you to your appointment, they will need to remain in the main lobby due to limited space in the  testing area.    How to prepare for your Myocardial Perfusion Test: Do not eat or drink 3 hours prior to your test, except you may have water. Do not consume products containing caffeine (regular or decaffeinated) 12 hours prior to your test. (ex: coffee, chocolate, sodas, tea). Do bring a list of your current medications with you.  If not listed below, you may take your medications as normal. Do wear comfortable clothes (no dresses or overalls) and walking shoes, tennis shoes preferred (No heels or open toe shoes are allowed). Do NOT wear cologne, perfume, aftershave, or lotions (deodorant is allowed). If these instructions are not followed, your test will have to be rescheduled.  If you cannot keep your appointment, please provide 24 hours notification to the [facility], to avoid a possible $50 charge to your account.

## 2021-03-27 NOTE — Telephone Encounter (Signed)
  Patient Consent for Virtual Visit        Gabriel [name] has provided verbal consent on 03/27/2021 for a virtual visit (video or telephone).   CONSENT FOR VIRTUAL VISIT FOR:  Gabriel [name]  By participating in this virtual visit I agree to the following:  I hereby voluntarily request, consent and authorize [facility] and its employed or contracted physicians, physician assistants, nurse practitioners or other licensed health care professionals (the Practitioner), to provide me with telemedicine health care services (the "Services") as deemed necessary by the treating Practitioner. I acknowledge and consent to receive the Services by the Practitioner via telemedicine. I understand that the telemedicine visit will involve communicating with the Practitioner through live audiovisual communication technology and the disclosure of certain medical information by electronic transmission. I acknowledge that I have been given the opportunity to request an in-person assessment or other available alternative prior to the telemedicine visit and am voluntarily participating in the telemedicine visit.  I understand that I have the right to withhold or withdraw my consent to the use of telemedicine in the course of my care at any time, without affecting my right to future care or treatment, and that the Practitioner or I may terminate the telemedicine visit at any time. I understand that I have the right to inspect all information obtained and/or recorded in the course of the telemedicine visit and may receive copies of available information for a reasonable fee.  I understand that some of the potential risks of receiving the Services via telemedicine include:  Delay or interruption in medical evaluation due to technological equipment failure or disruption; Information transmitted may not be sufficient (e.g. poor resolution of images) to allow for appropriate medical decision making by the Practitioner;  and/or  In rare instances, security protocols could fail, causing a breach of personal health information.  Furthermore, I acknowledge that it is my responsibility to provide information about my medical history, conditions and care that is complete and accurate to the best of my ability. I acknowledge that Practitioner's advice, recommendations, and/or decision may be based on factors not within their control, such as incomplete or inaccurate data provided by me or distortions of diagnostic images or specimens that may result from electronic transmissions. I understand that the practice of medicine is not an exact science and that Practitioner makes no warranties or guarantees regarding treatment outcomes. I acknowledge that a copy of this consent can be made available to me via my patient portal ([name]), or I can request a printed copy by calling the office of [facility].    I understand that my insurance will be billed for this visit.   I have read or had this consent read to me. I understand the contents of this consent, which adequately explains the benefits and risks of the Services being provided via telemedicine.  I have been provided ample opportunity to ask questions regarding this consent and the Services and have had my questions answered to my satisfaction. I give my informed consent for the services to be provided through the use of telemedicine in my medical care

## 2021-03-27 NOTE — Progress Notes (Signed)
Cardiology Office Note:    Date:  03/27/2021   ID:  [name], DOB May 02, 1966, MRN [id number]  PCP:  Marin Olp, MD   [facility] Cardiologist:  Minus Breeding, MD     Referring MD: Marin Olp, MD   CC:SOB I connected with  [name] on 03/27/21 by a video enabled telemedicine application and verified that I am speaking with the correct person using two identifiers.   I discussed the limitations of evaluation and management by telemedicine. The patient expressed understanding and agreed to proceed. Patient: Home Provider: Home Modality: Video- (patient could see me but he could not access his camera) Time: 33 minutes   History of Present Illness:    Gabriel [name] is a 55 y.o. male with a hx of obstructive CAD s/p 2021 LAD PCI, HLD, HTN, DM, MO, OSA not on CPAP who presents for video visit 03/27/21  Patient notes that he is doing well.  Had Covid-19 1/22. Took a while do feel back to baseline.  Had subsequent visit 02/20/21 w PCP and received ABX for bronchitis.  His breathing has come and gone since then.  Never felt back to normal but had improvement in his cough. Has improved with garlic but not resolved his congestion.  The SOB that he mentions is worse with activity.  No chest pressure.  Does have some incidental chest pains that he thinks is related to GERD. No PND/Orthopnea thought keeps him self elevated because of snoring.  Does note leg swelling with prolonged standing; weight has been stable.  No palpitations or syncope.  Does note cramping in his sides with bending over.  Prior angina 10/04/2019 (chest pain accompanied with SOB).  Post PCI was at the gym, working out, and on the elliptical.  Last nitro was 10 days ago, but a pain that didn't go away.   Past Medical History:  Diagnosis Date   Arthritis    "joints; from where I've had surgeries" (08/17/2013)   Chest pain    a. 08/2013 Cardia CTA: Ca score 238 (95%), LM nl, LAD  mod stenosis in D2 area, D1 mild ost stenosis, D2 no signif dzs, LCX small, RCA nl.   Coronary artery disease    LHC (08/18/13):  pLAD 50, CFX and RCA normal.  EF 55-65%.  LAD lesion FFR:  0.88 (normal).  Med rx recommended.     Dysphagia    Upper endoscopy 11/2013 - without obvious stricture s/p Maloney dilation.    GERD (gastroesophageal reflux disease)    Hyperlipidemia    a. Dx 3y ago - not on statin.   Hypertension    a. Dx 3y ago.   Hypothyroidism    a. on replacement.   IBS (irritable bowel syndrome)    colonoscopy 2000   Obesity    Sleep apnea    does not use CPAP   Snoring    Type II diabetes mellitus ([place])     Past Surgical History:  Procedure Laterality Date   ANKLE SURGERY Right    "had a growth in it; cut it out" (08/17/2013)   CARDIAC CATHETERIZATION N/A 09/28/2015   Procedure: Left Heart Cath and Coronary Angiography;  Surgeon: Peter M Martinique, MD;  Location: [facility];  Service: Cardiovascular;  Laterality: N/A;   CHOLECYSTECTOMY     CORONARY STENT INTERVENTION N/A 10/04/2019   Procedure: CORONARY STENT INTERVENTION;  Surgeon: Jettie Booze, MD;  Location: [facility];  Service: Cardiovascular;  Laterality: N/A;   INTRAVASCULAR PRESSURE WIRE/FFR STUDY N/A 10/04/2019   Procedure: INTRAVASCULAR PRESSURE WIRE/FFR STUDY;  Surgeon: Jettie Booze, MD;  Location: [facility];  Service: Cardiovascular;  Laterality: N/A;   KNEE ARTHROSCOPY Right X 2   LEFT HEART CATH AND CORONARY ANGIOGRAPHY N/A 10/04/2019   Procedure: LEFT HEART CATH AND CORONARY ANGIOGRAPHY;  Surgeon: Jettie Booze, MD;  Location: [facility];  Service: Cardiovascular;  Laterality: N/A;   LEFT HEART CATHETERIZATION WITH CORONARY ANGIOGRAM N/A 08/18/2013   Procedure: LEFT HEART CATHETERIZATION WITH CORONARY ANGIOGRAM;  Surgeon: Peter M Martinique, MD;  Location: [facility];  Service: Cardiovascular;  Laterality: N/A;   SHOULDER ARTHROSCOPY W/ ROTATOR CUFF REPAIR  Left     Current Medications: Current Meds  Medication Sig   acetaminophen (TYLENOL) 500 MG tablet Take 1,000 mg by mouth every 6 (six) hours as needed for mild pain or moderate pain. Reported on 01/09/2016   aspirin EC 81 MG EC tablet Take 1 tablet (81 mg total) by mouth daily.   atorvastatin (LIPITOR) 80 MG tablet TAKE 1 TABLET BY MOUTH ONCE DAILY AT  6PM   cyclobenzaprine (FLEXERIL) 10 MG tablet Take 1 tablet (10 mg total) by mouth at bedtime as needed for muscle spasms.   EUTHYROX 125 MCG tablet TAKE 1 TABLET BY MOUTH ONCE DAILY BEFORE BREAKFAST   glimepiride (AMARYL) 4 MG tablet TAKE 2 TABLETS BY MOUTH ONCE DAILY BEFORE BREAKFAST   glucose blood (ONE TOUCH TEST STRIPS) test strip Up to 4 times daily.  Onetouch Verio.  E11.65   insulin NPH Human (NOVOLIN N) 100 UNIT/ML injection Inject 0.04-0.15 mLs (4-15 Units total) into the skin 2 (two) times daily before a meal.   Lancets (ONETOUCH ULTRASOFT) lancets Up to 4 times daily.  Onetouch Verio.  DxE11.65   metFORMIN (GLUCOPHAGE) 1000 MG tablet TAKE 1 TABLET BY MOUTH TWICE DAILY WITH A MEAL   Multiple Vitamins-Minerals (MULTIVITAMIN,TX-MINERALS) tablet Take 1 tablet by mouth daily.   nitroGLYCERIN (NITROSTAT) 0.4 MG SL tablet Place 1 tablet (0.4 mg total) under the tongue every 5 (five) minutes as needed for chest pain (CP or SOB).   Omega-3 Fatty Acids (OMEGA-3 FISH OIL) 500 MG CAPS Take 500 mg by mouth every other day.   omeprazole (PRILOSEC) 20 MG capsule Take 1 capsule (20 mg total) by mouth daily.   [DISCONTINUED] clopidogrel (PLAVIX) 75 MG tablet Take 1 tablet by mouth once daily   [DISCONTINUED] losartan (COZAAR) 100 MG tablet Take 1 tablet by mouth once daily     Allergies:   Patient has no known allergies.   Social History   Socioeconomic History   Marital status: Single    Spouse name: Not on file   Number of children: 0   Years of education: Not on file   Highest education level: Not on file  Occupational History    Occupation: Unemployed  Tobacco Use   Smoking status: Former    Packs/day: 1.50    Years: 24.00    Pack years: 36.00    Types: Cigarettes    Quit date: 09/10/2007    Years since quitting: 13.5   Smokeless tobacco: Never  Vaping Use   Vaping Use: Never used  Substance and Sexual Activity   Alcohol use: No   Drug use: No   Sexual activity: Yes  Other Topics Concern   Not on file  Social History Narrative   Lives in [place] with parents (cares for parents).  Never married.  Unemployed.     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 There is no history of Colon cancer, Esophageal cancer, Inflammatory bowel disease, Liver disease, Pancreatic cancer, Rectal cancer, or Stomach cancer. He was adopted.  ROS:   Please see the history of present illness.     All other systems reviewed and are negative.  EKGs/Labs/Other Studies Reviewed:    The following studies were reviewed today:  Transthoracic Echocardiogram: Date: 10/03/19 Results: 1. Left ventricular ejection fraction, by visual estimation, is 55 to  60%. The left ventricle has normal function. Unable to assess for left  ventricular hypertrophy.   2. Definity contrast agent was given IV to delineate the left ventricular  endocardial borders.   3. Left ventricular diastolic parameters are indeterminate.   4. The left ventricle has no regional wall motion  abnormalities.   5. Global right ventricle has normal systolic function.The right  ventricular size is mildly enlarged. No increase in right ventricular wall  thickness.   6. Left atrial size was normal.   7. Right atrial size was normal.   8. The mitral valve is grossly normal. No evidence of mitral valve  regurgitation.   9. The tricuspid valve is grossly normal.  10. The tricuspid valve is grossly normal. Tricuspid valve regurgitation  is not demonstrated.  11. The aortic valve is tricuspid. Aortic valve regurgitation is not  visualized. No evidence of aortic valve sclerosis or stenosis.  12. The pulmonic valve was not well visualized. Pulmonic valve  regurgitation is not visualized.  13. The inferior vena cava is dilated in size with >50% respiratory  variability, suggesting right atrial pressure of 8 mmHg.  14. The aortic root was not well visualized.   Cardiac CT: Date: 08/10/2013 Results: IMPRESSION: 1. Prominent mixed plaque in the proximal LAD in the area of the D2 origin. There appears to be up to moderate stenosis.   2. Age-advanced coronary artery calcification with calcium score 238 Agatston units. This places the patient in the 95th percentile for his age and gender. This suggests that he is in a high risk cohort for future cardiac events.  NM Stress Testing : Date: 09/19/2015 Results: The left ventricular ejection fraction is normal (55-65%). Nuclear stress EF: 56%. No T wave inversion was noted during stress. There was no ST segment deviation noted during stress. Defect 1: There is a medium defect of moderate severity. Findings consistent with ischemia. This is an intermediate risk study.   Moderate size and intensity, reversible inferior wall defect. SDS scores not calculated, however, there appears to be significant inferior ischemia. There is mild inferior attenuation, which does not affect interpretation. LVEF 56%, normal wall motion. This is an  intermediate risk study. Clinical correlation is advised.  Left/Right Heart Catheterizations: Date:10/04/19 Results: Mid LAD lesion is 60% stenosed. FFR was 0.78. A drug-eluting stent was successfully placed using a SYNERGY XD 3.0X20. Post intervention, there is a 0% residual  stenosis. The left ventricular systolic function is normal. LV end diastolic pressure is normal. LVEDP 14 mm Hg. The left ventricular ejection fraction is 55-65% by visual estimate. There is no aortic valve stenosis.   Recent Labs: 05/30/2020: ALT 35 10/13/2020: BUN 10; Creatinine, Ser 0.99; Hemoglobin 13.1; Platelets 163; Potassium 4.0; Sodium 138  Recent Lipid Panel    Component Value Date/Time   CHOL 116 02/14/2020 1035   CHOL 118 11/16/2019 1453   TRIG 120.0 02/14/2020 1035   HDL 31.30 (L) 02/14/2020 1035   HDL 34 (L) 11/16/2019 1453   CHOLHDL 4 02/14/2020 1035   VLDL 24.0 02/14/2020 1035   LDLCALC 61 02/14/2020 1035   LDLCALC 64 11/16/2019 1453   LDLDIRECT 68.0 03/11/2019 1427    Physical Exam:    VS:  BP 127/86   Pulse 84   Ht 5\' 9"  (1.753 m)   Wt (!) 142.9 kg   BMI 46.52 kg/m     Wt Readings from Last 3 Encounters:  03/27/21 (!) 142.9 kg  12/19/20 (!) 144.4 kg  12/08/20 (!) 144.1 kg     GEN:  Well nourished, well developed in no acute distress RESPIRATORY:  No tachypnea; can speak full sentences without issue  PSYCHIATRIC:  Normal affect   ASSESSMENT:    1. Chest pain of uncertain etiology   2. Morbid obesity ([place])   3. Diabetes mellitus without complication ([place])   4. Hyperlipidemia associated with type 2 diabetes mellitus ([place])   5. Myalgia   6. Atherosclerosis of native coronary artery of native heart with angina pectoris (H[facility])    PLAN:    SOB Obstructive CAD s/p 2021 LAD PCI HLD HTN & DM, Morbid Obesity - given mixed picture symptoms of SOB, DOE, and CP will get NM Stress Test for LV function - continue ASA, stop plavix after one year post PCI - Omega 3 reasonable to  continue - given mylagias, will trial of atorvastatin; Pt to call back in two weeks to see how they change, may need rosuvastatin instead - will refill losartan 100 - continue PNR nitro  OSA not on CPAP - Home Sleep Study offered and as well as referral  Shared Decision Making/Informed Consent The risks [chest pain, shortness of breath, cardiac arrhythmias, dizziness, blood pressure fluctuations, myocardial infarction, stroke/transient ischemic attack, nausea, vomiting, allergic reaction, radiation exposure, metallic taste sensation and life-threatening complications (estimated to be 1 in 10,000)], benefits (risk stratification, diagnosing coronary artery disease, treatment guidance) and alternatives of a nuclear stress test were discussed in detail with Mr. Hermiz and he agrees to proceed.    Medication Adjustments/Labs and Tests Ordered: Current medicines are reviewed at length with the patient today.  Concerns regarding medicines are outlined above.  Orders Placed This Encounter  Procedures   Cardiac Stress Test: Informed Consent Details: Physician/Practitioner Attestation; Transcribe to consent form and obtain patient signature    No orders of the defined types were placed in this encounter.   There are no Patient Instructions on file for this visit.   Signed, Werner Lean, MD  03/27/2021 1:58 PM    [place]

## 2021-04-09 ENCOUNTER — Telehealth (HOSPITAL_COMMUNITY): Payer: Self-pay

## 2021-04-09 NOTE — Telephone Encounter (Signed)
Spoke with the patient, detailed instructions given. He stated that he would be here for his test. Asked to call back with any questions. S.Chantae Soo EMTP 

## 2021-04-10 ENCOUNTER — Ambulatory Visit (HOSPITAL_COMMUNITY): Payer: Self-pay | Attending: Cardiovascular Disease

## 2021-04-10 ENCOUNTER — Other Ambulatory Visit: Payer: Self-pay

## 2021-04-10 DIAGNOSIS — R072 Precordial pain: Secondary | ICD-10-CM | POA: Insufficient documentation

## 2021-04-10 MED ORDER — REGADENOSON 0.4 MG/5ML IV SOLN
0.4000 mg | Freq: Once | INTRAVENOUS | Status: AC
  Administered 2021-04-10: 0.4 mg via INTRAVENOUS

## 2021-04-10 MED ORDER — TECHNETIUM TC 99M TETROFOSMIN IV KIT
32.4000 | PACK | Freq: Once | INTRAVENOUS | Status: AC | PRN
Start: 2021-04-10 — End: 2021-04-10
  Administered 2021-04-10: 32.4 via INTRAVENOUS
  Filled 2021-04-10: qty 33

## 2021-04-11 ENCOUNTER — Ambulatory Visit (HOSPITAL_COMMUNITY): Payer: Self-pay | Attending: Cardiology

## 2021-04-11 LAB — MYOCARDIAL PERFUSION IMAGING
LV dias vol: 80 mL (ref 62–150)
LV sys vol: 35 mL
Peak HR: 109 {beats}/min
Rest HR: 85 {beats}/min
SDS: 2
SRS: 0
SSS: 2
TID: 1.02

## 2021-04-11 MED ORDER — TECHNETIUM TC 99M TETROFOSMIN IV KIT
31.2000 | PACK | Freq: Once | INTRAVENOUS | Status: AC | PRN
Start: 2021-04-11 — End: 2021-04-11
  Administered 2021-04-11: 31.2 via INTRAVENOUS
  Filled 2021-04-11: qty 32

## 2021-04-17 ENCOUNTER — Telehealth: Payer: Self-pay | Admitting: Internal Medicine

## 2021-04-17 NOTE — Telephone Encounter (Signed)
Called pt back to follow up on statin medication.  Voicemail full unable to leave a message will attempt to call back at a later time.

## 2021-04-17 NOTE — Telephone Encounter (Signed)
Patient was returning call for results please advise

## 2021-04-17 NOTE — Telephone Encounter (Signed)
Called pt and reviewed results. All questions were answered.

## 2021-04-30 ENCOUNTER — Telehealth: Payer: Self-pay

## 2021-04-30 MED ORDER — ROSUVASTATIN CALCIUM 5 MG PO TABS: 5.0000 mg | ORAL_TABLET | Freq: Every day | ORAL | 11 refills | Status: DC

## 2021-04-30 NOTE — Telephone Encounter (Signed)
-----   Message from Werner Lean, MD sent at 04/11/2021  5:43 PM EDT ----- Results: No evidence of ischemia  Plan: No change in medications  Werner Lean, MD

## 2021-04-30 NOTE — Telephone Encounter (Signed)
Called pt reviewed MD recommendation to start rosuvastatin 5 mg PO QD.  Pt has previously stopped taking atorvastatin d/t side effects.  All questions answered.

## 2021-05-30 NOTE — Progress Notes (Deleted)
Cardiology Office Note   Date:  05/30/2021   ID:  Gabriel [name], DOB 04/25/1966, MRN [id number]  PCP:  Marin Olp, MD  Cardiologist: Dr. Percival Spanish No chief complaint on file.    History of Present Illness: Gabriel [name] is a 55 y.o. male who presents for ongoing assessment and management of coronary artery disease, status post PCI to the LAD in 2021, hypertension, hyperlipidemia, with other history to include diabetes, OSA not on CPAP, and morbid obesity.  The patient did test positive for COVID in January 2022.  On last office visit dated 03/27/2021, via telemedicine visit,he was seen by Dr. Gasper Sells with complaints of chest pain.  Because of the symptoms and history of diabetes, hypertension, and morbid obesity, a nuclear medicine stress test was ordered.  This was completed on 04/11/2021, and did not reveal any evidence of ischemia.  Patient was to continue his current medication regimen.  Were also to evaluate patient's symptoms on rosuvastatin 5 mg daily as he had had myalgia problems in the past with other statins.   Past Medical History:  Diagnosis Date   Arthritis    "joints; from where I've had surgeries" (08/17/2013)   Chest pain    a. 08/2013 Cardia CTA: Ca score 238 (95%), LM nl, LAD mod stenosis in D2 area, D1 mild ost stenosis, D2 no signif dzs, LCX small, RCA nl.   Coronary artery disease    LHC (08/18/13):  pLAD 50, CFX and RCA normal.  EF 55-65%.  LAD lesion FFR:  0.88 (normal).  Med rx recommended.     Dysphagia    Upper endoscopy 11/2013 - without obvious stricture s/p Maloney dilation.    GERD (gastroesophageal reflux disease)    Hyperlipidemia    a. Dx 3y ago - not on statin.   Hypertension    a. Dx 3y ago.   Hypothyroidism    a. on replacement.   IBS (irritable bowel syndrome)    colonoscopy 2000   Obesity    Sleep apnea    does not use CPAP   Snoring    Type II diabetes mellitus ([place])     Past Surgical History:  Procedure Laterality Date    ANKLE SURGERY Right    "had a growth in it; cut it out" (08/17/2013)   CARDIAC CATHETERIZATION N/A 09/28/2015   Procedure: Left Heart Cath and Coronary Angiography;  Surgeon: Peter M Martinique, MD;  Location: [facility];  Service: Cardiovascular;  Laterality: N/A;   CHOLECYSTECTOMY     CORONARY STENT INTERVENTION N/A 10/04/2019   Procedure: CORONARY STENT INTERVENTION;  Surgeon: Jettie Booze, MD;  Location: [facility];  Service: Cardiovascular;  Laterality: N/A;   INTRAVASCULAR PRESSURE WIRE/FFR STUDY N/A 10/04/2019   Procedure: INTRAVASCULAR PRESSURE WIRE/FFR STUDY;  Surgeon: Jettie Booze, MD;  Location: [facility];  Service: Cardiovascular;  Laterality: N/A;   KNEE ARTHROSCOPY Right X 2   LEFT HEART CATH AND CORONARY ANGIOGRAPHY N/A 10/04/2019   Procedure: LEFT HEART CATH AND CORONARY ANGIOGRAPHY;  Surgeon: Jettie Booze, MD;  Location: [facility];  Service: Cardiovascular;  Laterality: N/A;   LEFT HEART CATHETERIZATION WITH CORONARY ANGIOGRAM N/A 08/18/2013   Procedure: LEFT HEART CATHETERIZATION WITH CORONARY ANGIOGRAM;  Surgeon: Peter M Martinique, MD;  Location: [facility];  Service: Cardiovascular;  Laterality: N/A;   SHOULDER ARTHROSCOPY W/ ROTATOR CUFF REPAIR Left      Current Outpatient Medications  Medication Sig Dispense Refill  acetaminophen (TYLENOL) 500 MG tablet Take 1,000 mg by mouth every 6 (six) hours as needed for mild pain or moderate pain. Reported on 01/09/2016     albuterol (VENTOLIN HFA) 108 (90 Base) MCG/ACT inhaler Inhale 2 puffs into the lungs every 6 (six) hours as needed for wheezing or shortness of breath. (Patient not taking: Reported on 03/27/2021) 8 g 0   aspirin EC 81 MG EC tablet Take 1 tablet (81 mg total) by mouth daily.     benzonatate (TESSALON) 100 MG capsule Take 1 capsule (100 mg total) by mouth 3 (three) times daily as needed. (Patient not taking: Reported on 03/27/2021) 30 capsule 0   cyclobenzaprine  (FLEXERIL) 10 MG tablet Take 1 tablet (10 mg total) by mouth at bedtime as needed for muscle spasms. 14 tablet 0   diclofenac (VOLTAREN) 75 MG EC tablet Take 1 tablet (75 mg total) by mouth 2 (two) times daily. (Patient not taking: Reported on 03/27/2021) 30 tablet 0   doxycycline (VIBRA-TABS) 100 MG tablet Take 1 tablet (100 mg total) by mouth 2 (two) times daily. (Patient not taking: Reported on 03/27/2021) 14 tablet 0   EUTHYROX 125 MCG tablet TAKE 1 TABLET BY MOUTH ONCE DAILY BEFORE BREAKFAST 90 tablet 0   glimepiride (AMARYL) 4 MG tablet TAKE 2 TABLETS BY MOUTH ONCE DAILY BEFORE BREAKFAST 180 tablet 0   glucose blood (ONE TOUCH TEST STRIPS) test strip Up to 4 times daily.  Onetouch Verio.  E11.65 100 each 12   insulin NPH Human (NOVOLIN N) 100 UNIT/ML injection Inject 0.04-0.15 mLs (4-15 Units total) into the skin 2 (two) times daily before a meal. 10 mL 11   Lancets (ONETOUCH ULTRASOFT) lancets Up to 4 times daily.  Onetouch Verio.  DxE11.65 100 each 12   losartan (COZAAR) 100 MG tablet Take 1 tablet (100 mg total) by mouth daily. 90 tablet 3   metFORMIN (GLUCOPHAGE) 1000 MG tablet TAKE 1 TABLET BY MOUTH TWICE DAILY WITH A MEAL 180 tablet 0   Multiple Vitamins-Minerals (MULTIVITAMIN,TX-MINERALS) tablet Take 1 tablet by mouth daily.     nitroGLYCERIN (NITROSTAT) 0.4 MG SL tablet Place 1 tablet (0.4 mg total) under the tongue every 5 (five) minutes as needed for chest pain (CP or SOB). 25 tablet 3   Omega-3 Fatty Acids (FISH OIL) 1000 MG CAPS Take 1,000 mg by mouth every other day.  (Patient not taking: Reported on 03/27/2021)     Omega-3 Fatty Acids (OMEGA-3 FISH OIL) 500 MG CAPS Take 500 mg by mouth every other day.     omeprazole (PRILOSEC) 20 MG capsule Take 1 capsule (20 mg total) by mouth daily. 30 capsule 3   rosuvastatin (CRESTOR) 5 MG tablet Take 1 tablet (5 mg total) by mouth daily. 30 tablet 11   No current facility-administered medications for this visit.    Allergies:   Patient has  no known allergies.    Social History:  The patient  reports that he quit smoking about 13 years ago. His smoking use included cigarettes. He has a 36.00 pack-year smoking history. He has never used smokeless tobacco. He reports that he does not drink alcohol and does not use drugs.   Family History:  The patient's family history includes Asthma in his maternal grandfather; Coronary artery disease in his father; Diabetes in his brother, father, and mother; Hypertension in his father and mother; Liver cancer in his mother; Obesity in his brother and mother; Stroke in his father. He was adopted.  ROS: All other systems are reviewed and negative. Unless otherwise mentioned in H&P    PHYSICAL EXAM: VS:  There were no vitals taken for this visit. , BMI There is no height or weight on file to calculate BMI. GEN: Well nourished, well developed, in no acute distress HEENT: normal Neck: no JVD, carotid bruits, or masses Cardiac: ***RRR; no murmurs, rubs, or gallops,no edema  Respiratory:  Clear to auscultation bilaterally, normal work of breathing GI: soft, nontender, nondistended, + BS MS: no deformity or atrophy Skin: warm and dry, no rash Neuro:  Strength and sensation are intact Psych: euthymic mood, full affect   EKG:  EKG {ACTION; IS/IS [id number]} ordered today. The ekg ordered today demonstrates ***   Recent Labs: 10/13/2020: BUN 10; Creatinine, Ser 0.99; Hemoglobin 13.1; Platelets 163; Potassium 4.0; Sodium 138    Lipid Panel    Component Value Date/Time   CHOL 116 02/14/2020 1035   CHOL 118 11/16/2019 1453   TRIG 120.0 02/14/2020 1035   HDL 31.30 (L) 02/14/2020 1035   HDL 34 (L) 11/16/2019 1453   CHOLHDL 4 02/14/2020 1035   VLDL 24.0 02/14/2020 1035   LDLCALC 61 02/14/2020 1035   LDLCALC 64 11/16/2019 1453   LDLDIRECT 68.0 03/11/2019 1427      Wt Readings from Last 3 Encounters:  04/10/21 (!) 315 lb (142.9 kg)  03/27/21 (!) 315 lb (142.9 kg)  12/19/20 (!) 318 lb  6.4 oz (144.4 kg)      Other studies Reviewed:  Study Highlights 04/11/2021     Nuclear stress EF: 56%. The left ventricular ejection fraction is normal (55-65%). There was no ST segment deviation noted during stress. Defect 1: There is a medium defect of mild severity present in the basal inferior, mid inferior and apical inferior location. The study is normal. This is a low risk study.   1. Fixed inferior perfusion defect with normal wall motion, consistent with artifact 2. Low risk study Date: 10/03/19 Results: 1. Left ventricular ejection fraction, by visual estimation, is 55 to  60%. The left ventricle has normal function. Unable to assess for left  ventricular hypertrophy.   2. Definity contrast agent was given IV to delineate the left ventricular  endocardial borders.   3. Left ventricular diastolic parameters are indeterminate.   4. The left ventricle has no regional wall motion abnormalities.   5. Global right ventricle has normal systolic function.The right  ventricular size is mildly enlarged. No increase in right ventricular wall  thickness.   6. Left atrial size was normal.   7. Right atrial size was normal.   8. The mitral valve is grossly normal. No evidence of mitral valve  regurgitation.   9. The tricuspid valve is grossly normal.  10. The tricuspid valve is grossly normal. Tricuspid valve regurgitation  is not demonstrated.  11. The aortic valve is tricuspid. Aortic valve regurgitation is not  visualized. No evidence of aortic valve sclerosis or stenosis.  12. The pulmonic valve was not well visualized. Pulmonic valve  regurgitation is not visualized.  13. The inferior vena cava is dilated in size with >50% respiratory  variability, suggesting right atrial pressure of 8 mmHg.  14. The aortic root was not well visualized.    Cardiac CT: Date: 08/10/2013 Results: IMPRESSION: 1. Prominent mixed plaque in the proximal LAD in the area of the D2 origin. There  appears to be up to moderate stenosis.   2. Age-advanced coronary artery calcification with calcium score 238 Agatston units. This  places the patient in the 95th percentile for his age and gender. This suggests that he is in a high risk cohort for future cardiac events.   NM Stress Testing : Date: 09/19/2015 Results: The left ventricular ejection fraction is normal (55-65%). Nuclear stress EF: 56%. No T wave inversion was noted during stress. There was no ST segment deviation noted during stress. Defect 1: There is a medium defect of moderate severity. Findings consistent with ischemia. This is an intermediate risk study.   Moderate size and intensity, reversible inferior wall defect. SDS scores not calculated, however, there appears to be significant inferior ischemia. There is mild inferior attenuation, which does not affect interpretation. LVEF 56%, normal wall motion. This is an intermediate risk study. Clinical correlation is advised.   Left/Right Heart Catheterizations: Date:10/04/19 Results: Mid LAD lesion is 60% stenosed. FFR was 0.78. A drug-eluting stent was successfully placed using a SYNERGY XD 3.0X20. Post intervention, there is a 0% residual stenosis. The left ventricular systolic function is normal. LV end diastolic pressure is normal. LVEDP 14 mm Hg. The left ventricular ejection fraction is 55-65% by visual estimate. There is no aortic valve stenosis.   ASSESSMENT AND PLAN:  1.  ***   Current medicines are reviewed at length with the patient today.  I have spent *** dedicated to the care of this patient on the date of this encounter to include pre-visit review of records, assessment, management and diagnostic testing,with shared decision making.  Labs/ tests ordered today include: *** Gabriel [name], ANP, AACC   05/30/2021 [facility] [place] [address] Office [phone] Fax [phone]  Notice: This  dictation was prepared with Dragon dictation along with smaller phrase technology. Any transcriptional errors that result from this process are unintentional and may not be corrected upon review.

## 2021-05-31 ENCOUNTER — Telehealth: Payer: Self-pay

## 2021-05-31 ENCOUNTER — Encounter: Payer: Self-pay | Admitting: Adult Health

## 2021-05-31 NOTE — Telephone Encounter (Signed)
Attempted to contact patient to reschedule appointment due to provider out sick. Unable to leave message on cell phone or home phone number

## 2021-06-01 ENCOUNTER — Ambulatory Visit: Payer: Self-pay | Admitting: Adult Health

## 2021-06-13 ENCOUNTER — Telehealth: Payer: Self-pay | Admitting: Family Medicine

## 2021-06-14 NOTE — Progress Notes (Signed)
Cardiology Office Note   Date:  06/15/2021   ID:  JOHNCHARLES [name], DOB [birth year], MRN [id number]  PCP:  Marin Olp, MD  Cardiologist:  Dr. Percival Spanish  CC: Follow Up Stress Test and Dyspnea    History of Present Illness: Gabriel [name] is a  very pleasant 55 y.o. male who presents for ongoing assessment and management of nonobstructive coronary artery disease, status post cardiac catheterization in 2021 with PCI to the LAD, history of hyperlipidemia, hypertension, with other history to include diabetes mellitus, morbid obesity, OSA not on CPAP.  His last encounter was on 03/27/2021 by Dr. Gasper Sells, via telemedicine visit.  At that time it was documented the patient had COVID-19 in January 2022.  He was subsequently treated in June 2022 with bronchitis receiving antibiotics, and his breathing status has been tenuous with waxes and waning in his breathing comfort.  He mentioned that his dyspnea was worse with activity but not related to chest pressure.    A nuclear medicine stress test was ordered to evaluate further given his mixed picture of symptoms of dyspnea on exertion with associated minimal chest discomfort.  Also the patient was changed to atorvastatin for hyperlipidemia with follow-up concerning myalgia pain. Nuclear medicine stress test which was dated on 04/11/2021 revealed a fixed inferior perfusion defect with normal wall motion consistent with artifact was found to be a low risk study.  He continues to have mild myalgia pain, but it is improving. He is very busy handling rental properties, taking care of his father who had a stroke.  He continues to have some DOE, and daytime fatigue with sleepiness at times. States he does not sleep well, but is able to sleep better when he is in a recliner.    Past Medical History:  Diagnosis Date   Arthritis    "joints; from where I've had surgeries" (08/17/2013)   Chest pain    a. 08/2013 [facility]ia CTA: Ca score 238 (95%), LM nl, LAD  mod stenosis in D2 area, D1 mild ost stenosis, D2 no signif dzs, LCX small, RCA nl.   Coronary artery disease    LHC (08/18/13):  pLAD 50, CFX and RCA normal.  EF 55-65%.  LAD lesion FFR:  0.88 (normal).  Med rx recommended.     Dysphagia    Upper endoscopy 11/2013 - without obvious stricture s/p Maloney dilation.    GERD (gastroesophageal reflux disease)    Hyperlipidemia    a. Dx 3y ago - not on statin.   Hypertension    a. Dx 3y ago.   Hypothyroidism    a. on replacement.   IBS (irritable bowel syndrome)    colonoscopy 2000   Obesity    Sleep apnea    does not use CPAP   Snoring    Type II diabetes mellitus ([place])     Past Surgical History:  Procedure Laterality Date   ANKLE SURGERY Right    "had a growth in it; cut it out" (08/17/2013)   CARDIAC CATHETERIZATION N/A 09/28/2015   Procedure: Left Heart Cath and Coronary Angiography;  Surgeon: Peter M Martinique, MD;  Location: [facility];  Service: Cardiovascular;  Laterality: N/A;   CHOLECYSTECTOMY     CORONARY STENT INTERVENTION N/A 10/04/2019   Procedure: CORONARY STENT INTERVENTION;  Surgeon: Jettie Booze, MD;  Location: [facility];  Service: Cardiovascular;  Laterality: N/A;   INTRAVASCULAR PRESSURE WIRE/FFR STUDY N/A 10/04/2019   Procedure: INTRAVASCULAR PRESSURE WIRE/FFR STUDY;  Surgeon: Irish Lack,  Charlann Lange, MD;  Location: [facility];  Service: Cardiovascular;  Laterality: N/A;   KNEE ARTHROSCOPY Right X 2   LEFT HEART CATH AND CORONARY ANGIOGRAPHY N/A 10/04/2019   Procedure: LEFT HEART CATH AND CORONARY ANGIOGRAPHY;  Surgeon: Jettie Booze, MD;  Location: [facility];  Service: Cardiovascular;  Laterality: N/A;   LEFT HEART CATHETERIZATION WITH CORONARY ANGIOGRAM N/A 08/18/2013   Procedure: LEFT HEART CATHETERIZATION WITH CORONARY ANGIOGRAM;  Surgeon: Peter M Martinique, MD;  Location: [facility];  Service: Cardiovascular;  Laterality: N/A;   SHOULDER ARTHROSCOPY W/ ROTATOR CUFF REPAIR  Left      Current Outpatient Medications  Medication Sig Dispense Refill   acetaminophen (TYLENOL) 500 MG tablet Take 1,000 mg by mouth every 6 (six) hours as needed for mild pain or moderate pain. Reported on 01/09/2016     aspirin EC 81 MG EC tablet Take 1 tablet (81 mg total) by mouth daily.     cyclobenzaprine (FLEXERIL) 10 MG tablet Take 1 tablet (10 mg total) by mouth at bedtime as needed for muscle spasms. 14 tablet 0   EUTHYROX 125 MCG tablet TAKE 1 TABLET BY MOUTH ONCE DAILY BEFORE BREAKFAST 90 tablet 0   glimepiride (AMARYL) 4 MG tablet TAKE 2 TABLETS BY MOUTH ONCE DAILY BEFORE BREAKFAST 180 tablet 0   glucose blood (ONE TOUCH TEST STRIPS) test strip Up to 4 times daily.  Onetouch Verio.  E11.65 100 each 12   insulin NPH Human (NOVOLIN N) 100 UNIT/ML injection Inject 0.04-0.15 mLs (4-15 Units total) into the skin 2 (two) times daily before a meal. 10 mL 11   Lancets (ONETOUCH ULTRASOFT) lancets Up to 4 times daily.  Onetouch Verio.  DxE11.65 100 each 12   losartan (COZAAR) 100 MG tablet Take 1 tablet (100 mg total) by mouth daily. 90 tablet 3   metFORMIN (GLUCOPHAGE) 1000 MG tablet TAKE 1 TABLET BY MOUTH TWICE DAILY WITH A MEAL 180 tablet 0   Multiple Vitamins-Minerals (MULTIVITAMIN,TX-MINERALS) tablet Take 1 tablet by mouth daily.     nitroGLYCERIN (NITROSTAT) 0.4 MG SL tablet Place 1 tablet (0.4 mg total) under the tongue every 5 (five) minutes as needed for chest pain (CP or SOB). 25 tablet 3   Omega-3 Fatty Acids (OMEGA-3 FISH OIL) 500 MG CAPS Take 500 mg by mouth every other day.     omeprazole (PRILOSEC) 20 MG capsule Take 1 capsule (20 mg total) by mouth daily. 30 capsule 3   rosuvastatin (CRESTOR) 5 MG tablet Take 1 tablet (5 mg total) by mouth daily. 30 tablet 11   albuterol (VENTOLIN HFA) 108 (90 Base) MCG/ACT inhaler Inhale 2 puffs into the lungs every 6 (six) hours as needed for wheezing or shortness of breath. (Patient not taking: No sig reported) 8 g 0   benzonatate  (TESSALON) 100 MG capsule Take 1 capsule (100 mg total) by mouth 3 (three) times daily as needed. (Patient not taking: No sig reported) 30 capsule 0   diclofenac (VOLTAREN) 75 MG EC tablet Take 1 tablet (75 mg total) by mouth 2 (two) times daily. (Patient not taking: No sig reported) 30 tablet 0   doxycycline (VIBRA-TABS) 100 MG tablet Take 1 tablet (100 mg total) by mouth 2 (two) times daily. (Patient not taking: No sig reported) 14 tablet 0   Omega-3 Fatty Acids (FISH OIL) 1000 MG CAPS Take 1,000 mg by mouth every other day.  (Patient not taking: No sig reported)     No current facility-administered  medications for this visit.    Allergies:   Patient has no known allergies.    Social History:  The patient  reports that he quit smoking about 13 years ago. His smoking use included cigarettes. He has a 36.00 pack-year smoking history. He has never used smokeless tobacco. He reports that he does not drink alcohol and does not use drugs.   Family History:  The patient's family history includes Asthma in his maternal grandfather; Coronary artery disease in his father; Diabetes in his brother, father, and mother; Hypertension in his father and mother; Liver cancer in his mother; Obesity in his brother and mother; Stroke in his father. He was adopted.    ROS: All other systems are reviewed and negative. Unless otherwise mentioned in H&P    PHYSICAL EXAM: VS:  BP 132/74   Pulse 74   Ht 5\' 9"  (1.753 m)   Wt (!) 320 lb 3.2 oz (145.2 kg)   SpO2 93%   BMI 47.29 kg/m  , BMI Body mass index is 47.29 kg/m. GEN: Well nourished, well developed, in no acute distress, central obesity. HEENT: normal Neck: no JVD, carotid bruits, or masses Cardiac: RRR; no murmurs, rubs, or gallops,no edema  Respiratory:  Clear to auscultation bilaterally, normal work of breathing GI: soft, nontender, nondistended, + BS MS: no deformity or atrophy Skin: warm and dry, no rash Neuro:  Strength and sensation are  intact Psych: euthymic mood, full affect   EKG:  (Personally reviewed). NSR, rate of 74.   Recent Labs: 10/13/2020: BUN 10; Creatinine, Ser 0.99; Hemoglobin 13.1; Platelets 163; Potassium 4.0; Sodium 138    Lipid Panel    Component Value Date/Time   CHOL 116 02/14/2020 1035   CHOL 118 11/16/2019 1453   TRIG 120.0 02/14/2020 1035   HDL 31.30 (L) 02/14/2020 1035   HDL 34 (L) 11/16/2019 1453   CHOLHDL 4 02/14/2020 1035   VLDL 24.0 02/14/2020 1035   LDLCALC 61 02/14/2020 1035   LDLCALC 64 11/16/2019 1453   LDLDIRECT 68.0 03/11/2019 1427      Wt Readings from Last 3 Encounters:  06/15/21 (!) 320 lb 3.2 oz (145.2 kg)  04/10/21 (!) 315 lb (142.9 kg)  03/27/21 (!) 315 lb (142.9 kg)      Other studies Reviewed: NM Stress Test: 04/30/2021  Nuclear stress EF: 56%. The left ventricular ejection fraction is normal (55-65%). There was no ST segment deviation noted during stress. Defect 1: There is a medium defect of mild severity present in the basal inferior, mid inferior and apical inferior location. The study is normal. This is a low risk study.   1. Fixed inferior perfusion defect with normal wall motion, consistent with artifact 2. Low risk study Transthoracic Echocardiogram: Date: 10/03/19 Results: 1. Left ventricular ejection fraction, by visual estimation, is 55 to  60%. The left ventricle has normal function. Unable to assess for left  ventricular hypertrophy.   2. Definity contrast agent was given IV to delineate the left ventricular  endocardial borders.   3. Left ventricular diastolic parameters are indeterminate.   4. The left ventricle has no regional wall motion abnormalities.   5. Global right ventricle has normal systolic function.The right  ventricular size is mildly enlarged. No increase in right ventricular wall  thickness.   6. Left atrial size was normal.   7. Right atrial size was normal.   8. The mitral valve is grossly normal. No evidence of mitral  valve  regurgitation.   9. The tricuspid  valve is grossly normal.  10. The tricuspid valve is grossly normal. Tricuspid valve regurgitation  is not demonstrated.  11. The aortic valve is tricuspid. Aortic valve regurgitation is not  visualized. No evidence of aortic valve sclerosis or stenosis.  12. The pulmonic valve was not well visualized. Pulmonic valve  regurgitation is not visualized.  13. The inferior vena cava is dilated in size with >50% respiratory  variability, suggesting right atrial pressure of 8 mmHg.  14. The aortic root was not well visualized.    Cardiac CT: Date: 08/10/2013 Results: IMPRESSION: 1. Prominent mixed plaque in the proximal LAD in the area of the D2 origin. There appears to be up to moderate stenosis.   2. Age-advanced coronary artery calcification with calcium score 238 Agatston units. This places the patient in the 95th percentile for his age and gender. This suggests that he is in a high risk cohort for future cardiac events.   NM Stress Testing : Date: 09/19/2015 Results: The left ventricular ejection fraction is normal (55-65%). Nuclear stress EF: 56%. No T wave inversion was noted during stress. There was no ST segment deviation noted during stress. Defect 1: There is a medium defect of moderate severity. Findings consistent with ischemia. This is an intermediate risk study.   Moderate size and intensity, reversible inferior wall defect. SDS scores not calculated, however, there appears to be significant inferior ischemia. There is mild inferior attenuation, which does not affect interpretation. LVEF 56%, normal wall motion. This is an intermediate risk study. Clinical correlation is advised.   Left/Right Heart Catheterizations: Date:10/04/19 Results: Mid LAD lesion is 60% stenosed. FFR was 0.78. A drug-eluting stent was successfully placed using a SYNERGY XD 3.0X20. Post intervention, there is a 0% residual stenosis. The left ventricular  systolic function is normal. LV end diastolic pressure is normal. LVEDP 14 mm Hg. The left ventricular ejection fraction is 55-65% by visual estimate. There is no aortic valve stenosis.   ASSESSMENT AND PLAN:  1.  CAD: Non-obstructive. Hx of PCI to the LAD.  He does not complain of any recurrent chest pain, he is medically compliant. Will continue secondary management. NM stress was negative for new area's of ischemia.   2. OSA: He has not had CPAP institution since having his sleep study in 2021.  We talked about this, and his ongoing DOE and daytime sleepiness.  He is interested in talking to Barry Brunner concerning CPAP and the process of getting him this device.  She has been contacted.   3. Hypertension: BP is well controlled. Labs are being followed by his PCP, Dr. Yong Channel.   4. Type II Diabetes: Followed by his PCP.He mentions that his blood glucose has been "up and down" lately and he is to discuss with Dr. Yong Channel on upcoming appointment.   5. Metabolic Syndrome:  Continue to treat with secondary prevention measures to slow or stop progression of CAD and hyperlipidemia. Would like to see him be more active and lose weight to improve overall health.   6. Hyperlipidemia: Remains rosuvastatin with minimal myalgia pain.  Goal of LDL < 70. Most recent lab value 02/2020 was 61.    Current medicines are reviewed at length with the patient today.  I have spent 25 minutes dedicated to the care of this patient on the date of this encounter to include pre-visit review of records, assessment, management and diagnostic testing,with shared decision making.  Labs/ tests ordered today include: None   Phill Myron. West Pugh, ANP, AACC  06/15/2021 9:25 AM    [facility] [address] Office [phone] Fax [phone]  Notice: This dictation was prepared with Dragon dictation along with smaller phrase technology. Any transcriptional errors that result  from this process are unintentional and may not be corrected upon review.

## 2021-06-15 ENCOUNTER — Other Ambulatory Visit: Payer: Self-pay

## 2021-06-15 ENCOUNTER — Encounter: Payer: Self-pay | Admitting: Adult Health

## 2021-06-15 ENCOUNTER — Ambulatory Visit (INDEPENDENT_AMBULATORY_CARE_PROVIDER_SITE_OTHER): Payer: Self-pay | Admitting: Adult Health

## 2021-06-15 VITALS — BP 132/74 | HR 74 | Ht 69.0 in | Wt 320.2 lb

## 2021-06-15 DIAGNOSIS — I10 Essential (primary) hypertension: Secondary | ICD-10-CM

## 2021-06-15 DIAGNOSIS — I2510 Atherosclerotic heart disease of native coronary artery without angina pectoris: Secondary | ICD-10-CM

## 2021-06-15 DIAGNOSIS — K219 Gastro-esophageal reflux disease without esophagitis: Secondary | ICD-10-CM

## 2021-06-15 DIAGNOSIS — E669 Obesity, unspecified: Secondary | ICD-10-CM

## 2021-06-15 DIAGNOSIS — E8881 Metabolic syndrome: Secondary | ICD-10-CM

## 2021-06-15 DIAGNOSIS — E119 Type 2 diabetes mellitus without complications: Secondary | ICD-10-CM

## 2021-06-15 DIAGNOSIS — G4733 Obstructive sleep apnea (adult) (pediatric): Secondary | ICD-10-CM

## 2021-06-15 DIAGNOSIS — E1169 Type 2 diabetes mellitus with other specified complication: Secondary | ICD-10-CM

## 2021-06-15 DIAGNOSIS — E785 Hyperlipidemia, unspecified: Secondary | ICD-10-CM

## 2021-06-15 NOTE — Patient Instructions (Signed)
Medication Instructions:  No changes *If you need a refill on your cardiac medications before your next appointment, please call your pharmacy*   Lab Work: No Labs  If you have labs (blood work) drawn today and your tests are completely normal, you will receive your results only by: [place] (if you have MyChart) OR A paper copy in the mail If you have any lab test that is abnormal or we need to change your treatment, we will call you to review the results.   Testing/Procedures: No Testing   Follow-Up: At [facility], you and your health needs are our priority.  As part of our continuing mission to provide you with exceptional heart care, we have created designated Provider Care Teams.  These Care Teams include your primary Cardiologist (physician) and Advanced Practice Providers (APPs -  Physician Assistants and Nurse Practitioners) who all work together to provide you with the care you need, when you need it.  We recommend signing up for the patient portal called "MyChart".  Sign up information is provided on this After Visit Summary.  MyChart is used to connect with patients for Virtual Visits (Telemedicine).  Patients are able to view lab/test results, encounter notes, upcoming appointments, etc.  Non-urgent messages can be sent to your provider as well.   To learn more about what you can do with MyChart, go to [email or web address].    Your next appointment:   6 month(s)  The format for your next appointment:   In Person  Provider:   Minus Breeding, MD

## 2021-06-19 NOTE — Telephone Encounter (Signed)
[facility] is calling in stating they are changing the manufacture of Levothyroxine and [facility] is needing an okay to change prescription.

## 2021-06-20 NOTE — Telephone Encounter (Signed)
OK given to change medication

## 2021-06-21 ENCOUNTER — Other Ambulatory Visit: Payer: Self-pay | Admitting: Family Medicine

## 2021-06-22 ENCOUNTER — Telehealth: Payer: Self-pay | Admitting: *Deleted

## 2021-06-22 DIAGNOSIS — G4733 Obstructive sleep apnea (adult) (pediatric): Secondary | ICD-10-CM

## 2021-06-22 NOTE — Telephone Encounter (Signed)
-----   Message from Lauralee Evener, [place] sent at 06/20/2021  3:14 PM EDT ----- Gae Bon this was read by TT can you look into this please. ----- Message ----- From: Monia Pouch, CMA Sent: 06/15/2021   9:23 AM EDT To: Lauralee Evener, CMA  Tobias Alexander ;  Mr. Smolinsky had his sleep study in 4/21 he did not receive a C-pap. Can you call him when you have time.  Thanks, Colletta [place]

## 2021-06-22 NOTE — Telephone Encounter (Signed)
Reached out to patient to see if he has insurance now and he is not insured so he understands he will be billed directly and can file for patient assistance once the bill comes out. Titraion to be scheduled

## 2021-07-23 NOTE — Telephone Encounter (Signed)
Patient is scheduled for CPAP Titration on 08/24/21. Patient understands his titration study will be done at [facility] sleep lab. Patient understands he will receive a letter in a week or so detailing appointment, date, time, and location. Patient understands to call if he does not receive the letter  in a timely manner. Patient agrees with treatment and thanked me for call.

## 2021-08-21 ENCOUNTER — Telehealth: Payer: Self-pay

## 2021-08-21 NOTE — Telephone Encounter (Signed)
Letter has been sent to patient instructing them to call us if they are still interested in completing their sleep study. If we have not received a response from the patient within 30 days of this notice, the order will be cancelled and they will need to discuss the need for a sleep study at their next office visit.  ° °

## 2021-08-24 ENCOUNTER — Encounter (HOSPITAL_BASED_OUTPATIENT_CLINIC_OR_DEPARTMENT_OTHER): Payer: Self-pay | Admitting: Cardiology

## 2021-09-17 ENCOUNTER — Telehealth: Payer: Self-pay

## 2021-09-17 NOTE — Telephone Encounter (Signed)
Patient scheduled for 1/10 w/ [facility].    Patient Name: Gabriel [name] Gender: Male DOB: [birth year] Age: 56 Y 65 M 27 D Return Phone Number: [phone] (Primary) Address: City/ State/ Zip: [place]  [address] Client [place] [facility] [place] [facility] [place] [facility] Provider Garret Reddish- MD Contact Type Call Who Is Calling Patient / Member / Family / Caregiver Call Type Triage / Clinical Relationship To Patient Self Return Phone Number [phone] (Primary) Chief Complaint Blood Sugar High Reason for Call Symptomatic / Request for Health Information Initial Comment Caller states she is calling from the office to get a pt triage. Caller states pt sugar level is over 300 few days ago, but now it is at 199. Caller states he is also urinating alot around 30 times a day and also feels he has a UTI. Translation No Nurse Assessment Nurse: Doyle Askew, RN, Beth Date/Time (Eastern Time): 09/17/2021 1:53:16 PM Confirm and document reason for call. If symptomatic, describe symptoms. ---Caller states pt sugar level is over 300 few days ago, but now it is at 199. Caller states he is also urinating a lot around 30 times a day and also feels he has a UTI. Does the patient have any new or worsening symptoms? ---Yes Will a triage be completed? ---Yes Related visit to physician within the last 2 weeks? ---No Does the PT have any chronic conditions? (i.e. diabetes, asthma, this includes High risk factors for pregnancy, etc.) ---Yes List chronic conditions. ---diabetes, HTN Is this a behavioral health or substance abuse call? ---No Guidelines Guideline Title Affirmed Question Affirmed Notes Nurse Date/Time (Eastern Time) Diabetes - High Blood Sugar [1] Blood glucose 240 - 300 mg/dL (13.3 - 16.7 mmol/ L) AND [2] uses insulin (e.g., insulinMyers, RN, Beth 09/17/2021 1:55:03 PM  Guidelines Guideline Title Affirmed  Question Affirmed Notes Nurse Date/Time (Eastern Time) dependent, all people with type 1 diabetes) Urination Pain - Male Diabetes mellitus or weak immune system (e.g., HIV positive, cancer chemo, splenectomy, organ transplant, chronic steroids) Doyle Askew, RN, Beth 09/17/2021 1:58:30 PM Disp. Time Eilene Ghazi Time) Disposition Final User 09/17/2021 1:58:18 [place], RN, [facility] 09/17/2021 2:01:59 PM See HCP within 4 Hours (or PCP triage) Yes Doyle Askew, RN, Beth Caller Disagree/Comply Comply Caller Understands Yes PreDisposition Call Doctor Care Advice Given Per Guideline HOME CARE: * You should be able to treat this at home. * Drink at least one glass (8 oz; 240 ml) of water per hour for the next 4 hours. Reason: Adequate hydration will help lower blood sugar. TREATMENT - LIQUIDS: * Try to drink 6 to 8 glasses of water each day. * Symptoms of mildly high blood sugar: Frequent urination (peeing), increased thirst, fatigue, blurred vision. HIGH BLOOD SUGAR (HYPERGLYCEMIA): * Symptoms of severely high blood sugar: Confusion and coma. MEASURE AND RECORD YOUR BLOOD GLUCOSE: * Measure your blood glucose before breakfast and before going to bed. * Keep a log and show it to your doctor (or NP/PA) at your next office visit. * You become worse * You have more questions * Rapid breathing occurs * Vomiting lasting over 4 hours or unable to drink any fluids * Urine ketones become moderate or large (or more than 1+); if you check blood ketones, blood ketone test is over 1.4 mmol/L CALL BACK IF: * Blood glucose over 300 mg/dL (16.7 mmol/L), two or more times in a row. CARE ADVICE given per Diabetes - High Blood Sugar (Adult) guideline. SEE  HCP (OR PCP TRIAGE) WITHIN 4 HOURS: * IF OFFICE WILL BE OPEN: You need to be seen within the next 3 or 4 hours. Call your doctor (or NP/PA) now or as soon as the office opens. * You become worse CALL BACK IF: CARE ADVICE given per Urination Pain - Male (Adult) guideline.  BRING MEDS: * Be certain to bring your medications with you when you go to see the doctor. Referrals REFERRED TO PCP OFFICE

## 2021-09-18 ENCOUNTER — Other Ambulatory Visit: Payer: Self-pay | Admitting: Family Medicine

## 2021-09-18 ENCOUNTER — Other Ambulatory Visit: Payer: Self-pay

## 2021-09-18 ENCOUNTER — Ambulatory Visit (INDEPENDENT_AMBULATORY_CARE_PROVIDER_SITE_OTHER): Payer: Self-pay | Admitting: Physician Assistant

## 2021-09-18 ENCOUNTER — Encounter: Payer: Self-pay | Admitting: Physician Assistant

## 2021-09-18 VITALS — BP 110/60 | HR 88 | Temp 98.3°F | Ht 69.0 in | Wt 316.0 lb

## 2021-09-18 DIAGNOSIS — E119 Type 2 diabetes mellitus without complications: Secondary | ICD-10-CM

## 2021-09-18 DIAGNOSIS — R300 Dysuria: Secondary | ICD-10-CM

## 2021-09-18 DIAGNOSIS — R3589 Other polyuria: Secondary | ICD-10-CM

## 2021-09-18 LAB — POCT URINALYSIS DIPSTICK
Bilirubin, UA: NEGATIVE
Blood, UA: NEGATIVE
Glucose, UA: NEGATIVE
Ketones, UA: NEGATIVE
Leukocytes, UA: NEGATIVE
Nitrite, UA: NEGATIVE
Protein, UA: NEGATIVE
Spec Grav, UA: >=1.03 — AB (ref 1.010–1.025)
Urobilinogen, UA: 0.2 E.U./dL
pH, UA: 5 (ref 5.0–8.0)

## 2021-09-18 LAB — POCT GLYCOSYLATED HEMOGLOBIN (HGB A1C): Hemoglobin A1C: 8.6 % — AB (ref 4.0–5.6)

## 2021-09-18 MED ORDER — OXYBUTYNIN CHLORIDE ER 5 MG PO TB24: 5.0000 mg | ORAL_TABLET | Freq: Every day | ORAL | 1 refills | Status: DC

## 2021-09-18 NOTE — Patient Instructions (Addendum)
It was great to see you!  We can trial oxybutynin 5 mg daily for your bladder I'm also putting in a referral to urology  We are updating your kidney function today  Please continue 7 units insulin twice daily. Do not decrease this dose unless blood sugars are consistently under 120  Please follow-up with Dr. Yong Channel in 1 month to follow-up on these issues   Take care,  Inda Coke PA-C

## 2021-09-18 NOTE — Progress Notes (Signed)
Gabriel [name] is a 56 y.o. male here for a follow up of Diabetes Mellitus.  History of Present Illness:   Chief Complaint  Patient presents with   Diabetes    Pt has been elevated for the past months. Thursday and Friday sugars were over 300. This morning was 205.    HPI  Diabetes Artyom presents today due to concerns of elevated blood sugars. According to Jaxxson, his blood sugars have elevated for the past couple of months, but have reached over 300 within the last couple of days. Due to this he has experienced brain fog, headache, and fatigue. Currently patient is compliant with taking metformin 1000 mg BID, Amaryl 4 mg twice daily, and Novolin 7 units twice daily injection .   At this time Jasson is checking his blood sugar 3 times a day. Admits that he does adjust his insulin intake daily dependent on his blood sugar reading. Reports if his levels are at least 200 or higher he does 7 units of novolin, if it is below 200 then he will do 5 units. He finds that upon waking up his levels are in the 200's and prior to going to bed they are mid-100's. His lowest blood sugar reading last week was 150. Denies: hypoglycemic or hyperglycemic episodes or symptoms.   Lab Results  Component Value Date   HGBA1C 8.6 (A) 09/18/2021   Frequent Urination In addition to elevated blood sugar levels, pt admits that he has been experiencing frequent urination and slight burning with urination. States that he has had to urinate about 30 times daily. Mr. Storie has been seen about this issue before on 02/22/20 by Dr. Yong Channel where he was experiencing similar sx. At that time he was having to go to the restroom about 25 times daily and sleep with a urinal by his bed. Although he was urinating frequently, he was only outputting small amounts. Following this visit he was recommended to decrease his then water intake of 100 oz a day to at most 64 oz.   Currently Garret is drinking about 64 oz of water but is  still dealing with this issue. Additionally he is having back pain which leads him to believe that this issue could be a UTI. Despite this belief he is interested in following up with urology for further evaluation if an antibiotic does not improve symptoms. Pt does have a hx of kidney stones but has always passed them. Denies hematuria, changes in BMs, urinary/bowel incontinence.   Lab Results  Component Value Date   PSA 0.48 05/30/2020   PSA 0.4 06/25/2019       Past Medical History:  Diagnosis Date   Arthritis    "joints; from where I've had surgeries" (08/17/2013)   Chest pain    a. 08/2013 Cardia CTA: Ca score 238 (95%), LM nl, LAD mod stenosis in D2 area, D1 mild ost stenosis, D2 no signif dzs, LCX small, RCA nl.   Coronary artery disease    LHC (08/18/13):  pLAD 50, CFX and RCA normal.  EF 55-65%.  LAD lesion FFR:  0.88 (normal).  Med rx recommended.     Dysphagia    Upper endoscopy 11/2013 - without obvious stricture s/p Maloney dilation.    GERD (gastroesophageal reflux disease)    Hyperlipidemia    a. Dx 3y ago - not on statin.   Hypertension    a. Dx 3y ago.   Hypothyroidism    a. on replacement.   IBS (irritable bowel  syndrome)    colonoscopy 2000   Obesity    Sleep apnea    does not use CPAP   Snoring    Type II diabetes mellitus (HCC)      Social History   Tobacco Use   Smoking status: Former    Packs/day: 1.50    Years: 24.00    Pack years: 36.00    Types: Cigarettes    Quit date: 09/10/2007    Years since quitting: 14.0   Smokeless tobacco: Never  Vaping Use   Vaping Use: Never used  Substance Use Topics   Alcohol use: No   Drug use: No    Past Surgical History:  Procedure Laterality Date   ANKLE SURGERY Right    "had a growth in it; cut it out" (08/17/2013)   CARDIAC CATHETERIZATION N/A 09/28/2015   Procedure: Left Heart Cath and Coronary Angiography;  Surgeon: Peter M Martinique, MD;  Location: [place] [facility];  Service:  Cardiovascular;  Laterality: N/A;   CHOLECYSTECTOMY     CORONARY STENT INTERVENTION N/A 10/04/2019   Procedure: CORONARY STENT INTERVENTION;  Surgeon: Jettie Booze, MD;  Location: [place] [facility];  Service: Cardiovascular;  Laterality: N/A;   INTRAVASCULAR PRESSURE WIRE/FFR STUDY N/A 10/04/2019   Procedure: INTRAVASCULAR PRESSURE WIRE/FFR STUDY;  Surgeon: Jettie Booze, MD;  Location: [place] [facility];  Service: Cardiovascular;  Laterality: N/A;   KNEE ARTHROSCOPY Right X 2   LEFT HEART CATH AND CORONARY ANGIOGRAPHY N/A 10/04/2019   Procedure: LEFT HEART CATH AND CORONARY ANGIOGRAPHY;  Surgeon: Jettie Booze, MD;  Location: [place] [facility];  Service: Cardiovascular;  Laterality: N/A;   LEFT HEART CATHETERIZATION WITH CORONARY ANGIOGRAM N/A 08/18/2013   Procedure: LEFT HEART CATHETERIZATION WITH CORONARY ANGIOGRAM;  Surgeon: Peter M Martinique, MD;  Location: [facility];  Service: Cardiovascular;  Laterality: N/A;   SHOULDER ARTHROSCOPY W/ ROTATOR CUFF REPAIR Left     Family History  Adopted: Yes  Problem Relation Age of Onset   Hypertension Mother    Obesity Mother    Diabetes Mother    Liver cancer Mother        with mets to the GB   Coronary artery disease Father        s/p MI in his early 30's->CABG x 5 @ age 92, currently 23.   Diabetes Father    Stroke Father    Hypertension Father    Diabetes Brother    Obesity Brother    Asthma Maternal Grandfather    Colon cancer Neg Hx    Esophageal cancer Neg Hx    Inflammatory bowel disease Neg Hx    Liver disease Neg Hx    Pancreatic cancer Neg Hx    Rectal cancer Neg Hx    Stomach cancer Neg Hx     No Known Allergies  Current Medications:   Current Outpatient Medications:    acetaminophen (TYLENOL) 500 MG tablet, Take 1,000 mg by mouth every 6 (six) hours as needed for mild pain or moderate pain. Reported on 01/09/2016, Disp: , Rfl:    albuterol (VENTOLIN HFA) 108 (90 Base)  MCG/ACT inhaler, Inhale 2 puffs into the lungs every 6 (six) hours as needed for wheezing or shortness of breath., Disp: 8 g, Rfl: 0   aspirin EC 81 MG EC tablet, Take 1 tablet (81 mg total) by mouth daily., Disp: , Rfl:    cyclobenzaprine (FLEXERIL) 10 MG tablet, Take 1 tablet (10 mg total) by mouth at  bedtime as needed for muscle spasms., Disp: 14 tablet, Rfl: 0   glimepiride (AMARYL) 4 MG tablet, TAKE 2 TABLETS BY MOUTH ONCE DAILY BEFORE BREAKFAST, Disp: 180 tablet, Rfl: 0   glucose blood (ONE TOUCH TEST STRIPS) test strip, Up to 4 times daily.  Onetouch Verio.  E11.65, Disp: 100 each, Rfl: 12   insulin NPH Human (NOVOLIN N) 100 UNIT/ML injection, Inject 0.04-0.15 mLs (4-15 Units total) into the skin 2 (two) times daily before a meal. (Patient taking differently: Inject 7 Units into the skin 2 (two) times daily before a meal.), Disp: 10 mL, Rfl: 11   Lancets (ONETOUCH ULTRASOFT) lancets, Up to 4 times daily.  Onetouch Verio.  DxE11.65, Disp: 100 each, Rfl: 12   levothyroxine (SYNTHROID) 125 MCG tablet, TAKE 1 TABLET BY MOUTH ONCE DAILY BEFORE BREAKFAST, Disp: 90 tablet, Rfl: 0   losartan (COZAAR) 100 MG tablet, Take 1 tablet (100 mg total) by mouth daily., Disp: 90 tablet, Rfl: 3   metFORMIN (GLUCOPHAGE) 1000 MG tablet, TAKE 1 TABLET BY MOUTH TWICE DAILY WITH A MEAL, Disp: 180 tablet, Rfl: 0   Multiple Vitamins-Minerals (MULTIVITAMIN,TX-MINERALS) tablet, Take 1 tablet by mouth daily., Disp: , Rfl:    nitroGLYCERIN (NITROSTAT) 0.4 MG SL tablet, Place 1 tablet (0.4 mg total) under the tongue every 5 (five) minutes as needed for chest pain (CP or SOB)., Disp: 25 tablet, Rfl: 3   Omega-3 Fatty Acids (FISH OIL) 1000 MG CAPS, Take 1,000 mg by mouth every other day., Disp: , Rfl:    omeprazole (PRILOSEC) 20 MG capsule, Take 1 capsule (20 mg total) by mouth daily., Disp: 30 capsule, Rfl: 3   rosuvastatin (CRESTOR) 5 MG tablet, Take 1 tablet (5 mg total) by mouth daily., Disp: 30 tablet, Rfl:  11   Review of Systems:   ROS Negative unless otherwise specified per HPI. Vitals:   Vitals:   09/18/21 1519  BP: 110/60  Pulse: 88  Temp: 98.3 F (36.8 C)  TempSrc: Temporal  SpO2: 95%  Weight: (!) 316 lb (143.3 kg)  Height: 5\' 9"  (1.753 m)     Body mass index is 46.67 kg/m.  Physical Exam:   Physical Exam Vitals and nursing note reviewed.  Constitutional:      General: He is not in acute distress.    Appearance: He is well-developed. He is not ill-appearing or toxic-appearing.  Cardiovascular:     Rate and Rhythm: Normal rate and regular rhythm.     Pulses: Normal pulses.     Heart sounds: Normal heart sounds, S1 normal and S2 normal.  Pulmonary:     Effort: Pulmonary effort is normal.     Breath sounds: Normal breath sounds.  Skin:    General: Skin is warm and dry.  Neurological:     Mental Status: He is alert.     GCS: GCS eye subscore is 4. GCS verbal subscore is 5. GCS motor subscore is 6.  Psychiatric:        Speech: Speech normal.        Behavior: Behavior normal. Behavior is cooperative.   Results for orders placed or performed in visit on 09/18/21  POCT glycosylated hemoglobin (Hb A1C)  Result Value Ref Range   Hemoglobin A1C 8.6 (A) 4.0 - 5.6 %  POCT urinalysis dipstick  Result Value Ref Range   Color, UA YELLOW    Clarity, UA CLEAR    Glucose, UA Negative Negative   Bilirubin, UA NEG    Ketones, UA NEG  Spec Grav, UA >=1.030 (A) 1.010 - 1.025   Blood, UA NEG    pH, UA 5.0 5.0 - 8.0   Protein, UA Negative Negative   Urobilinogen, UA 0.2 0.2 or 1.0 E.U./dL   Nitrite, UA neg    Leukocytes, UA Negative Negative   Appearance     Odor       Assessment and Plan:   Diabetes mellitus without complication (HCC) [id number] remains uncontrolled -- currently 8.6% Continue metformin 1000 mg, Amaryl 4 mg BID,  and Novolin 7 mg injection twice daily  Informed patient to not insulin dosage down unless blood sugars are consistently under  120 Encouraged compliance Follow-up with Dr Yong Channel in 1 MONTH, sooner if concerns  Dysuria/Urinary Frequency No red flags on exam No evidence of infection on UA Will send urine culture and add abx if indicated Trial oxybutynin ER 5 mg daily for possible OAB Will put in referral to urology for further evaluation  I,Havlyn C Ratchford,acting as a scribe for [employer], PA.,have documented all relevant documentation on the behalf of Inda Coke, PA,as directed by  Inda Coke, PA while in the presence of Inda Coke, [place].  I, Inda Coke, [place], have reviewed all documentation for this visit. The documentation on 09/18/21 for the exam, diagnosis, procedures, and orders are all accurate and complete.   Inda Coke, PA-C

## 2021-09-19 LAB — COMPREHENSIVE METABOLIC PANEL
ALT: 68 U/L — ABNORMAL HIGH (ref 0–53)
AST: 54 U/L — ABNORMAL HIGH (ref 0–37)
Albumin: 4.2 g/dL (ref 3.5–5.2)
Alkaline Phosphatase: 75 U/L (ref 39–117)
BUN: 16 mg/dL (ref 6–23)
CO2: 25 mEq/L (ref 19–32)
Calcium: 9 mg/dL (ref 8.4–10.5)
Chloride: 101 mEq/L (ref 96–112)
Creatinine, Ser: 0.98 mg/dL (ref 0.40–1.50)
GFR: 87.48 mL/min (ref >60.00)
Glucose, Bld: 152 mg/dL — ABNORMAL HIGH (ref 70–99)
Potassium: 3.9 mEq/L (ref 3.5–5.1)
Sodium: 135 mEq/L (ref 135–145)
Total Bilirubin: 0.9 mg/dL (ref 0.2–1.2)
Total Protein: 6.9 g/dL (ref 6.0–8.3)

## 2021-09-19 LAB — URINE CULTURE
MICRO NUMBER:: 12851026
Result:: NO GROWTH
SPECIMEN QUALITY:: ADEQUATE

## 2021-09-20 ENCOUNTER — Other Ambulatory Visit: Payer: Self-pay | Admitting: Physician Assistant

## 2021-09-20 DIAGNOSIS — R7989 Other specified abnormal findings of blood chemistry: Secondary | ICD-10-CM

## 2021-09-27 ENCOUNTER — Other Ambulatory Visit: Payer: Self-pay | Admitting: Family Medicine

## 2021-09-30 DIAGNOSIS — E785 Hyperlipidemia, unspecified: Secondary | ICD-10-CM | POA: Insufficient documentation

## 2021-09-30 NOTE — Progress Notes (Signed)
Cardiology Office Note   Date:  10/01/2021   ID:  Stanford, Strauch 03/21/1966, MRN [id number]  PCP:  Marin Olp, MD  Cardiologist:   Minus Breeding, MD   Chief Complaint  Patient presents with   Chest Pain      History of Present Illness: Gabriel [name] is a 56 y.o. male who presents for follow up of CAD which is being managed medically.   He has a history of an LAD stenosis of 50% demonstrated in December 2014. He had a normal  FFR at that time.  He was in the emergency room however in 2017 with chest discomfort. He was seen in consultation. It was felt to be somewhat atypical pain. He had some chest discomfort with burping. It did not respond to treatment with an antacid. His cardiac markers were negative in the ER. He did report having used nitroglycerin about one month prior to this. He had a stress perfusion study which demonstrated an EF of 56%. There was a medium-sized moderate defect in the basal inferoseptal mid anteroseptal and mid inferoseptal apical region.  He had a cardiac cath on 09/28/15 and was found to have 40% LAD stenosis.   He is status post cardiac catheterization in 2021 with PCI to the LAD.  On  03/27/2021 he talked to Dr. Gasper Sells, via telemedicine visit. He had chest pain and a nuclear medicine stress test was ordered.  This was negative for ischemia in Jan of last year.    He reports some pain last week.  He says this was sharp and similar to the pain that he had last year at the time of his negative stress test.  He also has some back pain.  He did take 3 nitroglycerin.  He has to do this about 2 or 3 times in 6 months.  Since that pain he was able to do work at [employer] riding a bike.  With that he did not bring any discomfort he said.  The discomfort that he did have was mild.  He thinks it came and went with the nitroglycerin.  He had some mild shortness of breath.  He has not had any PND or orthopnea.  He has not any palpitations, presyncope or  syncope.   Past Medical History:  Diagnosis Date   Arthritis    "joints; from where I've had surgeries" (08/17/2013)   Chest pain    a. 08/2013 Cardia CTA: Ca score 238 (95%), LM nl, LAD mod stenosis in D2 area, D1 mild ost stenosis, D2 no signif dzs, LCX small, RCA nl.   Coronary artery disease    LHC (08/18/13):  pLAD 50, CFX and RCA normal.  EF 55-65%.  LAD lesion FFR:  0.88 (normal).  Med rx recommended.     Dysphagia    Upper endoscopy 11/2013 - without obvious stricture s/p Maloney dilation.    GERD (gastroesophageal reflux disease)    Hyperlipidemia    a. Dx 3y ago - not on statin.   Hypertension    a. Dx 3y ago.   Hypothyroidism    a. on replacement.   IBS (irritable bowel syndrome)    colonoscopy 2000   Obesity    Sleep apnea    does not use CPAP   Snoring    Type II diabetes mellitus ([place])     Past Surgical History:  Procedure Laterality Date   ANKLE SURGERY Right    "had a growth in it; cut it  out" (08/17/2013)   CARDIAC CATHETERIZATION N/A 09/28/2015   Procedure: Left Heart Cath and Coronary Angiography;  Surgeon: Peter M Martinique, MD;  Location: [place] [facility];  Service: Cardiovascular;  Laterality: N/A;   CHOLECYSTECTOMY     CORONARY STENT INTERVENTION N/A 10/04/2019   Procedure: CORONARY STENT INTERVENTION;  Surgeon: Jettie Booze, MD;  Location: [place] [facility];  Service: Cardiovascular;  Laterality: N/A;   INTRAVASCULAR PRESSURE WIRE/FFR STUDY N/A 10/04/2019   Procedure: INTRAVASCULAR PRESSURE WIRE/FFR STUDY;  Surgeon: Jettie Booze, MD;  Location: [place] [facility];  Service: Cardiovascular;  Laterality: N/A;   KNEE ARTHROSCOPY Right X 2   LEFT HEART CATH AND CORONARY ANGIOGRAPHY N/A 10/04/2019   Procedure: LEFT HEART CATH AND CORONARY ANGIOGRAPHY;  Surgeon: Jettie Booze, MD;  Location: [place] [facility];  Service: Cardiovascular;  Laterality: N/A;   LEFT HEART CATHETERIZATION WITH CORONARY ANGIOGRAM N/A 08/18/2013   Procedure: LEFT  HEART CATHETERIZATION WITH CORONARY ANGIOGRAM;  Surgeon: Peter M Martinique, MD;  Location: [facility];  Service: Cardiovascular;  Laterality: N/A;   SHOULDER ARTHROSCOPY W/ ROTATOR CUFF REPAIR Left      Current Outpatient Medications  Medication Sig Dispense Refill   acetaminophen (TYLENOL) 500 MG tablet Take 1,000 mg by mouth every 6 (six) hours as needed for mild pain or moderate pain. Reported on 01/09/2016     aspirin EC 81 MG EC tablet Take 1 tablet (81 mg total) by mouth daily.     cyclobenzaprine (FLEXERIL) 10 MG tablet Take 1 tablet (10 mg total) by mouth at bedtime as needed for muscle spasms. 14 tablet 0   glimepiride (AMARYL) 4 MG tablet TAKE 2 TABLETS BY MOUTH ONCE DAILY BEFORE BREAKFAST 180 tablet 0   glucose blood (ONE TOUCH TEST STRIPS) test strip Up to 4 times daily.  Onetouch Verio.  E11.65 100 each 12   insulin NPH Human (NOVOLIN N) 100 UNIT/ML injection Inject 0.04-0.15 mLs (4-15 Units total) into the skin 2 (two) times daily before a meal. (Patient taking differently: Inject 7 Units into the skin 2 (two) times daily before a meal.) 10 mL 11   isosorbide mononitrate (IMDUR) 60 MG 24 hr tablet Take 1 tablet (60 mg total) by mouth daily. 90 tablet 3   Lancets (ONETOUCH ULTRASOFT) lancets Up to 4 times daily.  Onetouch Verio.  DxE11.65 100 each 12   levothyroxine (SYNTHROID) 125 MCG tablet TAKE 1 TABLET BY MOUTH ONCE DAILY BEFORE BREAKFAST 90 tablet 0   losartan (COZAAR) 100 MG tablet Take 1 tablet (100 mg total) by mouth daily. 90 tablet 3   metFORMIN (GLUCOPHAGE) 1000 MG tablet TAKE 1 TABLET BY MOUTH TWICE DAILY WITH A MEAL 180 tablet 0   Multiple Vitamins-Minerals (MULTIVITAMIN,TX-MINERALS) tablet Take 1 tablet by mouth daily.     nitroGLYCERIN (NITROSTAT) 0.4 MG SL tablet Place 1 tablet (0.4 mg total) under the tongue every 5 (five) minutes as needed for chest pain (CP or SOB). 25 tablet 3   Omega-3 Fatty Acids (FISH OIL) 1000 MG CAPS Take 1,000 mg by mouth every other day.      omeprazole (PRILOSEC) 20 MG capsule Take 1 capsule (20 mg total) by mouth daily. 30 capsule 3   oxybutynin (DITROPAN-XL) 5 MG 24 hr tablet Take 1 tablet (5 mg total) by mouth at bedtime. 90 tablet 1   rosuvastatin (CRESTOR) 5 MG tablet Take 1 tablet (5 mg total) by mouth daily. 30 tablet 11   albuterol (VENTOLIN HFA) 108 (90  Base) MCG/ACT inhaler Inhale 2 puffs into the lungs every 6 (six) hours as needed for wheezing or shortness of breath. (Patient not taking: Reported on 10/01/2021) 8 g 0   No current facility-administered medications for this visit.    Allergies:   Patient has no known allergies.    ROS:  Please see the history of present illness.   Otherwise, review of systems are positive for none.   All other systems are reviewed and negative.    PHYSICAL EXAM: VS:  BP 120/70    Pulse 67    Wt (!) 321 lb 3.2 oz (145.7 kg)    SpO2 98%    BMI 47.43 kg/m  , BMI Body mass index is 47.43 kg/m. GENERAL:  Well appearing NECK:  No jugular venous distention, waveform within normal limits, carotid upstroke brisk and symmetric, no bruits, no thyromegaly LUNGS:  Clear to auscultation bilaterally CHEST:  Unremarkable HEART:  PMI not displaced or sustained,S1 and S2 within normal limits, no S3, no S4, no clicks, no rubs, no murmurs ABD:  Flat, positive bowel sounds normal in frequency in pitch, no bruits, no rebound, no guarding, no midline pulsatile mass, no hepatomegaly, no splenomegaly EXT:  2 plus pulses throughout, no edema, no cyanosis no clubbing   EKG:  EKG is  ordered today. Sinus rhythm, rate 67, axis within normal limits, intervals within normal limits, no acute ST-T wave changes.  Recent Labs: 10/13/2020: Hemoglobin 13.1; Platelets 163 09/18/2021: ALT 68; BUN 16; Creatinine, Ser 0.98; Potassium 3.9; Sodium 135    Lipid Panel    Component Value Date/Time   CHOL 116 02/14/2020 1035   CHOL 118 11/16/2019 1453   TRIG 120.0 02/14/2020 1035   HDL 31.30 (L) 02/14/2020 1035   HDL  34 (L) 11/16/2019 1453   CHOLHDL 4 02/14/2020 1035   VLDL 24.0 02/14/2020 1035   LDLCALC 61 02/14/2020 1035   LDLCALC 64 11/16/2019 1453   LDLDIRECT 68.0 03/11/2019 1427      Wt Readings from Last 3 Encounters:  10/01/21 (!) 321 lb 3.2 oz (145.7 kg)  09/18/21 (!) 316 lb (143.3 kg)  06/15/21 (!) 320 lb 3.2 oz (145.2 kg)      Other studies Reviewed: Additional studies/ records that were reviewed today include: Labs Review of the above records demonstrates:  Please see elsewhere in the note.     ASSESSMENT AND PLAN:  CAD:   He had some chest discomfort.  However, this is similar to what he had last year when he had a negative stress test.  It is not particularly reproducible and he has been exercising since then.  I am going to manage this medically and add Imdur 60 mg daily.   GERD:     He continues to follow with GI for this.  SLEEP APNEA:  He is being treated with CPAP.    Hypertension:  The blood pressure is at target.  No change in therapy.  Dyslipidemia:   LDL was 61 when checked two years ago.  I will repeat this today.   Diabetes Mellitus:  A1c was 8.6.  I am going to suggest to his primary provider possibly of a GLP-1 ra  Obesity:    Hopefully he could lose weight with the above therapy.    Current medicines are reviewed at length with the patient today.  The patient does not have concerns regarding medicines.  The following changes have been made:  As above  Labs/ tests ordered today include:  Orders Placed This Encounter  Procedures   Lipid panel   EKG 12-Lead     Disposition:   FU with APP in 3 months or sooner if needed.      Signed, Minus Breeding, MD  10/01/2021 8:35 AM    [facility]

## 2021-10-01 ENCOUNTER — Other Ambulatory Visit: Payer: Self-pay

## 2021-10-01 ENCOUNTER — Encounter: Payer: Self-pay | Admitting: Cardiology

## 2021-10-01 ENCOUNTER — Ambulatory Visit (INDEPENDENT_AMBULATORY_CARE_PROVIDER_SITE_OTHER): Payer: Self-pay | Admitting: Cardiology

## 2021-10-01 VITALS — BP 120/70 | HR 67 | Wt 321.2 lb

## 2021-10-01 DIAGNOSIS — I10 Essential (primary) hypertension: Secondary | ICD-10-CM

## 2021-10-01 DIAGNOSIS — I25118 Atherosclerotic heart disease of native coronary artery with other forms of angina pectoris: Secondary | ICD-10-CM

## 2021-10-01 DIAGNOSIS — E785 Hyperlipidemia, unspecified: Secondary | ICD-10-CM

## 2021-10-01 LAB — LIPID PANEL
Chol/HDL Ratio: 3.7 ratio (ref 0.0–5.0)
Cholesterol, Total: 131 mg/dL (ref 100–199)
HDL: 35 mg/dL — ABNORMAL LOW (ref >39)
LDL Chol Calc (NIH): 69 mg/dL (ref 0–99)
Triglycerides: 158 mg/dL — ABNORMAL HIGH (ref 0–149)
VLDL Cholesterol Cal: 27 mg/dL (ref 5–40)

## 2021-10-01 MED ORDER — ISOSORBIDE MONONITRATE ER 60 MG PO TB24: 60.0000 mg | ORAL_TABLET | Freq: Every day | ORAL | 3 refills | Status: DC

## 2021-10-01 NOTE — Patient Instructions (Signed)
Medication Instructions:  START ISOSORBIDE 60 MG DAILY   *If you need a refill on your cardiac medications before your next appointment, please call your pharmacy*  Lab Work: LIPIDS TODAY   If you have labs (blood work) drawn today and your tests are completely normal, you will receive your results only by: [place] (if you have MyChart) OR A paper copy in the mail If you have any lab test that is abnormal or we need to change your treatment, we will call you to review the results.  Testing/Procedures: NONE    Follow-Up: At [facility], you and your health needs are our priority.  As part of our continuing mission to provide you with exceptional heart care, we have created designated Provider Care Teams.  These Care Teams include your primary Cardiologist (physician) and Advanced Practice Providers (APPs -  Physician Assistants and Nurse Practitioners) who all work together to provide you with the care you need, when you need it.  We recommend signing up for the patient portal called "MyChart".  Sign up information is provided on this After Visit Summary.  MyChart is used to connect with patients for Virtual Visits (Telemedicine).  Patients are able to view lab/test results, encounter notes, upcoming appointments, etc.  Non-urgent messages can be sent to your provider as well.   To learn more about what you can do with MyChart, go to [email or web address].    Your next appointment:   3 month(s)  The format for your next appointment:   In Person  Provider:   NP OR PA

## 2021-10-04 ENCOUNTER — Other Ambulatory Visit: Payer: Self-pay

## 2021-10-04 ENCOUNTER — Other Ambulatory Visit (INDEPENDENT_AMBULATORY_CARE_PROVIDER_SITE_OTHER): Payer: Self-pay

## 2021-10-04 DIAGNOSIS — R7989 Other specified abnormal findings of blood chemistry: Secondary | ICD-10-CM

## 2021-10-05 LAB — HEPATIC FUNCTION PANEL
ALT: 56 U/L — ABNORMAL HIGH (ref 0–53)
AST: 37 U/L (ref 0–37)
Albumin: 4.3 g/dL (ref 3.5–5.2)
Alkaline Phosphatase: 77 U/L (ref 39–117)
Bilirubin, Direct: 0.1 mg/dL (ref 0.0–0.3)
Total Bilirubin: 0.8 mg/dL (ref 0.2–1.2)
Total Protein: 7 g/dL (ref 6.0–8.3)

## 2021-11-12 NOTE — Progress Notes (Incomplete)
? ?Phone [phone] ?In person visit ?  ?Subjective:  ? ?Gabriel [name] is a 56 y.o. year old very pleasant male patient who presents for/with See problem oriented charting ?No chief complaint on file. ? ? ?This visit occurred during the SARS-CoV-2 public health emergency.  Safety protocols were in place, including screening questions prior to the visit, additional usage of staff PPE, and extensive cleaning of exam room while observing appropriate contact time as indicated for disinfecting solutions.  ? ?Past Medical History-  ?Patient Active Problem List  ? Diagnosis Date Noted  ? Dyslipidemia 09/30/2021  ? Myalgia 03/27/2021  ? Chest pain of uncertain etiology 09/11/7251  ? Colon cancer screening 10/27/2018  ? Midepigastric pain 09/27/2018  ? Change in bowel habits 09/27/2018  ? Family hx colonic polyps 09/27/2018  ? BRBPR (bright red blood per rectum) 09/27/2018  ? Left lateral epicondylitis 03/18/2016  ? GERD (gastroesophageal reflux disease) 02/01/2015  ? 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 Dysphagia 09/30/2013  ? Coronary Artery Disease 08/26/2013  ? Diabetes mellitus without complication ([place]) 66/44/0347  ? Morbid obesity ([place]) 08/10/2013  ? Essential hypertension 02/06/2011  ? Hypothyroidism 02/06/2011  ? Hyperlipidemia associated with type 2 diabetes mellitus ([place]) 02/06/2011  ? ? ?Medications- reviewed and updated ?Current Outpatient Medications  ?Medication Sig Dispense Refill  ? acetaminophen (TYLENOL) 500 MG tablet Take 1,000 mg by mouth every 6 (six) hours as needed for mild pain or moderate pain. Reported on 01/09/2016    ? albuterol (VENTOLIN HFA) 108 (90 Base) MCG/ACT inhaler Inhale 2 puffs into the lungs every 6 (six) hours as needed for wheezing or shortness of breath. (Patient not taking: Reported on 10/01/2021) 8 g 0  ? aspirin EC 81 MG EC tablet Take 1 tablet (81 mg total) by mouth daily.    ? cyclobenzaprine (FLEXERIL) 10 MG tablet Take 1 tablet (10 mg total) by mouth at bedtime as  needed for muscle spasms. 14 tablet 0  ? glimepiride (AMARYL) 4 MG tablet TAKE 2 TABLETS BY MOUTH ONCE DAILY BEFORE BREAKFAST 180 tablet 0  ? glucose blood (ONE TOUCH TEST STRIPS) test strip Up to 4 times daily.  Onetouch Verio.  E11.65 100 each 12  ? insulin NPH Human (NOVOLIN N) 100 UNIT/ML injection Inject 0.04-0.15 mLs (4-15 Units total) into the skin 2 (two) times daily before a meal. (Patient taking differently: Inject 7 Units into the skin 2 (two) times daily before a meal.) 10 mL 11  ? isosorbide mononitrate (IMDUR) 60 MG 24 hr tablet Take 1 tablet (60 mg total) by mouth daily. 90 tablet 3  ? Lancets (ONETOUCH ULTRASOFT) lancets Up to 4 times daily.  Onetouch Verio.  DxE11.65 100 each 12  ? levothyroxine (SYNTHROID) 125 MCG tablet TAKE 1 TABLET BY MOUTH ONCE DAILY BEFORE BREAKFAST 90 tablet 0  ? losartan (COZAAR) 100 MG tablet Take 1 tablet (100 mg total) by mouth daily. 90 tablet 3  ? metFORMIN (GLUCOPHAGE) 1000 MG tablet TAKE 1 TABLET BY MOUTH TWICE DAILY WITH A MEAL 180 tablet 0  ? Multiple Vitamins-Minerals (MULTIVITAMIN,TX-MINERALS) tablet Take 1 tablet by mouth daily.    ? nitroGLYCERIN (NITROSTAT) 0.4 MG SL tablet Place 1 tablet (0.4 mg total) under the tongue every 5 (five) minutes as needed for chest pain (CP or SOB). 25 tablet 3  ? Omega-3 Fatty Acids (FISH OIL) 1000 MG CAPS Take 1,000 mg by mouth every other day.    ? omeprazole (PRILOSEC) 20 MG capsule Take 1 capsule (20 mg  total) by mouth daily. 30 capsule 3  ? oxybutynin (DITROPAN-XL) 5 MG 24 hr tablet Take 1 tablet (5 mg total) by mouth at bedtime. 90 tablet 1  ? rosuvastatin (CRESTOR) 5 MG tablet Take 1 tablet (5 mg total) by mouth daily. 30 tablet 11  ? ?No current facility-administered medications for this visit.  ? ?  ?Objective:  ?There were no vitals taken for this visit. ?Gen: NAD, resting comfortably ?CV: RRR no murmurs rubs or gallops ?Lungs: CTAB no crackles, wheeze, rhonchi ?Abdomen: soft/nontender/nondistended/normal bowel sounds.  No rebound or guarding.  ?Ext: no edema ?Skin: warm, dry ?Neuro: grossly normal, moves all extremities ? ?*** ?  ? ?Assessment and Plan  ? ?#History of adenomatous colon polyp-detected 08/22/2020 with Dr. Rush Landmark with 3-year repeat planned ? ?# Diabetes- options limited due to insurance ?S: Medication:Metformin 1000 mg twice daily, NPH 5 units in the morning-increased to 7 units last visit with plan for up to 10 while on prednisone- back down to 7 units, glimepiride 8 mg ?CBGs- *** ?Exercise and diet- *** ?Lab Results  ?Component Value Date  ? HGBA1C 8.6 (A) 09/18/2021  ? HGBA1C 9.0 (H) 05/30/2020  ? HGBA1C 8.0 (H) 02/14/2020  ? ? A/P: *** ? ?# right hip pain- likely OA- improved on prednisone ?  ?#hypertension ?S: medication: Losartan 100 mg ?Home readings #s: *** ?BP Readings from Last 3 Encounters:  ?10/01/21 120/70  ?09/18/21 110/60  ?06/15/21 132/74  ?A/P: *** ?  ?#hyperlipidemia ?# CAD- no chest pain on plavix and aspirin- no SOB like had on brilinta ?S: Medication:Omega-3, rosuvastatin 5 mg daily ? atorvastatin 80 mg in the past. ?Lab Results  ?Component Value Date  ? CHOL 131 10/01/2021  ? HDL 35 (L) 10/01/2021  ? [place] 69 10/01/2021  ? LDLDIRECT 68.0 03/11/2019  ? TRIG 158 (H) 10/01/2021  ? CHOLHDL 3.7 10/01/2021  ? A/P: *** ? ?#hypothyroidism ?S: compliant On thyroid medication-Euthyrox 125 mcg ?Lab Results  ?Component Value Date  ? TSH 2.23 02/14/2020  ?  ?A/P:***  ?  ?# colon cancer screening- dropped off stool cards 05/2020 visit,  which would be good for 1 year. Insurance doesn't pay for colonoscopy or cologuard ?  ?There are no preventive care reminders to display for this patient. ? ?Recommended follow up: No follow-ups on file. ?Future Appointments  ?Date Time Provider [place]  ?11/26/2021  1:40 PM Marin Olp, MD [facility]  ?12/31/2021  9:00 AM Minus Breeding, MD [facility]  ? ? ?Lab/Order associations: ?No diagnosis found. ? ?No orders of the defined types were  placed in this encounter. ? ? ?I,Jada Bradford,acting as a scribe for Garret Reddish, MD.,have documented all relevant documentation on the behalf of Garret Reddish, MD,as directed by  Garret Reddish, MD while in the presence of Garret Reddish, MD. ? ?*** ?Return precautions advised.  ?Burnett Corrente ? ? ?

## 2021-11-26 ENCOUNTER — Ambulatory Visit: Payer: Self-pay | Admitting: Family Medicine

## 2021-11-26 DIAGNOSIS — I10 Essential (primary) hypertension: Secondary | ICD-10-CM

## 2021-11-26 DIAGNOSIS — I2510 Atherosclerotic heart disease of native coronary artery without angina pectoris: Secondary | ICD-10-CM

## 2021-11-26 DIAGNOSIS — E119 Type 2 diabetes mellitus without complications: Secondary | ICD-10-CM

## 2021-11-26 DIAGNOSIS — E039 Hypothyroidism, unspecified: Secondary | ICD-10-CM

## 2021-11-26 DIAGNOSIS — E785 Hyperlipidemia, unspecified: Secondary | ICD-10-CM

## 2021-11-26 DIAGNOSIS — E1169 Type 2 diabetes mellitus with other specified complication: Secondary | ICD-10-CM

## 2021-12-03 NOTE — Progress Notes (Signed)
? ?Phone [phone] ?In person visit ?  ?Subjective:  ? ?Gabriel [name] is a 56 y.o. year old very pleasant male patient who presents for/with See problem oriented charting ?Chief Complaint  ?Patient presents with  ? Follow-up  ? Diabetes  ? Hypertension  ? Hyperlipidemia  ? Hypothyroidism  ? right shoulder pain  ?  Pt c/o right shoulder pain, he states his heart doc took him off crestor this morning to see if that will help.  ? ? ?This visit occurred during the SARS-CoV-2 public health emergency.  Safety protocols were in place, including screening questions prior to the visit, additional usage of staff PPE, and extensive cleaning of exam room while observing appropriate contact time as indicated for disinfecting solutions.  ? ?Past Medical History-  ?Patient Active Problem List  ? Diagnosis Date Noted  ? Coronary Artery Disease 08/26/2013  ?  Priority: High  ? Diabetes mellitus without complication ([place]) 41/74/0814  ?  Priority: High  ? 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  Priority: Medium   ? Morbid obesity ([place]) 08/10/2013  ?  Priority: Medium   ? Essential hypertension 02/06/2011  ?  Priority: Medium   ? Hypothyroidism 02/06/2011  ?  Priority: Medium   ? Hyperlipidemia associated with type 2 diabetes mellitus ([place]) 02/06/2011  ?  Priority: Medium   ? Colon cancer screening 10/27/2018  ?  Priority: Low  ? Midepigastric pain 09/27/2018  ?  Priority: Low  ? Change in bowel habits 09/27/2018  ?  Priority: Low  ? Family hx colonic polyps 09/27/2018  ?  Priority: Low  ? BRBPR (bright red blood per rectum) 09/27/2018  ?  Priority: Low  ? Left lateral epicondylitis 03/18/2016  ?  Priority: Low  ? GERD (gastroesophageal reflux disease) 02/01/2015  ?  Priority: Low  ? Dysphagia 09/30/2013  ?  Priority: Low  ? Dyslipidemia 09/30/2021  ? Myalgia 03/27/2021  ? Chest pain of uncertain etiology 48/18/5631  ? ? ?Medications- reviewed and updated ?Current Outpatient Medications  ?Medication Sig Dispense Refill  ?  acetaminophen (TYLENOL) 500 MG tablet Take 1,000 mg by mouth every 6 (six) hours as needed for mild pain or moderate pain. Reported on 01/09/2016    ? albuterol (VENTOLIN HFA) 108 (90 Base) MCG/ACT inhaler Inhale 2 puffs into the lungs every 6 (six) hours as needed for wheezing or shortness of breath. 8 g 0  ? aspirin EC 81 MG EC tablet Take 1 tablet (81 mg total) by mouth daily.    ? cyclobenzaprine (FLEXERIL) 10 MG tablet Take 1 tablet (10 mg total) by mouth at bedtime as needed for muscle spasms. 14 tablet 0  ? glimepiride (AMARYL) 4 MG tablet TAKE 2 TABLETS BY MOUTH ONCE DAILY BEFORE BREAKFAST 180 tablet 0  ? glucose blood (ONE TOUCH TEST STRIPS) test strip Up to 4 times daily.  Onetouch Verio.  E11.65 100 each 12  ? isosorbide mononitrate (IMDUR) 60 MG 24 hr tablet Take 1 tablet (60 mg total) by mouth daily. 90 tablet 3  ? Lancets (ONETOUCH ULTRASOFT) lancets Up to 4 times daily.  Onetouch Verio.  DxE11.65 100 each 12  ? levothyroxine (SYNTHROID) 125 MCG tablet TAKE 1 TABLET BY MOUTH ONCE DAILY BEFORE BREAKFAST 90 tablet 0  ? losartan (COZAAR) 100 MG tablet Take 1 tablet (100 mg total) by mouth daily. 90 tablet 3  ? metFORMIN (GLUCOPHAGE) 1000 MG tablet TAKE 1 TABLET BY MOUTH TWICE DAILY WITH A MEAL 180 tablet 0  ?  Multiple Vitamins-Minerals (MULTIVITAMIN,TX-MINERALS) tablet Take 1 tablet by mouth daily.    ? nitroGLYCERIN (NITROSTAT) 0.4 MG SL tablet Place 1 tablet (0.4 mg total) under the tongue every 5 (five) minutes as needed for chest pain (CP or SOB). 25 tablet 3  ? Omega-3 Fatty Acids (FISH OIL) 1000 MG CAPS Take 1,000 mg by mouth every other day.    ? omeprazole (PRILOSEC) 20 MG capsule Take 1 capsule (20 mg total) by mouth daily. 30 capsule 3  ? silodosin (RAPAFLO) 8 MG CAPS capsule Take 8 mg by mouth daily.    ? insulin NPH Human (NOVOLIN N) 100 UNIT/ML injection Inject 0.05-0.15 mLs (5-15 Units total) into the skin 2 (two) times daily before a meal. 10 mL 11  ? ?No current facility-administered  medications for this visit.  ? ?  ?Objective:  ?BP (!) 110/54   Pulse 80   Temp (!) 97.3 ?F (36.3 ?C)   Ht '5\' 9"'$  (1.753 m)   Wt (!) 319 lb 3.2 oz (144.8 kg)   SpO2 94%   BMI 47.14 kg/m?  ?Gen: NAD, resting comfortably ?CV: RRR no murmurs rubs or gallops ?Lungs: CTAB no crackles, wheeze, rhonchi ?Ext: no edema ?Skin: warm, dry ?- positive neer, hawkin, and empty can test on the right shoulder- cannot push off with hand behind back without severe pain. Somewhat limited ROM In abduction to 145 degrees ? ?  ? ?Assessment and Plan  ? ? ?# Diabetes- options limited due to insurance ?S: Medication:Metformin 1000 mg twice daily, NPH 7  units in the mornings, glimepiride 8 mg ?-Cardiology would like for him to be on Ozempic if possible due to cardioprotective effect-concern is for cost from patient  ?CBGs- about 200 in the morning ?Lab Results  ?Component Value Date  ? HGBA1C 8.6 (A) 09/18/2021  ? HGBA1C 9.0 (H) 05/30/2020  ? HGBA1C 8.0 (H) 02/14/2020  ? A/P: poor control- Continue Insulin 7 units before breakfast, start 5 units before dinner. Update me in 1 week with your morning sugars through mychart. Continue other meds as they are. Check a1c but concerned may be higher ?-goal morning sugar 80-120 ?  ? ?#hypertension ?S: medication: Losartan 100 mg ?BP Readings from Last 3 Encounters:  ?12/31/21 (!) 110/54  ?12/31/21 118/62  ?10/01/21 120/70  ?A/P: Controlled. Continue current medications.  ? ?#hyperlipidemia ?# CAD ?S: Medication:Omega-3,rosuvastatin 5 mg (on hold at the moment per cardiology- diffuse joint pains particularly R shoulder), Imdur 60 mg ?-also on aspirin 81 mg ?Lab Results  ?Component Value Date  ? CHOL 131 10/01/2021  ? HDL 35 (L) 10/01/2021  ? [place] 69 10/01/2021  ? LDLDIRECT 68.0 03/11/2019  ? TRIG 158 (H) 10/01/2021  ? CHOLHDL 3.7 10/01/2021  ? A/P: CAD asymptomatic on imdur- no CP or SOB. Does have myalgias with statin- see below discussion- off short term. Otherwise continue current  medicines ?-Has been having some right shoulder pain-cardiology took him off statin this morning to see if that would help with the pain-plan is to trial off for 4 weeks-if pain improves may need PCSK9 inhibitor-otherwise he will restart ?-wonders if could be rotator cuff as well. Saw [place] ortho for similar on left shoulder years ago.  ?  ?#hypothyroidism ?S: compliant On thyroid medication-Euthyrox 125 mcg ?Lab Results  ?Component Value Date  ? TSH 2.23 02/14/2020  ? A/P:  hopefully stable- update TSH today. Continue current meds for now  ? ?#Concern for OAB vs BPH-was placed on oxybutynin extended release in January by  Inda Coke, PA and referred to urology.  Patient reports was changed to rapaflo 8 mg by urology-  he thinks it is a sugar related issue ? ?#morbid obesity status noted- Encouraged need for healthy eating, regular exercise, weight loss.  ? ?# right hip pain- likely OA- improved on prednisone in past- doing better lately. He thinks sciatica pains bothering him- inversion tablet was helpful ? ?Recommended follow up: Return in about 14 weeks (around 04/08/2022) for followup or sooner if needed.Schedule b4 you leave. ? ?Lab/Order associations: ?  ICD-10-CM   ?1. Essential hypertension  I10   ?  ?2. Hyperlipidemia associated with type 2 diabetes mellitus ([place])  E11.69   ? E78.5   ?  ?3. Diabetes mellitus without complication (HCC)  M41.5 CBC with Differential/Platelet  ?  Comprehensive metabolic panel  ?  Hemoglobin A1c  ?  ?4. Morbid obesity ([place]) Chronic E66.01   ?  ?5. Hypothyroidism, unspecified type  E03.9 TSH  ?  ? ?Meds ordered this encounter  ?Medications  ? insulin NPH Human (NOVOLIN N) 100 UNIT/ML injection  ?  Sig: Inject 0.05-0.15 mLs (5-15 Units total) into the skin 2 (two) times daily before a meal.  ?  Dispense:  10 mL  ?  Refill:  11  ? ? ?Return precautions advised.  ?Garret Reddish, MD ? ? ?

## 2021-12-25 ENCOUNTER — Other Ambulatory Visit: Payer: Self-pay | Admitting: Family Medicine

## 2021-12-30 NOTE — Progress Notes (Signed)
?  ?Cardiology Office Note ? ? ?Date:  12/31/2021  ? ?ID:  Gabriel [name], DOB June 26, 1966, MRN [id number] ? ?PCP:  Marin Olp, MD  ?Cardiologist:   Minus Breeding, MD ? ? ?Chief Complaint  ?Patient presents with  ? Joint Pain  ? ? ?  ?History of Present Illness: ?Gabriel [name] is a 56 y.o. male who presents for follow up of CAD which is being managed medically.   He has a history of an LAD stenosis of 50% demonstrated in December 2014. He had a normal  FFR at that time.  He was in the emergency room however in 2017 with chest discomfort. He was seen in consultation. It was felt to be somewhat atypical pain. He had some chest discomfort with burping. It did not respond to treatment with an antacid. His cardiac markers were negative in the ER. He did report having used nitroglycerin about one month prior to this. He had a stress perfusion study which demonstrated an EF of 56%. There was a medium-sized moderate defect in the basal inferoseptal mid anteroseptal and mid inferoseptal apical region.  He had a cardiac cath on 09/28/15 and was found to have 40% LAD stenosis.   He is status post cardiac catheterization in 2021 with PCI to the LAD.  On  03/27/2021 he talked to Dr. Gasper Sells, via telemedicine visit. He had chest pain and a nuclear medicine stress test was ordered.  This was negative for ischemia in August 2022.   ? ?Since I last saw him he has done okay.  He has had no new cardiovascular complaints.  He does ride an exercise bike at the gym occasionally.  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  He tries to stay active.  He is limited by some joint pains which he thinks is related to his Crestor although he is on multiple surgeries.  He has not had any of the chest pain that he had recently and has not had to take any nitroglycerin. ? ?Past Medical History:  ?Diagnosis Date  ? Arthritis   ? "joints; from where I've had surgeries" (08/17/2013)  ? Chest pain   ? a. 08/2013 Card[facility]  CTA: Ca score 238 (95%), LM nl, LAD mod stenosis in D2 area, D1 mild ost stenosis, D2 no signif dzs, LCX small, RCA nl.  ? Coronary artery disease   ? LHC (08/18/13):  pLAD 50, CFX and RCA normal.  EF 55-65%.  LAD lesion FFR:  0.88 (normal).  Med rx recommended.    ? Dysphagia   ? Upper endoscopy 11/2013 - without obvious stricture s/p Maloney dilation.   ? GERD (gastroesophageal reflux disease)   ? Hyperlipidemia   ? a. Dx 3y ago - not on statin.  ? Hypertension   ? a. Dx 3y ago.  ? Hypothyroidism   ? a. on replacement.  ? IBS (irritable bowel syndrome)   ? colonoscopy 2000  ? Obesity   ? Sleep apnea   ? does not use CPAP  ? Snoring   ? Type II diabetes mellitus ([place])   ? ? ?Past Surgical History:  ?Procedure Laterality Date  ? ANKLE SURGERY Right   ? "had a growth in it; cut it out" (08/17/2013)  ? CARDIAC CATHETERIZATION N/A 09/28/2015  ? Procedure: Left Heart Cath and Coronary Angiography;  Surgeon: Peter M Martinique, MD;  Location: [place];  Service: Cardiovascular;  Laterality: N/A;  ? CHOLECYSTECTOMY    ?  CORONARY STENT INTERVENTION N/A 10/04/2019  ? Procedure: CORONARY STENT INTERVENTION;  Surgeon: Jettie Booze, MD;  Location: [facility];  Service: Cardiovascular;  Laterality: N/A;  ? INTRAVASCULAR PRESSURE WIRE/FFR STUDY N/A 10/04/2019  ? Procedure: INTRAVASCULAR PRESSURE WIRE/FFR STUDY;  Surgeon: Jettie Booze, MD;  Location: [place] [facility];  Service: Cardiovascular;  Laterality: N/A;  ? KNEE ARTHROSCOPY Right X 2  ? LEFT HEART CATH AND CORONARY ANGIOGRAPHY N/A 10/04/2019  ? Procedure: LEFT HEART CATH AND CORONARY ANGIOGRAPHY;  Surgeon: Jettie Booze, MD;  Location: [facility];  Service: Cardiovascular;  Laterality: N/A;  ? LEFT HEART CATHETERIZATION WITH CORONARY ANGIOGRAM N/A 08/18/2013  ? Procedure: LEFT HEART CATHETERIZATION WITH CORONARY ANGIOGRAM;  Surgeon: Peter M Martinique, MD;  Location[facility];  Service: Cardiovascular;  Laterality: N/A;  ? SHOULDER  ARTHROSCOPY W/ ROTATOR CUFF REPAIR Left   ? ? ? ?Current Outpatient Medications  ?Medication Sig Dispense Refill  ? acetaminophen (TYLENOL) 500 MG tablet Take 1,000 mg by mouth every 6 (six) hours as needed for mild pain or moderate pain. Reported on 01/09/2016    ? albuterol (VENTOLIN HFA) 108 (90 Base) MCG/ACT inhaler Inhale 2 puffs into the lungs every 6 (six) hours as needed for wheezing or shortness of breath. 8 g 0  ? aspirin EC 81 MG EC tablet Take 1 tablet (81 mg total) by mouth daily.    ? cyclobenzaprine (FLEXERIL) 10 MG tablet Take 1 tablet (10 mg total) by mouth at bedtime as needed for muscle spasms. 14 tablet 0  ? glimepiride (AMARYL) 4 MG tablet TAKE 2 TABLETS BY MOUTH ONCE DAILY BEFORE BREAKFAST 180 tablet 0  ? glucose blood (ONE TOUCH TEST STRIPS) test strip Up to 4 times daily.  Onetouch Verio.  E11.65 100 each 12  ? insulin NPH Human (NOVOLIN N) 100 UNIT/ML injection Inject 0.04-0.15 mLs (4-15 Units total) into the skin 2 (two) times daily before a meal. (Patient taking differently: Inject 7 Units into the skin 2 (two) times daily before a meal.) 10 mL 11  ? isosorbide mononitrate (IMDUR) 60 MG 24 hr tablet Take 1 tablet (60 mg total) by mouth daily. 90 tablet 3  ? Lancets (ONETOUCH ULTRASOFT) lancets Up to 4 times daily.  Onetouch Verio.  DxE11.65 100 each 12  ? levothyroxine (SYNTHROID) 125 MCG tablet TAKE 1 TABLET BY MOUTH ONCE DAILY BEFORE BREAKFAST 90 tablet 0  ? losartan (COZAAR) 100 MG tablet Take 1 tablet (100 mg total) by mouth daily. 90 tablet 3  ? metFORMIN (GLUCOPHAGE) 1000 MG tablet TAKE 1 TABLET BY MOUTH TWICE DAILY WITH A MEAL 180 tablet 0  ? Multiple Vitamins-Minerals (MULTIVITAMIN,TX-MINERALS) tablet Take 1 tablet by mouth daily.    ? nitroGLYCERIN (NITROSTAT) 0.4 MG SL tablet Place 1 tablet (0.4 mg total) under the tongue every 5 (five) minutes as needed for chest pain (CP or SOB). 25 tablet 3  ? Omega-3 Fatty Acids (FISH OIL) 1000 MG CAPS Take 1,000 mg by mouth every other day.     ? omeprazole (PRILOSEC) 20 MG capsule Take 1 capsule (20 mg total) by mouth daily. 30 capsule 3  ? oxybutynin (DITROPAN-XL) 5 MG 24 hr tablet Take 1 tablet (5 mg total) by mouth at bedtime. 90 tablet 1  ? rosuvastatin (CRESTOR) 5 MG tablet Take 1 tablet (5 mg total) by mouth daily. 30 tablet 11  ? silodosin (RAPAFLO) 8 MG CAPS capsule Take 8 mg by mouth daily.    ? ?  No current facility-administered medications for this visit.  ? ? ?Allergies:   Patient has no known allergies.  ? ? ?ROS:  Please see the history of present illness.   Otherwise, review of systems are positive for none.   All other systems are reviewed and negative.  ? ? ?PHYSICAL EXAM: ?VS:  BP 118/62   Pulse 87   Ht '5\' 9"'$  (1.753 m)   Wt (!) 318 lb 3.2 oz (144.3 kg)   SpO2 96%   BMI 46.99 kg/m?  , BMI Body mass index is 46.99 kg/m?. ?GENERAL:  Well appearing ?NECK:  No jugular venous distention, waveform within normal limits, carotid upstroke brisk and symmetric, no bruits, no thyromegaly ?LUNGS:  Clear to auscultation bilaterally ?CHEST:  Unremarkable ?HEART:  PMI not displaced or sustained,S1 and S2 within normal limits, no S3, no S4, no clicks, no rubs, no murmurs ?ABD:  Flat, positive bowel sounds normal in frequency in pitch, no bruits, no rebound, no guarding, no midline pulsatile mass, no hepatomegaly, no splenomegaly ?EXT:  2 plus pulses throughout, no edema, no cyanosis no clubbing ? ?EKG:  EKG is not ordered today. ? ? ?Recent Labs: ?09/18/2021: BUN 16; Creatinine, Ser 0.98; Potassium 3.9; Sodium 135 ?10/04/2021: ALT 56  ? ? ?Lipid Panel ?   ?Component Value Date/Time  ? CHOL 131 10/01/2021 0834  ? TRIG 158 (H) 10/01/2021 0834  ? HDL 35 (L) 10/01/2021 0834  ? CHOLHDL 3.7 10/01/2021 0834  ? CHOLHDL 4 02/14/2020 1035  ? VLDL 24.0 02/14/2020 1035  ? [place] 69 10/01/2021 0834  ? LDLDIRECT 68.0 03/11/2019 1427  ? ?  ? ?Wt Readings from Last 3 Encounters:  ?12/31/21 (!) 318 lb 3.2 oz (144.3 kg)  ?10/01/21 (!) 321 lb 3.2 oz (145.7 kg)   ?09/18/21 (!) 316 lb (143.3 kg)  ?  ? ? ?Other studies Reviewed: ?Additional studies/ records that were reviewed today include: Labs ?Review of the above records demonstrates:  Please see elsewhere in the note.   ? ?

## 2021-12-31 ENCOUNTER — Encounter: Payer: Self-pay | Admitting: Family Medicine

## 2021-12-31 ENCOUNTER — Telehealth: Payer: Self-pay | Admitting: Licensed Clinical Social Worker

## 2021-12-31 ENCOUNTER — Ambulatory Visit (INDEPENDENT_AMBULATORY_CARE_PROVIDER_SITE_OTHER): Payer: Self-pay | Admitting: Family Medicine

## 2021-12-31 ENCOUNTER — Encounter: Payer: Self-pay | Admitting: Cardiology

## 2021-12-31 ENCOUNTER — Ambulatory Visit (INDEPENDENT_AMBULATORY_CARE_PROVIDER_SITE_OTHER): Payer: Self-pay | Admitting: Cardiology

## 2021-12-31 VITALS — BP 118/62 | HR 87 | Ht 69.0 in | Wt 318.2 lb

## 2021-12-31 VITALS — BP 110/54 | HR 80 | Temp 97.3°F | Ht 69.0 in | Wt 319.2 lb

## 2021-12-31 DIAGNOSIS — E118 Type 2 diabetes mellitus with unspecified complications: Secondary | ICD-10-CM

## 2021-12-31 DIAGNOSIS — E119 Type 2 diabetes mellitus without complications: Secondary | ICD-10-CM

## 2021-12-31 DIAGNOSIS — E785 Hyperlipidemia, unspecified: Secondary | ICD-10-CM

## 2021-12-31 DIAGNOSIS — I10 Essential (primary) hypertension: Secondary | ICD-10-CM

## 2021-12-31 DIAGNOSIS — E1169 Type 2 diabetes mellitus with other specified complication: Secondary | ICD-10-CM

## 2021-12-31 DIAGNOSIS — I2510 Atherosclerotic heart disease of native coronary artery without angina pectoris: Secondary | ICD-10-CM

## 2021-12-31 DIAGNOSIS — E039 Hypothyroidism, unspecified: Secondary | ICD-10-CM

## 2021-12-31 MED ORDER — INSULIN NPH (HUMAN) (ISOPHANE) 100 UNIT/ML ~~LOC~~ SUSP: 5.0000 [IU] | Freq: Two times a day (BID) | SUBCUTANEOUS | 11 refills | Status: DC

## 2021-12-31 NOTE — Telephone Encounter (Signed)
Attempted to reach pt vis telephone post appointment, as no listed insurance. Called pt at [phone], no answer and voicemail currently full, unable to leave message. Will re-attempt again as able.  ? ?Westley Hummer, MSW, LCSW ?Clinical Social Worker II ?[facility]  ?[phone]- work cell phone (preferred) ?[phone]- desk phone ? ?

## 2021-12-31 NOTE — Patient Instructions (Signed)
Medication Instructions:  ?Hold Rosuvastatin for 1 month to see if symptoms improve. Please call with an update. ? ?*If you need a refill on your cardiac medications before your next appointment, please call your pharmacy* ? ? ?Lab Work: ?None ordered today ? ? ?Testing/Procedures: ?None ordered today ? ? ?Follow-Up: ?At [facility], you and your health needs are our priority.  As part of our continuing mission to provide you with exceptional heart care, we have created designated Provider Care Teams.  These Care Teams include your primary Cardiologist (physician) and Advanced Practice Providers (APPs -  Physician Assistants and Nurse Practitioners) who all work together to provide you with the care you need, when you need it. ? ?We recommend signing up for the patient portal called "MyChart".  Sign up information is provided on this After Visit Summary.  MyChart is used to connect with patients for Virtual Visits (Telemedicine).  Patients are able to view lab/test results, encounter notes, upcoming appointments, etc.  Non-urgent messages can be sent to your provider as well.   ?To learn more about what you can do with MyChart, go to [email or web address].   ? ?Your next appointment:   ?First available ? ?The format for your next appointment:   ?In Person ? ?Provider:   ?Dr. Heron Nay for sleep, then Dr. Percival Spanish in 1 year ? ? ?Important Information About Sugar ? ? ? ? ? ? ?

## 2021-12-31 NOTE — Patient Instructions (Addendum)
Continue Insulin 7 units before breakfast, start 5 units before dinner. Update me in 1 week with your morning sugars through mychart ? ?Team please give him exercises for rotator cuff ?I want you to do the exercise 3x a week for a month then once a week for another month. Stop any exercise that causes more than 1-2/10 pain increase. If not doing better within 1-2 months let us refer you to sports medicine or [facility] since you have seen them before ? ?Please stop by lab before you go ?If you have mychart- we will send your results within 3 business days of [place] receiving them.  ?If you do not have mychart- we will call you about results within 5 business days of [place] receiving them.  ?*please also note that you will see labs on mychart as soon as they post. I will later go in and write notes on them- will say "notes from Dr. Yong Channel"  ? ? ?Recommended follow up: Return in about 14 weeks (around 04/08/2022) for followup or sooner if needed.Schedule b4 you leave. ? ? ?

## 2022-01-01 ENCOUNTER — Telehealth: Payer: Self-pay | Admitting: Licensed Clinical Social Worker

## 2022-01-01 LAB — CBC WITH DIFFERENTIAL/PLATELET
Basophils Absolute: 0.1 10*3/uL (ref 0.0–0.1)
Basophils Relative: 0.9 % (ref 0.0–3.0)
Eosinophils Absolute: 0.3 10*3/uL (ref 0.0–0.7)
Eosinophils Relative: 4.5 % (ref 0.0–5.0)
HCT: 41.6 % (ref 39.0–52.0)
Hemoglobin: 13.9 g/dL (ref 13.0–17.0)
Lymphocytes Relative: 39.7 % (ref 12.0–46.0)
Lymphs Abs: 2.3 10*3/uL (ref 0.7–4.0)
MCHC: 33.4 g/dL (ref 30.0–36.0)
MCV: 86.7 fl (ref 78.0–100.0)
Monocytes Absolute: 0.4 10*3/uL (ref 0.1–1.0)
Monocytes Relative: 6.7 % (ref 3.0–12.0)
Neutro Abs: 2.8 10*3/uL (ref 1.4–7.7)
Neutrophils Relative %: 48.2 % (ref 43.0–77.0)
Platelets: 211 10*3/uL (ref 150.0–400.0)
RBC: 4.79 Mil/uL (ref 4.22–5.81)
RDW: 14.1 % (ref 11.5–15.5)
WBC: 5.8 10*3/uL (ref 4.0–10.5)

## 2022-01-01 LAB — COMPREHENSIVE METABOLIC PANEL
ALT: 56 U/L — ABNORMAL HIGH (ref 0–53)
AST: 37 U/L (ref 0–37)
Albumin: 4.3 g/dL (ref 3.5–5.2)
Alkaline Phosphatase: 100 U/L (ref 39–117)
BUN: 16 mg/dL (ref 6–23)
CO2: 25 mEq/L (ref 19–32)
Calcium: 8.9 mg/dL (ref 8.4–10.5)
Chloride: 98 mEq/L (ref 96–112)
Creatinine, Ser: 1.06 mg/dL (ref 0.40–1.50)
GFR: 79.46 mL/min (ref >60.00)
Glucose, Bld: 330 mg/dL — ABNORMAL HIGH (ref 70–99)
Potassium: 4.6 mEq/L (ref 3.5–5.1)
Sodium: 133 mEq/L — ABNORMAL LOW (ref 135–145)
Total Bilirubin: 0.7 mg/dL (ref 0.2–1.2)
Total Protein: 6.6 g/dL (ref 6.0–8.3)

## 2022-01-01 LAB — TSH: TSH: 3.42 u[IU]/mL (ref 0.35–5.50)

## 2022-01-01 LAB — HEMOGLOBIN A1C: Hgb A1c MFr Bld: 9.7 % — ABNORMAL HIGH (ref 4.6–6.5)

## 2022-01-01 NOTE — Progress Notes (Signed)
?Heart and Vascular Care Navigation ? ?01/01/2022 ? ?Gabriel [name] ?1966-01-18 ?[id number] ? ?Reason for Referral:  ?Uninsured, no pharmacy benefits.  ?Engaged with patient by telephone for initial visit for Heart and Vascular Care Coordination. ?                                                                                                  ?Assessment:   ?LCSW was able to reach pt finally via telephone, he returned my call. Introduced self, role, reason for call. Pt shares that he lives with his father, confirmed home address, PCP, and no current insurance. Has not worked or had insurance in at least 3 or more years. Pt and pt father live off of his income. There is no concern for costs of living outside of medical care. LCSW shared pt may be eligible for [facility] Financial Assistance and [employer]ist. Pt agreeable to these applications being mailed to him. He was encouraged to reach out to me with any questions.                                  ? ?HRT/VAS Care Coordination   ? ? Patients Home Cardiology Office [facility]  ? Outpatient Care Team Social Worker  ? Social Worker Name: Margarito [facility] [phone]  ? Living arrangements for the past 2 months Single Family Home  ? Lives with: Parents  ? Patient Current Insurance Coverage Self-Pay  ? Patient Has Concern With Paying Medical Bills Yes  ? Patient Concerns With Medical Bills no insurance, ongoing medical work up  ? Medical Bill Referrals: [facility]  ? Does Patient Have Prescription Coverage? No  ? Patient Prescription Assistance Programs [place] [facility]  ? [place] [facility] Medications mailed NCMedAssist  ? Home Assistive Devices/Equipment None  ? ?  ? ? ?Social History:                                                                             ?SDOH Screenings  ? ?Alcohol Screen: Not on file  ?Depression (PHQ2-9): Low Risk   ? PHQ-2 Score: 0  ?Financial Resource Strain: Low Risk   ? Difficulty of Paying Living Expenses: Not very hard   ?Food Insecurity: No Food Insecurity  ? Worried About Charity fundraiser in the Last Year: Never true  ? Ran Out of Food in the Last Year: Never true  ?Housing: Low Risk   ? Last Housing Risk Score: 0  ?Physical Activity: Not on file  ?Social Connections: Not on file  ?Stress: Not on file  ?Tobacco Use: Medium Risk  ? Smoking Tobacco Use: Former  ? Smokeless Tobacco Use: Never  ? Passive Exposure: Not on file  ?Transportation Needs: No Transportation Needs  ?  Lack of Transportation (Medical): No  ? Lack of Transportation (Non-Medical): No  ? ? ?SDOH Interventions: ?Financial Resources:  Financial Strain Interventions: Other (Comment) (mailed [employer]  and [employer]ist; pt father covers most bills) ?Financial Counseling for [employer] [facility]  ?Food Insecurity:  Food Insecurity Interventions: Intervention Not Indicated  ?Housing Insecurity:  Housing Interventions: Intervention Not Indicated  ?Transportation:   Transportation Interventions: Intervention Not Indicated  ? ? ?Other Care Navigation Interventions:    ? ?Provided Pharmacy assistance resources [place] [facility]  ? ?Follow-up plan:   ?Mailed the following: N[employer]edAssist application, [facility] Financial Assistance and my card. I will f/u with pt, pt encouraged to f/u with me when assistance applications received and if any questions/concerns arise before that time. ? ? ? ?

## 2022-01-14 ENCOUNTER — Telehealth: Payer: Self-pay | Admitting: Licensed Clinical Social Worker

## 2022-01-14 NOTE — Telephone Encounter (Signed)
Attempted to reach pt this morning, no answer at [phone]. Message left, pt sent me a text requesting that I send him a text. I sent him a text to f/u on assistance program applications. No answer at this time. Will reattempt as able.  ? ?Westley Hummer, MSW, LCSW ?Clinical Social Worker II ?[place] [facility]/Vascular Care Navigation  ?[phone]- work cell phone (preferred) ?[phone]- desk phone ? ?

## 2022-01-17 ENCOUNTER — Telehealth: Payer: Self-pay | Admitting: Licensed Clinical Social Worker

## 2022-01-17 NOTE — Telephone Encounter (Signed)
LCSW was able to reach pt this afternoon regarding his assistance applications, he has received them but hasn't yet started working on them. LCSW encouraged pt to reach out to me if he would like to meet and complete applications or receive any assistance telephonically with completing them. No additional questions/concerns at this time. Pt will look at his schedule for next week (his father has several appointments) and let me know if any assistance desired.  ? ?Gabriel [name], MSW, LCSW ?Clinical Social Worker II ?[place] [facility]/V[facility] Care Navigation  ?[phone]- work cell phone (preferred) ?[phone]- desk phone ? ?

## 2022-01-18 ENCOUNTER — Telehealth: Payer: Self-pay | Admitting: Cardiology

## 2022-01-18 NOTE — Telephone Encounter (Signed)
Called pt on 04/26, 05/10 and 05/12 to schedule appt with Dr. Radford Pax for sleep, LVM all 3 times with no call back.  ?

## 2022-02-15 ENCOUNTER — Telehealth: Payer: Self-pay | Admitting: Licensed Clinical Social Worker

## 2022-02-15 NOTE — Telephone Encounter (Signed)
Contacted pt regarding assistance applications.  Pt still hasn't started working on them, states he is busy but will contact me if needed.  I will not actively follow, but remain available as needed.   Westley Hummer, MSW, [place]  [phone]- work cell phone (preferred) [phone]- desk phone

## 2022-03-28 ENCOUNTER — Other Ambulatory Visit: Payer: Self-pay | Admitting: Family Medicine

## 2022-03-28 ENCOUNTER — Other Ambulatory Visit: Payer: Self-pay | Admitting: Internal Medicine

## 2022-03-29 NOTE — Telephone Encounter (Signed)
This is Dr. Hochrein's pt. °

## 2022-04-08 ENCOUNTER — Ambulatory Visit (INDEPENDENT_AMBULATORY_CARE_PROVIDER_SITE_OTHER): Payer: Self-pay | Admitting: Family Medicine

## 2022-04-08 ENCOUNTER — Encounter: Payer: Self-pay | Admitting: Family Medicine

## 2022-04-08 VITALS — BP 102/64 | HR 81 | Temp 98.0°F | Ht 69.0 in | Wt 315.2 lb

## 2022-04-08 DIAGNOSIS — E1169 Type 2 diabetes mellitus with other specified complication: Secondary | ICD-10-CM

## 2022-04-08 DIAGNOSIS — I10 Essential (primary) hypertension: Secondary | ICD-10-CM

## 2022-04-08 DIAGNOSIS — E039 Hypothyroidism, unspecified: Secondary | ICD-10-CM

## 2022-04-08 DIAGNOSIS — I25118 Atherosclerotic heart disease of native coronary artery with other forms of angina pectoris: Secondary | ICD-10-CM

## 2022-04-08 DIAGNOSIS — E119 Type 2 diabetes mellitus without complications: Secondary | ICD-10-CM

## 2022-04-08 DIAGNOSIS — E785 Hyperlipidemia, unspecified: Secondary | ICD-10-CM

## 2022-04-08 NOTE — Patient Instructions (Addendum)
  update a1c but suspect remains high- increase nph to 9 units in AM and 9 units in PM for 1 week- if no sugars in morning under 140 go to 10 units in AM and 10 units in PM and update me in another week with fasting sugars and any other #s you have.   Please stop by lab before you go If you have mychart- we will send your results within 3 business days of [place] receiving them.  If you do not have mychart- we will call you about results within 5 business days of [place] receiving them.  *please also note that you will see labs on mychart as soon as they post. I will later go in and write notes on them- will say "notes from Dr. Yong Channel"   Recommended follow up: Return in about 4 months (around 08/08/2022) for followup or sooner if needed.Schedule b4 you leave.

## 2022-04-08 NOTE — Progress Notes (Signed)
Phone [phone] In person visit   Subjective:   Gabriel [name] is a 56 y.o. year old very pleasant male patient who presents for/with See problem oriented charting Chief Complaint  Patient presents with   Follow-up   Hypertension   Diabetes    Pt states his sugars are still out of control, his sugars last night was 252 an hour after eating, this morning fasting was 180.   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    Priority: Medium    Morbid obesity ([place]) 08/10/2013    Priority: Medium    Essential hypertension 02/06/2011    Priority: Medium    Hypothyroidism 02/06/2011    Priority: Medium    Hyperlipidemia associated with type 2 diabetes mellitus ([place]) 02/06/2011    Priority: Medium    Colon cancer screening 10/27/2018    Priority: Low   Midepigastric pain 09/27/2018    Priority: Low   Change in bowel habits 09/27/2018    Priority: Low   Family hx colonic polyps 09/27/2018    Priority: Low   BRBPR (bright red blood per rectum) 09/27/2018    Priority: Low   Left lateral epicondylitis 03/18/2016    Priority: Low   GERD (gastroesophageal reflux disease) 02/01/2015    Priority: Low   Dysphagia 09/30/2013    Priority: Low   Dyslipidemia 09/30/2021   Myalgia 03/27/2021   Chest pain of uncertain etiology 70/96/2836    Medications- reviewed and updated Current Outpatient Medications  Medication Sig Dispense Refill   acetaminophen (TYLENOL) 500 MG tablet Take 1,000 mg by mouth every 6 (six) hours as needed for mild pain or moderate pain. Reported on 01/09/2016     albuterol (VENTOLIN HFA) 108 (90 Base) MCG/ACT inhaler Inhale 2 puffs into the lungs every 6 (six) hours as needed for wheezing or shortness of breath. 8 g 0   aspirin EC 81 MG EC tablet Take 1 tablet  (81 mg total) by mouth daily.     cyclobenzaprine (FLEXERIL) 10 MG tablet Take 1 tablet (10 mg total) by mouth at bedtime as needed for muscle spasms. 14 tablet 0   glimepiride (AMARYL) 4 MG tablet TAKE 2 TABLETS BY MOUTH ONCE DAILY BEFORE BREAKFAST 180 tablet 0   glucose blood (ONE TOUCH TEST STRIPS) test strip Up to 4 times daily.  Onetouch Verio.  E11.65 100 each 12   insulin NPH Human (NOVOLIN N) 100 UNIT/ML injection Inject 0.05-0.15 mLs (5-15 Units total) into the skin 2 (two) times daily before a meal. 10 mL 11   Lancets (ONETOUCH ULTRASOFT) lancets Up to 4 times daily.  Onetouch Verio.  DxE11.65 100 each 12   levothyroxine (SYNTHROID) 125 MCG tablet TAKE 1 TABLET BY MOUTH ONCE DAILY BEFORE BREAKFAST 90 tablet 0   losartan (COZAAR) 100 MG tablet Take 1 tablet by mouth once daily 90 tablet 3   metFORMIN (GLUCOPHAGE) 1000 MG tablet TAKE 1 TABLET BY MOUTH TWICE DAILY WITH A MEAL 180 tablet 0   Multiple Vitamins-Minerals (MULTIVITAMIN,TX-MINERALS) tablet Take 1 tablet by mouth daily.     nitroGLYCERIN (NITROSTAT) 0.4 MG SL tablet Place 1 tablet (0.4 mg total) under the tongue every 5 (five) minutes as needed for chest pain (CP or SOB). 25 tablet 3   Omega-3 Fatty Acids (FISH OIL)  1000 MG CAPS Take 1,000 mg by mouth every other day.     omeprazole (PRILOSEC) 20 MG capsule Take 1 capsule (20 mg total) by mouth daily. 30 capsule 3   silodosin (RAPAFLO) 8 MG CAPS capsule Take 8 mg by mouth daily.     isosorbide mononitrate (IMDUR) 60 MG 24 hr tablet Take 1 tablet (60 mg total) by mouth daily. 90 tablet 3   No current facility-administered medications for this visit.     Objective:  BP 102/64   Pulse 81   Temp 98 F (36.7 C)   Ht '5\' 9"'$  (1.753 m)   Wt (!) 315 lb 3.2 oz (143 kg)   SpO2 95%   BMI 46.55 kg/m  Gen: NAD, resting comfortably CV: RRR no murmurs rubs or gallops Lungs: CTAB no crackles, wheeze, rhonchi Ext: trace to 1+ morning stiffness and severe edema Skin: warm,  dry  Diabetic Foot Exam - Simple   Simple Foot Form Diabetic Foot exam was performed with the following findings: Yes 04/08/2022 11:13 AM  Visual Inspection No deformities, no ulcerations, no other skin breakdown bilaterally: Yes Sensation Testing Intact to touch and monofilament testing bilaterally: Yes Pulse Check Posterior Tibialis and Dorsalis pulse intact bilaterally: Yes Comments       Assessment and Plan   # Diabetes S: Medication:Metformin 1000 mg twice daily, insulin NPH 8 units in the morning and in the evening before meals, glimepiride 8 mg -Cardiology continues to prefer for him to be on Ozempic for cardioprotective effect but patient has been unable to afford this CBGs-last night blood sugar was 252 an hour after eating, this morning fasting was 180.morning 180s to 200s.  Exercise and diet- down 4 lbs from last visit. Trying to remain active- mowing grass- hard to get to gym with work demands and caring for his dad. Tries to minimize soda- some diet soda -encouraged water Lab Results  Component Value Date   HGBA1C 9.7 (H) 12/31/2021   HGBA1C 8.6 (A) 09/18/2021   HGBA1C 9.0 (H) 05/30/2020   A/P: update a1c but suspect remains high- increase nph to 9 units in AM and 9 units in PM for 1 week- if no sugars in morning under 140 go to 10 units in AM and 10 units in PM and update me in another week with fasting sugars and any other #s you have.   #hypertension S: medication: losartan '100mg'$   BP Readings from Last 3 Encounters:  04/08/22 102/64  12/31/21 (!) 110/54  12/31/21 118/62  A/P: Controlled. Continue current medications.   #CAD - saw cardiology in April Dr. Percival Spanish #hyperlipidemia S: Medication:Omega-3, Imdur 60 mg, aspirin 81 mg -had been on rosuvastatin '5mg'$  and came off but muscle aches didn't improve so started back - no chest pain or shortness of breath  Lab Results  Component Value Date   CHOL 131 10/01/2021   HDL 35 (L) 10/01/2021   LDLCALC 69  10/01/2021   LDLDIRECT 68.0 03/11/2019   TRIG 158 (H) 10/01/2021   CHOLHDL 3.7 10/01/2021   A/P: CAD asymptomatic-continue current medications and follow-up with Dr. Percival Spanish.  Lipids likely well controlled back on rosuvastatin 5 mg-continue current medication  #hypothyroidism S: compliant On thyroid medication-Euthyrox 125 mcg Lab Results  Component Value Date   TSH 3.42 12/31/2021   A/P:  Controlled. Continue current medications.     #Urinary frequency-patient had been started on oxybutynin through Inda Coke, PA at our office and referred to urology-urology changed him to Rapaflo but he was  concerned at last visit that it was a sugar related issue- still thinks so - sugar under 200 seems to help him   Recommended follow up: Return in about 4 months (around 08/08/2022) for followup or sooner if needed.Schedule b4 you leave.  Lab/Order associations:   ICD-10-CM   1. Atherosclerosis of native coronary artery of native heart with stable angina pectoris ([place])  I25.118     2. Diabetes mellitus without complication ([place])  N27.6     3. Hypothyroidism, unspecified type  E03.9     4. Hyperlipidemia associated with type 2 diabetes mellitus ([place])  E11.69    E78.5     5. Essential hypertension  I10      No orders of the defined types were placed in this encounter.   Return precautions advised.  Garret Reddish, MD

## 2022-04-09 ENCOUNTER — Other Ambulatory Visit: Payer: Self-pay

## 2022-04-09 LAB — COMPREHENSIVE METABOLIC PANEL
ALT: 49 U/L (ref 0–53)
AST: 33 U/L (ref 0–37)
Albumin: 4.3 g/dL (ref 3.5–5.2)
Alkaline Phosphatase: 101 U/L (ref 39–117)
BUN: 16 mg/dL (ref 6–23)
CO2: 25 mEq/L (ref 19–32)
Calcium: 9 mg/dL (ref 8.4–10.5)
Chloride: 103 mEq/L (ref 96–112)
Creatinine, Ser: 0.97 mg/dL (ref 0.40–1.50)
GFR: 88.22 mL/min (ref >60.00)
Glucose, Bld: 259 mg/dL — ABNORMAL HIGH (ref 70–99)
Potassium: 4.2 mEq/L (ref 3.5–5.1)
Sodium: 139 mEq/L (ref 135–145)
Total Bilirubin: 0.8 mg/dL (ref 0.2–1.2)
Total Protein: 6.8 g/dL (ref 6.0–8.3)

## 2022-04-09 LAB — HEMOGLOBIN A1C: Hgb A1c MFr Bld: 9.9 % — ABNORMAL HIGH (ref 4.6–6.5)

## 2022-05-01 ENCOUNTER — Encounter: Payer: Self-pay | Admitting: Family Medicine

## 2022-06-28 ENCOUNTER — Other Ambulatory Visit: Payer: Self-pay | Admitting: Family Medicine

## 2022-07-09 ENCOUNTER — Other Ambulatory Visit: Payer: Self-pay | Admitting: Family Medicine

## 2022-07-17 ENCOUNTER — Other Ambulatory Visit: Payer: Self-pay

## 2022-07-17 MED ORDER — GLIMEPIRIDE 4 MG PO TABS: ORAL_TABLET | ORAL | 3 refills | Status: DC

## 2022-07-30 ENCOUNTER — Other Ambulatory Visit: Payer: Self-pay | Admitting: Internal Medicine

## 2022-08-08 ENCOUNTER — Ambulatory Visit: Payer: Self-pay | Admitting: Family Medicine

## 2022-08-13 ENCOUNTER — Ambulatory Visit (INDEPENDENT_AMBULATORY_CARE_PROVIDER_SITE_OTHER): Payer: Self-pay | Admitting: Family Medicine

## 2022-08-13 ENCOUNTER — Encounter: Payer: Self-pay | Admitting: Family Medicine

## 2022-08-13 VITALS — BP 124/72 | HR 75 | Temp 98.5°F | Ht 69.0 in | Wt 318.2 lb

## 2022-08-13 DIAGNOSIS — E119 Type 2 diabetes mellitus without complications: Secondary | ICD-10-CM

## 2022-08-13 LAB — POCT GLYCOSYLATED HEMOGLOBIN (HGB A1C): Hemoglobin A1C: 8.6 % — AB (ref 4.0–5.6)

## 2022-08-13 MED ORDER — ROSUVASTATIN CALCIUM 5 MG PO TABS: 5.0000 mg | ORAL_TABLET | Freq: Every day | ORAL | 3 refills | Status: DC

## 2022-08-13 NOTE — Progress Notes (Signed)
Phone [phone] In person visit   Subjective:   Gabriel [name] is a 56 y.o. 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    Priority: Medium    Morbid obesity ([place]) 08/10/2013    Priority: Medium    Essential hypertension 02/06/2011    Priority: Medium    Hypothyroidism 02/06/2011    Priority: Medium    Hyperlipidemia associated with type 2 diabetes mellitus ([place]) 02/06/2011    Priority: Medium    Colon cancer screening 10/27/2018    Priority: Low   Midepigastric pain 09/27/2018    Priority: Low   Change in bowel habits 09/27/2018    Priority: Low   Family hx colonic polyps 09/27/2018    Priority: Low   BRBPR (bright red blood per rectum) 09/27/2018    Priority: Low   Left lateral epicondylitis 03/18/2016    Priority: Low   GERD (gastroesophageal reflux disease) 02/01/2015    Priority: Low   Dysphagia 09/30/2013    Priority: Low   Dyslipidemia 09/30/2021   Myalgia 03/27/2021   Chest pain of uncertain etiology 19/41/7408    Medications- reviewed and updated Current Outpatient Medications  Medication Sig Dispense Refill   acetaminophen (TYLENOL) 500 MG tablet Take 1,000 mg by mouth every 6 (six) hours as needed for mild pain or moderate pain. Reported on 01/09/2016     albuterol (VENTOLIN HFA) 108 (90 Base) MCG/ACT inhaler Inhale 2 puffs into the lungs every 6 (six) hours as needed for wheezing or shortness of breath. 8 g 0   aspirin EC 81 MG EC tablet Take 1 tablet (81 mg total) by mouth daily.     cyclobenzaprine (FLEXERIL) 10 MG tablet  Take 1 tablet (10 mg total) by mouth at bedtime as needed for muscle spasms. 14 tablet 0   glimepiride (AMARYL) 4 MG tablet TAKE 2 TABLETS BY MOUTH ONCE DAILY BEFORE BREAKFAST 180 tablet 3   glucose blood (ONE TOUCH TEST STRIPS) test strip Up to 4 times daily.  Onetouch Verio.  E11.65 100 each 12   insulin NPH Human (NOVOLIN N) 100 UNIT/ML injection Inject 0.05-0.15 mLs (5-15 Units total) into the skin 2 (two) times daily before a meal. 10 mL 11   Lancets (ONETOUCH ULTRASOFT) lancets Up to 4 times daily.  Onetouch Verio.  DxE11.65 100 each 12   levothyroxine (SYNTHROID) 125 MCG tablet TAKE 1 TABLET BY MOUTH ONCE DAILY BEFORE BREAKFAST 90 tablet 0   losartan (COZAAR) 100 MG tablet Take 1 tablet by mouth once daily 90 tablet 3   metFORMIN (GLUCOPHAGE) 1000 MG tablet TAKE 1 TABLET BY MOUTH TWICE DAILY WITH A MEAL 180 tablet 0   Multiple Vitamins-Minerals (MULTIVITAMIN,TX-MINERALS) tablet Take 1 tablet by mouth daily.     nitroGLYCERIN (NITROSTAT) 0.4 MG SL tablet Place 1 tablet (0.4 mg total) under the tongue every 5 (five) minutes as needed for chest pain (CP or SOB). 25 tablet 3   Omega-3 Fatty Acids (FISH OIL) 1000 MG CAPS Take 1,000 mg by mouth every other day.  omeprazole (PRILOSEC) 20 MG capsule Take 1 capsule (20 mg total) by mouth daily. 30 capsule 3   rosuvastatin (CRESTOR) 5 MG tablet Take 1 tablet (5 mg total) by mouth daily. 90 tablet 3   silodosin (RAPAFLO) 8 MG CAPS capsule Take 8 mg by mouth daily.     No current facility-administered medications for this visit.     Objective:  BP 124/72 (BP Location: Right Arm, Patient Position: Sitting)   Pulse 75   Temp 98.5 F (36.9 C) (Temporal)   Ht '5\' 9"'$  (1.753 m)   Wt (!) 318 lb 3.2 oz (144.3 kg)   SpO2 95%   BMI 46.99 kg/m  Gen: NAD, resting comfortably CV: RRR no murmurs rubs or gallops Lungs: CTAB no crackles, wheeze, rhonchi Ext: trace edema Skin: warm, dry     Assessment and Plan   # Diabetes S: Medication:Insulin  NPH 12 units in the morning and 10 units in the evening, metformin 1000 mg twice daily, glimepiride 8 mg in the morning CBGs- when he was ill recently sugars as low as 110 about 1.5 weeks ago then this (he went down to 5 and 5 with this), then this morning appears higher- up to 180- still down from 300. Lowest he has seen this year is 110.  Exercise and diet- weight up 3 lbs but just went through thanksgiving. Not exercising regularly. Trying to improve diet Lab Results  Component Value Date   HGBA1C 8.6 (A) 08/13/2022   HGBA1C 9.9 (H) 04/09/2022   HGBA1C 9.7 (H) 12/31/2021   A/P: a1c down to 8.6 POC but having variability - recommend he go back to 12 units in the morning and 10 units in the evening. If any sugars below 100 want him to let me know and I will likely adjust down. Continue metformin and glimepiride- options limited due to no insurance/cost concerns- jardiance would be a great choice if he can get insurance  From avs "Exercise prescription: Restart walking 20 minute 3 days a week IF it is raining on your planned day- go to the gym instead Do this consistently through the end of January- can start next week  In the long run want to get you to 150 minutes exercise per week but the main thing right now is moving the needle. Exercise is like medicine for diabetes so we really need this to lower your sugars"  # CAD  #hyperlipidemia S: Medication:Aspirin 81 mg, rosuvastatin 5 mg (muscle aches he thought at first but tried off and back on and no difference)- got declined by cardiology as fell off med list -he stopped taking imdur months ago and no chest pain reported, no shortness of breath  Lab Results  Component Value Date   CHOL 131 10/01/2021   HDL 35 (L) 10/01/2021   LDLCALC 69 10/01/2021   LDLDIRECT 68.0 03/11/2019   TRIG 158 (H) 10/01/2021   CHOLHDL 3.7 10/01/2021   A/P: CAD asymptomatic- continue current medications . Off imdur for now- if has future chest pain or BP higher  can restart  #hypertension S: medication: Losartan 100 mg BP Readings from Last 3 Encounters:  08/13/22 124/72  04/08/22 102/64  12/31/21 (!) 110/54  A/P: Controlled. Continue current medications.   #hypothyroidism S: compliant On thyroid medication-levothyroxine 125 mcg Lab Results  Component Value Date   TSH 3.42 12/31/2021   A/P:Controlled. Continue current medications.       #no insurance- wants to push labs to next visit for cost savings- I think  reasonable with LFts improved- likely full panel in 14 weeks  Recommended follow up: Return in about 14 weeks (around 11/19/2022) for followup or sooner if needed.Schedule b4 you leave.  Lab/Order associations:   ICD-10-CM   1. Diabetes mellitus without complication (HCC)  H38.8 POCT HgB A1C      Meds ordered this encounter  Medications   rosuvastatin (CRESTOR) 5 MG tablet    Sig: Take 1 tablet (5 mg total) by mouth daily.    Dispense:  90 tablet    Refill:  3    Return precautions advised.  Garret Reddish, MD

## 2022-08-13 NOTE — Patient Instructions (Addendum)
Declines Tdap- you need this if get cut or scrape  a1c down to 8.6 POC but having variability - recommend he go back to 12 units in the morning and 10 units in the evening. If any sugars below 100 (see handout on low sugars if they occur) want him to let me know and I will likely adjust down. Continue metformin and glimepiride- options limited due to no insurance/cost concerns- jardiance would be a great choice if he can get insurance  Exercise prescription: Restart walking 20 minute 3 days a week IF it is raining on your planned day- go to the gym instead Do this consistently through the end of January- can start next week  In the long run want to get you to 150 minutes exercise per week but the main thing right now is moving the needle. Exercise is like medicine for diabetes so we really need this to lower your sugars  Recommended follow up: Return in about 14 weeks (around 11/19/2022) for followup or sooner if needed.Schedule b4 you leave.

## 2022-10-04 ENCOUNTER — Other Ambulatory Visit: Payer: Self-pay | Admitting: Family Medicine

## 2022-10-22 ENCOUNTER — Other Ambulatory Visit: Payer: Self-pay | Admitting: Family Medicine

## 2022-10-29 ENCOUNTER — Telehealth: Payer: Self-pay | Admitting: Cardiology

## 2022-10-29 NOTE — Telephone Encounter (Signed)
Spoke to patient    He states he  has chest pain - comes and goes   lasting  about hour  . It occurs without activity . It dose not wake him  up at night.  Per patient no radiation of pain, diaphoresis , no nausea today , but Sunday he  had some nausea   Used NTG x1 last week with relief . Has not use any today.  Aware if symptoms become worse to call 911 and got ER. Appointment schedule for  2/23 at 3:35 pm

## 2022-10-29 NOTE — Telephone Encounter (Signed)
Pt c/o of Chest Pain: STAT if CP now or developed within 24 hours  1. Are you having CP right now? yes  2. Are you experiencing any other symptoms (ex. SOB, nausea, vomiting, sweating)? Nausea   3. How long have you been experiencing CP? Saturday  4. Is your CP continuous or coming and going? Coming and going frequently  5. Have you taken Nitroglycerin? Yes  ?

## 2022-10-31 ENCOUNTER — Ambulatory Visit: Payer: Self-pay | Attending: Physician Assistant | Admitting: Physician Assistant

## 2022-10-31 ENCOUNTER — Encounter: Payer: Self-pay | Admitting: Physician Assistant

## 2022-10-31 VITALS — BP 138/72 | HR 81 | Ht 69.0 in | Wt 317.6 lb

## 2022-10-31 DIAGNOSIS — R072 Precordial pain: Secondary | ICD-10-CM

## 2022-10-31 DIAGNOSIS — E785 Hyperlipidemia, unspecified: Secondary | ICD-10-CM

## 2022-10-31 DIAGNOSIS — I2510 Atherosclerotic heart disease of native coronary artery without angina pectoris: Secondary | ICD-10-CM

## 2022-10-31 DIAGNOSIS — E119 Type 2 diabetes mellitus without complications: Secondary | ICD-10-CM

## 2022-10-31 DIAGNOSIS — E1169 Type 2 diabetes mellitus with other specified complication: Secondary | ICD-10-CM

## 2022-10-31 DIAGNOSIS — I10 Essential (primary) hypertension: Secondary | ICD-10-CM

## 2022-10-31 NOTE — Patient Instructions (Addendum)
Medication Instructions:   Your physician recommends that you continue on your current medications as directed. Please refer to the Current Medication list given to you today.  *If you need a refill on your cardiac medications before your next appointment, please call your pharmacy*  Lab Work: Your physician recommends that you return for lab work at your earliest convenience :  Fasting Lipid Panel-DO NOT eat or drink past midnight. Okay to have water and/or black coffee only the morning of labs. Hepatic (Liver) Function test   If you have labs (blood work) drawn today and your tests are completely normal, you will receive your results only by: MyChart Message (if you have MyChart) OR A paper copy in the mail If you have any lab test that is abnormal or we need to change your treatment, we will call you to review the results.  Testing/Procedures: Your physician has requested that you have an echocardiogram. Echocardiography is a painless test that uses sound waves to create images of your heart. It provides your doctor with information about the size and shape of your heart and how well your heart's chambers and valves are working. This procedure takes approximately one hour. There are no restrictions for this procedure. Please do NOT wear cologne, perfume, aftershave, or lotions (deodorant is allowed). Please arrive 15 minutes prior to your appointment time.   CARDIAC PET- Your physician has requested that you have a Cardiac Pet Stress Test. This testing is completed at [facility] ([place], [place] [address]). The schedulers will call you to get this scheduled. Please follow instructions below and call the office with any questions/concerns [phone]).  Follow-Up: At [facility], you and your health needs are our priority.  As part of our continuing mission to provide you with exceptional heart care, we have created designated Provider  Care Teams.  These Care Teams include your primary Cardiologist (physician) and Advanced Practice Providers (APPs -  Physician Assistants and Nurse Practitioners) who all work together to provide you with the care you need, when you need it.  Your next appointment:   6 week(s)  Provider:   Minus Breeding, MD  or  APP         Other Instructions    How to Prepare for Your Cardiac PET/CT Stress Test:  1. Please do not take these medications before your test:   Medications that may interfere with the cardiac pharmacological stress agent (ex. nitrates - including erectile dysfunction medications, isosorbide mononitrate, tamulosin or beta-blockers) the day of the exam. (Erectile dysfunction medication should be held for at least 72 hrs prior to test) Theophylline containing medications for 12 hours. Dipyridamole 48 hours prior to the test. Your remaining medications may be taken with water.  2. Nothing to eat or drink, except water, 3 hours prior to arrival time.   NO caffeine/decaffeinated products, or chocolate 12 hours prior to arrival.  3. NO perfume, cologne or lotion  4. Total time is 1 to 2 hours; you may want to bring reading material for the waiting time.  5. Please report to Radiology at the [facility] Main Entrance 30 minutes early for your test.  [place], [place] [address]  Diabetic Preparation:  Hold oral medications. You may take NPH and Lantus insulin. Do not take Humalog or Humulin R (Regular Insulin) the day of your test. Check blood sugars prior to leaving the house. If able to eat breakfast prior to 3  hour fasting, you may take all medications, including your insulin, Do not worry if you miss your breakfast dose of insulin - start at your next meal.  IF YOU THINK YOU MAY BE PREGNANT, OR ARE NURSING PLEASE INFORM THE TECHNOLOGIST.  In preparation for your appointment, medication and supplies will be purchased.  Appointment availability  is limited, so if you need to cancel or reschedule, please call the Radiology Department at [phone]  24 hours in advance to avoid a cancellation fee of $100.00  What to Expect After you Arrive:  Once you arrive and check in for your appointment, you will be taken to a preparation room within the Radiology Department.  A technologist or Nurse will obtain your medical history, verify that you are correctly prepped for the exam, and explain the procedure.  Afterwards,  an IV will be started in your arm and electrodes will be placed on your skin for EKG monitoring during the stress portion of the exam. Then you will be escorted to the PET/CT scanner.  There, staff will get you positioned on the scanner and obtain a blood pressure and EKG.  During the exam, you will continue to be connected to the EKG and blood pressure machines.  A small, safe amount of a radioactive tracer will be injected in your IV to obtain a series of pictures of your heart along with an injection of a stress agent.    After your Exam:  It is recommended that you eat a meal and drink a caffeinated beverage to counter act any effects of the stress agent.  Drink plenty of fluids for the remainder of the day and urinate frequently for the first couple of hours after the exam.  Your doctor will inform you of your test results within 7-10 business days.  For questions about your test or how to prepare for your test, please call: Marchia Bond, Cardiac Imaging Nurse Navigator  Gordy Clement, Cardiac Imaging Nurse Navigator Office: [phone]

## 2022-10-31 NOTE — Progress Notes (Signed)
Cardiology Office Note:    Date:  11/02/2022   ID:  Gabriel [name], DOB 1966-05-16, MRN [id number]  PCP:  Marin Olp, MD   [place] Providers Cardiologist:  Minus Breeding, MD     Referring MD: Marin Olp, MD   Chief Complaint  Patient presents with   Chest Pain    Seen for Dr. Percival Spanish    History of Present Illness:    Gabriel [name] is a 57 y.o. male with a hx of CAD, hyperlipidemia, hypertension, hypothyroidism, obstructive sleep apnea not on CPAP, DM2.  Coronary CT performed in December 2014 demonstrated coronary calcium score of 238 which placed the patient in 95th percentile for age and sex matched control, moderate LAD lesion.  Subsequent cardiac catheterization performed on 08/18/2013 demonstrated 50% proximal LAD lesion, EF 55 to 65%, normal left circumflex and RCA.  FFR was normal at 0.88, medical therapy was recommended.  Patient was seen in the ED in 2017 with chest discomfort.  Symptom was atypical.  Cardiac marker was negative.  Myoview demonstrated EF 56%, medium sized moderate defect in the basal inferoseptal and mid anteroseptal and mid inferoseptal apical region.  He underwent cardiac catheterization again on 09/28/2015 that showed 40% LAD lesion.  He was readmitted to the hospital in January 2021 and underwent cardiac catheterization on 10/04/2019 which revealed a 60% mid LAD lesion however FFR was 0.78, this lesion was treated with Synergy XD 3.0 x 20 mm DES, EF 55 to 60%.  Patient was placed on aspirin and Brilinta afterward.  He was seen by Dr. Gasper Sells in 2022 who ordered a nuclear stress test.  Myoview obtained 04/11/2021 demonstrated EF 56%, fixed medium defect of mild severity present in the basal inferior, mid inferior and apical inferior location, overall low risk study.  Patient was last seen by Dr. Percival Spanish in April 2023 at which time he was still able to exercise on the bike at the gym occasionally.  He was complaining of joint  pains, therefore it was recommended he hold his Crestor for 4 weeks to see if his symptoms improved.  Patient presents today for evaluation of chest pain.  He had epigastric and lower sternal chest discomfort that started on Saturday however did not go away until Tuesday.  He felt good yesterday without any recurrent chest pain.  This morning he also had no recurrent chest discomfort.  He denies any exacerbating factors such as deep inspiration, body rotation on palpation.  The chest pain did radiate up to the left upper chest.  EKG today shows no acute changes.  I recommended echocardiogram and PET stress test.  He can follow-up in 6 weeks.  Past Medical History:  Diagnosis Date   Arthritis    "joints; from where I've had surgeries" (08/17/2013)   Chest pain    a. 08/2013 Cardia CTA: Ca score 238 (95%), LM nl, LAD mod stenosis in D2 area, D1 mild ost stenosis, D2 no signif dzs, LCX small, RCA nl.   Coronary artery disease    LHC (08/18/13):  pLAD 50, CFX and RCA normal.  EF 55-65%.  LAD lesion FFR:  0.88 (normal).  Med rx recommended.     Dysphagia    Upper endoscopy 11/2013 - without obvious stricture s/p Maloney dilation.    GERD (gastroesophageal reflux disease)    Hyperlipidemia    a. Dx 3y ago - not on statin.   Hypertension    a. Dx 3y ago.   Hypothyroidism  a. on replacement.   IBS (irritable bowel syndrome)    colonoscopy 2000   Obesity    Sleep apnea    does not use CPAP   Snoring    Type II diabetes mellitus ([place])     Past Surgical History:  Procedure Laterality Date   ANKLE SURGERY Right    "had a growth in it; cut it out" (08/17/2013)   CARDIAC CATHETERIZATION N/A 09/28/2015   Procedure: Left Heart Cath and Coronary Angiography;  Surgeon: Peter M Martinique, MD;  Location: [facility];  Service: Cardiovascular;  Laterality: N/A;   CHOLECYSTECTOMY     CORONARY STENT INTERVENTION N/A 10/04/2019   Procedure: CORONARY STENT INTERVENTION;  Surgeon: Jettie Booze,  MD;  Location: [facility];  Service: Cardiovascular;  Laterality: N/A;   INTRAVASCULAR PRESSURE WIRE/FFR STUDY N/A 10/04/2019   Procedure: INTRAVASCULAR PRESSURE WIRE/FFR STUDY;  Surgeon: Jettie Booze, MD;  Location: [facility];  Service: Cardiovascular;  Laterality: N/A;   KNEE ARTHROSCOPY Right X 2   LEFT HEART CATH AND CORONARY ANGIOGRAPHY N/A 10/04/2019   Procedure: LEFT HEART CATH AND CORONARY ANGIOGRAPHY;  Surgeon: Jettie Booze, MD;  Location: [facility];  Service: Cardiovascular;  Laterality: N/A;   LEFT HEART CATHETERIZATION WITH CORONARY ANGIOGRAM N/A 08/18/2013   Procedure: LEFT HEART CATHETERIZATION WITH CORONARY ANGIOGRAM;  Surgeon: Peter M Martinique, MD;  Location: [facility] CATH LAB;  Service: Cardiovascular;  Laterality: N/A;   SHOULDER ARTHROSCOPY W/ ROTATOR CUFF REPAIR Left     Current Medications: Current Meds  Medication Sig   acetaminophen (TYLENOL) 500 MG tablet Take 1,000 mg by mouth every 6 (six) hours as needed for mild pain or moderate pain. Reported on 01/09/2016   albuterol (VENTOLIN HFA) 108 (90 Base) MCG/ACT inhaler Inhale 2 puffs into the lungs every 6 (six) hours as needed for wheezing or shortness of breath.   aspirin EC 81 MG EC tablet Take 1 tablet (81 mg total) by mouth daily.   cyclobenzaprine (FLEXERIL) 10 MG tablet Take 1 tablet (10 mg total) by mouth at bedtime as needed for muscle spasms.   glimepiride (AMARYL) 4 MG tablet TAKE 2 TABLETS BY MOUTH ONCE DAILY BEFORE BREAKFAST   glucose blood (ONE TOUCH TEST STRIPS) test strip Up to 4 times daily.  Onetouch Verio.  E11.65   insulin NPH Human (NOVOLIN N) 100 UNIT/ML injection Inject 0.05-0.15 mLs (5-15 Units total) into the skin 2 (two) times daily before a meal.   Lancets (ONETOUCH ULTRASOFT) lancets Up to 4 times daily.  Onetouch Verio.  DxE11.65   levothyroxine (SYNTHROID) 125 MCG tablet TAKE 1 TABLET BY MOUTH ONCE DAILY BEFORE BREAKFAST   losartan (COZAAR) 100 MG tablet Take 1  tablet by mouth once daily   metFORMIN (GLUCOPHAGE) 1000 MG tablet TAKE 1 TABLET BY MOUTH TWICE DAILY WITH A MEAL   Multiple Vitamins-Minerals (MULTIVITAMIN,TX-MINERALS) tablet Take 1 tablet by mouth daily.   nitroGLYCERIN (NITROSTAT) 0.4 MG SL tablet Place 1 tablet (0.4 mg total) under the tongue every 5 (five) minutes as needed for chest pain (CP or SOB).   Omega-3 Fatty Acids (FISH OIL) 1000 MG CAPS Take 1,000 mg by mouth every other day.   omeprazole (PRILOSEC) 20 MG capsule Take 1 capsule (20 mg total) by mouth daily.   rosuvastatin (CRESTOR) 5 MG tablet Take 1 tablet (5 mg total) by mouth daily.   silodosin (RAPAFLO) 8 MG CAPS capsule Take 8 mg by mouth daily.  Allergies:   Patient has no known allergies.   Social History   Socioeconomic History   Marital status: Single    Spouse name: Not on file   Number of children: 0   Years of education: Not on file   Highest education level: Not on file  Occupational History   Occupation: Unemployed  Tobacco Use   Smoking status: Former    Packs/day: 1.50    Years: 24.00    Total pack years: 36.00    Types: Cigarettes    Quit date: 09/10/2007    Years since quitting: 15.1   Smokeless tobacco: Never  Vaping Use   Vaping Use: Never used  Substance and Sexual Activity   Alcohol use: No   Drug use: No   Sexual activity: Yes  Other Topics Concern   Not on file  Social History Narrative   Lives in [place] with parents (cares for parents).  Never married. Unemployed.     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[employer] (stops intermittently while caring for parents)   Social Determinants of Health   Financial Resource Strain: Low Risk  (01/01/2022)   Overall Financial Resource Strain (CARDIA)    Difficulty of Paying Living Expenses: Not very hard  Food Insecurity: No Food Insecurity (01/01/2022)   Hunger Vital Sign    Worried About Running Out of Food in the Last Year: Never true    Ran Out of Food in the Last Year: Never true   Transportation Needs: No Transportation Needs (01/01/2022)   PRAPARE - Hydrologist (Medical): No    Lack of Transportation (Non-Medical): No  Physical Activity: Not on file  Stress: Not on file  Social Connections: Not on file     Family History: The patient's family history includes Asthma in his maternal grandfather; Coronary artery disease in his father; Diabetes in his brother, father, and mother; Hypertension in his father and mother; Liver cancer in his mother; Obesity in his brother and mother; Stroke in his father. There is no history of Colon cancer, Esophageal cancer, Inflammatory bowel disease, Liver disease, Pancreatic cancer, Rectal cancer, or Stomach cancer. He was adopted.  ROS:   Please see the history of present illness.     All other systems reviewed and are negative.  EKGs/Labs/Other Studies Reviewed:    The following studies were reviewed today:  Echo 10/03/2019  1. Left ventricular ejection fraction, by visual estimation, is 55 to  60%. The left ventricle has normal function. Unable to assess for left  ventricular hypertrophy.   2. Definity contrast agent was given IV to delineate the left ventricular  endocardial borders.   3. Left ventricular diastolic parameters are indeterminate.   4. The left ventricle has no regional wall motion abnormalities.   5. Global right ventricle has normal systolic function.The right  ventricular size is mildly enlarged. No increase in right ventricular wall  thickness.   6. Left atrial size was normal.   7. Right atrial size was normal.   8. The mitral valve is grossly normal. No evidence of mitral valve  regurgitation.   9. The tricuspid valve is grossly normal.  10. The tricuspid valve is grossly normal. Tricuspid valve regurgitation  is not demonstrated.  11. The aortic valve is tricuspid. Aortic valve regurgitation is not  visualized. No evidence of aortic valve sclerosis or stenosis.  12.  The pulmonic valve was not well visualized. Pulmonic valve  regurgitation is not visualized.  13. The inferior vena  cava is dilated in size with >50% respiratory  variability, suggesting right atrial pressure of 8 mmHg.  14. The aortic root was not well visualized.    EKG:  EKG is ordered today.  The ekg ordered today demonstrates normal sinus rhythm, no significant ST-T wave changes.  Recent Labs: 12/31/2021: Hemoglobin 13.9; Platelets 211.0; TSH 3.42 04/09/2022: ALT 49; BUN 16; Creatinine, Ser 0.97; Potassium 4.2; Sodium 139  Recent Lipid Panel    Component Value Date/Time   CHOL 131 10/01/2021 0834   TRIG 158 (H) 10/01/2021 0834   HDL 35 (L) 10/01/2021 0834   CHOLHDL 3.7 10/01/2021 0834   CHOLHDL 4 02/14/2020 1035   VLDL 24.0 02/14/2020 1035   LDLCALC 69 10/01/2021 0834   LDLDIRECT 68.0 03/11/2019 1427     Risk Assessment/Calculations:           Physical Exam:    VS:  BP 138/72   Pulse 81   Ht '5\' 9"'$  (1.753 m)   Wt (!) 317 lb 9.6 oz (144.1 kg)   SpO2 95%   BMI 46.90 kg/m        Wt Readings from Last 3 Encounters:  10/31/22 (!) 317 lb 9.6 oz (144.1 kg)  08/13/22 (!) 318 lb 3.2 oz (144.3 kg)  04/08/22 (!) 315 lb 3.2 oz (143 kg)     GEN:  Well nourished, well developed in no acute distress HEENT: Normal NECK: No JVD; No carotid bruits LYMPHATICS: No lymphadenopathy CARDIAC: RRR, no murmurs, rubs, gallops RESPIRATORY:  Clear to auscultation without rales, wheezing or rhonchi  ABDOMEN: Soft, non-tender, non-distended MUSCULOSKELETAL:  No edema; No deformity  SKIN: Warm and dry NEUROLOGIC:  Alert and oriented x 3 PSYCHIATRIC:  Normal affect   ASSESSMENT:    1. Precordial pain   2. Hyperlipidemia associated with type 2 diabetes mellitus ([place])   3. Coronary artery disease involving native coronary artery of native heart without angina pectoris   4. Essential hypertension   5. Controlled type 2 diabetes mellitus without complication, without long-term current  use of insulin (HCC)    PLAN:    In order of problems listed above:  Chest pain: Obtain echocardiogram and PET stress test.  Patient had prolonged chest pain from Saturday all the way until Tuesday.  He did not have any chest pain yesterday or this morning.  EKG is unchanged.  Hyperlipidemia: On rosuvastatin  CAD: See #1.  Will proceed with echocardiogram and PET stress test  Hypertension: Blood pressure stable on current therapy  DM2: Managed by primary care provider.      Shared Decision Making/Informed Consent The risks [chest pain, shortness of breath, cardiac arrhythmias, dizziness, blood pressure fluctuations, myocardial infarction, stroke/transient ischemic attack, nausea, vomiting, allergic reaction, radiation exposure, metallic taste sensation and life-threatening complications (estimated to be 1 in 10,000)], benefits (risk stratification, diagnosing coronary artery disease, treatment guidance) and alternatives of a cardiac PET stress test were discussed in detail with Gabriel [name] and he agrees to proceed.    Medication Adjustments/Labs and Tests Ordered: Current medicines are reviewed at length with the patient today.  Concerns regarding medicines are outlined above.  Orders Placed This Encounter  Procedures   NM PET CT CARDIAC PERFUSION MULTI W/ABSOLUTE BLOODFLOW   Lipid panel   Hepatic function panel   Cardiac Stress Test: Informed Consent Details: Physician/Practitioner Attestation; Transcribe to consent form and obtain patient signature   EKG 12-Lead   ECHOCARDIOGRAM COMPLETE   No orders of the defined types were placed in this encounter.  Patient Instructions  Medication Instructions:   Your physician recommends that you continue on your current medications as directed. Please refer to the Current Medication list given to you today.  *If you need a refill on your cardiac medications before your next appointment, please call your pharmacy*  Lab Work: Your  physician recommends that you return for lab work at your earliest convenience :  Fasting Lipid Panel-DO NOT eat or drink past midnight. Okay to have water and/or black coffee only the morning of labs. Hepatic (Liver) Function test   If you have labs (blood work) drawn today and your tests are completely normal, you will receive your results only by: MyChart Message (if you have MyChart) OR A paper copy in the mail If you have any lab test that is abnormal or we need to change your treatment, we will call you to review the results.  Testing/Procedures: Your physician has requested that you have an echocardiogram. Echocardiography is a painless test that uses sound waves to create images of your heart. It provides your doctor with information about the size and shape of your heart and how well your heart's chambers and valves are working. This procedure takes approximately one hour. There are no restrictions for this procedure. Please do NOT wear cologne, perfume, aftershave, or lotions (deodorant is allowed). Please arrive 15 minutes prior to your appointment time.   CARDIAC PET- Your physician has requested that you have a Cardiac Pet Stress Test. This testing is completed at [facility] ([place], [place] [address]). The schedulers will call you to get this scheduled. Please follow instructions below and call the office with any questions/concerns [phone]).  Follow-Up: At [facility], you and your health needs are our priority.  As part of our continuing mission to provide you with exceptional heart care, we have created designated Provider Care Teams.  These Care Teams include your primary Cardiologist (physician) and Advanced Practice Providers (APPs -  Physician Assistants and Nurse Practitioners) who all work together to provide you with the care you need, when you need it.  Your next appointment:   6 week(s)  Provider:   Minus Breeding, MD  or  APP         Other Instructions    How to Prepare for Your Cardiac PET/CT Stress Test:  1. Please do not take these medications before your test:   Medications that may interfere with the cardiac pharmacological stress agent (ex. nitrates - including erectile dysfunction medications, isosorbide mononitrate, tamulosin or beta-blockers) the day of the exam. (Erectile dysfunction medication should be held for at least 72 hrs prior to test) Theophylline containing medications for 12 hours. Dipyridamole 48 hours prior to the test. Your remaining medications may be taken with water.  2. Nothing to eat or drink, except water, 3 hours prior to arrival time.   NO caffeine/decaffeinated products, or chocolate 12 hours prior to arrival.  3. NO perfume, cologne or lotion  4. Total time is 1 to 2 hours; you may want to bring reading material for the waiting time.  5. Please report to Radiology at the [facility] Main Entrance 30 minutes early for your test.  [place], [place] [address]  Diabetic Preparation:  Hold oral medications. You may take NPH and Lantus insulin. Do not take Humalog or Humulin R (Regular Insulin) the day of your test. Check blood sugars prior to leaving the house. If able to eat  breakfast prior to 3 hour fasting, you may take all medications, including your insulin, Do not worry if you miss your breakfast dose of insulin - start at your next meal.  IF YOU THINK YOU MAY BE PREGNANT, OR ARE NURSING PLEASE INFORM THE TECHNOLOGIST.  In preparation for your appointment, medication and supplies will be purchased.  Appointment availability is limited, so if you need to cancel or reschedule, please call the Radiology Department at [phone]  24 hours in advance to avoid a cancellation fee of $100.00  What to Expect After you Arrive:  Once you arrive and check in for your appointment, you will be taken to a preparation room within  the Radiology Department.  A technologist or Nurse will obtain your medical history, verify that you are correctly prepped for the exam, and explain the procedure.  Afterwards,  an IV will be started in your arm and electrodes will be placed on your skin for EKG monitoring during the stress portion of the exam. Then you will be escorted to the PET/CT scanner.  There, staff will get you positioned on the scanner and obtain a blood pressure and EKG.  During the exam, you will continue to be connected to the EKG and blood pressure machines.  A small, safe amount of a radioactive tracer will be injected in your IV to obtain a series of pictures of your heart along with an injection of a stress agent.    After your Exam:  It is recommended that you eat a meal and drink a caffeinated beverage to counter act any effects of the stress agent.  Drink plenty of fluids for the remainder of the day and urinate frequently for the first couple of hours after the exam.  Your doctor will inform you of your test results within 7-10 business days.  For questions about your test or how to prepare for your test, please call: Marchia Bond, Cardiac Imaging Nurse Navigator  Gordy Clement, Cardiac Imaging Nurse Navigator Office: [phone]     Hilbert Corrigan, [place]  11/02/2022 11:52 PM    [place]

## 2022-11-02 ENCOUNTER — Encounter: Payer: Self-pay | Admitting: Physician Assistant

## 2022-11-04 ENCOUNTER — Telehealth (HOSPITAL_COMMUNITY): Payer: Self-pay | Admitting: Physician Assistant

## 2022-11-04 NOTE — Telephone Encounter (Signed)
I called to schedule echocardiogram and PET CARDIAC CT and patient does not wish to schedule at this time. He is SELF PAY and just can't afford to have at this time. He states that he is feeling better and thinks it was just some heart burn. I informed patient if he ever felt bad again with chest pain to call the office and never hesitate to go the the ER. Order will be removed from the active Wq's. Thank you!

## 2022-11-19 ENCOUNTER — Ambulatory Visit (INDEPENDENT_AMBULATORY_CARE_PROVIDER_SITE_OTHER): Payer: Self-pay | Admitting: Family Medicine

## 2022-11-19 ENCOUNTER — Encounter: Payer: Self-pay | Admitting: Family Medicine

## 2022-11-19 VITALS — BP 128/64 | HR 84 | Temp 97.2°F | Ht 69.0 in | Wt 319.6 lb

## 2022-11-19 DIAGNOSIS — E785 Hyperlipidemia, unspecified: Secondary | ICD-10-CM

## 2022-11-19 DIAGNOSIS — E039 Hypothyroidism, unspecified: Secondary | ICD-10-CM

## 2022-11-19 DIAGNOSIS — E1169 Type 2 diabetes mellitus with other specified complication: Secondary | ICD-10-CM

## 2022-11-19 DIAGNOSIS — I10 Essential (primary) hypertension: Secondary | ICD-10-CM

## 2022-11-19 DIAGNOSIS — I25118 Atherosclerotic heart disease of native coronary artery with other forms of angina pectoris: Secondary | ICD-10-CM

## 2022-11-19 DIAGNOSIS — E119 Type 2 diabetes mellitus without complications: Secondary | ICD-10-CM

## 2022-11-19 NOTE — Patient Instructions (Addendum)
Get Diabetic Eye Exam Scheduled.  appears to have a small stye- advised warm compresses. Conjunctiva otherwise normal- no conjunctivitis- does not need medicine. Eye drops helping- wonder if allergic element to this- he will continue drops and monitor.  See [place] back if worsening- seek immediate care if blurry vision  Please stop by lab before you go If you have mychart- we will send your results within 3 business days of [place] receiving them.  If you do not have mychart- we will call you about results within 5 business days of [place] receiving them.  *please also note that you will see labs on mychart as soon as they post. I will later go in and write notes on them- will say "notes from Dr. Yong Channel"   Recommended follow up: Return in about 14 weeks (around 02/25/2023) for followup or sooner if needed.Schedule b4 you leave.

## 2022-11-19 NOTE — Progress Notes (Signed)
Phone [phone] In person visit   Subjective:   Gabriel [name] is a 57 y.o. year old very pleasant male patient who presents for/with See problem oriented charting Chief Complaint  Patient presents with   Follow-up    Pt c/o right eye irritation.   Diabetes   Hypertension    Pt c/o headaches last week.   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    Priority: Medium    Morbid obesity ([place]) 08/10/2013    Priority: Medium    Essential hypertension 02/06/2011    Priority: Medium    Hypothyroidism 02/06/2011    Priority: Medium    Hyperlipidemia associated with type 2 diabetes mellitus ([place]) 02/06/2011    Priority: Medium    Colon cancer screening 10/27/2018    Priority: Low   Midepigastric pain 09/27/2018    Priority: Low   Change in bowel habits 09/27/2018    Priority: Low   Family hx colonic polyps 09/27/2018    Priority: Low   BRBPR (bright red blood per rectum) 09/27/2018    Priority: Low   Left lateral epicondylitis 03/18/2016    Priority: Low   GERD (gastroesophageal reflux disease) 02/01/2015    Priority: Low   Dysphagia 09/30/2013    Priority: Low   Dyslipidemia 09/30/2021   Myalgia 03/27/2021   Chest pain of uncertain etiology [id number]    Medications- reviewed and updated Current Outpatient Medications  Medication Sig Dispense Refill   acetaminophen (TYLENOL) 500 MG tablet Take 1,000 mg by mouth every 6 (six) hours as needed for mild pain or moderate pain. Reported on 01/09/2016     albuterol (VENTOLIN HFA) 108 (90 Base) MCG/ACT inhaler Inhale 2 puffs into the lungs every 6 (six) hours as needed for wheezing or shortness of breath. 8 g 0   aspirin EC 81 MG EC tablet Take 1 tablet (81 mg total) by mouth daily.     cyclobenzaprine (FLEXERIL) 10 MG  tablet Take 1 tablet (10 mg total) by mouth at bedtime as needed for muscle spasms. 14 tablet 0   glimepiride (AMARYL) 4 MG tablet TAKE 2 TABLETS BY MOUTH ONCE DAILY BEFORE BREAKFAST 180 tablet 3   glucose blood (ONE TOUCH TEST STRIPS) test strip Up to 4 times daily.  Onetouch Verio.  E11.65 100 each 12   insulin NPH Human (NOVOLIN N) 100 UNIT/ML injection Inject 0.05-0.15 mLs (5-15 Units total) into the skin 2 (two) times daily before a meal. 10 mL 11   Lancets (ONETOUCH ULTRASOFT) lancets Up to 4 times daily.  Onetouch Verio.  DxE11.65 100 each 12   levothyroxine (SYNTHROID) 125 MCG tablet TAKE 1 TABLET BY MOUTH ONCE DAILY BEFORE BREAKFAST 90 tablet 0   losartan (COZAAR) 100 MG tablet Take 1 tablet by mouth once daily 90 tablet 3   metFORMIN (GLUCOPHAGE) 1000 MG tablet TAKE 1 TABLET BY MOUTH TWICE DAILY WITH A MEAL 180 tablet 0   Multiple Vitamins-Minerals (MULTIVITAMIN,TX-MINERALS) tablet Take 1 tablet by mouth daily.     nitroGLYCERIN (NITROSTAT) 0.4 MG SL tablet Place 1 tablet (0.4 mg total) under the tongue every 5 (five) minutes as needed for chest pain (CP or SOB). 25 tablet 3   Omega-3 Fatty Acids (FISH OIL) 1000 MG CAPS Take 1,000 mg by mouth every other day.  omeprazole (PRILOSEC) 20 MG capsule Take 1 capsule (20 mg total) by mouth daily. 30 capsule 3   rosuvastatin (CRESTOR) 5 MG tablet Take 1 tablet (5 mg total) by mouth daily. 90 tablet 3   silodosin (RAPAFLO) 8 MG CAPS capsule Take 8 mg by mouth daily.     No current facility-administered medications for this visit.     Objective:  BP 128/64   Pulse 84   Temp (!) 97.2 F (36.2 C)   Ht '5\' 9"'$  (1.753 m)   Wt (!) 319 lb 9.6 oz (145 kg)   SpO2 95%   BMI 47.20 kg/m  Gen: NAD, resting comfortably Mild bilateral erythema of sclera, conjunctiva normal other than small stye on left lower lid noted though CV: RRR no murmurs rubs or gallops Lungs: CTAB no crackles, wheeze, rhonchi Ext: trace edema Skin: warm, dry      Assessment and Plan   # Right eye irritation S:had headache for 2-3 days last week and had some runny nose. Had some irritation under right eyelid. Picked up some eye drops that are helping.  No purulent drainage A/P: appears to have a small stye- advised warm compresses. Conjunctiva otherwise normal- does not need medicine. Eye drops helping- wonder if allergic element to his- he will continue drops and monitor.      # CAD  #hyperlipidemia S: Medication:Aspirin 81 mg, fish oil, rosuvastatin 5 mg daily -Was recently seen by cardiology on 10/31/2022 with echocardiogram and PET stress test ordered due to precordial chest pain-tests are ordered but not yet scheduled. He thinks it was heartburn- symptoms resolved Lab Results  Component Value Date   CHOL 131 10/01/2021   HDL 35 (L) 10/01/2021   LDLCALC 69 10/01/2021   LDLDIRECT 68.0 03/11/2019   TRIG 158 (H) 10/01/2021   CHOLHDL 3.7 10/01/2021   A/P: CAD history- encouraged him to consider completing cardiology workup- he declines for now- was taking otc pepcid instead of PPI and he wants to hold off on testing as resolved back on PPI Lipids- at goal last year - will update   # Diabetes with morbid obesity S: Medication: Glimepiride 8 mg, insulin NPH twice daily 12 units in the morning, 10 units in the p.m.,  metformin 1000 mg twice daily CBGs- mornings around 200, can be 280 later in day. Wt up 2 lbs form last visit Lab Results  Component Value Date   HGBA1C 8.6 (A) 08/13/2022   HGBA1C 9.9 (H) 04/09/2022   HGBA1C 9.7 (H) 12/31/2021   A/P: hopefully stable at least but sounds could be worse- update a1c today. Continue current meds for now Morbid obesity- Encouraged need for healthy eating, regular exercise, weight loss.    #hypertension S: medication: Losartan 100 mg Home readings #s: readings at home roughly similar to today BP Readings from Last 3 Encounters:  11/19/22 128/64  10/31/22 138/72  08/13/22 124/72  A/P: stable-  continue current medicines   #hypothyroidism S: compliant On thyroid medication-levothyroxine 125 mcg Lab Results  Component Value Date   TSH 3.42 12/31/2021  A/P:hopefully stable- update tsh today. Continue current meds for now     # BPH-on Rapaflo 8 mg- reasonable control    # GERD S:Medication: Omeprazole 20 mg- flare up in reflux on pepcid B12 levels related to PPI use: Under 400 in past A/P: reasonable control back on omeprazole -taking b12 now so we opted to hold off on testing as self pay    Recommended follow up: Return in about 14  weeks (around 02/25/2023) for followup or sooner if needed.Schedule b4 you leave.   Lab/Order associations:   ICD-10-CM   1. Diabetes mellitus without complication (HCC)  [id number]     2. Atherosclerosis of native coronary artery of native heart with stable angina pectoris ([place])  I25.118     3. Essential hypertension  I10     4. Hyperlipidemia associated with type 2 diabetes mellitus ([place])  E11.69    E78.5     5. Hypothyroidism, unspecified type  E03.9     6. Morbid obesity ([place])  E66.01       No orders of the defined types were placed in this encounter.   Return precautions advised.  Garret Reddish, MD

## 2022-11-20 LAB — TSH: TSH: 2.68 u[IU]/mL (ref 0.35–5.50)

## 2022-11-20 LAB — COMPREHENSIVE METABOLIC PANEL
ALT: 46 U/L (ref 0–53)
AST: 30 U/L (ref 0–37)
Albumin: 4.1 g/dL (ref 3.5–5.2)
Alkaline Phosphatase: 85 U/L (ref 39–117)
BUN: 16 mg/dL (ref 6–23)
CO2: 27 mEq/L (ref 19–32)
Calcium: 9.4 mg/dL (ref 8.4–10.5)
Chloride: 102 mEq/L (ref 96–112)
Creatinine, Ser: 0.87 mg/dL (ref 0.40–1.50)
GFR: 97.09 mL/min (ref >60.00)
Glucose, Bld: 235 mg/dL — ABNORMAL HIGH (ref 70–99)
Potassium: 4 mEq/L (ref 3.5–5.1)
Sodium: 138 mEq/L (ref 135–145)
Total Bilirubin: 0.8 mg/dL (ref 0.2–1.2)
Total Protein: 6.4 g/dL (ref 6.0–8.3)

## 2022-11-20 LAB — CBC WITH DIFFERENTIAL/PLATELET
Basophils Absolute: 0 10*3/uL (ref 0.0–0.1)
Basophils Relative: 0.7 % (ref 0.0–3.0)
Eosinophils Absolute: 0.3 10*3/uL (ref 0.0–0.7)
Eosinophils Relative: 5.2 % — ABNORMAL HIGH (ref 0.0–5.0)
HCT: 42.1 % (ref 39.0–52.0)
Hemoglobin: 13.9 g/dL (ref 13.0–17.0)
Lymphocytes Relative: 39.5 % (ref 12.0–46.0)
Lymphs Abs: 2 10*3/uL (ref 0.7–4.0)
MCHC: 33.1 g/dL (ref 30.0–36.0)
MCV: 87 fl (ref 78.0–100.0)
Monocytes Absolute: 0.4 10*3/uL (ref 0.1–1.0)
Monocytes Relative: 7.1 % (ref 3.0–12.0)
Neutro Abs: 2.3 10*3/uL (ref 1.4–7.7)
Neutrophils Relative %: 47.5 % (ref 43.0–77.0)
Platelets: 211 10*3/uL (ref 150.0–400.0)
RBC: 4.84 Mil/uL (ref 4.22–5.81)
RDW: 13.7 % (ref 11.5–15.5)
WBC: 5 10*3/uL (ref 4.0–10.5)

## 2022-11-20 LAB — LIPID PANEL
Cholesterol: 117 mg/dL (ref 0–200)
HDL: 36.5 mg/dL — ABNORMAL LOW (ref >39.00)
LDL Cholesterol: 44 mg/dL (ref 0–99)
NonHDL: 80.29
Total CHOL/HDL Ratio: 3
Triglycerides: 183 mg/dL — ABNORMAL HIGH (ref 0.0–149.0)
VLDL: 36.6 mg/dL (ref 0.0–40.0)

## 2022-11-20 LAB — HEMOGLOBIN A1C: Hgb A1c MFr Bld: 9.5 % — ABNORMAL HIGH (ref 4.6–6.5)

## 2022-11-20 LAB — MICROALBUMIN / CREATININE URINE RATIO
Creatinine,U: 92.7 mg/dL
Microalb Creat Ratio: 0.8 mg/g (ref 0.0–30.0)
Microalb, Ur: 0.7 mg/dL (ref 0.0–1.9)

## 2022-12-25 ENCOUNTER — Ambulatory Visit: Payer: Self-pay | Admitting: Cardiology

## 2023-01-09 ENCOUNTER — Other Ambulatory Visit: Payer: Self-pay | Admitting: Family Medicine

## 2023-01-23 ENCOUNTER — Ambulatory Visit (INDEPENDENT_AMBULATORY_CARE_PROVIDER_SITE_OTHER): Payer: Self-pay | Admitting: Family Medicine

## 2023-01-23 ENCOUNTER — Encounter: Payer: Self-pay | Admitting: Family Medicine

## 2023-01-23 VITALS — BP 128/72 | HR 97 | Temp 97.0°F | Ht 69.0 in | Wt 312.6 lb

## 2023-01-23 DIAGNOSIS — M5442 Lumbago with sciatica, left side: Secondary | ICD-10-CM

## 2023-01-23 DIAGNOSIS — Z794 Long term (current) use of insulin: Secondary | ICD-10-CM

## 2023-01-23 DIAGNOSIS — E039 Hypothyroidism, unspecified: Secondary | ICD-10-CM

## 2023-01-23 DIAGNOSIS — E119 Type 2 diabetes mellitus without complications: Secondary | ICD-10-CM

## 2023-01-23 DIAGNOSIS — I10 Essential (primary) hypertension: Secondary | ICD-10-CM

## 2023-01-23 DIAGNOSIS — Z7984 Long term (current) use of oral hypoglycemic drugs: Secondary | ICD-10-CM

## 2023-01-23 MED ORDER — TIZANIDINE HCL 4 MG PO TABS: 4.0000 mg | ORAL_TABLET | Freq: Three times a day (TID) | ORAL | 0 refills | Status: AC | PRN

## 2023-01-23 MED ORDER — LEVOTHYROXINE SODIUM 125 MCG PO TABS: 125.0000 ug | ORAL_TABLET | Freq: Every day | ORAL | 3 refills | Status: DC

## 2023-01-23 NOTE — Patient Instructions (Addendum)
diabetic eye exam scheduled tomorrow- have them send [place] a copy  diabetes poorly controlled- we are going to increase the NPH in the morning to 16 units and evening to 13 units and have him update me in 2 weeks  Please go to [place]  [facility] X-[facility] (but films may take up to a week to process - please tell us if you have not received report within a week of testing) - located [address] across the [address] from [place] - in the basement - Hours: 8:30-5:00 PM M-F (with lunch from 12:30- 1 PM). You do NOT need an appointment.   We considered physical therapy but you wanted to see how things go with x-ray first  Recommended follow up: Return for next already scheduled visit or sooner if needed.

## 2023-01-23 NOTE — Progress Notes (Signed)
Phone [phone] In person visit   Subjective:   Gabriel [name] is a 57 y.o. year old very pleasant male patient who presents for/with See problem oriented charting Chief Complaint  Patient presents with   left hip pain    Pt c/o left hip and leg pain after falling off of the bed about 4 weeks ago that goes down to the top of his left foot.   right eye pain    Pt c/o right eye pain, he is going to eye doc tomorrow for diabetic eye exam and to get the eye pain checked out.    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    Priority: Medium    Morbid obesity (HCC) 08/10/2013    Priority: Medium    Essential hypertension 02/06/2011    Priority: Medium    Hypothyroidism 02/06/2011    Priority: Medium    Hyperlipidemia associated with type 2 diabetes mellitus (HCC) 02/06/2011    Priority: Medium    Colon cancer screening 10/27/2018    Priority: Low   Midepigastric pain 09/27/2018    Priority: Low   Change in bowel habits 09/27/2018    Priority: Low   Family hx colonic polyps 09/27/2018    Priority: Low   BRBPR (bright red blood per rectum) 09/27/2018    Priority: Low   Left lateral epicondylitis 03/18/2016    Priority: Low   GERD (gastroesophageal reflux disease) 02/01/2015    Priority: Low   Dysphagia 09/30/2013    Priority: Low   Dyslipidemia 09/30/2021   Myalgia 03/27/2021   Chest pain of uncertain etiology 10/03/2019    Medications- reviewed and updated Current Outpatient Medications  Medication Sig Dispense Refill   acetaminophen (TYLENOL) 500 MG tablet Take 1,000 mg by mouth every 6 (six) hours as needed for mild pain or moderate pain. Reported on 01/09/2016     albuterol (VENTOLIN HFA) 108 (90 Base) MCG/ACT inhaler Inhale 2 puffs into the lungs every 6  (six) hours as needed for wheezing or shortness of breath. 8 g 0   aspirin EC 81 MG EC tablet Take 1 tablet (81 mg total) by mouth daily.     glimepiride (AMARYL) 4 MG tablet TAKE 2 TABLETS BY MOUTH ONCE DAILY BEFORE BREAKFAST 180 tablet 3   glucose blood (ONE TOUCH TEST STRIPS) test strip Up to 4 times daily.  Onetouch Verio.  E11.65 100 each 12   insulin NPH Human (NOVOLIN N) 100 UNIT/ML injection Inject 0.05-0.15 mLs (5-15 Units total) into the skin 2 (two) times daily before a meal. 10 mL 11   Lancets (ONETOUCH ULTRASOFT) lancets Up to 4 times daily.  Onetouch Verio.  DxE11.65 100 each 12   losartan (COZAAR) 100 MG tablet Take 1 tablet by mouth once daily 90 tablet 3   metFORMIN (GLUCOPHAGE) 1000 MG tablet TAKE 1 TABLET BY MOUTH TWICE DAILY WITH A MEAL 180 tablet 0   Multiple Vitamins-Minerals (MULTIVITAMIN,TX-MINERALS) tablet Take 1 tablet by mouth daily.     nitroGLYCERIN (NITROSTAT) 0.4 MG SL tablet Place 1 tablet (0.4 mg total) under the tongue every 5 (five) minutes as needed for chest pain (CP or SOB). 25 tablet 3   Omega-3 Fatty Acids (FISH OIL) 1000 MG CAPS Take 1,000 mg by mouth every other day.  omeprazole (PRILOSEC) 20 MG capsule Take 1 capsule (20 mg total) by mouth daily. 30 capsule 3   rosuvastatin (CRESTOR) 5 MG tablet Take 1 tablet (5 mg total) by mouth daily. 90 tablet 3   silodosin (RAPAFLO) 8 MG CAPS capsule Take 8 mg by mouth daily.     tiZANidine (ZANAFLEX) 4 MG tablet Take 1 tablet (4 mg total) by mouth every 8 (eight) hours as needed for muscle spasms (do not drive for 8 hours after taking). 30 tablet 0   levothyroxine (SYNTHROID) 125 MCG tablet Take 1 tablet (125 mcg total) by mouth daily before breakfast. 90 tablet 3   No current facility-administered medications for this visit.     Objective:  BP 128/72   Pulse 97   Temp (!) 97 F (36.1 C)   Ht 5\' 9"  (1.753 m)   Wt (!) 312 lb 9.6 oz (141.8 kg)   SpO2 96%   BMI 46.16 kg/m  Gen: NAD, resting  comfortably Bilateral eyes largely normal, right lower eyelid with small white pocket noted-potentially lingering from prior stye CV: RRR no murmurs rubs or gallops Lungs: CTAB no crackles, wheeze, rhonchi Abdomen: soft/nontender/nondistended/normal bowel sounds. No rebound or guarding.  Ext: no edema Skin: warm, dry Back - Normal skin, Spine with normal alignment and no deformity.  No tenderness to vertebral process palpation.  Paraspinous muscles are tender and with spasm on the left side.  Patient reports some pain with flexion and he cannot touch his toe by nearly a foot.  Full extension.  Neuro-  5/5 strength lower extremities     Assessment and Plan   # left back pain/hip pain/radicular pain S: Patient with left hip pain that started after falling on the bed while dreaming about 4 weeks ago (even had scalp laceration as he fell and saw urgent care. Pain startes left buttocks and lateral hip and goes down lateral legand all the way down to anterior ankle and foot.   Also having some back pain.  No leg weakness or saddle anesthesia reported A/P: Patient with left low back pain radiating in the left hip and into the left lower leg starting after a fall about 4 weeks ago-we need to get x-rays to evaluate further.  Strongly consider prednisone but his blood sugars too high for that-he does request muscle relaxant to try to help with pain but advised no driving for 8 hours after use so particular helpful in the evening   # Right eye irritation S: Patient with headache and mild irritation at right lower eyelid last visit and appeared to have a stye-was improving at that time with over-the-counter drops he was going to continue regimen unless symptoms fail to improve  Area went down but still continues to itch like something is stuck under eyelid. Sees eye doctor tomorrow and is hoping to get some answers.   No blurry vision or double vision A/P: Under the right eyelid there is a very slight  white pocket-perhaps residual from prior stye-has close follow-up with ophthalmology and we opted not to intervene at this time   # Diabetes S: Medication: Glimepiride 8 mg, insulin NPH twice daily14 units in the morning, 11 units in the p.m- but he's up to 12 units, metformin 1000 mg twice daily CBGs- 240 this morning. Usually in 200's - no lows- lowest he has seen is 130 Exercise and diet- active but no intentional regular exercise, has cut out soda except for sparingly at restaurant. Down 7 lbs- has tried  to improve diet- has tried to cut down on breads which helps some. Not a lot of chicken or veggies- discussed boosting both.  Lab Results  Component Value Date   HGBA1C 9.5 (H) 11/19/2022   HGBA1C 8.6 (A) 08/13/2022   HGBA1C 9.9 (H) 04/09/2022   A/P: diabetes poorly controlled- we are going to increase the NPH in the morning to 16 units and evening to 13 units and have him update me in 2 weeks  #hypertension S: medication: Losartan 100 mg Home readings #s: not checking  BP Readings from Last 3 Encounters:  01/23/23 128/72  11/19/22 128/64  10/31/22 138/72  A/P: stable- continue current medicines   #hypothyroidism S: compliant On thyroid medication-levothyroxine 125 mcg Lab Results  Component Value Date   TSH 2.68 11/19/2022   A/P: Has been conrolled but he ran out of medication and needs refill-refill today-continue current medication  Recommended follow up: Return for next already scheduled visit or sooner if needed. Future Appointments  Date Time Provider Department Center  02/25/2023  3:00 PM Shelva Majestic, MD [facility]    Lab/Order associations:   ICD-10-CM   1. Acute left-sided low back pain with left-sided sciatica  M54.42 DG Lumbar Spine Complete    2. Essential hypertension  I10     3. Diabetes mellitus without complication (HCC)  E11.9     4. Hypothyroidism, unspecified type  E03.9       Meds ordered this encounter  Medications   levothyroxine  (SYNTHROID) 125 MCG tablet    Sig: Take 1 tablet (125 mcg total) by mouth daily before breakfast.    Dispense:  90 tablet    Refill:  3   tiZANidine (ZANAFLEX) 4 MG tablet    Sig: Take 1 tablet (4 mg total) by mouth every 8 (eight) hours as needed for muscle spasms (do not drive for 8 hours after taking).    Dispense:  30 tablet    Refill:  0    Return precautions advised.  Tana Conch, MD

## 2023-01-24 ENCOUNTER — Ambulatory Visit (INDEPENDENT_AMBULATORY_CARE_PROVIDER_SITE_OTHER)
Admission: RE | Admit: 2023-01-24 | Discharge: 2023-01-24 | Disposition: A | Discharge status: Home / Self Care | Payer: Self-pay | Source: Ambulatory Visit | Attending: Family Medicine | Admitting: Family Medicine

## 2023-01-24 DIAGNOSIS — M5442 Lumbago with sciatica, left side: Secondary | ICD-10-CM

## 2023-01-30 ENCOUNTER — Telehealth: Payer: Self-pay | Admitting: Family Medicine

## 2023-01-30 ENCOUNTER — Encounter: Payer: Self-pay | Admitting: Family Medicine

## 2023-01-30 DIAGNOSIS — I700 Atherosclerosis of aorta: Secondary | ICD-10-CM | POA: Insufficient documentation

## 2023-01-30 NOTE — Telephone Encounter (Signed)
Pt returned call. Apologized for no answer, looking forward to call back when able.

## 2023-01-30 NOTE — Telephone Encounter (Signed)
Patient called to check in on imaging results. Requests a callback on his cell, [phone].

## 2023-01-30 NOTE — Telephone Encounter (Signed)
Called and lm for pt tcb. 

## 2023-02-25 ENCOUNTER — Ambulatory Visit: Payer: Self-pay | Admitting: Family Medicine

## 2023-03-31 ENCOUNTER — Other Ambulatory Visit: Payer: Self-pay | Admitting: Family Medicine

## 2023-03-31 ENCOUNTER — Other Ambulatory Visit: Payer: Self-pay | Admitting: Cardiology

## 2023-03-31 ENCOUNTER — Ambulatory Visit (INDEPENDENT_AMBULATORY_CARE_PROVIDER_SITE_OTHER): Payer: Self-pay | Admitting: Family Medicine

## 2023-03-31 ENCOUNTER — Encounter: Payer: Self-pay | Admitting: Family Medicine

## 2023-03-31 VITALS — BP 140/64 | HR 95 | Temp 98.6°F | Ht 69.0 in | Wt 313.0 lb

## 2023-03-31 DIAGNOSIS — I10 Essential (primary) hypertension: Secondary | ICD-10-CM

## 2023-03-31 DIAGNOSIS — E039 Hypothyroidism, unspecified: Secondary | ICD-10-CM

## 2023-03-31 DIAGNOSIS — I700 Atherosclerosis of aorta: Secondary | ICD-10-CM

## 2023-03-31 DIAGNOSIS — Z794 Long term (current) use of insulin: Secondary | ICD-10-CM

## 2023-03-31 DIAGNOSIS — Z7984 Long term (current) use of oral hypoglycemic drugs: Secondary | ICD-10-CM

## 2023-03-31 DIAGNOSIS — E785 Hyperlipidemia, unspecified: Secondary | ICD-10-CM

## 2023-03-31 DIAGNOSIS — E119 Type 2 diabetes mellitus without complications: Secondary | ICD-10-CM

## 2023-03-31 DIAGNOSIS — E1169 Type 2 diabetes mellitus with other specified complication: Secondary | ICD-10-CM

## 2023-03-31 DIAGNOSIS — I25118 Atherosclerotic heart disease of native coronary artery with other forms of angina pectoris: Secondary | ICD-10-CM

## 2023-03-31 MED ORDER — INSULIN NPH ISOPHANE & REGULAR (70-30) 100 UNIT/ML ~~LOC~~ SUSP: 10.0000 [IU] | Freq: Two times a day (BID) | SUBCUTANEOUS | 11 refills | Status: AC

## 2023-03-31 MED ORDER — NITROGLYCERIN 0.4 MG SL SUBL: 0.4000 mg | SUBLINGUAL_TABLET | SUBLINGUAL | 3 refills | Status: AC | PRN

## 2023-03-31 NOTE — Patient Instructions (Addendum)
Have diabetic eye exam sent to [place] next week [phone].  Monitor blood pressure at home for a week and update me with readings on mychart  Stop glimepiride  Change NPH insulin twice a day to NPH-right insulin twice a day (long and medium acting coverage)  - lets have you do 20 units before breakfast and 10 units before dinner- as long as blood sugar over 90 - update me in a week with your blood sugar waking up, 2 hours after breakfast, before lunch, 2 hours after lunch, before dinner and 2 hours after dinner -any sugar below 80 let me know immediately and skip next insulin until we start  Get back in with cardiology as well  Recommended follow up: Return in about 14 weeks (around 07/07/2023) for followup or sooner if needed.Schedule b4 you leave.

## 2023-03-31 NOTE — Progress Notes (Signed)
Phone [phone] In person visit   Subjective:   Gabriel [name] is a 57 y.o. year old very pleasant male patient who presents for/with See problem oriented charting Chief Complaint  Patient presents with   Medical Management of Chronic Issues   Diabetes    Pt states blood sugars are still "up there" with over 300 at times.    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    Priority: Medium    Morbid obesity (HCC) 08/10/2013    Priority: Medium    Essential hypertension 02/06/2011    Priority: Medium    Hypothyroidism 02/06/2011    Priority: Medium    Hyperlipidemia associated with type 2 diabetes mellitus (HCC) 02/06/2011    Priority: Medium    Colon cancer screening 10/27/2018    Priority: Low   Midepigastric pain 09/27/2018    Priority: Low   Change in bowel habits 09/27/2018    Priority: Low   Family hx colonic polyps 09/27/2018    Priority: Low   BRBPR (bright red blood per rectum) 09/27/2018    Priority: Low   Left lateral epicondylitis 03/18/2016    Priority: Low   GERD (gastroesophageal reflux disease) 02/01/2015    Priority: Low   Dysphagia 09/30/2013    Priority: Low   Aortic atherosclerosis (HCC) 01/30/2023   Dyslipidemia 09/30/2021   Myalgia 03/27/2021   Chest pain of uncertain etiology 10/03/2019    Medications- reviewed and updated Current Outpatient Medications  Medication Sig Dispense Refill   acetaminophen (TYLENOL) 500 MG tablet Take 1,000 mg by mouth every 6 (six) hours as needed for mild pain or moderate pain. Reported on 01/09/2016     aspirin EC 81 MG EC tablet Take 1 tablet (81 mg total) by mouth daily.     glucose blood (ONE TOUCH TEST STRIPS) test strip Up to 4 times daily.  Onetouch Verio.  E11.65 100 each 12   insulin NPH-regular  Human (70-30) 100 UNIT/ML injection Inject 10-30 Units into the skin 2 (two) times daily with a meal. Start with 20 units am and 10 units in the PM and update me after a week 10 mL 11   Lancets (ONETOUCH ULTRASOFT) lancets Up to 4 times daily.  Onetouch Verio.  DxE11.65 100 each 12   levothyroxine (SYNTHROID) 125 MCG tablet Take 1 tablet (125 mcg total) by mouth daily before breakfast. 90 tablet 3   losartan (COZAAR) 100 MG tablet Take 1 tablet by mouth once daily 30 tablet 0   metFORMIN (GLUCOPHAGE) 1000 MG tablet TAKE 1 TABLET BY MOUTH TWICE DAILY WITH A MEAL 180 tablet 0   Multiple Vitamins-Minerals (MULTIVITAMIN,TX-MINERALS) tablet Take 1 tablet by mouth daily.     Omega-3 Fatty Acids (FISH OIL) 1000 MG CAPS Take 1,000 mg by mouth every other day.     omeprazole (PRILOSEC) 20 MG capsule Take 1 capsule (20 mg total) by mouth daily. 30 capsule 3   rosuvastatin (CRESTOR) 5 MG tablet Take 1 tablet (5 mg total) by mouth daily. 90 tablet 3   silodosin (RAPAFLO) 8 MG CAPS capsule Take 8 mg by mouth daily.     tiZANidine (ZANAFLEX) 4 MG tablet Take 1 tablet (4 mg total) by mouth every 8 (eight) hours as needed for muscle spasms (do not drive for  8 hours after taking). 30 tablet 0   nitroGLYCERIN (NITROSTAT) 0.4 MG SL tablet Place 1 tablet (0.4 mg total) under the tongue every 5 (five) minutes as needed for chest pain (CP or SOB). 25 tablet 3   No current facility-administered medications for this visit.     Objective:  BP (!) 140/64   Pulse 95   Temp 98.6 F (37 C)   Ht 5\' 9"  (1.753 m)   Wt (!) 313 lb (142 kg)   SpO2 95%   BMI 46.22 kg/m  Gen: NAD, resting comfortably CV: RRR no murmurs rubs or gallops Lungs: CTAB no crackles, wheeze, rhonchi Ext: trace edema Skin: warm, dry Brace just below left knee- reports helpful for back    Assessment and Plan   #Sciatic nerve on left side- working with chiropractor and has brace on knee that he reports helps.sees them tomorrow - offered sports  medicine or orthopedic- declines for now  # CAD  #hyperlipidemia #aortic atherosclerosis S: Medication:Aspirin 81 mg, fish oil, rosuvastatin 5 mg daily. Not needing nitroglycerin- not on imdur Lab Results  Component Value Date   CHOL 117 11/19/2022   HDL 36.50 (L) 11/19/2022   LDLCALC 44 11/19/2022   LDLDIRECT 68.0 03/11/2019   TRIG 183.0 (H) 11/19/2022   CHOLHDL 3 11/19/2022   A/P: coronary artery disease asymptomatic continue current medications Lipids at gaol- continue current medications- work on weight loss for triglyceride(s)  Aortic atherosclerosis (presumed stable)- LDL goal ideally <70 - has been at goal- needs to work on weight loss as well for this  # Diabetes with morbid obesity S: Medication: Glimepiride 8 mg, insulin NPH twice daily 15 units in the morning, 14 units in the p.m., metformin 1000 mg twice daily CBGs- 200 -212 in morning. Lowest he has seen is 130 Exercise and diet- not exercising due to sciatic nerve issues Lab Results  Component Value Date   HGBA1C 9.5 (H) 11/19/2022   HGBA1C 8.6 (A) 08/13/2022   HGBA1C 9.9 (H) 04/09/2022  A/P: diabetes poorly controlled- Stop glimepiride  Change NPH insulin twice a day to NPH-right insulin twice a day (long and medium acting coverage)  - lets have you do 20 units before breakfast and 10 units before dinner- as long as blood sugar over 90 - update me in a week with your blood sugar waking up, 2 hours after breakfast, before lunch, 2 hours after lunch, before dinner and 2 hours after dinner -any sugar below 80 let me know immediately and skip next insulin until we start  #hypertension S: medication: Losartan 100 mg Home readings #s: occasional checks BP Readings from Last 3 Encounters:  03/31/23 (!) 140/64  01/23/23 128/72  11/19/22 128/64  A/P: blood pressure hair high today but usually controlled- he's going to monitor at home and let us know  #hypothyroidism S: compliant On thyroid medication-levothyroxine 125  mcg Lab Results  Component Value Date   TSH 2.68 11/19/2022  A/P:stable- continue current medicines     # BPH-on Rapaflo 8 mg- helpful   # GERD S:Medication: Omeprazole 20 mg B12 levels related to PPI use: Under 400 in past. Taking B12 daily A/P: stable- continue current medicines     Recommended follow up: Return in about 1 month (around 05/01/2023) for followup or sooner if needed.Schedule b4 you leave.  Lab/Order associations:   ICD-10-CM   1. Diabetes mellitus without complication (HCC)  E11.9     2. Atherosclerosis of native coronary artery of native heart with stable angina  pectoris (HCC)  I25.118     3. Essential hypertension  I10     4. Hypothyroidism, unspecified type  E03.9     5. Hyperlipidemia associated with type 2 diabetes mellitus (HCC)  E11.69    E78.5     6. Aortic atherosclerosis (HCC) Chronic I70.0       Meds ordered this encounter  Medications   nitroGLYCERIN (NITROSTAT) 0.4 MG SL tablet    Sig: Place 1 tablet (0.4 mg total) under the tongue every 5 (five) minutes as needed for chest pain (CP or SOB).    Dispense:  25 tablet    Refill:  3   insulin NPH-regular Human (70-30) 100 UNIT/ML injection    Sig: Inject 10-30 Units into the skin 2 (two) times daily with a meal. Start with 20 units am and 10 units in the PM and update me after a week    Dispense:  10 mL    Refill:  11    Return precautions advised.  Tana Conch, MD

## 2023-04-07 ENCOUNTER — Telehealth: Payer: Self-pay | Admitting: Cardiology

## 2023-04-07 NOTE — Telephone Encounter (Signed)
Returned pt call. Pt is complaining of left calf and foot swelling. Swelling has been present since first of May. He has a problem with his sciatic nerve. See's a Land as well. The leg is not hot to touch, not red, there is no pitting. Does elevate and it does help. Swelling is only after long periods. Advised pt that his concern will be sent to the provider for recommendations. Pt verbalized understanding.

## 2023-04-07 NOTE — Telephone Encounter (Signed)
Pt c/o swelling/edema: STAT if pt has developed SOB within 24 hours  If swelling, where is the swelling located?   Left calf and foot area  How much weight have you gained and in what time span?   No  Have you gained 2 pounds in a day or 5 pounds in a week?   No  Do you have a log of your daily weights (if so, list)?   No  Are you currently taking a fluid pill?   No  Are you currently SOB?  No  Have you traveled recently in a car or plane for an extended period of time? Yes - A few weeks ago patient travelled 200 miles by car and previously 400 miles by car  Patient stated he has been having problems with his sciatic nerve and is concerned as he is also having swelling mostly in his left calf and foot area.

## 2023-04-11 NOTE — Telephone Encounter (Signed)
Tried to reach patient. Unable to leave voicemail.

## 2023-04-17 NOTE — Telephone Encounter (Signed)
Spoke with pt, Follow up scheduled  

## 2023-04-17 NOTE — Progress Notes (Signed)
Cardiology Office Note:   Date:  04/18/2023  ID:  Gabriel [name], DOB 02-Mar-1966, MRN [id number] PCP: Shelva Majestic, MD  [facility] Providers Cardiologist:  Rollene Rotunda, MD {  History of Present Illness:   Gabriel [name] is a 57 y.o. male  who presents for follow up of CAD which is being managed medically.   He has a history of an LAD stenosis of 50% demonstrated in December 2014. He had a normal  FFR at that time.  He was in the emergency room however in 2017 with chest discomfort. He was seen in consultation. It was felt to be somewhat atypical pain. He had some chest discomfort with burping. It did not respond to treatment with an antacid. His cardiac markers were negative in the ER. He did report having used nitroglycerin about one month prior to this. He had a stress perfusion study which demonstrated an EF of 56%. There was a medium-sized moderate defect in the basal inferoseptal mid anteroseptal and mid inferoseptal apical region.  He had a cardiac cath on 09/28/15 and was found to have 40% LAD stenosis.   He is status post cardiac catheterization in 2021 with PCI to the LAD.  On  03/27/2021 he talked to Dr. Izora Ribas, via telemedicine visit. He had chest pain and a nuclear medicine stress test was ordered.  This was negative for ischemia in August 2022.     Since I last saw him he has had no new cardiovascular complaints.  He had seen Azalee Course  PA earlier this year and was having some chest discomfort and he was going to get an echocardiogram and a PET scan but he never presented for these.  He said actually he has been doing relatively well since then.  He has some right-sided discomfort that he thinks is related to emotional stress.  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  He is mostly limited by sciatica right now.  He is not really describing substernal chest discomfort, neck  or arm discomfort.  Is not having any new shortness of breath but has some chronic dyspnea.  Is not having any new PND or orthopnea.  He had no weight gain or edema.  ROS: As stated in the HPI and negative for all other systems.  Studies Reviewed:    EKG:   NA  Risk Assessment/Calculations:              Physical Exam:   VS:  BP 138/70 (BP Location: Left Arm, Patient Position: Sitting, Cuff Size: Large)   Pulse 86   Ht 5\' 9"  (1.753 m)   Wt (!) 318 lb 12.8 oz (144.6 kg)   SpO2 93%   BMI 47.08 kg/m    Wt Readings from Last 3 Encounters:  04/18/23 (!) 318 lb 12.8 oz (144.6 kg)  03/31/23 (!) 313 lb (142 kg)  01/23/23 (!) 312 lb 9.6 oz (141.8 kg)     GEN: Well nourished, well developed in no acute distress NECK: No JVD; No carotid bruits CARDIAC: RRR, no murmurs, rubs, gallops RESPIRATORY:  Clear to auscultation without rales, wheezing or rhonchi  ABDOMEN: Soft, non-tender, non-distended EXTREMITIES: Mild leg edema; No deformity   ASSESSMENT AND PLAN:   CAD:  The patient has no new sypmtoms.  No further cardiovascular testing is indicated.  We will continue with aggressive risk reduction and meds as listed.  Sleep apnea:    He is unable to afford the CPAP and did not present for any follow-up referrals with Dr. Mayford Knife.     Hypertension:  The blood pressure is at target.  No change in therapy.    Dyslipidemia:  LDL is at target.  No change in therapy.   Diabetes Mellitus:  A1c was up to 9.5 from 8.6.  I gave him an application for Ozempic and communicated with his primary doctor.   Obesity:   Morbid obesity is a life limiting issue here.  Hopefully he can qualify for some Ozempic assistance.        Follow up with Azalee Course in 12 months.    Signed, Rollene Rotunda, MD

## 2023-04-18 ENCOUNTER — Encounter: Payer: Self-pay | Admitting: Cardiology

## 2023-04-18 ENCOUNTER — Ambulatory Visit: Payer: Self-pay | Attending: Cardiology | Admitting: Cardiology

## 2023-04-18 VITALS — BP 138/70 | HR 86 | Ht 69.0 in | Wt 318.8 lb

## 2023-04-18 DIAGNOSIS — Z794 Long term (current) use of insulin: Secondary | ICD-10-CM

## 2023-04-18 DIAGNOSIS — E118 Type 2 diabetes mellitus with unspecified complications: Secondary | ICD-10-CM

## 2023-04-18 DIAGNOSIS — I2510 Atherosclerotic heart disease of native coronary artery without angina pectoris: Secondary | ICD-10-CM

## 2023-04-18 DIAGNOSIS — E785 Hyperlipidemia, unspecified: Secondary | ICD-10-CM

## 2023-04-18 DIAGNOSIS — I10 Essential (primary) hypertension: Secondary | ICD-10-CM

## 2023-04-18 NOTE — Patient Instructions (Signed)
Medication Instructions:  No Changes *If you need a refill on your cardiac medications before your next appointment, please call your pharmacy*   Lab Work: No Labs If you have labs (blood work) drawn today and your tests are completely normal, you will receive your results only by: MyChart Message (if you have MyChart) OR A paper copy in the mail If you have any lab test that is abnormal or we need to change your treatment, we will call you to review the results.   Testing/Procedures: No Testing   Follow-Up: At [facility], you and your health needs are our priority.  As part of our continuing mission to provide you with exceptional heart care, we have created designated Provider Care Teams.  These Care Teams include your primary Cardiologist (physician) and Advanced Practice Providers (APPs -  Physician Assistants and Nurse Practitioners) who all work together to provide you with the care you need, when you need it.  We recommend signing up for the patient portal called "MyChart".  Sign up information is provided on this After Visit Summary.  MyChart is used to connect with patients for Virtual Visits (Telemedicine).  Patients are able to view lab/test results, encounter notes, upcoming appointments, etc.  Non-urgent messages can be sent to your provider as well.   To learn more about what you can do with MyChart, go to [email or web address].    Your next appointment:   1 year(s)  Provider:   Azalee Course, PA-C

## 2023-04-25 ENCOUNTER — Telehealth: Payer: Self-pay | Admitting: Pharmacist

## 2023-04-25 NOTE — Telephone Encounter (Signed)
Ozempic patient assistance application sent to [employer]

## 2023-05-07 ENCOUNTER — Telehealth: Payer: Self-pay | Admitting: Pharmacist

## 2023-05-07 ENCOUNTER — Other Ambulatory Visit: Payer: Self-pay | Admitting: Pharmacist

## 2023-05-07 NOTE — Telephone Encounter (Signed)
Patient assistance Ozempic arrived from manufacturer. Contacted patient and he is aware.

## 2023-05-15 ENCOUNTER — Telehealth: Payer: Self-pay | Admitting: Pharmacist

## 2023-05-15 NOTE — Telephone Encounter (Signed)
Called patient back. Picked up Ozempic patient assistance but it did not have directions on it. Educated on dosing. 0.25mg  once weekly for 4 weeks and then 0.5mg  weekly. Patient has multiple 0.25/0.5mg  pens. Patient voiced understanding. Offered to teach patient how to administer in clinic if he still gets confused.

## 2023-05-15 NOTE — Telephone Encounter (Signed)
Patient is requesting to speak white Gabriel [name] in regards to [employer]

## 2023-05-16 ENCOUNTER — Other Ambulatory Visit: Payer: Self-pay | Admitting: Cardiology

## 2023-06-02 ENCOUNTER — Telehealth: Payer: Self-pay | Admitting: Cardiology

## 2023-06-02 NOTE — Telephone Encounter (Signed)
Patient is using Ozempic.  He states he is to use it once a week. I cannot understand if he is confused about how often to use  or how to administer. I ask again and he sates his BG is not lowering with the medication.He states he thought this was to replace to insulin.  Advised this does not reduce his BG,It may  reduce A1C over several months.  He states his BG is still high and I advise to call his PCP to discuss his BG readings.  Again discussed the Ozempic, dose and how often X 3 and he states understanding

## 2023-06-02 NOTE — Telephone Encounter (Signed)
Pt c/o medication issue:  1. Name of Medication: starts with an "O", but does not remember the name and does not have it near him   2. How are you currently taking this medication (dosage and times per day)? Injects it once a week  3. Are you having a reaction (difficulty breathing--STAT)? no  4. What is your medication issue? Patient states started an injectable medication, but is not sure if he is taking it correctly. He says the pen lasts a month and he did 2 clicks of it last week. He says he is not sure if he is taking enough of the medication. He states he is due for his next dose tomorrow.

## 2023-07-07 ENCOUNTER — Other Ambulatory Visit: Payer: Self-pay | Admitting: Family Medicine

## 2023-07-18 ENCOUNTER — Other Ambulatory Visit: Payer: Self-pay

## 2023-07-18 ENCOUNTER — Telehealth: Payer: Self-pay | Admitting: Family Medicine

## 2023-07-18 ENCOUNTER — Encounter (HOSPITAL_BASED_OUTPATIENT_CLINIC_OR_DEPARTMENT_OTHER): Payer: Self-pay | Admitting: Emergency Medicine

## 2023-07-18 DIAGNOSIS — R1031 Right lower quadrant pain: Secondary | ICD-10-CM | POA: Insufficient documentation

## 2023-07-18 DIAGNOSIS — Z794 Long term (current) use of insulin: Secondary | ICD-10-CM | POA: Insufficient documentation

## 2023-07-18 DIAGNOSIS — M545 Low back pain, unspecified: Secondary | ICD-10-CM | POA: Insufficient documentation

## 2023-07-18 DIAGNOSIS — Z7982 Long term (current) use of aspirin: Secondary | ICD-10-CM | POA: Insufficient documentation

## 2023-07-18 LAB — CBC
HCT: 43.1 % (ref 39.0–52.0)
Hemoglobin: 14.5 g/dL (ref 13.0–17.0)
MCH: 29.2 pg (ref 26.0–34.0)
MCHC: 33.6 g/dL (ref 30.0–36.0)
MCV: 86.7 fL (ref 80.0–100.0)
Platelets: 251 10*3/uL (ref 150–400)
RBC: 4.97 MIL/uL (ref 4.22–5.81)
RDW: 12.8 % (ref 11.5–15.5)
WBC: 7.1 10*3/uL (ref 4.0–10.5)
nRBC: 0 % (ref 0.0–0.2)

## 2023-07-18 LAB — COMPREHENSIVE METABOLIC PANEL
ALT: 62 U/L — ABNORMAL HIGH (ref 0–44)
AST: 46 U/L — ABNORMAL HIGH (ref 15–41)
Albumin: 4.7 g/dL (ref 3.5–5.0)
Alkaline Phosphatase: 67 U/L (ref 38–126)
Anion gap: 8 (ref 5–15)
BUN: 12 mg/dL (ref 6–20)
CO2: 29 mmol/L (ref 22–32)
Calcium: 9.9 mg/dL (ref 8.9–10.3)
Chloride: 101 mmol/L (ref 98–111)
Creatinine, Ser: 1.04 mg/dL (ref 0.61–1.24)
GFR, Estimated: >60 mL/min (ref >60)
Glucose, Bld: 160 mg/dL — ABNORMAL HIGH (ref 70–99)
Potassium: 4.1 mmol/L (ref 3.5–5.1)
Sodium: 138 mmol/L (ref 135–145)
Total Bilirubin: 0.9 mg/dL (ref <1.2)
Total Protein: 7.6 g/dL (ref 6.5–8.1)

## 2023-07-18 LAB — URINALYSIS, ROUTINE W REFLEX MICROSCOPIC
Bilirubin Urine: NEGATIVE
Glucose, UA: NEGATIVE mg/dL
Hgb urine dipstick: NEGATIVE
Ketones, ur: 15 mg/dL — AB
Leukocytes,Ua: NEGATIVE
Nitrite: NEGATIVE
Protein, ur: NEGATIVE mg/dL
Specific Gravity, Urine: 1.024 (ref 1.005–1.030)
pH: 5.5 (ref 5.0–8.0)

## 2023-07-18 LAB — LIPASE, BLOOD: Lipase: 48 U/L (ref 11–51)

## 2023-07-18 NOTE — Telephone Encounter (Signed)
FYI: This call has been transferred to triage nurse: Access Nurse. Once the result note has been entered staff can address the message at that time.  Patient called in with the following symptoms:  Red Word: Right sided abdominal pain and right sided back pain; hx of hiatal hernia    Please advise at Mobile [phone] (mobile)  Message is routed to Provider Pool.

## 2023-07-18 NOTE — ED Triage Notes (Signed)
Abdo pain/ flank pain Lower right side. Started yesterday. Laying down is more comfortable then sitting positions  Denies n/v. Denies issues voiding.  Reports having 2 hernias.  Gallbladder was removed 10 years ago

## 2023-07-18 NOTE — Telephone Encounter (Signed)
Called and LVM informing patient to call back for scheduling.     Patient Name First: Gabriel Last: [name] Gender: Male DOB: March 11, 1966 Age: 57 Y 2 M 26 D Return Phone Number: [phone] (Primary), [phone] (Secondary) Address: [place]  [address] Client [facility], [facility] Provider Tana Conch- MD Contact Type Call Who Is Calling Patient / Member / Family / Caregiver Call Type Triage / Clinical Relationship To Patient Self Return Phone Number [phone] (Primary) Chief Complaint Abdominal Pain Reason for Call Symptomatic / Request for Health Information Initial Comment Caller states he is having right sided abdominal pain and right sided back pain. Denies severe pain at this time. Describes it has discomfort. He has two hernias (one in the abdominal area and one in his groin area). Additional Comment Call was transferred from the office for triage. Translation No Disp. Time Lamount Cohen Time) Disposition Final User 07/18/2023 1:41:32 PM Attempt made - message left Annitta Needs 07/18/2023 1:59:19 PM Attempt made - message left Annitta Needs 07/18/2023 2:18:05 PM FINAL ATTEMPT MADE - message left Yes Lillia Abed RN, [facility] Final Disposition 07/18/2023 2:18:05 PM FINAL ATTEMPT MADE - message left Yes Lillia Abed RN, [facility]

## 2023-07-18 NOTE — Telephone Encounter (Signed)
Please schedule ov with available provider.

## 2023-07-19 ENCOUNTER — Emergency Department (HOSPITAL_BASED_OUTPATIENT_CLINIC_OR_DEPARTMENT_OTHER)
Admission: EM | Admit: 2023-07-19 | Discharge: 2023-07-19 | Disposition: A | Discharge status: Home / Self Care | Payer: Self-pay | Attending: Emergency Medicine | Admitting: Emergency Medicine

## 2023-07-19 ENCOUNTER — Emergency Department (HOSPITAL_BASED_OUTPATIENT_CLINIC_OR_DEPARTMENT_OTHER): Payer: Self-pay

## 2023-07-19 DIAGNOSIS — M545 Low back pain, unspecified: Secondary | ICD-10-CM

## 2023-07-19 DIAGNOSIS — R1031 Right lower quadrant pain: Secondary | ICD-10-CM

## 2023-07-19 NOTE — ED Provider Notes (Signed)
[facility]ERGENCY DEPARTMENT AT [facility] Provider Note   CSN: [id number] Arrival date & time: 07/18/23  2106     History  Chief Complaint  Patient presents with   Abdominal Pain    Gabriel [name] is a 57 y.o. male.  Patient presents with his girlfriend secondary to abdominal pain and back pain.  Patient states that his pain is sore and tight and almost feels like muscle spasms however he knows he has a hernia and some the pain is in his stomach so he went to come get checked out.  No nausea, vomiting, diarrhea, constipation, fever or other associated symptoms.  Hernias are stable for many years.  No radiation of symptoms anywhere else.  No sick contacts or abnormal food intake.   Abdominal Pain      Home Medications Prior to Admission medications   Medication Sig Start Date End Date Taking? Authorizing Provider  acetaminophen (TYLENOL) 500 MG tablet Take 1,000 mg by mouth every 6 (six) hours as needed for mild pain or moderate pain. Reported on 01/09/2016    [provider]  aspirin EC 81 MG EC tablet Take 1 tablet (81 mg total) by mouth daily. 08/10/13   Creig Hines, NP  glucose blood (ONE TOUCH TEST STRIPS) test strip Up to 4 times daily.  Onetouch Verio.  E11.65 01/09/16   Shelva Majestic, MD  insulin NPH-regular Human (70-30) 100 UNIT/ML injection Inject 10-30 Units into the skin 2 (two) times daily with a meal. Start with 20 units am and 10 units in the PM and update me after a week 03/31/23   Shelva Majestic, MD  Lancets [facility] ULTRASOFT) lancets Up to 4 times daily.  Onetouch Verio.  YSA63.01 01/09/16   Shelva Majestic, MD  levothyroxine (SYNTHROID) 125 MCG tablet Take 1 tablet (125 mcg total) by mouth daily before breakfast. 01/23/23   Shelva Majestic, MD  losartan (COZAAR) 100 MG tablet Take 1 tablet (100 mg total) by mouth daily. 05/16/23   Rollene Rotunda, MD  metFORMIN (GLUCOPHAGE) 1000 MG tablet TAKE 1 TABLET BY MOUTH TWICE  DAILY WITH A MEAL 07/07/23   Shelva Majestic, MD  Multiple Vitamins-Minerals (MULTIVITAMIN,TX-MINERALS) tablet Take 1 tablet by mouth daily.    [provider]  nitroGLYCERIN (NITROSTAT) 0.4 MG SL tablet Place 1 tablet (0.4 mg total) under the tongue every 5 (five) minutes as needed for chest pain (CP or SOB). 03/31/23   Shelva Majestic, MD  Omega-3 Fatty Acids (FISH OIL) 1000 MG CAPS Take 1,000 mg by mouth every other day.    [provider]  omeprazole (PRILOSEC) 20 MG capsule Take 1 capsule (20 mg total) by mouth daily. 08/27/19   Unk Lightning, PA  rosuvastatin (CRESTOR) 5 MG tablet Take 1 tablet (5 mg total) by mouth daily. 08/13/22   Shelva Majestic, MD  Semaglutide,0.25 or 0.5MG /DOS, (OZEMPIC, 0.25 OR 0.5 MG/DOSE,) 2 MG/3ML SOPN Inject 0.25mg  once weekly for 4 weeks and then increase to 0.5mg  weekly. Patient assistance. 05/07/23   Pavero, Cristal Deer, RPH  silodosin (RAPAFLO) 8 MG CAPS capsule Take 8 mg by mouth daily. 12/25/21   [provider]  tiZANidine (ZANAFLEX) 4 MG tablet Take 1 tablet (4 mg total) by mouth every 8 (eight) hours as needed for muscle spasms (do not drive for 8 hours after taking). 01/23/23   Shelva Majestic, MD      Allergies    Patient has no known allergies.  Review of Systems   Review of Systems  Gastrointestinal:  Positive for abdominal pain.    Physical Exam Updated Vital Signs BP (!) 111/50   Pulse 71   Temp 98.1 F (36.7 C) (Oral)   Resp 16   SpO2 90%  Physical Exam Vitals and nursing note reviewed.  Constitutional:      Appearance: He is well-developed.  HENT:     Head: Normocephalic and atraumatic.  Cardiovascular:     Rate and Rhythm: Normal rate.  Pulmonary:     Effort: Pulmonary effort is normal. No respiratory distress.  Abdominal:     General: There is no distension.     Tenderness: There is no abdominal tenderness.     Comments: I cannot confidently identify any hernias on exam.  May be  related to body habitus however patient is not able to find them to show to me either so I have low suspicion for incarceration.  Musculoskeletal:        General: Normal range of motion.     Cervical back: Normal range of motion.  Skin:    General: Skin is warm and dry.  Neurological:     Mental Status: He is alert.     ED Results / Procedures / Treatments   Labs (all labs ordered are listed, but only abnormal results are displayed) Labs Reviewed  COMPREHENSIVE METABOLIC PANEL - Abnormal; Notable for the following components:      Result Value   Glucose, Bld 160 (*)    AST 46 (*)    ALT 62 (*)    All other components within normal limits  URINALYSIS, ROUTINE W REFLEX MICROSCOPIC - Abnormal; Notable for the following components:   Ketones, ur 15 (*)    All other components within normal limits  LIPASE, BLOOD  CBC    EKG None  Radiology CT Renal Stone Study  Result Date: 07/19/2023 CLINICAL DATA:  57 year old male with abdomen and flank pain. EXAM: CT ABDOMEN AND PELVIS WITHOUT CONTRAST TECHNIQUE: Multidetector CT imaging of the abdomen and pelvis was performed following the standard protocol without IV contrast. RADIATION DOSE REDUCTION: This exam was performed according to the departmental dose-optimization program which includes automated exposure control, adjustment of the mA and/or kV according to patient size and/or use of iterative reconstruction technique. COMPARISON:  Noncontrast CT Abdomen and Pelvis 01/19/2018. FINDINGS: Lower chest: Negative. Hepatobiliary: Chronic cholecystectomy and hepatic steatosis. Pancreas: Negative. Spleen: Negative. Adrenals/Urinary Tract: Normal adrenal glands. Noncontrast kidneys appears stable and nonobstructed. Decompressed ureters to the bladder. Unremarkable bladder. No urinary calculus identified. Stomach/Bowel: Redundant but mostly decompressed large bowel. Decompressed terminal ileum. Diminutive appendix suspected on coronal image 80. No  dilated small bowel. 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 Negative noncontrast stomach and duodenum otherwise. No free air, free fluid, bowel herniation. Vascular/Lymphatic: Aortoiliac calcified atherosclerosis. Normal caliber abdominal aorta. Vascular patency is not evaluated in the absence of IV contrast. No lymphadenopathy identified. Reproductive: Small chronic fat containing left inguinal hernia is stable. Other: No pelvis free fluid. Musculoskeletal: Advanced lower lumbar disc and facet degeneration. Vacuum disc and vacuum facet arthropathy. No acute osseous abnormality identified. IMPRESSION: 1. No urinary calculus. No acute or inflammatory process identified in the noncontrast abdomen or pelvis. 2. Chronic cholecystectomy and hepatic steatosis. 3.  Aortic Atherosclerosis (ICD10-I70.0). Electronically Signed   By: Odessa Fleming M.D.   On: 07/19/2023 06:49    Procedures Procedures    Medications  Ordered in ED Medications - No data to display  ED Course/ Medical Decision Making/ A&P                                 Medical Decision Making Amount and/or Complexity of Data Reviewed Labs: ordered. Radiology: ordered.   Overall patient appears well, tolerating p.o.  Normal vital signs.  Workup here is negative for any significant abdominal pathology.  Patient sleeping comfortably without medication.  He does state that has been helping his father move recently and have him help him lift a lot of stuff and also lifting his father because he falls often and wonders if maybe he sprained something which is possible.  Does not seem like it is neurologic in nature did require any imaging of his spine.  Will follow-up with PCP if not proving with 3 to 5 days of conservative treatment.   Final Clinical Impression(s) / ED Diagnoses Final diagnoses:  Acute right-sided low back pain without sciatica  Right lower quadrant abdominal pain     Rx / DC Orders ED Discharge Orders     None         Riannah Stagner, Barbara Cower, MD 07/19/23 2359

## 2023-08-18 ENCOUNTER — Encounter: Payer: Self-pay | Admitting: Gastroenterology

## 2023-08-19 ENCOUNTER — Encounter: Payer: Self-pay | Admitting: Family Medicine

## 2023-08-19 ENCOUNTER — Ambulatory Visit (INDEPENDENT_AMBULATORY_CARE_PROVIDER_SITE_OTHER): Payer: Self-pay | Admitting: Family Medicine

## 2023-08-19 VITALS — BP 130/62 | HR 68 | Temp 98.0°F | Ht 69.0 in | Wt 308.6 lb

## 2023-08-19 DIAGNOSIS — J329 Chronic sinusitis, unspecified: Secondary | ICD-10-CM

## 2023-08-19 DIAGNOSIS — B9689 Other specified bacterial agents as the cause of diseases classified elsewhere: Secondary | ICD-10-CM

## 2023-08-19 DIAGNOSIS — Z794 Long term (current) use of insulin: Secondary | ICD-10-CM

## 2023-08-19 DIAGNOSIS — E119 Type 2 diabetes mellitus without complications: Secondary | ICD-10-CM

## 2023-08-19 DIAGNOSIS — N3090 Cystitis, unspecified without hematuria: Secondary | ICD-10-CM

## 2023-08-19 DIAGNOSIS — E039 Hypothyroidism, unspecified: Secondary | ICD-10-CM

## 2023-08-19 DIAGNOSIS — Z7984 Long term (current) use of oral hypoglycemic drugs: Secondary | ICD-10-CM

## 2023-08-19 DIAGNOSIS — R3915 Urgency of urination: Secondary | ICD-10-CM

## 2023-08-19 DIAGNOSIS — R319 Hematuria, unspecified: Secondary | ICD-10-CM

## 2023-08-19 LAB — CBC WITH DIFFERENTIAL/PLATELET
Basophils Absolute: 0 10*3/uL (ref 0.0–0.1)
Basophils Relative: 0.3 % (ref 0.0–3.0)
Eosinophils Absolute: 0.2 10*3/uL (ref 0.0–0.7)
Eosinophils Relative: 1.4 % (ref 0.0–5.0)
HCT: 39.7 % (ref 39.0–52.0)
Hemoglobin: 13.1 g/dL (ref 13.0–17.0)
Lymphocytes Relative: 18.1 % (ref 12.0–46.0)
Lymphs Abs: 2 10*3/uL (ref 0.7–4.0)
MCHC: 33.1 g/dL (ref 30.0–36.0)
MCV: 87.9 fL (ref 78.0–100.0)
Monocytes Absolute: 0.7 10*3/uL (ref 0.1–1.0)
Monocytes Relative: 6.3 % (ref 3.0–12.0)
Neutro Abs: 8.3 10*3/uL — ABNORMAL HIGH (ref 1.4–7.7)
Neutrophils Relative %: 73.9 % (ref 43.0–77.0)
Platelets: 206 10*3/uL (ref 150.0–400.0)
RBC: 4.52 Mil/uL (ref 4.22–5.81)
RDW: 13.2 % (ref 11.5–15.5)
WBC: 11.2 10*3/uL — ABNORMAL HIGH (ref 4.0–10.5)

## 2023-08-19 LAB — POC URINALSYSI DIPSTICK (AUTOMATED)
Bilirubin, UA: NEGATIVE
Blood, UA: POSITIVE
Glucose, UA: NEGATIVE
Ketones, UA: POSITIVE
Nitrite, UA: POSITIVE
Protein, UA: POSITIVE — AB
Spec Grav, UA: >=1.03 — AB (ref 1.010–1.025)
Urobilinogen, UA: 0.2 U/dL
pH, UA: 5.5 (ref 5.0–8.0)

## 2023-08-19 LAB — COMPREHENSIVE METABOLIC PANEL
ALT: 37 U/L (ref 0–53)
AST: 20 U/L (ref 0–37)
Albumin: 3.9 g/dL (ref 3.5–5.2)
Alkaline Phosphatase: 82 U/L (ref 39–117)
BUN: 11 mg/dL (ref 6–23)
CO2: 26 meq/L (ref 19–32)
Calcium: 8.6 mg/dL (ref 8.4–10.5)
Chloride: 99 meq/L (ref 96–112)
Creatinine, Ser: 0.9 mg/dL (ref 0.40–1.50)
GFR: 95.6 mL/min (ref >60.00)
Glucose, Bld: 179 mg/dL — ABNORMAL HIGH (ref 70–99)
Potassium: 3.3 meq/L — ABNORMAL LOW (ref 3.5–5.1)
Sodium: 135 meq/L (ref 135–145)
Total Bilirubin: 1.4 mg/dL — ABNORMAL HIGH (ref 0.2–1.2)
Total Protein: 7 g/dL (ref 6.0–8.3)

## 2023-08-19 LAB — HEMOGLOBIN A1C: Hgb A1c MFr Bld: 9.9 % — ABNORMAL HIGH (ref 4.6–6.5)

## 2023-08-19 LAB — TSH: TSH: 1.8 u[IU]/mL (ref 0.35–5.50)

## 2023-08-19 LAB — URINALYSIS, MICROSCOPIC ONLY

## 2023-08-19 MED ORDER — TAMSULOSIN HCL 0.4 MG PO CAPS: 0.4000 mg | ORAL_CAPSULE | Freq: Every day | ORAL | 3 refills | Status: DC

## 2023-08-19 MED ORDER — AMOXICILLIN-POT CLAVULANATE 875-125 MG PO TABS: 1.0000 | ORAL_TABLET | Freq: Two times a day (BID) | ORAL | 0 refills | Status: AC

## 2023-08-19 NOTE — Progress Notes (Signed)
Phone [phone] In person visit   Subjective:   Gabriel [name] is a 57 y.o. year old very pleasant male patient who presents for/with See problem oriented charting Chief Complaint  Patient presents with   decreased urine    Pt c/o decreased urine that he noticed starting Sunday. He went a lot but only a little or dribble will come out. Pt c/o burning on tip of penis.    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    Priority: Medium    Morbid obesity (HCC) 08/10/2013    Priority: Medium    Essential hypertension 02/06/2011    Priority: Medium    Hypothyroidism 02/06/2011    Priority: Medium    Hyperlipidemia associated with type 2 diabetes mellitus (HCC) 02/06/2011    Priority: Medium    Colon cancer screening 10/27/2018    Priority: Low   Midepigastric pain 09/27/2018    Priority: Low   Change in bowel habits 09/27/2018    Priority: Low   Family hx colonic polyps 09/27/2018    Priority: Low   BRBPR (bright red blood per rectum) 09/27/2018    Priority: Low   Left lateral epicondylitis 03/18/2016    Priority: Low   GERD (gastroesophageal reflux disease) 02/01/2015    Priority: Low   Dysphagia 09/30/2013    Priority: Low   Aortic atherosclerosis (HCC) 01/30/2023   Dyslipidemia 09/30/2021   Myalgia 03/27/2021   Chest pain of uncertain etiology 10/03/2019    Medications- reviewed and updated Current Outpatient Medications  Medication Sig Dispense Refill   acetaminophen (TYLENOL) 500 MG tablet Take 1,000 mg by mouth every 6 (six) hours as needed for mild pain or moderate pain. Reported on 01/09/2016     amoxicillin-clavulanate (AUGMENTIN) 875-125 MG tablet Take 1 tablet by mouth 2 (two) times daily for 7 days. 14 tablet 0   aspirin EC 81 MG EC tablet Take 1  tablet (81 mg total) by mouth daily.     glucose blood (ONE TOUCH TEST STRIPS) test strip Up to 4 times daily.  Onetouch Verio.  E11.65 100 each 12   insulin NPH-regular Human (70-30) 100 UNIT/ML injection Inject 10-30 Units into the skin 2 (two) times daily with a meal. Start with 20 units am and 10 units in the PM and update me after a week 10 mL 11   Lancets (ONETOUCH ULTRASOFT) lancets Up to 4 times daily.  Onetouch Verio.  DxE11.65 100 each 12   levothyroxine (SYNTHROID) 125 MCG tablet Take 1 tablet (125 mcg total) by mouth daily before breakfast. 90 tablet 3   losartan (COZAAR) 100 MG tablet Take 1 tablet (100 mg total) by mouth daily. 90 tablet 3   metFORMIN (GLUCOPHAGE) 1000 MG tablet TAKE 1 TABLET BY MOUTH TWICE DAILY WITH A MEAL 180 tablet 0   Multiple Vitamins-Minerals (MULTIVITAMIN,TX-MINERALS) tablet Take 1 tablet by mouth daily.     nitroGLYCERIN (NITROSTAT) 0.4 MG SL tablet Place 1 tablet (0.4 mg total) under the tongue every 5 (five) minutes as needed for chest pain (CP or SOB). 25 tablet 3   Omega-3 Fatty Acids (FISH OIL) 1000 MG CAPS Take 1,000 mg by mouth every other day.     omeprazole (PRILOSEC) 20 MG capsule Take 1 capsule (20 mg total) by mouth daily.  30 capsule 3   rosuvastatin (CRESTOR) 5 MG tablet Take 1 tablet (5 mg total) by mouth daily. 90 tablet 3   Semaglutide,0.25 or 0.5MG /DOS, (OZEMPIC, 0.25 OR 0.5 MG/DOSE,) 2 MG/3ML SOPN Inject 0.25mg  once weekly for 4 weeks and then increase to 0.5mg  weekly. Patient assistance.     silodosin (RAPAFLO) 8 MG CAPS capsule Take 8 mg by mouth daily.     tiZANidine (ZANAFLEX) 4 MG tablet Take 1 tablet (4 mg total) by mouth every 8 (eight) hours as needed for muscle spasms (do not drive for 8 hours after taking). 30 tablet 0   No current facility-administered medications for this visit.     Objective:  BP 130/62   Pulse 68   Temp 98 F (36.7 C)   Ht 5\' 9"  (1.753 m)   Wt (!) 308 lb 9.6 oz (140 kg)   SpO2 98%   BMI 45.57 kg/m   Gen: NAD, resting comfortably Tympanic membrane normal, orophaynx with mild erythema and signs of drainage, nasal turbinates erythematous and swollen- runny on right and dried with mixed blood on left CV: RRR no murmurs rubs or gallops Lungs: CTAB no crackles, wheeze, rhonchi Abdomen: soft/nontender/nondistended/normal bowel sounds. No rebound or guarding.  Ext: no edema Skin: warm, dry Results for orders placed or performed in visit on 08/19/23 (from the past 24 hour(s))  POCT Urinalysis Dipstick (Automated)     Status: Abnormal   Collection Time: 08/19/23 10:33 AM  Result Value Ref Range   Color, UA yellow    Clarity, UA clear    Glucose, UA Negative Negative   Bilirubin, UA neg    Ketones, UA pos    Spec Grav, UA >=1.030 (A) 1.010 - 1.025   Blood, UA pos    pH, UA 5.5 5.0 - 8.0   Protein, UA Positive (A) Negative   Urobilinogen, UA 0.2 0.2 or 1.0 E.U./dL   Nitrite, UA pos    Leukocytes, UA Trace (A) Negative       Assessment and Plan   # Decreased urine output/dysuria #sinus pressure and cogestion for 2 weeks S: Patients symptoms started  on sunday.  Complains of dysuria: YES; polyuria: YES; nocturia: More than usual; urgency: YES. Dribbling noted- needs peripheral arterial disease at night  Symptoms are stable to mildly improved. Has started drinking much water  Trying to drink Gatorade 0 - seen in Emergency Department 07/19/23 for abdominal and flank pain- renal stone study CT with no stones thankfully. Did show fatty liver, plaque on aorta and status post cholecystectomy. Overall improved but some pain on right side. Creatinine was normal and CBC normal. LFT mild elevations.  Stomach has been better but he has also had cold for 2 weeks. Was taking mucinex but stopped Sunday and that's when symptomsstarted  ROS- no fever, chills, nausea (other than once Sunday), vomiting, flank pain. No blood in urine.  -is having some diarrhea with this as well A/P: UA concerning for  urinary tract infection and dehydration. Likely urinary tract infection (no prostatitis on exam) Will get culture. Empiric treatment with: Augmentin to also cover for sinusitis (lungs clear) Patient to follow up if new or worsening symptoms or failure to improve.  -long term girlfriend- no concern for sexual transmitted infection . No discharge from penis - get urine micro with possible hematuria  -declines sexual transmitted infection evaluation- states very low risk- girlfriend for 3 years  # Diabetes with morbid obesity S: Medication: Glimepiride 8 mg, insulin NPH 70/30 twice daily  15 units in the morning, 15 units in the p.m., metformin 1000 mg twice daily -190 in the mornings. No lows Lab Results  Component Value Date   HGBA1C 9.5 (H) 11/19/2022   HGBA1C 8.6 (A) 08/13/2022   HGBA1C 9.9 (H) 04/09/2022  A/P: hopefully improved- update a1c- suspect will need to increase insulin - hold metformin for a week to see if helps with diarrhea  #hypertension S: medication: Losartan 100 mg BP Readings from Last 3 Encounters:  08/19/23 130/62  07/19/23 (!) 111/50  04/18/23 138/70  A/P: stable- continue current medicines   #hypothyroidism S: compliant On thyroid medication-levothyroxine 125 mcg Lab Results  Component Value Date   TSH 2.68 11/19/2022  A/P:hopefully stable- update tsh today. Continue current meds for now     # BPH-on Rapaflo 8 mg in past but came off- may have predisposed to urinary tract infection - will restart Flomax due to price     Recommended follow up: Return in about 14 weeks (around 11/25/2023) for followup or sooner if needed.Schedule b4 you leave.  Lab/Order associations:   ICD-10-CM   1. Urinary urgency  R39.15 POCT Urinalysis Dipstick (Automated)    Urine Culture    CANCELED: Urine Culture    2. Hematuria, unspecified type  R31.9 Urine Microscopic    3. Diabetes mellitus without complication (HCC)  E11.9 Hemoglobin A1c    Comprehensive metabolic panel     CBC with Differential/Platelet    4. Hypothyroidism, unspecified type  E03.9 TSH      Meds ordered this encounter  Medications   amoxicillin-clavulanate (AUGMENTIN) 875-125 MG tablet    Sig: Take 1 tablet by mouth 2 (two) times daily for 7 days.    Dispense:  14 tablet    Refill:  0    Return precautions advised.  Tana Conch, MD

## 2023-08-19 NOTE — Patient Instructions (Addendum)
Get diabetic eye exam scheduled.  Please stop by lab before you go If you have mychart- we will send your results within 3 business days of [place] receiving them.  If you do not have mychart- we will call you about results within 5 business days of [place] receiving them.  *please also note that you will see labs on mychart as soon as they post. I will later go in and write notes on them- will say "notes from Dr. Durene Cal"   Start Augmentin for the next 7 days-this should cover your sinusitis and I am hopeful we will also cover UTI-we will know within a couple of days whether this was the right antibiotic choice when the urine culture comes back-if you have new or worsening symptoms please let me know ASAP -No obvious prostate infection on exam thankfully -With no heart failure I want you to push fluids-water in particular -Are you still on the Rapaflo? NO- start Flomax 0.4 mg instead each night  Recommended follow up: Return in about 14 weeks (around 11/25/2023) for followup or sooner if needed.Schedule b4 you leave.  -Definitely sooner if new or worsening symptoms or failure to improve

## 2023-08-20 ENCOUNTER — Telehealth: Payer: Self-pay

## 2023-08-20 NOTE — Telephone Encounter (Addendum)
Left voice message asking patient to give office a call back. Will try calling again.   ----- Message from Tana Conch sent at 08/20/2023  1:06 PM EST ----- Notes from Dr. Durene Cal:  Diabetes remains very poorly controlled-please increase insulin to 18 units in the morning and continue 15 units in the evening and update me in 2 weeks with home readings-I would also certainly be open to referring you to endocrinology but I know cost is a concern.  We just have not been able to gain any traction to get this down substantially.  I really think you would benefit from a medication like Ozempic for weight loss or Mounjaro but once again I know cost is a big barrier  Your CBC was largely normal (blood counts, infection fighting cells, platelets).  White blood count very slightly high-I suspect infection could be the cause-urine remains very concerning for infection-still pending culture.  Possible blood in the urine-we will need to recheck when you are not having symptoms  Your CMET was largely normal (kidney, liver, and electrolytes, blood sugar).  Potassium slightly low-hopefully have an extra banana or 2 when you receive this.  Bilirubin here hide-continue to monitor-previously normal.  Other 2 liver function test have improved from a month ago though. Your thyroid (tsh) is normal

## 2023-08-20 NOTE — Telephone Encounter (Signed)
Patient called back and requests call back.

## 2023-08-21 LAB — URINE CULTURE
MICRO NUMBER:: 15831786
SPECIMEN QUALITY:: ADEQUATE

## 2023-08-22 NOTE — Telephone Encounter (Signed)
Pt would like a call back with lab results 

## 2023-08-22 NOTE — Telephone Encounter (Signed)
Returned pt call and labs reviewed.  

## 2023-09-17 ENCOUNTER — Other Ambulatory Visit: Payer: Self-pay | Admitting: Family Medicine

## 2023-09-24 ENCOUNTER — Other Ambulatory Visit: Payer: Self-pay | Admitting: Family Medicine

## 2023-10-11 ENCOUNTER — Other Ambulatory Visit: Payer: Self-pay | Admitting: Family Medicine

## 2023-10-15 ENCOUNTER — Telehealth: Payer: Self-pay | Admitting: Cardiology

## 2023-10-15 NOTE — Telephone Encounter (Signed)
 Pt c/o Shortness Of Breath: STAT if SOB developed within the last 24 hours or pt is noticeably SOB on the phone  1. Are you currently SOB (can you hear that pt is SOB on the phone)? Yes   2. How long have you been experiencing SOB? This week   3. Are you SOB when sitting or when up moving around? Both   4. Are you currently experiencing any other symptoms? Chest discomfort, headache

## 2023-10-15 NOTE — Telephone Encounter (Signed)
 Received call from patient who states took nitroglycerin  3 times last night for chest pain without relief and has now taken 2 in the last 10 minutes and still not completely relieved. Is audibly short of breath on the phone and states that since early yesterday morning both legs/feet have been more swollen than normal.  Advised patient for future reference if he doesn't have relief of chest pain after the 3rd nitroglycerin  we always recommend being evaluated by a higher level of care. Asked is he had someone that could drive him to the ER or if he needed to call 911. Patient states he will call a friend and go to the ER right now.  Asked to please keep us  updated on his situation.

## 2023-10-17 ENCOUNTER — Other Ambulatory Visit: Payer: Self-pay | Admitting: Family Medicine

## 2023-11-07 ENCOUNTER — Ambulatory Visit: Payer: Self-pay | Admitting: Family Medicine

## 2023-11-07 NOTE — Telephone Encounter (Signed)
  Chief Complaint: R side pain Symptoms: pain radiates to the back Frequency: ongoing intermittently for 2-3 weeks Pertinent Negatives: Patient denies fever, N/V/D, GU s/s, GI s/s, SOB, difficulty breathing, fever  Disposition: [] ED /[] Urgent Care (no appt availability in office) / [x] Appointment(In office/virtual)/ []  [facility]/ [] Home Care/ [] Refused Recommended Disposition /[] [facility]/ []  Follow-up with PCP  Additional Notes: Pt states this has been ongoing for a few weeks. States he feels movement makes it better. Pt confirms no changes in GU nor GI. States that it comes and goes. Denies SOB. Sched with prov in PCP office.  Copied from CRM [id number]. Topic: Clinical - Red Word Triage >> Nov 07, 2023  4:05 PM Chantha C wrote: [facility] that prompted transfer to Nurse Triage: Patient states fatigue/weak, issue with the mid right back, right sides and legs sharp pain and then dull pain, right chest gets discomfort in there also, unsure if he has kidney stones. Patient denies fever, vision issues, light headed, nor dizziness. Please advise [phone]. Reason for Disposition  Abdominal pain is a chronic symptom (recurrent or ongoing AND present > 4 weeks)  Answer Assessment - Initial Assessment Questions 1. LOCATION: "Where does it hurt?"      R side 2. RADIATION: "Does the pain shoot anywhere else?" (e.g., chest, back)     Radiates into back 3. ONSET: "When did the pain begin?" (Minutes, hours or days ago)      Intermittently for last few weeks 4. SUDDEN: "Gradual or sudden onset?"     gradual 5. PATTERN "Does the pain come and go, or is it constant?"    - If it comes and goes: "How long does it last?" "Do you have pain now?"     (Note: Comes and goes means the pain is intermittent. It goes away completely between bouts.)    - If constant: "Is it getting better, staying the same, or getting worse?"      (Note: Constant means the pain never goes away completely;  most serious pain is constant and gets worse.)      Intermittent,  6. SEVERITY: "How bad is the pain?"  (e.g., Scale 1-10; mild, moderate, or severe)    - MILD (1-3): Doesn't interfere with normal activities, abdomen soft and not tender to touch.     - MODERATE (4-7): Interferes with normal activities or awakens from sleep, abdomen tender to touch.     - SEVERE (8-10): Excruciating pain, doubled over, unable to do any normal activities.       2-3 7. RECURRENT SYMPTOM: "Have you ever had this type of stomach pain before?" If Yes, ask: "When was the last time?" and "What happened that time?"      Had GB out and this felt similar 8. CAUSE: "What do you think is causing the stomach pain?"     unsure 9. RELIEVING/AGGRAVATING FACTORS: "What makes it better or worse?" (e.g., antacids, bending or twisting motion, bowel movement)     Movement makes it better 10. OTHER SYMPTOMS: "Do you have any other symptoms?" (e.g., back pain, diarrhea, fever, urination pain, vomiting)       denies  Protocols used: Abdominal Pain - Male-A-AH

## 2023-11-10 ENCOUNTER — Ambulatory Visit: Payer: Self-pay

## 2023-11-10 ENCOUNTER — Ambulatory Visit (INDEPENDENT_AMBULATORY_CARE_PROVIDER_SITE_OTHER): Payer: Self-pay | Admitting: Physician Assistant

## 2023-11-10 VITALS — BP 132/64 | HR 85 | Temp 97.9°F | Ht 69.0 in | Wt 316.5 lb

## 2023-11-10 DIAGNOSIS — R109 Unspecified abdominal pain: Secondary | ICD-10-CM

## 2023-11-10 DIAGNOSIS — R079 Chest pain, unspecified: Secondary | ICD-10-CM

## 2023-11-10 DIAGNOSIS — R10A0 Flank pain, unspecified side: Secondary | ICD-10-CM

## 2023-11-10 LAB — POC URINALSYSI DIPSTICK (AUTOMATED)
Bilirubin, UA: NEGATIVE
Blood, UA: NEGATIVE
Glucose, UA: NEGATIVE
Ketones, UA: NEGATIVE
Leukocytes, UA: NEGATIVE
Nitrite, UA: NEGATIVE
Protein, UA: NEGATIVE
Spec Grav, UA: >=1.03 — AB (ref 1.010–1.025)
Urobilinogen, UA: 0.2 U/dL
pH, UA: 5.5 (ref 5.0–8.0)

## 2023-11-10 NOTE — Progress Notes (Signed)
 Gabriel [name] is a 58 y.o. male here for a new problem.  History of Present Illness:   Chief Complaint  Patient presents with   Abdominal Pain    Pt c/o right side abdominal pain at rib area radiates to his back, x 3 weeks off and on. Denies fever or chills, no nausea.   Abdominal Pain  He recalls contacting cardiology in early February for chest pain/ heartburn in which they recommended to go the ED for. Patient did not present to the ED as he states taking Nitroglycerin which relieved his symptoms. At that time he also complained of SOB but attributes that to a certain activity he was doing then.  Patient currently complains of right sided abdominal pain which tends to radiate to his back and epigastric region. Symptoms have persisted for the past 3 weeks and tends to waxe and wane in severity. Worsening for the past 3-5 days.  He reports compliance and good tolerance of omeprazole 20 mg daily, states that he's unable to skip a dose as he would experience breakthrough symptoms.  Denies any fever, chills, nausea, or SOB.    Patient does report a history of Kidney stone that occurred about 5 years ago. Stone never passed back then. Current symptoms are somewhat similar to previous ones.   In Nov 2024 he went to the ER for very similar issues and had negative CT scan at that time   Past Medical History:  Diagnosis Date   Arthritis    "joints; from where I've had surgeries" (08/17/2013)   Chest pain    a. 08/2013 [facility] CTA: Ca score 238 (95%), LM nl, LAD mod stenosis in D2 area, D1 mild ost stenosis, D2 no signif dzs, LCX small, RCA nl.   Coronary artery disease    LHC (08/18/13):  pLAD 50, CFX and RCA normal.  EF 55-65%.  LAD lesion FFR:  0.88 (normal).  Med rx recommended.     Dysphagia    Upper endoscopy 11/2013 - without obvious stricture s/p Maloney dilation.    GERD (gastroesophageal reflux disease)    Hyperlipidemia    a. Dx 3y ago - not on statin.   Hypertension    a. Dx  3y ago.   Hypothyroidism    a. on replacement.   IBS (irritable bowel syndrome)    colonoscopy 2000   Obesity    Sleep apnea    does not use CPAP   Snoring    Type II diabetes mellitus (HCC)      Social History   Tobacco Use   Smoking status: Former    Current packs/day: 0.00    Average packs/day: 1.5 packs/day for 24.0 years (36.0 ttl pk-yrs)    Types: Cigarettes    Start date: 09/10/1983    Quit date: 09/10/2007    Years since quitting: 16.1   Smokeless tobacco: Never  Vaping Use   Vaping status: Never Used  Substance Use Topics   Alcohol use: No   Drug use: No    Past Surgical History:  Procedure Laterality Date   ANKLE SURGERY Right    "had a growth in it; cut it out" (08/17/2013)   CARDIAC CATHETERIZATION N/A 09/28/2015   Procedure: Left Heart Cath and Coronary Angiography;  Surgeon: Peter M Swaziland, MD;  Location: [facility] INVASIVE CV LAB;  Service: Cardiovascular;  Laterality: N/A;   CHOLECYSTECTOMY     CORONARY PRESSURE/FFR STUDY N/A 10/04/2019   Procedure: INTRAVASCULAR PRESSURE WIRE/FFR STUDY;  Surgeon: Corky Crafts,  MD;  Location: [facility] INVASIVE CV LAB;  Service: Cardiovascular;  Laterality: N/A;   CORONARY STENT INTERVENTION N/A 10/04/2019   Procedure: CORONARY STENT INTERVENTION;  Surgeon: Corky Crafts, MD;  Location: [facility] INVASIVE CV LAB;  Service: Cardiovascular;  Laterality: N/A;   KNEE ARTHROSCOPY Right X 2   LEFT HEART CATH AND CORONARY ANGIOGRAPHY N/A 10/04/2019   Procedure: LEFT HEART CATH AND CORONARY ANGIOGRAPHY;  Surgeon: Corky Crafts, MD;  Location: [facility] INVASIVE CV LAB;  Service: Cardiovascular;  Laterality: N/A;   LEFT HEART CATHETERIZATION WITH CORONARY ANGIOGRAM N/A 08/18/2013   Procedure: LEFT HEART CATHETERIZATION WITH CORONARY ANGIOGRAM;  Surgeon: Peter M Swaziland, MD;  Location: [facility] CATH LAB;  Service: Cardiovascular;  Laterality: N/A;   SHOULDER ARTHROSCOPY W/ ROTATOR CUFF REPAIR Left     Family History  Adopted: Yes  Problem Relation  Age of Onset   Hypertension Mother    Obesity Mother    Diabetes Mother    Liver cancer Mother        with mets to the GB   Coronary artery disease Father        s/p MI in his early 30's->CABG x 5 @ age 83, currently 61.   Diabetes Father    Stroke Father    Hypertension Father    Diabetes Brother    Obesity Brother    Asthma Maternal Grandfather    Colon cancer Neg Hx    Esophageal cancer Neg Hx    Inflammatory bowel disease Neg Hx    Liver disease Neg Hx    Pancreatic cancer Neg Hx    Rectal cancer Neg Hx    Stomach cancer Neg Hx     No Known Allergies  Current Medications:   Current Outpatient Medications:    acetaminophen (TYLENOL) 500 MG tablet, Take 1,000 mg by mouth every 6 (six) hours as needed for mild pain or moderate pain. Reported on 01/09/2016, Disp: , Rfl:    aspirin EC 81 MG EC tablet, Take 1 tablet (81 mg total) by mouth daily., Disp: , Rfl:    glimepiride (AMARYL) 4 MG tablet, TAKE 2 TABLETS BY MOUTH ONCE DAILY BEFORE BREAKFAST, Disp: 180 tablet, Rfl: 0   glucose blood (ONE TOUCH TEST STRIPS) test strip, Up to 4 times daily.  Onetouch Verio.  E11.65, Disp: 100 each, Rfl: 12   insulin NPH-regular Human (70-30) 100 UNIT/ML injection, Inject 10-30 Units into the skin 2 (two) times daily with a meal. Start with 20 units am and 10 units in the PM and update me after a week, Disp: 10 mL, Rfl: 11   Lancets (ONETOUCH ULTRASOFT) lancets, Up to 4 times daily.  Onetouch Verio.  DxE11.65, Disp: 100 each, Rfl: 12   levothyroxine (SYNTHROID) 125 MCG tablet, Take 1 tablet (125 mcg total) by mouth daily before breakfast., Disp: 90 tablet, Rfl: 3   losartan (COZAAR) 100 MG tablet, Take 1 tablet (100 mg total) by mouth daily., Disp: 90 tablet, Rfl: 3   metFORMIN (GLUCOPHAGE) 1000 MG tablet, TAKE 1 TABLET BY MOUTH TWICE DAILY WITH A MEAL, Disp: 180 tablet, Rfl: 0   Multiple Vitamins-Minerals (MULTIVITAMIN,TX-MINERALS) tablet, Take 1 tablet by mouth daily., Disp: , Rfl:     nitroGLYCERIN (NITROSTAT) 0.4 MG SL tablet, Place 1 tablet (0.4 mg total) under the tongue every 5 (five) minutes as needed for chest pain (CP or SOB)., Disp: 25 tablet, Rfl: 3   Omega-3 Fatty Acids (FISH OIL) 1000 MG CAPS, Take 1,000 mg by mouth every other  day., Disp: , Rfl:    omeprazole (PRILOSEC) 20 MG capsule, Take 1 capsule (20 mg total) by mouth daily., Disp: 30 capsule, Rfl: 3   rosuvastatin (CRESTOR) 5 MG tablet, Take 1 tablet by mouth once daily, Disp: 90 tablet, Rfl: 0   Semaglutide,0.25 or 0.5MG /DOS, (OZEMPIC, 0.25 OR 0.5 MG/DOSE,) 2 MG/3ML SOPN, 0.25 mg x 4 weeks, then increase to 0.5 mg weekly., Disp: , Rfl:    tamsulosin (FLOMAX) 0.4 MG CAPS capsule, Take 1 capsule (0.4 mg total) by mouth daily., Disp: 30 capsule, Rfl: 3   tiZANidine (ZANAFLEX) 4 MG tablet, Take 1 tablet (4 mg total) by mouth every 8 (eight) hours as needed for muscle spasms (do not drive for 8 hours after taking)., Disp: 30 tablet, Rfl: 0   Review of Systems:   Review of Systems  Constitutional:  Negative for chills and fever.  Respiratory:  Negative for shortness of breath.   Cardiovascular:  Positive for chest pain.  Gastrointestinal:  Positive for abdominal pain and heartburn. Negative for nausea and vomiting.    Vitals:   Vitals:   11/10/23 1529 11/10/23 1615  BP: (!) 150/60 132/64  Pulse: 85   Temp: 97.9 F (36.6 C)   TempSrc: Temporal   SpO2: 95%   Weight: (!) 316 lb 8 oz (143.6 kg)   Height: 5\' 9"  (1.753 m)      Body mass index is 46.74 kg/m.  Physical Exam:   Physical Exam Vitals and nursing note reviewed.  Constitutional:      General: He is not in acute distress.    Appearance: He is well-developed. He is not ill-appearing or toxic-appearing.  Cardiovascular:     Rate and Rhythm: Normal rate and regular rhythm.     Pulses: Normal pulses.     Heart sounds: Normal heart sounds, S1 normal and S2 normal.  Pulmonary:     Effort: Pulmonary effort is normal.     Breath sounds: Normal  breath sounds.  Abdominal:     General: Abdomen is flat. Bowel sounds are normal.     Palpations: Abdomen is soft.     Tenderness: There is no abdominal tenderness. There is no right CVA tenderness or left CVA tenderness.     Comments: Tenderness to palpation to right lateral inferior rib cage  Skin:    General: Skin is warm and dry.  Neurological:     Mental Status: He is alert.     GCS: GCS eye subscore is 4. GCS verbal subscore is 5. GCS motor subscore is 6.  Psychiatric:        Speech: Speech normal.        Behavior: Behavior normal. Behavior is cooperative.     Assessment and Plan:   Chest pain, unspecified type; Flank pain EKG tracing is personally reviewed.  EKG notes NSR.  No acute changes.  Reviewed also with patient's Primary Care Provider (PCP), Dr Tana Conch Recommend close follow-up with cardiology Recommend chest xray for ongoing R-sided intermittent chest pain Recommend trial of 40 mg omeprazole (usually takes 20 mg) x 2 weeks to see if this makes a difference Also recommend consideration of NSAIDs If any new/worsening symptom(s), recommend ER evaluation as originally instructed  Jarold Motto, PA-C  Ladona Mow M Kadhim,acting as a scribe for Jarold Motto, PA.,have documented all relevant documentation on the behalf of Jarold Motto, PA,as directed by  Jarold Motto, PA while in the presence of Jarold Motto, [place].   I, Jarold Motto, [place], have reviewed all  documentation for this visit. The documentation on 11/10/23 for the exam, diagnosis, procedures, and orders are all accurate and complete.

## 2023-11-10 NOTE — Telephone Encounter (Signed)
 Noted.

## 2023-11-10 NOTE — Patient Instructions (Addendum)
 It was great to see you!  EKG looks fine Recommend close follow-up with cardiology to review your recent episode  Increase your omeprazole to 40 mg daily - double for two weeks  Chest xray  I will also update blood work to make sure nothing else is going on  If any new/worsening symptom(s), please let me know   Take care,  Jarold Motto PA-C

## 2023-11-11 ENCOUNTER — Encounter: Payer: Self-pay | Admitting: Physician Assistant

## 2023-11-11 LAB — COMPREHENSIVE METABOLIC PANEL
ALT: 43 U/L (ref 0–53)
AST: 28 U/L (ref 0–37)
Albumin: 4.2 g/dL (ref 3.5–5.2)
Alkaline Phosphatase: 82 U/L (ref 39–117)
BUN: 16 mg/dL (ref 6–23)
CO2: 26 meq/L (ref 19–32)
Calcium: 9.3 mg/dL (ref 8.4–10.5)
Chloride: 101 meq/L (ref 96–112)
Creatinine, Ser: 0.97 mg/dL (ref 0.40–1.50)
GFR: 87.24 mL/min (ref >60.00)
Glucose, Bld: 229 mg/dL — ABNORMAL HIGH (ref 70–99)
Potassium: 4.2 meq/L (ref 3.5–5.1)
Sodium: 137 meq/L (ref 135–145)
Total Bilirubin: 0.7 mg/dL (ref 0.2–1.2)
Total Protein: 7.1 g/dL (ref 6.0–8.3)

## 2023-11-11 LAB — CBC WITH DIFFERENTIAL/PLATELET
Basophils Absolute: 0 10*3/uL (ref 0.0–0.1)
Basophils Relative: 0.6 % (ref 0.0–3.0)
Eosinophils Absolute: 0.3 10*3/uL (ref 0.0–0.7)
Eosinophils Relative: 4.6 % (ref 0.0–5.0)
HCT: 42 % (ref 39.0–52.0)
Hemoglobin: 14 g/dL (ref 13.0–17.0)
Lymphocytes Relative: 29.8 % (ref 12.0–46.0)
Lymphs Abs: 1.8 10*3/uL (ref 0.7–4.0)
MCHC: 33.4 g/dL (ref 30.0–36.0)
MCV: 88.2 fl (ref 78.0–100.0)
Monocytes Absolute: 0.5 10*3/uL (ref 0.1–1.0)
Monocytes Relative: 7.8 % (ref 3.0–12.0)
Neutro Abs: 3.5 10*3/uL (ref 1.4–7.7)
Neutrophils Relative %: 57.2 % (ref 43.0–77.0)
Platelets: 227 10*3/uL (ref 150.0–400.0)
RBC: 4.76 Mil/uL (ref 4.22–5.81)
RDW: 13.8 % (ref 11.5–15.5)
WBC: 6.1 10*3/uL (ref 4.0–10.5)

## 2023-11-25 ENCOUNTER — Ambulatory Visit (INDEPENDENT_AMBULATORY_CARE_PROVIDER_SITE_OTHER): Payer: Self-pay | Admitting: Family Medicine

## 2023-11-25 ENCOUNTER — Encounter: Payer: Self-pay | Admitting: Family Medicine

## 2023-11-25 VITALS — BP 130/68 | HR 89 | Ht 69.0 in | Wt 315.2 lb

## 2023-11-25 DIAGNOSIS — Z794 Long term (current) use of insulin: Secondary | ICD-10-CM

## 2023-11-25 DIAGNOSIS — E039 Hypothyroidism, unspecified: Secondary | ICD-10-CM

## 2023-11-25 DIAGNOSIS — E119 Type 2 diabetes mellitus without complications: Secondary | ICD-10-CM

## 2023-11-25 DIAGNOSIS — I10 Essential (primary) hypertension: Secondary | ICD-10-CM

## 2023-11-25 DIAGNOSIS — Z7985 Long-term (current) use of injectable non-insulin antidiabetic drugs: Secondary | ICD-10-CM

## 2023-11-25 LAB — POCT GLYCOSYLATED HEMOGLOBIN (HGB A1C): Hemoglobin A1C: 8 % — AB (ref 4.0–5.6)

## 2023-11-25 NOTE — Patient Instructions (Addendum)
 Get diabetic eye exam scheduled.  diabetes  poorly controlled but improving- he is going to continue current medications - just started Ozempic and had considered adjusting insulin but still on start dose- so wait until we get him up to higher dose before making decision on that -we discussed urine microalbumin/creatinine ratio lab error from [facility] about which he received a mychart message- thankfully he is not negatively affected - though we do ned to control a1c  -he will let me know if any low blood sugars under 80   AFB GI contact- once you get an insurance plan especially with precancerous polyp history Please call to schedule visit and/or procedure Address: [address] [place], [place], [place] [address] Phone: [phone]   Please stop by lab before you go- just for urine If you have mychart- we will send your results within 3 business days of [place] receiving them.  If you do not have mychart- we will call you about results within 5 business days of [place] receiving them.  *please also note that you will see labs on mychart as soon as they post. I will later go in and write notes on them- will say "notes from Dr. Durene Cal"   Recommended follow up: Return in about 4 months (around 03/26/2024) for followup or sooner if needed.Schedule b4 you leave.

## 2023-11-25 NOTE — Progress Notes (Signed)
 Phone [phone] In person visit   Subjective:   Gabriel [name] is a 58 y.o. 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    Priority: Medium    Morbid obesity (HCC) 08/10/2013    Priority: Medium    Essential hypertension 02/06/2011    Priority: Medium    Hypothyroidism 02/06/2011    Priority: Medium    Hyperlipidemia associated with type 2 diabetes mellitus (HCC) 02/06/2011    Priority: Medium    Colon cancer screening 10/27/2018    Priority: Low   Midepigastric pain 09/27/2018    Priority: Low   Change in bowel habits 09/27/2018    Priority: Low   Family hx colonic polyps 09/27/2018    Priority: Low   BRBPR (bright red blood per rectum) 09/27/2018    Priority: Low   Left lateral epicondylitis 03/18/2016    Priority: Low   GERD (gastroesophageal reflux disease) 02/01/2015    Priority: Low   Dysphagia 09/30/2013    Priority: Low   Aortic atherosclerosis (HCC) 01/30/2023   Dyslipidemia 09/30/2021   Myalgia 03/27/2021   Chest pain of uncertain etiology 10/03/2019    Medications- reviewed and updated Current Outpatient Medications  Medication Sig Dispense Refill   acetaminophen (TYLENOL) 500 MG tablet Take 1,000 mg by mouth every 6 (six) hours as needed for mild pain or moderate pain. Reported on 01/09/2016     aspirin EC 81 MG EC tablet Take 1 tablet (81 mg total) by mouth daily.     glimepiride (AMARYL) 4 MG tablet TAKE 2 TABLETS BY MOUTH ONCE DAILY BEFORE BREAKFAST 180 tablet 0   glucose blood (ONE TOUCH TEST STRIPS) test strip Up to 4 times daily.   Onetouch Verio.  E11.65 100 each 12   insulin NPH-regular Human (70-30) 100 UNIT/ML injection Inject 10-30 Units into the skin 2 (two) times daily with a meal. Start with 20 units am and 10 units in the PM and update me after a week 10 mL 11   Lancets (ONETOUCH ULTRASOFT) lancets Up to 4 times daily.  Onetouch Verio.  DxE11.65 100 each 12   levothyroxine (SYNTHROID) 125 MCG tablet Take 1 tablet (125 mcg total) by mouth daily before breakfast. 90 tablet 3   losartan (COZAAR) 100 MG tablet Take 1 tablet (100 mg total) by mouth daily. 90 tablet 3   metFORMIN (GLUCOPHAGE) 1000 MG tablet TAKE 1 TABLET BY MOUTH TWICE DAILY WITH A MEAL 180 tablet 0   Multiple Vitamins-Minerals (MULTIVITAMIN,TX-MINERALS) tablet Take 1 tablet by mouth daily.     nitroGLYCERIN (NITROSTAT) 0.4 MG SL tablet Place 1 tablet (0.4 mg total) under the tongue every 5 (five) minutes as needed for chest pain (CP or SOB). 25 tablet 3   Omega-3 Fatty Acids (FISH OIL) 1000 MG CAPS Take 1,000 mg by mouth every other day.     omeprazole (PRILOSEC) 20 MG capsule Take 1 capsule (20 mg total) by mouth daily. 30 capsule 3   rosuvastatin (CRESTOR) 5 MG tablet Take 1 tablet by mouth once daily 90 tablet  0   Semaglutide,0.25 or 0.5MG /DOS, (OZEMPIC, 0.25 OR 0.5 MG/DOSE,) 2 MG/3ML SOPN 0.25 mg x 4 weeks, then increase to 0.5 mg weekly.     tamsulosin (FLOMAX) 0.4 MG CAPS capsule Take 1 capsule (0.4 mg total) by mouth daily. 30 capsule 3   tiZANidine (ZANAFLEX) 4 MG tablet Take 1 tablet (4 mg total) by mouth every 8 (eight) hours as needed for muscle spasms (do not drive for 8 hours after taking). (Patient not taking: Reported on 11/25/2023) 30 tablet 0   No current facility-administered medications for this visit.     Objective:  BP 130/68   Pulse 89   Ht 5\' 9"  (1.753 m)   Wt (!) 315 lb 3.2 oz (143 kg)   SpO2 96%   BMI 46.55 kg/m  Gen: NAD, resting comfortably CV: RRR no murmurs rubs or gallops Lungs: CTAB no crackles, wheeze,  rhonchi Ext: trace edema Skin: warm, dry Neuro: grossly normal, moves all extremities  Diabetic Foot Exam - Simple   Simple Foot Form Diabetic Foot exam was performed with the following findings: Yes 11/25/2023  2:10 PM  Visual Inspection No deformities, no ulcerations, no other skin breakdown bilaterally: Yes Sensation Testing Intact to touch and monofilament testing bilaterally: Yes Pulse Check Posterior Tibialis and Dorsalis pulse intact bilaterally: Yes Comments        Assessment and Plan   #right lateral chest pain wraps around into the back and can go into right upper chest and right lower chest at times- dates all the way back to November Emergency Department visit- CT scan reassuring, CMP and CBC reassuring (other than mild LFT elevations- later normalized in december) and lipase normal at that time. Does have hiatal hernia and certain positions bother him. He did try reflux medicine and avoiding greasyfoods have helped. CXR ordered on 11/10/23 with [facility] still pending. Thankfully no shortness of breath with it.  -still advise cardiology follow up to be on safe side - I do wonder if hiatal hernia is cause especially with reflux mediicne helping. Gas x helping as well.   # CAD  #hyperlipidemia #aortic atherosclerosis S: Medication:Aspirin 81 mg, fish oil, rosuvastatin 5 mg daily - no left or central chest pain- does get right sided chest pain at times Lab Results  Component Value Date   CHOL 117 11/19/2022   HDL 36.50 (L) 11/19/2022   LDLCALC 44 11/19/2022   LDLDIRECT 68.0 03/11/2019   TRIG 183.0 (H) 11/19/2022   CHOLHDL 3 11/19/2022   A/P: lipids at goal last year- recheck next visit likely- just had bloodwork about 2 weeks ago and we are checking a1c by Point of Care (POC). Coronary artery disease likely asymptomatic but want him to see cardiology to make sure on right sided chest pain.  Aortic atherosclerosis (presumed stable)- LDL goal ideally <70 - at goal for  that  # Diabetes with morbid obesity S: Medication: Glimepiride 8 mg, insulin NPH 70/30 twice daily 20  units in the morning, 10 units in the p.m., metformin 1000 mg twice daily, cardiology started Ozempic- he just astarted the 0.25 two weeks ago CBGs- improving down to 140 lately as day goes on- 180 in morning. No sugars below 120 Exercise and diet- no intentional regular exercise- active and has to dedicate time to care for dad. Will mow 4 yards a week -has tried to reduce cabrs Lab Results  Component Value Date   HGBA1C    POC 8.0 (A) 11/25/2023   HGBA1C 9.9 (H) 08/19/2023  HGBA1C 9.5 (H) 11/19/2022  A/P: diabetes  poorly controlled but improving- he is going to continue current medications - just started Ozempic and had considered adjusting insulin but still on start dose- so wait until we get him up to higher dose before making decision on that -we discussed urine microalbumin/creatinine ratio lab error from [place] lab about which he received a mychart message- thankfully he is not negatively affected - though we do ned to control a1c  -he will let me know if any low blood sugars under 80  #hypertension S: medication: Losartan 100 mg BP Readings from Last 3 Encounters:  11/25/23 130/68  11/10/23 132/64  08/19/23 130/62  A/P: well controlled continue current medications   #hypothyroidism S: compliant On thyroid medication-levothyroxine 125 mcg Lab Results  Component Value Date   TSH 1.80 08/19/2023  A/P:stable- continue current medicines     # BPH-controlled tamsulosin 0.4 mg daily    # GERD S:Medication: Omeprazole 20 mg A/P: symptoms improved back on omeprazole- advised to continue    Recommended follow up: Return in about 4 months (around 03/26/2024) for followup or sooner if needed.Schedule b4 you leave.  Lab/Order associations:   ICD-10-CM   1. Diabetes mellitus without complication (HCC)  E11.9 Urine Microalbumin w/creat. ratio    POCT glycosylated hemoglobin (Hb  A1C)    2. Essential hypertension  I10     3. Hypothyroidism, unspecified type  E03.9      No orders of the defined types were placed in this encounter.  Return precautions advised.  Tana Conch, MD

## 2023-11-26 ENCOUNTER — Encounter: Payer: Self-pay | Admitting: Family Medicine

## 2023-11-26 LAB — MICROALBUMIN / CREATININE URINE RATIO
Creatinine,U: 78.9 mg/dL
Microalb Creat Ratio: 9.1 mg/g (ref 0.0–30.0)
Microalb, Ur: 0.7 mg/dL (ref 0.0–1.9)

## 2023-12-01 ENCOUNTER — Encounter: Payer: Self-pay | Admitting: Physician Assistant

## 2024-01-06 ENCOUNTER — Other Ambulatory Visit: Payer: Self-pay | Admitting: Family Medicine

## 2024-01-12 ENCOUNTER — Other Ambulatory Visit: Payer: Self-pay

## 2024-01-12 MED ORDER — TAMSULOSIN HCL 0.4 MG PO CAPS: 0.4000 mg | ORAL_CAPSULE | Freq: Every day | ORAL | 3 refills | Status: AC

## 2024-01-19 ENCOUNTER — Ambulatory Visit (INDEPENDENT_AMBULATORY_CARE_PROVIDER_SITE_OTHER): Payer: Self-pay | Admitting: Physician Assistant

## 2024-01-19 VITALS — BP 127/82 | HR 95 | Temp 97.3°F | Ht 69.0 in | Wt 313.2 lb

## 2024-01-19 DIAGNOSIS — M25561 Pain in right knee: Secondary | ICD-10-CM

## 2024-01-19 DIAGNOSIS — M25511 Pain in right shoulder: Secondary | ICD-10-CM

## 2024-01-19 NOTE — Progress Notes (Signed)
 Gabriel [name] is a 58 y.o. male here for a new problem.  History of Present Illness:   Chief Complaint  Patient presents with   Shoulder Pain    Patient stated he is having shoulder and elbow pain. Pain in rotator cuff, into biceps and down arm into hands.    Knee Pain    States he can't bend right knee.     HPI  Right shoulder and right arm pain: Pt complains of right shoulder pain that radiates into his right arm starting Saturday, 5/3.  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 He reports difficulty reaching for a gallon of milk, limited ROM, and right hand weakness. Pt states he experienced similar symptoms previously on his left forearm, though less painful. Upon palpation, he reports pain mostly on the front of his shoulder. Upon lifting his arm, he reports pain on the top of shoulder. He endorses applying topical magnesium, to moderate clinical effect. Denies pain on the back or side of his shoulder.   Right knee pain: Pt complains of right knee pain starting Saturday, 5/3.  He reports the pain at the back of his knee and above his knee, as well as overall joint pain. Pt reports limited ROM and difficulty putting weight on his right leg. He endorses taking Tylenol  as needed and applying ice and topical magnesium, to moderate clinical effect. He notes past two past surgeries on his right knee. Reports slight right leg swelling. Pt states he was previously established with [facility] over 20 years ago. Pt also states he has a knee brace / compression sleeves at home and will use them in the meantime.   Joint pain: Pt complains of joint pain.  Pt states he is unsure if pain is due to a side effect of Ozempic or Rosuvastatin . Denies ankle swelling.  Past Medical History:  Diagnosis Date   Arthritis    "joints; from where I've had surgeries" (08/17/2013)   Chest pain    a. 08/2013 Cardia  CTA: Ca score 238 (95%), LM nl, LAD mod stenosis in D2 area, D1 mild ost stenosis, D2 no signif dzs, LCX small, RCA nl.   Coronary artery disease    LHC (08/18/13):  pLAD 50, CFX and RCA normal.  EF 55-65%.  LAD lesion FFR:  0.88 (normal).  Med rx recommended.     Dysphagia    Upper endoscopy 11/2013 - without obvious stricture s/p Maloney dilation.    GERD (gastroesophageal reflux disease)    Hyperlipidemia    a. Dx 3y ago - not on statin.   Hypertension    a. Dx 3y ago.   Hypothyroidism    a. on replacement.   IBS (irritable bowel syndrome)    colonoscopy 2000   Obesity    Sleep apnea    does not use CPAP   Snoring    Type II diabetes mellitus (HCC)      Social History   Tobacco Use   Smoking status: Former    Current packs/day: 0.00    Average packs/day: 1.5 packs/day for 24.0 years (36.0 ttl pk-yrs)    Types: Cigarettes    Start date: 09/10/1983    Quit date: 09/10/2007    Years since quitting: 16.3   Smokeless tobacco: Never  Vaping Use   Vaping status: Never Used  Substance Use Topics   Alcohol use: No   Drug use: No    Past  Surgical History:  Procedure Laterality Date   ANKLE SURGERY Right    "had a growth in it; cut it out" (08/17/2013)   CARDIAC CATHETERIZATION N/A 09/28/2015   Procedure: Left Heart Cath and Coronary Angiography;  Surgeon: Peter M Swaziland, MD;  Location: [facility] INVASIVE CV LAB;  Service: Cardiovascular;  Laterality: N/A;   CHOLECYSTECTOMY     CORONARY PRESSURE/FFR STUDY N/A 10/04/2019   Procedure: INTRAVASCULAR PRESSURE WIRE/FFR STUDY;  Surgeon: Lucendia Rusk, MD;  Location: [facility] INVASIVE CV LAB;  Service: Cardiovascular;  Laterality: N/A;   CORONARY STENT INTERVENTION N/A 10/04/2019   Procedure: CORONARY STENT INTERVENTION;  Surgeon: Lucendia Rusk, MD;  Location: [facility] INVASIVE CV LAB;  Service: Cardiovascular;  Laterality: N/A;   KNEE ARTHROSCOPY Right X 2   LEFT HEART CATH AND CORONARY ANGIOGRAPHY N/A 10/04/2019   Procedure: LEFT HEART CATH  AND CORONARY ANGIOGRAPHY;  Surgeon: Lucendia Rusk, MD;  Location: [facility] INVASIVE CV LAB;  Service: Cardiovascular;  Laterality: N/A;   LEFT HEART CATHETERIZATION WITH CORONARY ANGIOGRAM N/A 08/18/2013   Procedure: LEFT HEART CATHETERIZATION WITH CORONARY ANGIOGRAM;  Surgeon: Peter M Swaziland, MD;  Location: [facility] CATH LAB;  Service: Cardiovascular;  Laterality: N/A;   SHOULDER ARTHROSCOPY W/ ROTATOR CUFF REPAIR Left     Family History  Adopted: Yes  Problem Relation Age of Onset   Hypertension Mother    Obesity Mother    Diabetes Mother    Liver cancer Mother        with mets to the GB   Coronary artery disease Father        s/p MI in his early 30's->CABG x 5 @ age 66, currently 7.   Diabetes Father    Stroke Father    Hypertension Father    Diabetes Brother    Obesity Brother    Asthma Maternal Grandfather    Colon cancer Neg Hx    Esophageal cancer Neg Hx    Inflammatory bowel disease Neg Hx    Liver disease Neg Hx    Pancreatic cancer Neg Hx    Rectal cancer Neg Hx    Stomach cancer Neg Hx     No Known Allergies  Current Medications:   Current Outpatient Medications:    acetaminophen  (TYLENOL ) 500 MG tablet, Take 1,000 mg by mouth every 6 (six) hours as needed for mild pain or moderate pain. Reported on 01/09/2016, Disp: , Rfl:    aspirin  EC 81 MG EC tablet, Take 1 tablet (81 mg total) by mouth daily., Disp: , Rfl:    glimepiride  (AMARYL ) 4 MG tablet, TAKE 2 TABLETS BY MOUTH ONCE DAILY BEFORE BREAKFAST, Disp: 180 tablet, Rfl: 0   glucose blood (ONE TOUCH TEST STRIPS) test strip, Up to 4 times daily.  Onetouch Verio.  E11.65, Disp: 100 each, Rfl: 12   insulin  NPH-regular Human (70-30) 100 UNIT/ML injection, Inject 10-30 Units into the skin 2 (two) times daily with a meal. Start with 20 units am and 10 units in the PM and update me after a week, Disp: 10 mL, Rfl: 11   Lancets (ONETOUCH ULTRASOFT) lancets, Up to 4 times daily.  Onetouch Verio.  DxE11.65, Disp: 100 each, Rfl:  12   levothyroxine  (SYNTHROID ) 125 MCG tablet, Take 1 tablet (125 mcg total) by mouth daily before breakfast., Disp: 90 tablet, Rfl: 3   losartan  (COZAAR ) 100 MG tablet, Take 1 tablet (100 mg total) by mouth daily., Disp: 90 tablet, Rfl: 3   metFORMIN  (GLUCOPHAGE ) 1000 MG tablet, TAKE 1  TABLET BY MOUTH TWICE DAILY WITH A MEAL, Disp: 180 tablet, Rfl: 0   Multiple Vitamins-Minerals (MULTIVITAMIN,TX-MINERALS) tablet, Take 1 tablet by mouth daily., Disp: , Rfl:    nitroGLYCERIN  (NITROSTAT ) 0.4 MG SL tablet, Place 1 tablet (0.4 mg total) under the tongue every 5 (five) minutes as needed for chest pain (CP or SOB)., Disp: 25 tablet, Rfl: 3   Omega-3 Fatty Acids (FISH OIL) 1000 MG CAPS, Take 1,000 mg by mouth every other day., Disp: , Rfl:    omeprazole  (PRILOSEC) 20 MG capsule, Take 1 capsule (20 mg total) by mouth daily., Disp: 30 capsule, Rfl: 3   rosuvastatin  (CRESTOR ) 5 MG tablet, Take 1 tablet by mouth once daily, Disp: 90 tablet, Rfl: 0   Semaglutide,0.25 or 0.5MG /DOS, (OZEMPIC, 0.25 OR 0.5 MG/DOSE,) 2 MG/3ML SOPN, 0.25 mg x 4 weeks, then increase to 0.5 mg weekly., Disp: , Rfl:    tamsulosin  (FLOMAX ) 0.4 MG CAPS capsule, Take 1 capsule (0.4 mg total) by mouth daily., Disp: 90 capsule, Rfl: 3   tiZANidine  (ZANAFLEX ) 4 MG tablet, Take 1 tablet (4 mg total) by mouth every 8 (eight) hours as needed for muscle spasms (do not drive for 8 hours after taking). (Patient not taking: Reported on 01/19/2024), Disp: 30 tablet, Rfl: 0   Review of Systems:   Negative unless otherwise specified per HPI.  Vitals:   Vitals:   01/19/24 0941 01/19/24 1010  BP: (!) 146/76 127/82  Pulse: 95   Temp: (!) 97.3 F (36.3 C)   TempSrc: Temporal   SpO2: 95%   Weight: (!) 313 lb 3.2 oz (142.1 kg)   Height: 5\' 9"  (1.753 m)      Body mass index is 46.25 kg/m.  Physical Exam:   Physical Exam Vitals and nursing note reviewed.  Constitutional:      General: He is not in acute distress.    Appearance: He is  well-developed. He is not ill-appearing or toxic-appearing.  Cardiovascular:     Rate and Rhythm: Normal rate and regular rhythm.     Pulses: Normal pulses.     Heart sounds: Normal heart sounds, S1 normal and S2 normal.  Pulmonary:     Effort: Pulmonary effort is normal.     Breath sounds: Normal breath sounds.  Musculoskeletal:     Comments: Tenderness to palpation to lateral right knee Tenderness to palpation to anterior right shoulder Limited range of motion of flexion of right knee Limited range of motion of abduction of R shoulder   No calf tenderness to palpation/swelling/erythema  Skin:    General: Skin is warm and dry.  Neurological:     Mental Status: He is alert.     GCS: GCS eye subscore is 4. GCS verbal subscore is 5. GCS motor subscore is 6.  Psychiatric:        Speech: Speech normal.        Behavior: Behavior normal. Behavior is cooperative.     Assessment and Plan:   1. Acute pain of right shoulder (Primary) Possible rotator cuff pathology Limited medication treatment options due to history of stent and uncontrolled DM Recommend avoiding aggressive activities and refer to sports medicine for diagnosis and management  - Ambulatory referral to Sports Medicine  2. Acute pain of right knee Limited medication treatment options due to history of stent and uncontrolled DM Recommend avoiding aggressive activities and refer to sports medicine for diagnosis and management  Recommend knee brace/sleeve -- he has one at home - Ambulatory referral to  Sports Medicine    I, Timoteo Force, acting as a Neurosurgeon for [employer], [place]., have documented all relevant documentation on the behalf of Alexander Iba, [place], as directed by  Alexander Iba, PA while in the presence of Alexander Iba, [place].  I, Alexander Iba, [place], have reviewed all documentation for this visit. The documentation on 01/19/24 for the exam, diagnosis, procedures, and orders are all accurate and  complete.  Alexander Iba, PA-C

## 2024-01-19 NOTE — Patient Instructions (Signed)
 It was great to see you!  A referral has been placed for you to see one of our fantastic providers at [facility]. Someone from their office will be in touch soon regarding scheduling your appointment.  Their location:  [facility]  [address] on the 1st floor Phone number [phone] Fax [phone].   This location is across the street from the entrance to [employer] [facility] and in the same complex as the [facility]   Take care,  Jarold Motto PA-C

## 2024-01-22 ENCOUNTER — Ambulatory Visit: Payer: Self-pay

## 2024-01-22 NOTE — Telephone Encounter (Signed)
  Chief Complaint: urinary symptoms Symptoms: burning, frequency Frequency: increased Pertinent Negatives: Patient denies fever, blood, back/flank pain, swelling Disposition: [] ED /[] Urgent Care (no appt availability in office) / [x] Appointment(In office/virtual)/ []  [facility] [place] [facility]/ [] Home Care/ [] Refused Recommended Disposition /[] [facility]/ []  Follow-up with PCP Additional Notes:  Calling in to request medication for UTI. Burning on urination, feels he is not fully emptying bladder, "some left in the end'. Symptoms started on Monday. He is voiding frequently, up to 30 times due to irritation feeling.  On flomax .  Increased his fluids and drinking cranberry juice. He doesn't feel unwell. Would prefer prescription to be called in rather than an office visit, he was just in office on 01/19/24 for shoulder pain, explained visit is needed to test urine. Patient in agreement with acute visit, scheduled with an alternate provider on 01/23/24. Educated on care advice as documented in protocol, patient verbalized understanding.     Copied from CRM [id number]. Topic: Clinical - Red Word Triage >> Jan 22, 2024  4:29 PM [place] J wrote: [facility] that prompted transfer to Nurse Triage: Patient has a UTI and is having burning and irritation when urinating. Reason for Disposition  Diabetes mellitus or weak immune system (e.g., HIV positive, cancer chemo, splenectomy, organ transplant, chronic steroids)  Protocols used: Urination Pain - Male-A-AH

## 2024-01-23 ENCOUNTER — Ambulatory Visit (INDEPENDENT_AMBULATORY_CARE_PROVIDER_SITE_OTHER): Payer: Self-pay | Admitting: Family Medicine

## 2024-01-23 ENCOUNTER — Encounter: Payer: Self-pay | Admitting: Family Medicine

## 2024-01-23 ENCOUNTER — Telehealth: Payer: Self-pay

## 2024-01-23 VITALS — BP 109/69 | HR 93 | Temp 97.2°F | Ht 69.0 in | Wt 311.0 lb

## 2024-01-23 DIAGNOSIS — N390 Urinary tract infection, site not specified: Secondary | ICD-10-CM

## 2024-01-23 LAB — POCT URINALYSIS DIPSTICK
Bilirubin, UA: NEGATIVE
Blood, UA: NEGATIVE
Glucose, UA: NEGATIVE
Ketones, UA: NEGATIVE
Nitrite, UA: NEGATIVE
Protein, UA: NEGATIVE
Spec Grav, UA: >=1.03 — AB (ref 1.010–1.025)
Urobilinogen, UA: 2 U/dL
pH, UA: 5 (ref 5.0–8.0)

## 2024-01-23 MED ORDER — NITROFURANTOIN MONOHYD MACRO 100 MG PO CAPS: 100.0000 mg | ORAL_CAPSULE | Freq: Two times a day (BID) | ORAL | 0 refills | Status: DC

## 2024-01-23 NOTE — Telephone Encounter (Signed)
Pt is already scheduled with a provider

## 2024-01-23 NOTE — Telephone Encounter (Signed)
 Please call and schedule ov with pt to be evaluated.  Copied from CRM [id number]. Topic: Clinical - Medication Question >> Jan 22, 2024  4:26 PM Gabriel [name] wrote: Reason for CRM: Patient has a UTI and was wondering if something could be sent in to help him get rid of it.

## 2024-01-23 NOTE — Patient Instructions (Signed)
 It was nice to see you!  You have a urinary tract infection. Please start the antibiotic.  We will check a urine culture to make sure you do not have a resistant bacteria. We will call you if we need to change your medications.   Please make sure you are drinking plenty of fluids over the next few days.  If your symptoms do not improve over the next 5-7 days, or if they worsen, please let us know. Please also let us know if you have worsening back pain, fevers, chills, or body aches.   Take care, Dr Jimmey Ralph

## 2024-01-23 NOTE — Progress Notes (Signed)
   Gabriel [name] is a 58 y.o. male who presents today for an office visit.  Assessment/Plan:  Urinary tract infection History and UA consistent with UTI.  No red flags.  No signs of systemic illness.  Will start Macrobid while we await culture results.  We encouraged hydration.  We discussed reasons to return to care.     Subjective:  HPI:  See A/P for status of chronic conditions.  Patient is here today with concern for UTI.  Symptoms started 5 days ago.  Has sensation of incomplete emptying of his bladder.  Also with some dysuria and increased frequency as well. Feels similar to previous UTIs. No fevers or chills. Symptoms seem to be improving the last few days.  No reported nausea or vomiting.       Objective:  Physical Exam: BP 109/69   Pulse 93   Temp (!) 97.2 F (36.2 C) (Temporal)   Ht 5\' 9"  (1.753 m)   Wt (!) 311 lb (141.1 kg)   SpO2 94%   BMI 45.93 kg/m   Gen: No acute distress, resting comfortably CV: Regular rate and rhythm with no murmurs appreciated Pulm: Normal work of breathing, clear to auscultation bilaterally with no crackles, wheezes, or rhonchi Neuro: Grossly normal, moves all extremities Psych: Normal affect and thought content      Kalid Ghan M. Daneil Dunker, MD 01/23/2024 11:30 AM

## 2024-01-25 LAB — URINE CULTURE
MICRO NUMBER:: 16466062
SPECIMEN QUALITY:: ADEQUATE

## 2024-01-26 ENCOUNTER — Ambulatory Visit: Payer: Self-pay | Admitting: Family Medicine

## 2024-01-26 ENCOUNTER — Ambulatory Visit: Payer: Self-pay

## 2024-01-26 ENCOUNTER — Other Ambulatory Visit: Payer: Self-pay

## 2024-01-26 VITALS — BP 150/70 | HR 102 | Ht 69.0 in | Wt 310.6 lb

## 2024-01-26 DIAGNOSIS — G8929 Other chronic pain: Secondary | ICD-10-CM

## 2024-01-26 DIAGNOSIS — M25511 Pain in right shoulder: Secondary | ICD-10-CM

## 2024-01-26 DIAGNOSIS — M1711 Unilateral primary osteoarthritis, right knee: Secondary | ICD-10-CM

## 2024-01-26 DIAGNOSIS — M25561 Pain in right knee: Secondary | ICD-10-CM

## 2024-01-26 NOTE — Patient Instructions (Addendum)
 Thank you for coming in today.   You received an injection today. Seek immediate medical attention if the joint becomes red, extremely painful, or is oozing fluid.   See you back as needed.

## 2024-01-26 NOTE — Progress Notes (Signed)
 I, Leone Ralphs am a scribe for Dr. Garlan Juniper, MD.  Gabriel [name] is a 58 y.o. male who presents to [facility] at [facility] today for right knee and right shoulder pain, referred by Alexander Iba, PA.   Today, patient c/o right shoulder pain x 2+ weeks. 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 C/O limited ROM reaching above 90 degrees, also having weakness/decreased grip strength. Pt locates pain to anterior aspect the the shoulder, radiating into the upper arm. Short-term relief with topical Magnesium.   Pt also c/o right knee pain x 2+ weeks. Pt locates pain to joint line and posterior aspect of the knee. Pt c/o limited ROM and difficulty with WB and ambulation. Has tried IBU, topical Mg, bracing/compression, and ice with no long-term relief. Hx of surgical repair to the right knee x 2, with [facility]. Notes slight swelling in the right leg.   Patient states that he has been taking tylenol  and magnesium roll on as well for the pain. Uses ice and heat to try and get the swelling down. 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 Patient states that it is probably overuse from caring for his father and also past warehouse work.   Pertinent review of systems: No fevers or chills  Relevant historical information: Not ideally controlled diabetes.  History of 2 arthroscopic surgeries right knee.  Last one was about 20 years ago.  Patient is not currently employed but spends of the considerable amount of his time caring for his father who is ill.  He does not currently have health insurance.   Exam:  BP (!) 150/70   Pulse (!) 102   Ht 5\' 9"  (1.753 m)   Wt (!) 310 lb 9.6 oz (140.9 kg)   SpO2 95%   BMI 45.87 kg/m  General: Well Developed, well nourished, and in no acute distress.   MSK: Right shoulder normal-appearing Range of motion abduction 90 degrees. Functional internal rotation lumbar spine. External  rotation is full. Strength is intact. Positive Hawkins and Neer's test.  Right knee: Large joint effusion. Decreased range of motion. Tender palpation medial joint line.    Lab and Radiology Results  Procedure: Real-time Ultrasound Guided aspiration and injection of right knee joint superior lateral patellar space Device: Philips Affiniti 50G/GE Logiq Images permanently stored and available for review in PACS Verbal informed consent obtained.  Discussed risks and benefits of procedure. Warned about infection, bleeding, hyperglycemia damage to structures among others. Patient expresses understanding and agreement Time-out conducted.   Noted no overlying erythema, induration, or other signs of local infection.   Skin prepped in a sterile fashion.   Local anesthesia: Topical Ethyl chloride.   With sterile technique and under real time ultrasound guidance: 2 mL of lidocaine  injected into subcutaneous tissue achieving good anesthesia. Skin was again sterilized with isopropyl alcohol and 18-gauge needle was used to access the knee joint. 30 mL of clear yellow fluid was aspirated. Syringe was exchanged and 40 mg of Kenalog  and 2 mm of Marcaine were injected into the now decompressed knee joint. Completed without difficulty   Pain immediately resolved suggesting accurate placement of the medication.   Advised to call if fevers/chills, erythema, induration, drainage, or persistent bleeding.   Images permanently stored and available for review in the ultrasound unit.  Impression: Technically successful ultrasound guided injection.   X-ray images right shoulder and right knee obtained today personally and independently interpreted.  Right shoulder: Mild glenohumeral DJD.  No acute fractures  Right knee: Mild to moderate medial DJD.  Mild patellofemoral DJD.  No acute fractures.  Await formal radiology review     Assessment and Plan: 58 y.o. male with chronic right knee pain due to  DJD exacerbation.  Plan for aspiration and steroid injection today.  He can return as soon as next week for right shoulder subacromial bursa injection.  I would like to avoid 2 injections and 1 visit as that would increase his blood sugar too much.  His A1c is already around 8.  I also provided information for [facility] health assistance.   PDMP not reviewed this encounter. Orders Placed This Encounter  Procedures   US  LIMITED JOINT SPACE STRUCTURES LOW RIGHT(NO LINKED CHARGES)    Reason for Exam (SYMPTOM  OR DIAGNOSIS REQUIRED):   right knee pain / right shoulder pain    Preferred imaging location?:   [facility] Knee AP/LAT W/[place] Right    Standing Status:   Future    Number of Occurrences:   1    Expiration Date:   02/26/2024    Reason for Exam (SYMPTOM  OR DIAGNOSIS REQUIRED):   right knee pain    Preferred imaging location?:   [facility]   DG Shoulder Right    Standing Status:   Future    Number of Occurrences:   1    Expiration Date:   01/25/2025    Reason for Exam (SYMPTOM  OR DIAGNOSIS REQUIRED):   right shoulder pain    Preferred imaging location?:   [facility]   No orders of the defined types were placed in this encounter.    Discussed warning signs or symptoms. Please see discharge instructions. Patient expresses understanding  The above documentation has been reviewed and is accurate and complete Garlan Juniper, M.D.

## 2024-01-26 NOTE — Progress Notes (Signed)
 His urine culture confirms UTI.  The antibiotic we have him on should treat this.  He should let us  know if his symptoms are not improving with antibiotics.

## 2024-01-27 DIAGNOSIS — M1711 Unilateral primary osteoarthritis, right knee: Secondary | ICD-10-CM | POA: Insufficient documentation

## 2024-01-28 ENCOUNTER — Ambulatory Visit: Payer: Self-pay | Admitting: Family Medicine

## 2024-01-28 ENCOUNTER — Other Ambulatory Visit: Payer: Self-pay | Admitting: Family Medicine

## 2024-02-03 ENCOUNTER — Ambulatory Visit: Payer: Self-pay | Admitting: Family Medicine

## 2024-02-03 NOTE — Progress Notes (Deleted)
   Joanna Muck, PhD, LAT, ATC acting as a scribe for Garlan Juniper, MD.  Gabriel [name] is a 58 y.o. male who presents to [facility] at [facility] today for cont'd R shoulder pain. Pt was last seen by Dr. Alease Hunter on 01/26/24 and visit focused primarily on his R knee.   Today, pt reports R shoulder pain continues ***  Dx imaging: 01/26/24 R shoulder XR  Pertinent review of systems: ***  Relevant historical information: ***   Exam:  There were no vitals taken for this visit. General: Well Developed, well nourished, and in no acute distress.   MSK: ***    Lab and Radiology Results No results found for this or any previous visit (from the past 72 hours). No results found.     Assessment and Plan: 58 y.o. male with ***   PDMP not reviewed this encounter. No orders of the defined types were placed in this encounter.  No orders of the defined types were placed in this encounter.    Discussed warning signs or symptoms. Please see discharge instructions. Patient expresses understanding.   ***

## 2024-02-03 NOTE — Progress Notes (Signed)
 Right knee x-ray shows mild arthritis.

## 2024-02-03 NOTE — Progress Notes (Signed)
Right shoulder x-ray shows mild arthritis

## 2024-02-17 NOTE — Progress Notes (Signed)
 Patient ID: Gabriel [name], male    DOB: 21-Jul-1966, 58 y.o.   MRN: [id number]  No chief complaint on file.           Assessment & Plan:   Subjective:     Outpatient Medications Prior to Visit  Medication Sig Dispense Refill   acetaminophen  (TYLENOL ) 500 MG tablet Take 1,000 mg by mouth every 6 (six) hours as needed for mild pain or moderate pain. Reported on 01/09/2016     aspirin  EC 81 MG EC tablet Take 1 tablet (81 mg total) by mouth daily.     glimepiride  (AMARYL ) 4 MG tablet TAKE 2 TABLETS BY MOUTH ONCE DAILY BEFORE BREAKFAST 180 tablet 0   glucose blood (ONE TOUCH TEST STRIPS) test strip Up to 4 times daily.  Onetouch Verio.  E11.65 100 each 12   insulin  NPH-regular Human (70-30) 100 UNIT/ML injection Inject 10-30 Units into the skin 2 (two) times daily with a meal. Start with 20 units am and 10 units in the PM and update me after a week 10 mL 11   Lancets (ONETOUCH ULTRASOFT) lancets Up to 4 times daily.  Onetouch Verio.  DxE11.65 100 each 12   levothyroxine  (SYNTHROID ) 125 MCG tablet TAKE 1 TABLET BY MOUTH ONCE DAILY BEFORE  BREAKFAST 90 tablet 0   losartan  (COZAAR ) 100 MG tablet Take 1 tablet (100 mg total) by mouth daily. 90 tablet 3   metFORMIN  (GLUCOPHAGE ) 1000 MG tablet TAKE 1 TABLET BY MOUTH TWICE DAILY WITH A MEAL 180 tablet 0   Multiple Vitamins-Minerals (MULTIVITAMIN,TX-MINERALS) tablet Take 1 tablet by mouth daily.     nitrofurantoin , macrocrystal-monohydrate, (MACROBID ) 100 MG capsule Take 1 capsule (100 mg total) by mouth 2 (two) times daily. 14 capsule 0   nitroGLYCERIN  (NITROSTAT ) 0.4 MG SL tablet Place 1 tablet (0.4 mg total) under the tongue every 5 (five) minutes as needed for chest pain (CP or SOB). 25 tablet 3   Omega-3 Fatty Acids (FISH OIL) 1000 MG CAPS Take 1,000 mg by mouth every other day.     omeprazole  (PRILOSEC) 20 MG capsule Take 1 capsule (20 mg total) by mouth daily. 30 capsule 3   rosuvastatin  (CRESTOR ) 5 MG tablet Take 1 tablet by mouth once daily  90 tablet 0   Semaglutide,0.25 or 0.5MG /DOS, (OZEMPIC, 0.25 OR 0.5 MG/DOSE,) 2 MG/3ML SOPN 0.25 mg x 4 weeks, then increase to 0.5 mg weekly.     tamsulosin  (FLOMAX ) 0.4 MG CAPS capsule Take 1 capsule (0.4 mg total) by mouth daily. 90 capsule 3   tiZANidine  (ZANAFLEX ) 4 MG tablet Take 1 tablet (4 mg total) by mouth every 8 (eight) hours as needed for muscle spasms (do not drive for 8 hours after taking). 30 tablet 0   No facility-administered medications prior to visit.   Past Medical History:  Diagnosis Date   Arthritis    "joints; from where I've had surgeries" (08/17/2013)   Chest pain    a. 08/2013 [facility]ia CTA: Ca score 238 (95%), LM nl, LAD mod stenosis in D2 area, D1 mild ost stenosis, D2 no signif dzs, LCX small, RCA nl.   Coronary artery disease    LHC (08/18/13):  pLAD 50, CFX and RCA normal.  EF 55-65%.  LAD lesion FFR:  0.88 (normal).  Med rx recommended.     Dysphagia    Upper endoscopy 11/2013 - without obvious stricture s/p Maloney dilation.    GERD (gastroesophageal reflux disease)    Hyperlipidemia    a. Dx  3y ago - not on statin.   Hypertension    a. Dx 3y ago.   Hypothyroidism    a. on replacement.   IBS (irritable bowel syndrome)    colonoscopy 2000   Obesity    Sleep apnea    does not use CPAP   Snoring    Type II diabetes mellitus (HCC)    Past Surgical History:  Procedure Laterality Date   ANKLE SURGERY Right    "had a growth in it; cut it out" (08/17/2013)   CARDIAC CATHETERIZATION N/A 09/28/2015   Procedure: Left Heart Cath and Coronary Angiography;  Surgeon: Peter M Swaziland, MD;  Location: [facility] INVASIVE CV LAB;  Service: Cardiovascular;  Laterality: N/A;   CHOLECYSTECTOMY     CORONARY PRESSURE/FFR STUDY N/A 10/04/2019   Procedure: INTRAVASCULAR PRESSURE WIRE/FFR STUDY;  Surgeon: Lucendia Rusk, MD;  Location: [facility] INVASIVE CV LAB;  Service: Cardiovascular;  Laterality: N/A;   CORONARY STENT INTERVENTION N/A 10/04/2019   Procedure: CORONARY STENT  INTERVENTION;  Surgeon: Lucendia Rusk, MD;  [facility] INVASIVE CV LAB;  Service: Cardiovascular;  Laterality: N/A;   KNEE ARTHROSCOPY Right X 2   LEFT HEART CATH AND CORONARY ANGIOGRAPHY N/A 10/04/2019   Procedure: LEFT HEART CATH AND CORONARY ANGIOGRAPHY;  Surgeon: Lucendia Rusk, MD;  Location: [facility] INVASIVE CV LAB;  Service: Cardiovascular;  Laterality: N/A;   LEFT HEART CATHETERIZATION WITH CORONARY ANGIOGRAM N/A 08/18/2013   Procedure: LEFT HEART CATHETERIZATION WITH CORONARY ANGIOGRAM;  Surgeon: Peter M Swaziland, MD;  Location: [facility] CATH LAB;  Service: Cardiovascular;  Laterality: N/A;   SHOULDER ARTHROSCOPY W/ ROTATOR CUFF REPAIR Left    No Known Allergies    Objective:    Physical Exam Vitals and nursing note reviewed.  Constitutional:      General: He is not in acute distress.    Appearance: Normal appearance.  HENT:     Head: Normocephalic.  Cardiovascular:     Rate and Rhythm: Normal rate and regular rhythm.  Pulmonary:     Effort: Pulmonary effort is normal.     Breath sounds: Normal breath sounds.  Musculoskeletal:        General: Normal range of motion.     Cervical back: Normal range of motion.  Skin:    General: Skin is warm and dry.  Neurological:     Mental Status: He is alert and oriented to person, place, and time.  Psychiatric:        Mood and Affect: Mood normal.    There were no vitals taken for this visit. Wt Readings from Last 3 Encounters:  01/26/24 (!) 310 lb 9.6 oz (140.9 kg)  01/23/24 (!) 311 lb (141.1 kg)  01/19/24 (!) 313 lb 3.2 oz (142.1 kg)       Versa Gore, NP

## 2024-02-18 ENCOUNTER — Ambulatory Visit (INDEPENDENT_AMBULATORY_CARE_PROVIDER_SITE_OTHER): Payer: Self-pay | Admitting: Family

## 2024-02-18 VITALS — BP 120/72 | HR 93 | Temp 97.7°F | Ht 70.0 in | Wt 309.4 lb

## 2024-02-18 DIAGNOSIS — H00015 Hordeolum externum left lower eyelid: Secondary | ICD-10-CM

## 2024-02-26 ENCOUNTER — Encounter: Payer: Self-pay | Admitting: Family Medicine

## 2024-02-26 ENCOUNTER — Ambulatory Visit: Payer: Self-pay | Admitting: Family Medicine

## 2024-02-26 VITALS — BP 138/82 | HR 115 | Ht 70.0 in

## 2024-02-26 DIAGNOSIS — E119 Type 2 diabetes mellitus without complications: Secondary | ICD-10-CM

## 2024-02-26 DIAGNOSIS — M25512 Pain in left shoulder: Secondary | ICD-10-CM

## 2024-02-26 DIAGNOSIS — M25511 Pain in right shoulder: Secondary | ICD-10-CM

## 2024-02-26 DIAGNOSIS — M25561 Pain in right knee: Secondary | ICD-10-CM

## 2024-02-26 DIAGNOSIS — I10 Essential (primary) hypertension: Secondary | ICD-10-CM

## 2024-02-26 DIAGNOSIS — G8929 Other chronic pain: Secondary | ICD-10-CM

## 2024-02-26 NOTE — Progress Notes (Signed)
 Patient was scheduled today for consideration of an injection in his right knee.  Its only been a month and I cannot do an injection.  Next steps would typically be MRI or physical therapy.  He currently does not have health insurance and cannot afford these.  He is in the process of applying for [facility] health charity.  I think he may also be eligible for Medicaid.  I will contact his PCP.  No charge for today's visit.  Okay to schedule at the end of July for steroid injection right knee if needed.

## 2024-02-26 NOTE — Patient Instructions (Addendum)
 Thank you for coming in today.   Can come back the end of July for an injection if needed.   Look at applying for Medicaid and the [facility].

## 2024-02-26 NOTE — Addendum Note (Signed)
 Addended by: Almira Jaeger on: 02/26/2024 09:17 PM   Modules accepted: Orders

## 2024-02-27 ENCOUNTER — Telehealth: Payer: Self-pay | Admitting: *Deleted

## 2024-02-27 NOTE — Progress Notes (Signed)
 Complex Care Management Note Care Guide Note  02/27/2024 Name: Gabriel [name] MRN: [id number] DOB: [birth year]   Complex Care Management Outreach Attempts: An unsuccessful telephone outreach was attempted today to offer the patient information about available complex care management services.  Follow Up Plan:  Additional outreach attempts will be made to offer the patient complex care management information and services.   Encounter Outcome:  No Answer  Kandis Ormond, CMA [place]  [facility], [facility] Guide Direct Dial: [phone]  Fax: [phone] Website: [email or web address]

## 2024-02-27 NOTE — Progress Notes (Signed)
 Complex Care Management Note  Care Guide Note 02/27/2024 Name: KADDEN OSTERHOUT MRN: [id number] DOB: 03-25-1966  EMILO GRAS is a 58 y.o. year old male who sees Arlene Ben, Saverio Curling, MD for primary care. I reached out to Terance Felt by phone today to offer complex care management services.  Mr. Kittelson was given information about Complex Care Management services today including:   The Complex Care Management services include support from the care team which includes your Nurse Care Manager, Clinical Social Worker, or Pharmacist.  The Complex Care Management team is here to help remove barriers to the health concerns and goals most important to you. Complex Care Management services are voluntary, and the patient may decline or stop services at any time by request to their care team member.   Complex Care Management Consent Status: Patient agreed to services and verbal consent obtained.   Follow up plan:  Telephone appointment with complex care management team member scheduled for:  03/01/2024  Encounter Outcome:  Patient Scheduled  Kandis Ormond, CMA [place]  [facility], [facility] Guide Direct Dial: [phone]  Fax: [phone] Website: [email or web address]

## 2024-03-01 ENCOUNTER — Other Ambulatory Visit: Payer: Self-pay

## 2024-03-01 NOTE — Patient Instructions (Signed)
 Visit Information  Thank you for taking time to visit with me today. Please don't hesitate to contact me if I can be of assistance to you before our next scheduled appointment.  Our next appointment is by telephone on 04/14/24 at 1pm Please call the care guide team at [phone] if you need to cancel or reschedule your appointment.   Following is a copy of your care plan:   Goals Addressed             This Visit's Progress    BSW-VBCI Social Work Care Plan       Problems:   Medicaid coverage  CSW Clinical Goal(s):   Over the next 45 days the Patient will work with Medicaid to address needs related to insurance.  Interventions:  Social Determinants of Health in Patient with DMII and HTN: SDOH assessments completed: Medicaid Evaluation of current treatment plan related to unmet needs Referral to Department of Social Services apply online for Medicaid  Patient Goals/Self-Care Activities:  Geneticist, molecular of Social Services online to apply for Medicaid.  Plan:   Telephone follow up appointment with care management team member scheduled for:  04/14/24 at 1pm.        Please call 911 if you are experiencing a Mental Health or Behavioral Health Crisis or need someone to talk to.  Patient verbalizes understanding of instructions and care plan provided today and agrees to view in MyChart. Active MyChart status and patient understanding of how to access instructions and care plan via MyChart confirmed with patient.     Tillman Gardener, BSW [place]  [facility], [facility] Social Worker Direct Dial: [phone]  Fax: [phone] Website: [email or web address]

## 2024-03-01 NOTE — Patient Outreach (Signed)
 Complex Care Management   Visit Note  03/01/2024  Name:  Gabriel [name] MRN: [id number] DOB: December 09, 1965  Situation: Referral received for Complex Care Management related to SDOH Barriers:  Medicaid coverage I obtained verbal consent from Patient.  Visit completed with patient  on the phone  Background:   Past Medical History:  Diagnosis Date   Arthritis    joints; from where I've had surgeries (08/17/2013)   Chest pain    a. 08/2013 Cardia CTA: Ca score 238 (95%), LM nl, LAD mod stenosis in D2 area, D1 mild ost stenosis, D2 no signif dzs, LCX small, RCA nl.   Coronary artery disease    LHC (08/18/13):  pLAD 50, CFX and RCA normal.  EF 55-65%.  LAD lesion FFR:  0.88 (normal).  Med rx recommended.     Dysphagia    Upper endoscopy 11/2013 - without obvious stricture s/p Maloney dilation.    GERD (gastroesophageal reflux disease)    Hyperlipidemia    a. Dx 3y ago - not on statin.   Hypertension    a. Dx 3y ago.   Hypothyroidism    a. on replacement.   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  Patient has no income and is supported by his father.  Patient does not have insurance and has not been able to get help from the Market[employer].  SW provided online link to apply online.  Patient feels confident he can complete online application. Patient doesn't feel that he needs foodstamps.  If patient is not able to obtain Medicaid, Sw will provide indigent clinic information for care.  SDOH Interventions    Flowsheet Row Patient Outreach Telephone from 03/01/2024 in [place] [facility] Office Visit from 03/31/2023 in [facility] [place] [facility] Visit from 11/19/2022 in [facility] [place] Telephone from 01/01/2022 in [facility]  SDOH Interventions      Food Insecurity Interventions Intervention Not Indicated -- -- Intervention Not Indicated  Housing Interventions Intervention Not Indicated -- -- Intervention Not Indicated  Transportation Interventions Intervention Not Indicated  [Has a car] -- -- Intervention Not Indicated  Utilities Interventions Intervention Not Indicated -- -- --  Depression Interventions/Treatment  -- Counseling Counseling --  Financial Strain Interventions Intervention Not Indicated  [Father provides all support] -- -- Other (Comment)  [mailed [facility] Financial Assistance and [facility],  pt father covers most bills]      Recommendation:   none  Follow Up Plan:   Telephone follow up appointment date/time:  04/14/24 at 1pm  Tillman Gardener, BSW   [facility], [facility] Social Worker Direct Dial: [phone]  Fax: [phone] Website: [email or web address]

## 2024-03-15 NOTE — Progress Notes (Unsigned)
 Ben Jackson D.CLEMENTEEN [facility] [address] [place] [address] Phone: [phone]   Assessment and Plan:     There are no diagnoses linked to this encounter.  ***   Pertinent previous records reviewed include ***    Follow Up: ***     Subjective:   I, Kamyrah Feeser, am serving as a Neurosurgeon for Doctor Morene Mace  Chief Complaint: knee and shoulder pain   HPI:   03/16/2024 Patient is a 58 year old male with knee and shoulder pain. Patient states   Relevant Historical Information: ***  Additional pertinent review of systems negative.   Current Outpatient Medications:    acetaminophen  (TYLENOL ) 500 MG tablet, Take 1,000 mg by mouth every 6 (six) hours as needed for mild pain or moderate pain. Reported on 01/09/2016, Disp: , Rfl:    aspirin  EC 81 MG EC tablet, Take 1 tablet (81 mg total) by mouth daily., Disp: , Rfl:    glimepiride  (AMARYL ) 4 MG tablet, TAKE 2 TABLETS BY MOUTH ONCE DAILY BEFORE BREAKFAST, Disp: 180 tablet, Rfl: 0   glucose blood (ONE TOUCH TEST STRIPS) test strip, Up to 4 times daily.  Onetouch Verio.  E11.65, Disp: 100 each, Rfl: 12   insulin  NPH-regular Human (70-30) 100 UNIT/ML injection, Inject 10-30 Units into the skin 2 (two) times daily with a meal. Start with 20 units am and 10 units in the PM and update me after a week, Disp: 10 mL, Rfl: 11   Lancets (ONETOUCH ULTRASOFT) lancets, Up to 4 times daily.  Onetouch Verio.  DxE11.65, Disp: 100 each, Rfl: 12   levothyroxine  (SYNTHROID ) 125 MCG tablet, TAKE 1 TABLET BY MOUTH ONCE DAILY BEFORE  BREAKFAST, Disp: 90 tablet, Rfl: 0   losartan  (COZAAR ) 100 MG tablet, Take 1 tablet (100 mg total) by mouth daily., Disp: 90 tablet, Rfl: 3   metFORMIN  (GLUCOPHAGE ) 1000 MG tablet, TAKE 1 TABLET BY MOUTH TWICE DAILY WITH A MEAL, Disp: 180 tablet, Rfl: 0   Multiple Vitamins-Minerals (MULTIVITAMIN,TX-MINERALS) tablet, Take 1 tablet by mouth daily., Disp: , Rfl:    nitrofurantoin ,  macrocrystal-monohydrate, (MACROBID ) 100 MG capsule, Take 1 capsule (100 mg total) by mouth 2 (two) times daily., Disp: 14 capsule, Rfl: 0   nitroGLYCERIN  (NITROSTAT ) 0.4 MG SL tablet, Place 1 tablet (0.4 mg total) under the tongue every 5 (five) minutes as needed for chest pain (CP or SOB)., Disp: 25 tablet, Rfl: 3   Omega-3 Fatty Acids (FISH OIL) 1000 MG CAPS, Take 1,000 mg by mouth every other day., Disp: , Rfl:    omeprazole  (PRILOSEC) 20 MG capsule, Take 1 capsule (20 mg total) by mouth daily., Disp: 30 capsule, Rfl: 3   rosuvastatin  (CRESTOR ) 5 MG tablet, Take 1 tablet by mouth once daily, Disp: 90 tablet, Rfl: 0   Semaglutide,0.25 or 0.5MG /DOS, (OZEMPIC, 0.25 OR 0.5 MG/DOSE,) 2 MG/3ML SOPN, 0.25 mg x 4 weeks, then increase to 0.5 mg weekly., Disp: , Rfl:    tamsulosin  (FLOMAX ) 0.4 MG CAPS capsule, Take 1 capsule (0.4 mg total) by mouth daily., Disp: 90 capsule, Rfl: 3   tiZANidine  (ZANAFLEX ) 4 MG tablet, Take 1 tablet (4 mg total) by mouth every 8 (eight) hours as needed for muscle spasms (do not drive for 8 hours after taking)., Disp: 30 tablet, Rfl: 0   Objective:     There were no vitals filed for this visit.    There is no height or weight on file to calculate BMI.  Physical Exam:    ***   Electronically signed by:  Odis Mace D.[facility] 7:36 AM 03/15/24

## 2024-03-16 ENCOUNTER — Ambulatory Visit: Payer: Self-pay | Admitting: Sports Medicine

## 2024-03-16 ENCOUNTER — Telehealth: Payer: Self-pay | Admitting: Sports Medicine

## 2024-03-16 VITALS — HR 115 | Ht 70.0 in | Wt 310.0 lb

## 2024-03-16 DIAGNOSIS — M1711 Unilateral primary osteoarthritis, right knee: Secondary | ICD-10-CM

## 2024-03-16 DIAGNOSIS — M25561 Pain in right knee: Secondary | ICD-10-CM

## 2024-03-16 DIAGNOSIS — M25511 Pain in right shoulder: Secondary | ICD-10-CM

## 2024-03-16 DIAGNOSIS — G8929 Other chronic pain: Secondary | ICD-10-CM

## 2024-03-16 DIAGNOSIS — M25512 Pain in left shoulder: Secondary | ICD-10-CM

## 2024-03-16 MED ORDER — MELOXICAM 15 MG PO TABS: 15.0000 mg | ORAL_TABLET | Freq: Every day | ORAL | 0 refills | Status: DC

## 2024-03-16 NOTE — Patient Instructions (Addendum)
-   Start meloxicam  15 mg daily x2 weeks.  If still having pain after 2 weeks, complete 3rd-week of NSAID. May use remaining NSAID as needed once daily for pain control.  Do not to use additional over-the-counter NSAIDs (ibuprofen , naproxen, Advil , Aleve, etc.) while taking prescription NSAIDs.  May use Tylenol  [phone] mg 2 to 3 times a day for breakthrough pain. Knee and shoulder HEP  4 week follow up  R knee zilretta

## 2024-03-17 ENCOUNTER — Telehealth: Payer: Self-pay

## 2024-03-17 NOTE — Telephone Encounter (Signed)
 The codes generally used for this visit: J3304 (Zilretta ) - $1,248 20610 (aspir/inj) - $275 99214 (office visit) - $260 Total: $1783.00 [facility] provides at 64% discount which would take the total to: $641.88  Can you confirm that these would be the correct codes so that I can inform the patient?  And if this is too much, what other options would Dr Leonce recommend?

## 2024-03-17 NOTE — Telephone Encounter (Signed)
 Called and spoke to patient. He said that he is going to try the new medication he was given at his last visit and see how that goes.  I will have Zilretta  on hand for his visit in August if needed.

## 2024-03-17 NOTE — Telephone Encounter (Signed)
 Closing encounter on my end

## 2024-03-18 ENCOUNTER — Ambulatory Visit (INDEPENDENT_AMBULATORY_CARE_PROVIDER_SITE_OTHER): Payer: Self-pay | Admitting: Family Medicine

## 2024-03-18 ENCOUNTER — Encounter: Payer: Self-pay | Admitting: Family Medicine

## 2024-03-18 VITALS — BP 129/77 | HR 100 | Temp 98.0°F | Ht 70.0 in | Wt 306.2 lb

## 2024-03-18 DIAGNOSIS — Z7984 Long term (current) use of oral hypoglycemic drugs: Secondary | ICD-10-CM

## 2024-03-18 DIAGNOSIS — E119 Type 2 diabetes mellitus without complications: Secondary | ICD-10-CM

## 2024-03-18 DIAGNOSIS — E039 Hypothyroidism, unspecified: Secondary | ICD-10-CM

## 2024-03-18 DIAGNOSIS — I10 Essential (primary) hypertension: Secondary | ICD-10-CM

## 2024-03-18 LAB — POCT GLYCOSYLATED HEMOGLOBIN (HGB A1C): Hemoglobin A1C: 8.9 % — AB (ref 4.0–5.6)

## 2024-03-18 NOTE — Progress Notes (Signed)
 Phone [phone] In person visit   Subjective:   Gabriel [name] is a 58 y.o. 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    Priority: Medium    Morbid obesity (HCC) 08/10/2013    Priority: Medium    Essential hypertension 02/06/2011    Priority: Medium    Hypothyroidism 02/06/2011    Priority: Medium    Hyperlipidemia associated with type 2 diabetes mellitus (HCC) 02/06/2011    Priority: Medium    Colon cancer screening 10/27/2018    Priority: Low   Midepigastric pain 09/27/2018    Priority: Low   Change in bowel habits 09/27/2018    Priority: Low   Family hx colonic polyps 09/27/2018    Priority: Low   BRBPR (bright red blood per rectum) 09/27/2018    Priority: Low   Left lateral epicondylitis 03/18/2016    Priority: Low   GERD (gastroesophageal reflux disease) 02/01/2015    Priority: Low   Dysphagia 09/30/2013    Priority: Low   Primary osteoarthritis of right knee 01/27/2024   Aortic atherosclerosis (HCC) 01/30/2023   Dyslipidemia 09/30/2021   Myalgia 03/27/2021   Chest pain of uncertain etiology 10/03/2019    Medications- reviewed and updated Current Outpatient Medications  Medication Sig Dispense Refill   acetaminophen  (TYLENOL ) 500 MG tablet Take 1,000 mg by mouth every 6 (six) hours as needed for mild pain or moderate pain. Reported on 01/09/2016     aspirin  EC 81 MG EC tablet Take 1 tablet (81 mg total) by mouth daily.     glimepiride  (AMARYL ) 4 MG tablet TAKE 2 TABLETS BY MOUTH ONCE DAILY BEFORE BREAKFAST 180 tablet 0   glucose blood (ONE TOUCH TEST STRIPS) test strip Up to 4 times daily.  Onetouch Verio.   E11.65 100 each 12   insulin  NPH-regular Human (70-30) 100 UNIT/ML injection Inject 10-30 Units into the skin 2 (two) times daily with a meal. Start with 20 units am and 10 units in the PM and update me after a week 10 mL 11   Lancets (ONETOUCH ULTRASOFT) lancets Up to 4 times daily.  Onetouch Verio.  DxE11.65 100 each 12   levothyroxine  (SYNTHROID ) 125 MCG tablet TAKE 1 TABLET BY MOUTH ONCE DAILY BEFORE  BREAKFAST 90 tablet 0   losartan  (COZAAR ) 100 MG tablet Take 1 tablet (100 mg total) by mouth daily. 90 tablet 3   meloxicam  (MOBIC ) 15 MG tablet Take 1 tablet (15 mg total) by mouth daily. 30 tablet 0   metFORMIN  (GLUCOPHAGE ) 1000 MG tablet TAKE 1 TABLET BY MOUTH TWICE DAILY WITH A MEAL 180 tablet 0   Multiple Vitamins-Minerals (MULTIVITAMIN,TX-MINERALS) tablet Take 1 tablet by mouth daily.     nitrofurantoin , macrocrystal-monohydrate, (MACROBID ) 100 MG capsule Take 1 capsule (100 mg total) by mouth 2 (two) times daily. 14 capsule 0   nitroGLYCERIN  (NITROSTAT ) 0.4 MG SL tablet Place 1 tablet (0.4 mg total) under the tongue every 5 (five) minutes as needed for chest pain (CP or SOB). 25 tablet 3   Omega-3 Fatty Acids (FISH OIL) 1000 MG CAPS Take 1,000 mg by mouth every other day.     omeprazole  (  PRILOSEC) 20 MG capsule Take 1 capsule (20 mg total) by mouth daily. 30 capsule 3   rosuvastatin  (CRESTOR ) 5 MG tablet Take 1 tablet by mouth once daily 90 tablet 0   Semaglutide,0.25 or 0.5MG /DOS, (OZEMPIC, 0.25 OR 0.5 MG/DOSE,) 2 MG/3ML SOPN 0.25 mg x 4 weeks, then increase to 0.5 mg weekly.     tamsulosin  (FLOMAX ) 0.4 MG CAPS capsule Take 1 capsule (0.4 mg total) by mouth daily. 90 capsule 3   tiZANidine  (ZANAFLEX ) 4 MG tablet Take 1 tablet (4 mg total) by mouth every 8 (eight) hours as needed for muscle spasms (do not drive for 8 hours after taking). 30 tablet 0   No current facility-administered medications for this visit.     Objective:  BP 129/77   Pulse 100   Temp 98 F (36.7 C)   Ht 5' 10  (1.778 m)   Wt (!) 306 lb 3.2 oz (138.9 kg)   SpO2 95%   BMI 43.94 kg/m  Gen: NAD, resting comfortably CV: RRR no murmurs rubs or gallops Lungs: CTAB no crackles, wheeze, rhonchi Abdomen: soft/nontender/nondistended/normal bowel sounds. No rebound or guarding.  Ext: trace edema Skin: warm, dry    Assessment and Plan   #Knee and shoulder pain- working with sports medicine - on meloxicam  but not sure how helpful- want to check CMP next visit -right knee very bothersome lately  # CAD with #hyperlipidemia S: Medication:Aspirin  81 mg, fish oil, rosuvastatin  5 mg daily No chest pain or shortness of breath - prior right sided chest pain went away- wonders if reflux- doing better Lab Results  Component Value Date   CHOL 117 11/19/2022   HDL 36.50 (L) 11/19/2022   LDLCALC 44 11/19/2022   LDLDIRECT 68.0 03/11/2019   TRIG 183.0 (H) 11/19/2022   CHOLHDL 3 11/19/2022   A/P: lipids reasonably controlled- continue current medications . Coronary artery disease continue current medications - asymptomatic   # Diabetes  S: Medication: Glimepiride  8 mg, insulin  NPH 70/30 twice daily 20 units in the morning, 10 units in the p.m., metformin  1000 mg twice daily, had also been on Ozempic but having abdominal cramping and nausea so had to stop for 4 weeks CBGs- lowest in morning 130- max 200-220 later in day, no low blood sugars Exercise and diet- trying to stay off soft drinks, down 9 lbs on our scales Lab Results  Component Value Date   HGBA1C 8.9 (A) 03/18/2024   HGBA1C 8.0 (A) 11/25/2023   HGBA1C 9.9 (H) 08/19/2023  A/P: diabetes with worsening control- he is going to retrial 2.5 mg Ozempic and if tolerates for 3 weeks will retrial 5 mg- if causes stomach issues again we may be stuck at 2.5 mg dose- continue glimepiride , insulin , metformin   #hypertension S: medication: Losartan  100 mg  BP Readings from Last 3 Encounters:  03/18/24 129/77  02/26/24 138/82  02/18/24 120/72  A/P: sinus  congestion continue current medications   #hypothyroidism S: compliant On thyroid  medication-levothyroxine  125 mcg  Lab Results  Component Value Date   TSH 1.80 08/19/2023   A/P:hoping to get insurance and update TSH next visit- continue current medications for now     #History of adenomatous colon polyp-detected 08/22/2020 with Dr. Wilhelmenia with 3-year repeat planned- discussed he is overdue- hoping can get approved for mediacid and then update this    Recommended follow up: Return in about 14 weeks (around 06/24/2024) for followup or sooner if needed.Schedule b4 you leave. Future Appointments  Date Time Provider Department Center  04/13/2024  3:30 PM Leonce Katz, DO [facility] None  04/14/2024  1:00 PM Clemons, Tillman B [facility] None    Lab/Order associations:   ICD-10-CM   1. Diabetes mellitus without complication (HCC)  E11.9 POCT HgB A1C    2. Essential hypertension  I10     3. Hypothyroidism, unspecified type  E03.9       No orders of the defined types were placed in this encounter.   Return precautions advised.  Garnette Lukes, MD

## 2024-03-18 NOTE — Patient Instructions (Addendum)
 diabetes with worsening control- he is going to retrial 2.5 mg Ozempic and if tolerates for 3 weeks will retrial 5 mg- if causes stomach issues again we may be stuck at 2.5 mg dose- continue glimepiride , insulin , metformin   Due for colonoscopy if we can get the medicaid set up   Recommended follow up: Return in about 14 weeks (around 06/24/2024) for followup or sooner if needed.Schedule b4 you leave.

## 2024-03-26 ENCOUNTER — Ambulatory Visit: Payer: Self-pay | Admitting: Family Medicine

## 2024-03-29 NOTE — Telephone Encounter (Signed)
 See below.  Copied from CRM [id number]. Topic: Clinical - Medication Question >> Mar 26, 2024  3:00 PM Chiquita SQUIBB wrote: Reason for CRM: Patient is calling in stating that he has been having some joint issues and was told about a vitamin for D3. Patient is asking if he can take this with the medications he is on. Please advise the patient.

## 2024-03-29 NOTE — Telephone Encounter (Signed)
 He can take vitamin D3-what dosage is he considering?

## 2024-03-30 NOTE — Telephone Encounter (Signed)
 Called and lm on pt vm with below message. When pt cb please get dosage of D3.

## 2024-04-08 ENCOUNTER — Ambulatory Visit: Payer: Self-pay

## 2024-04-08 NOTE — Telephone Encounter (Signed)
 FYI Only or Action Required?: FYI only for provider.  Patient was last seen in primary care on 03/18/2024 by Katrinka Garnette KIDD, MD.  Called Nurse Triage reporting Insect Bite.  Symptoms began several weeks ago.  Interventions attempted: OTC medications: Benadryl, Tylenol .  Symptoms are: stable.  Triage Disposition: See PCP When Office is Open (Within 3 Days)  Patient/caregiver understands and will follow disposition?: Yes       Copied from CRM [id number]. Topic: Clinical - Red Word Triage >> Apr 08, 2024  4:40 PM Donee H wrote: Red Word that prompted transfer to Nurse Triage: Patient experiencing pain in left shoulder and fingers(index and thumb) are swollen. Patient described pain in shoulder has a stabbing pain feeling.He stated was stung by a yellowjacket about 2 weeks ago. Also stated in certain areas of his arm is puffy. Reason for Disposition  [1] Hives, itching, or swelling elsewhere on body (i.e., not a site of stings) AND [2] started over 2 hours after sting  (Exception: Only at site of sting.)  Answer Assessment - Initial Assessment Questions Patient says he got stung by yellow jackets that were in the ground when he was mowing about 3 weeks ago to the left wrist above the watch band. He says there was swelling to the wrist initially and he was taking Benadryl and Tylenol . He says the swelling went down some, but not completely. He says he's having pain in the left shoulder/bicep, but says he's been having issues with the shoulder. The swelling is still to the forearm near the wrist and he says he can't grip with the fingers on the left hand since the sting. Advised Dr. Katrinka doesn't have availability, he agreed to see any provider, scheduled tomorrow with Dr. Wendolyn.   1. TYPE: What type of sting was it? (e.g., bee, yellow jacket, unknown)      Yellow jacket 2. ONSET: When did it occur?      About 3 weeks ago 3. LOCATION: Where is the sting located?  How many  stings?     Left wrist, 1-2 stings 4. SWELLING SIZE: How big is the swelling? (e.g., inches or cm)     Dime size whelp, a little puffy at this time forearm, index finger, around wrist where watch is worn 5. REDNESS: Is the area red or pink? If Yes, ask: What size is area of redness? (e.g., inches or cm). When did the redness start?     No 6. PAIN: Is there any pain? If Yes, ask: How bad is it?  (Scale 0-10; or none, mild, moderate, severe)     With movement 8 to the left bicep and shoulder 7. ITCHING: Is there any itching? If Yes, ask: How bad is it?      No 8. RESPIRATORY DISTRESS: Describe your breathing.     No 9. PRIOR REACTIONS: Have you had any severe allergic reactions to stings in the past? If Yes, ask: What happened?     No 10. OTHER SYMPTOMS: Do you have any other symptoms? (e.g., abdomen pain, face or tongue swelling, new rash elsewhere, vomiting)       Left shoulder pain/bicep pain  Protocols used: Bee or Yellow Jacket Sting-A-AH

## 2024-04-09 ENCOUNTER — Ambulatory Visit (INDEPENDENT_AMBULATORY_CARE_PROVIDER_SITE_OTHER): Payer: Self-pay | Admitting: Family Medicine

## 2024-04-09 ENCOUNTER — Encounter: Payer: Self-pay | Admitting: Family Medicine

## 2024-04-09 VITALS — BP 124/77 | HR 105 | Temp 97.7°F | Resp 18 | Ht 70.0 in | Wt 299.2 lb

## 2024-04-09 DIAGNOSIS — M25511 Pain in right shoulder: Secondary | ICD-10-CM

## 2024-04-09 DIAGNOSIS — E669 Obesity, unspecified: Secondary | ICD-10-CM

## 2024-04-09 DIAGNOSIS — M25512 Pain in left shoulder: Secondary | ICD-10-CM

## 2024-04-09 DIAGNOSIS — E1165 Type 2 diabetes mellitus with hyperglycemia: Secondary | ICD-10-CM

## 2024-04-09 DIAGNOSIS — G8929 Other chronic pain: Secondary | ICD-10-CM

## 2024-04-09 DIAGNOSIS — M79642 Pain in left hand: Secondary | ICD-10-CM

## 2024-04-09 DIAGNOSIS — M25532 Pain in left wrist: Secondary | ICD-10-CM

## 2024-04-09 DIAGNOSIS — M2550 Pain in unspecified joint: Secondary | ICD-10-CM

## 2024-04-09 LAB — CK: Total CK: 59 U/L (ref 23–325)

## 2024-04-09 NOTE — Telephone Encounter (Signed)
 Appt today

## 2024-04-09 NOTE — Progress Notes (Signed)
 Subjective:     Patient ID: Gabriel [name], male    DOB: 03-17-1966, 58 y.o.   MRN: [id number]  Chief Complaint  Patient presents with   Arm Swelling    Left arm swelling that started 3 weeks ago after yellow jacket sting, having pain in wrist, having trouble gripping     HPI Discussed the use of AI scribe software for clinical note transcription with the patient, who gave verbal consent to proceed.  History of Present Illness Gabriel [name] is a 58 year old male with diabetes who presents with L hand and shoulder pain following a bee sting.  He was stung by a bee on his left wrist approximately three to four weeks ago, leading to significant swelling that obscured his knuckles when bending his hand. The swelling subsided after four to five days. Recently, he has experienced pain and tightness in his left middle,index finger, thumb, and wrist, which began two days ago. He describes difficulty bending his fingers and a sensation of 'stoving' in his hand, with pain in his index and middle fingers when moved.  He has been experiencing significant shoulder pain for over a month, particularly in the left shoulder, described as 'sharp pain' with certain movements. He has a history of a rotator cuff repair on the left shoulder about fifteen years ago. While his right shoulder has improved, the left remains problematic. He suspects a possible neck issue due to a known bulging disc. He also reports generalized joint pain, including in his knees and lower back, and has started taking vitamin D3 to address these symptoms. He is seeing sports med as well.  His diabetes is not well controlled, with a recent A1c of 8.9. He has been attempting to manage his weight and has lost approximately 18 pounds, now weighing around 299 pounds. He previously stopped taking semaglutide due to nausea at higher doses and is currently not on it. He is on metformin  for diabetes management.  He has a history of  sciatic nerve issues that flare up annually around February to March and subside by June. 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  He is currently taking meloxicam  once daily for joint pain but reports limited relief. He has previously tried stopping rosuvastatin  to see if it was contributing to his symptoms, with minimal improvement. He has also been using [facility] for symptomatic relief of his shoulder pain.  No fever or chills. He feels tired and exhausted and notes difficulty with activities due to pain and stiffness, particularly in his hands and shoulders.    Health Maintenance Due  Topic Date Due   Hepatitis B Vaccines (1 of 3 - 19+ 3-dose series) Never done   OPHTHALMOLOGY EXAM  02/13/2021   Colonoscopy  08/23/2023    Past Medical History:  Diagnosis Date   Arthritis    joints; from where I've had surgeries (08/17/2013)   Chest pain    a. 08/2013 Cardia CTA: Ca score 238 (95%), LM nl, LAD mod stenosis in D2 area, D1 mild ost stenosis, D2 no signif dzs, LCX small, RCA nl.   Coronary artery disease    LHC (08/18/13):  pLAD 50, CFX and RCA normal.  EF 55-65%.  LAD lesion FFR:  0.88 (normal).  Med rx recommended.     Dysphagia    Upper endoscopy 11/2013 - without obvious stricture s/p Maloney dilation.    GERD (gastroesophageal reflux  disease)    Hyperlipidemia    a. Dx 3y ago - not on statin.   Hypertension    a. Dx 3y ago.   Hypothyroidism    a. on replacement.   IBS (irritable bowel syndrome)    colonoscopy 2000   Obesity    Sleep apnea    does not use CPAP   Snoring    Type II diabetes mellitus (HCC)     Past Surgical History:  Procedure Laterality Date   ANKLE SURGERY Right    had a growth in it; cut it out (08/17/2013)   CARDIAC CATHETERIZATION N/A 09/28/2015   Procedure: Left Heart Cath and Coronary Angiography;  Surgeon: Peter M Swaziland, MD;  Location: [facility] INVASIVE CV LAB;  Service:  Cardiovascular;  Laterality: N/A;   CHOLECYSTECTOMY     CORONARY PRESSURE/FFR STUDY N/A 10/04/2019   Procedure: INTRAVASCULAR PRESSURE WIRE/FFR STUDY;  Surgeon: Dann Candyce RAMAN, MD;  Location: [facility] INVASIVE CV LAB;  Service: Cardiovascular;  Laterality: N/A;   CORONARY STENT INTERVENTION N/A 10/04/2019   Procedure: CORONARY STENT INTERVENTION;  Surgeon: Dann Candyce RAMAN, MD;  Location: [facility] INVASIVE CV LAB;  Service: Cardiovascular;  Laterality: N/A;   KNEE ARTHROSCOPY Right X 2   LEFT HEART CATH AND CORONARY ANGIOGRAPHY N/A 10/04/2019   Procedure: LEFT HEART CATH AND CORONARY ANGIOGRAPHY;  Surgeon: Dann Candyce RAMAN, MD;  Location: [facility] INVASIVE CV LAB;  Service: Cardiovascular;  Laterality: N/A;   LEFT HEART CATHETERIZATION WITH CORONARY ANGIOGRAM N/A 08/18/2013   Procedure: LEFT HEART CATHETERIZATION WITH CORONARY ANGIOGRAM;  Surgeon: Peter M Swaziland, MD;  Location: [facility] CATH LAB;  Service: Cardiovascular;  Laterality: N/A;   SHOULDER ARTHROSCOPY W/ ROTATOR CUFF REPAIR Left      Current Outpatient Medications:    acetaminophen  (TYLENOL ) 500 MG tablet, Take 1,000 mg by mouth every 6 (six) hours as needed for mild pain or moderate pain. Reported on 01/09/2016, Disp: , Rfl:    aspirin  EC 81 MG EC tablet, Take 1 tablet (81 mg total) by mouth daily., Disp: , Rfl:    glimepiride  (AMARYL ) 4 MG tablet, TAKE 2 TABLETS BY MOUTH ONCE DAILY BEFORE BREAKFAST, Disp: 180 tablet, Rfl: 0   glucose blood (ONE TOUCH TEST STRIPS) test strip, Up to 4 times daily.  Onetouch Verio.  E11.65, Disp: 100 each, Rfl: 12   insulin  NPH-regular Human (70-30) 100 UNIT/ML injection, Inject 10-30 Units into the skin 2 (two) times daily with a meal. Start with 20 units am and 10 units in the PM and update me after a week, Disp: 10 mL, Rfl: 11   Lancets (ONETOUCH ULTRASOFT) lancets, Up to 4 times daily.  Onetouch Verio.  DxE11.65, Disp: 100 each, Rfl: 12   levothyroxine  (SYNTHROID ) 125 MCG tablet, TAKE 1 TABLET BY MOUTH ONCE  DAILY BEFORE  BREAKFAST, Disp: 90 tablet, Rfl: 0   losartan  (COZAAR ) 100 MG tablet, Take 1 tablet (100 mg total) by mouth daily., Disp: 90 tablet, Rfl: 3   meloxicam  (MOBIC ) 15 MG tablet, Take 1 tablet (15 mg total) by mouth daily., Disp: 30 tablet, Rfl: 0   metFORMIN  (GLUCOPHAGE ) 1000 MG tablet, TAKE 1 TABLET BY MOUTH TWICE DAILY WITH A MEAL, Disp: 180 tablet, Rfl: 0   Multiple Vitamins-Minerals (MULTIVITAMIN,TX-MINERALS) tablet, Take 1 tablet by mouth daily., Disp: , Rfl:    nitrofurantoin , macrocrystal-monohydrate, (MACROBID ) 100 MG capsule, Take 1 capsule (100 mg total) by mouth 2 (two) times daily., Disp: 14 capsule, Rfl: 0   nitroGLYCERIN  (NITROSTAT ) 0.4 MG SL tablet, Place  1 tablet (0.4 mg total) under the tongue every 5 (five) minutes as needed for chest pain (CP or SOB)., Disp: 25 tablet, Rfl: 3   Omega-3 Fatty Acids (FISH OIL) 1000 MG CAPS, Take 1,000 mg by mouth every other day., Disp: , Rfl:    omeprazole  (PRILOSEC) 20 MG capsule, Take 1 capsule (20 mg total) by mouth daily., Disp: 30 capsule, Rfl: 3   rosuvastatin  (CRESTOR ) 5 MG tablet, Take 1 tablet by mouth once daily, Disp: 90 tablet, Rfl: 0   Semaglutide,0.25 or 0.5MG /DOS, (OZEMPIC, 0.25 OR 0.5 MG/DOSE,) 2 MG/3ML SOPN, 0.25 mg x 4 weeks, then increase to 0.5 mg weekly., Disp: , Rfl:    tamsulosin  (FLOMAX ) 0.4 MG CAPS capsule, Take 1 capsule (0.4 mg total) by mouth daily., Disp: 90 capsule, Rfl: 3   tiZANidine  (ZANAFLEX ) 4 MG tablet, Take 1 tablet (4 mg total) by mouth every 8 (eight) hours as needed for muscle spasms (do not drive for 8 hours after taking)., Disp: 30 tablet, Rfl: 0  No Known Allergies ROS neg/noncontributory except as noted HPI/below      Objective:     BP 124/77   Pulse (!) 105   Temp 97.7 F (36.5 C) (Temporal)   Resp 18   Ht 5' 10 (1.778 m)   Wt 299 lb 4 oz (135.7 kg)   SpO2 94%   BMI 42.94 kg/m  Wt Readings from Last 3 Encounters:  04/09/24 299 lb 4 oz (135.7 kg)  03/18/24 (!) 306 lb 3.2 oz  (138.9 kg)  03/16/24 (!) 310 lb (140.6 kg)    Physical Exam   Gen: WDWN NAD HEENT: NCAT, conjunctiva not injected, sclera nonicteric CARDIAC: RRR, S1S2+ EXT:  no edema MSK: no gross abnormalities.  Tinels sl + on L.  No swelling of arm.  Can see site of bee sting but nothing occurring now.  Can touch all fingers but not quite thumb to pinkie B.  L hand-cannot fully flex dip, pip index finger.  Some tight muscles L shoulder.   NEURO: A&O x3.  CN II-XII intact.  PSYCH: normal mood. Good eye contact     Assessment & Plan:  Pain of left hand  Polyarthralgia -     CBC with Differential/Platelet -     COMPLETE METABOLIC PANEL WITHOUT GFR -     TSH -     Rheumatoid factor -     Sedimentation rate -     C-reactive protein -     VITAMIN D  25 Hydroxy (Vit-D Deficiency, Fractures) -     Vitamin B12 -     CK -     ANA  Assessment and Plan Assessment & Plan Left wrist and hand pain with possible nerve impingement   Pain in the left wrist, index finger, and thumb began 2 days ago, causing difficulty bending and gripping. Possible nerve impingement is suspected, potentially related to carpal tunnel syndrome or originating from the neck or shoulder. A bee sting from 3-4 weeks ago is unlikely to be the cause. Order blood tests including Rheumatoid factor, CRP, CMP, and CPK to rule out autoimmune causes. Recommend wearing a wrist splint on the left wrist to limit movement and reduce stiffness. Refer to Doctor Leonce for further evaluation, possibly including a nerve conduction study to determine the source of nerve impingement. 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 Pain is exacerbated by certain movements, suggesting possible impingement or rotator cuff pathology. Plan for cortisone injection in the left shoulder during the next  visit with Doctor Leonce. Continue current management with meloxicam .  Chronic right knee pain and swelling   Chronic pain and swelling in the right knee limit range of motion. A previous cortisone injection provided limited relief. Continue meloxicam  for pain management.   Generalized joint pain and stiffness   Generalized joint pain and stiffness affect multiple joints, including knees, shoulders, and back. Symptoms suggest possible inflammatory arthritis or other systemic condition. A previous trial of stopping rosuvastatin  did not resolve symptoms. Order blood tests including ANA to evaluate for autoimmune conditions. Recommend trial of anti-inflammatory diet and elimination of NutraSweet (aspartame) to reduce joint inflammation. Suggest adding turmeric supplements as an anti-inflammatory measure.  Type 2 diabetes mellitus, poorly controlled   Diabetes is poorly controlled with a recent A1c of 8.9. Previous attempts to manage with semaglutide (Ozempic) were halted due to side effects at higher doses. Weight loss of 18 pounds is noted, but further management is needed. Discuss with Doctor Katrinka the possibility of restarting semaglutide at a lower dose or considering alternative medications like Mounjaro. Continue efforts to lose weight and manage diet to improve glycemic control.  Obesity   Current weight is approximately 299 pounds, with a recent weight loss of 18 pounds. Continued weight loss is a goal to improve overall health and diabetes management. Encourage continued weight loss efforts through dietary changes and increased physical activity as tolerated. Monitor weight and adjust management plan as needed.    Return if symptoms worsen or fail to improve.  Jenkins CHRISTELLA Carrel, MD

## 2024-04-10 ENCOUNTER — Encounter: Payer: Self-pay | Admitting: Family Medicine

## 2024-04-10 LAB — CBC WITH DIFFERENTIAL/PLATELET
Absolute Lymphocytes: 1930 {cells}/uL (ref 850–3900)
Absolute Monocytes: 441 {cells}/uL (ref 200–950)
Basophils Absolute: 38 {cells}/uL (ref 0–200)
Basophils Relative: 0.5 %
Eosinophils Absolute: 152 {cells}/uL (ref 15–500)
Eosinophils Relative: 2 %
HCT: 41.5 % (ref 38.5–50.0)
Hemoglobin: 12.9 g/dL — ABNORMAL LOW (ref 13.2–17.1)
MCH: 27.4 pg (ref 27.0–33.0)
MCHC: 31.1 g/dL — ABNORMAL LOW (ref 32.0–36.0)
MCV: 88.3 fL (ref 80.0–100.0)
MPV: 11.6 fL (ref 7.5–12.5)
Monocytes Relative: 5.8 %
Neutro Abs: 5039 {cells}/uL (ref 1500–7800)
Neutrophils Relative %: 66.3 %
Platelets: 322 Thousand/uL (ref 140–400)
RBC: 4.7 Million/uL (ref 4.20–5.80)
RDW: 12.3 % (ref 11.0–15.0)
Total Lymphocyte: 25.4 %
WBC: 7.6 Thousand/uL (ref 3.8–10.8)

## 2024-04-10 LAB — COMPLETE METABOLIC PANEL WITHOUT GFR
AG Ratio: 1.6 (calc) (ref 1.0–2.5)
ALT: 22 U/L (ref 9–46)
AST: 16 U/L (ref 10–35)
Albumin: 4.2 g/dL (ref 3.6–5.1)
Alkaline phosphatase (APISO): 80 U/L (ref 35–144)
BUN: 12 mg/dL (ref 7–25)
CO2: 28 mmol/L (ref 20–32)
Calcium: 9.4 mg/dL (ref 8.6–10.3)
Chloride: 97 mmol/L — ABNORMAL LOW (ref 98–110)
Creat: 0.74 mg/dL (ref 0.70–1.30)
Globulin: 2.6 g/dL (ref 1.9–3.7)
Glucose, Bld: 277 mg/dL — ABNORMAL HIGH (ref 65–99)
Potassium: 4.4 mmol/L (ref 3.5–5.3)
Sodium: 135 mmol/L (ref 135–146)
Total Bilirubin: 0.7 mg/dL (ref 0.2–1.2)
Total Protein: 6.8 g/dL (ref 6.1–8.1)

## 2024-04-10 LAB — RHEUMATOID FACTOR: Rheumatoid fact SerPl-aCnc: <10 [IU]/mL (ref <14)

## 2024-04-10 LAB — SEDIMENTATION RATE: Sed Rate: 29 mm/h — ABNORMAL HIGH (ref 0–20)

## 2024-04-10 LAB — VITAMIN B12: Vitamin B-12: 461 pg/mL (ref 200–1100)

## 2024-04-10 LAB — TSH: TSH: 2.76 m[IU]/L (ref 0.40–4.50)

## 2024-04-10 LAB — VITAMIN D 25 HYDROXY (VIT D DEFICIENCY, FRACTURES): Vit D, 25-Hydroxy: 45 ng/mL (ref 30–100)

## 2024-04-10 NOTE — Patient Instructions (Signed)
 Keep follow up w/Dr. Leonce Get wrist splint  Tumeric capsules Icey hot, etc.

## 2024-04-11 ENCOUNTER — Ambulatory Visit: Payer: Self-pay | Admitting: Family Medicine

## 2024-04-11 LAB — ANTI-NUCLEAR AB-TITER (ANA TITER): ANA Titer 1: 1:40 {titer} — ABNORMAL HIGH

## 2024-04-11 LAB — ANA: Anti Nuclear Antibody (ANA): POSITIVE — AB

## 2024-04-11 NOTE — Progress Notes (Signed)
 Gabriel [name] is positive but low.  Could be false +.   Refer rhem-polyarthragia

## 2024-04-11 NOTE — Progress Notes (Signed)
 ANA still pending Labs look ok except Sed rate a little elevated-inflammation-no surprise-but let us  know if generalized weakness Sugar high of course Hgb a little low, but hct normal so possibly lab error.  Should sch appt w/Dr. Katrinka to f/u on this

## 2024-04-12 ENCOUNTER — Other Ambulatory Visit: Payer: Self-pay | Admitting: *Deleted

## 2024-04-12 DIAGNOSIS — M2550 Pain in unspecified joint: Secondary | ICD-10-CM

## 2024-04-13 ENCOUNTER — Encounter: Payer: Self-pay | Admitting: *Deleted

## 2024-04-13 ENCOUNTER — Ambulatory Visit: Payer: Self-pay | Admitting: Sports Medicine

## 2024-04-14 ENCOUNTER — Other Ambulatory Visit: Payer: Self-pay

## 2024-04-14 NOTE — Patient Instructions (Signed)
 Visit Information  Thank you for taking time to visit with me today. Please don't hesitate to contact me if I can be of assistance to you before our next scheduled appointment.  Your next care management appointment is by telephone on 04/19/24 at 1pm   Please call the care guide team at [phone] if you need to cancel, schedule, or reschedule an appointment.   Please call 911 if you are experiencing a Mental Health or Behavioral Health Crisis or need someone to talk to.  Tillman Gardener, BSW [place]  [facility], [facility] Social Worker Direct Dial: [phone]  Fax: [phone] Website: [email or web address]

## 2024-04-14 NOTE — Patient Outreach (Signed)
 Complex Care Management   Visit Note  04/14/2024  Name:  Gabriel [name] MRN: [id number] DOB: 31-May-1966  Situation: Referral received for Complex Care Management related to SDOH Barriers:  Medicaid application I obtained verbal consent from Patient.  Visit completed with patient  on the phone  Background:   Past Medical History:  Diagnosis Date   Arthritis    joints; from where I've had surgeries (08/17/2013)   Chest pain    a. 08/2013 Cardia CTA: Ca score 238 (95%), LM nl, LAD mod stenosis in D2 area, D1 mild ost stenosis, D2 no signif dzs, LCX small, RCA nl.   Coronary artery disease    LHC (08/18/13):  pLAD 50, CFX and RCA normal.  EF 55-65%.  LAD lesion FFR:  0.88 (normal).  Med rx recommended.     Dysphagia    Upper endoscopy 11/2013 - without obvious stricture s/p Maloney dilation.    GERD (gastroesophageal reflux disease)    Hyperlipidemia    a. Dx 3y ago - not on statin.   Hypertension    a. Dx 3y ago.   Hypothyroidism    a. on replacement.   IBS (irritable bowel syndrome)    colonoscopy 2000   Obesity    Sleep apnea    does not use CPAP   Snoring    Type II diabetes mellitus (HCC)     Assessment:  Patient reports he and his father had medical issues that prevented him from applying for Medicaid.  Patient agrees to contact Department of Social Services online to apply for Medicaid.   SDOH Interventions    Flowsheet Row Patient Outreach Telephone from 03/01/2024 in [place] POPULATION HEALTH DEPARTMENT Office Visit from 03/31/2023 in [facility] [place] [facility] [place] [facility] Visit from 11/19/2022 in [facility] [place] Telephone from 01/01/2022 in [facility]  SDOH Interventions      Food Insecurity Interventions Intervention Not Indicated -- -- Intervention Not Indicated  Housing Interventions Intervention Not Indicated -- -- Intervention Not Indicated  Transportation Interventions Intervention Not  Indicated  [Has a car] -- -- Intervention Not Indicated  Utilities Interventions Intervention Not Indicated -- -- --  Depression Interventions/Treatment  -- Counseling Counseling --  Financial Strain Interventions Intervention Not Indicated  [Father provides all support] -- -- Other (Comment)  [mailed [facility] Assistance and [facility]CMed[facility]ist,  pt father covers most bills]      Recommendation:   none  Follow Up Plan:   Telephone follow up appointment date/time:  04/19/24 at 1pm.  Tillman Gardener, BSW [place]  [facility], [facility] Social Worker Direct Dial: [phone]  Fax: [phone] Website: [email or web address]

## 2024-04-19 ENCOUNTER — Other Ambulatory Visit: Payer: Self-pay

## 2024-04-19 NOTE — Patient Instructions (Signed)
 Visit Information  Thank you for taking time to visit with me today. Please don't hesitate to contact me if I can be of assistance to you before our next scheduled appointment.  Your next care management appointment is by telephone on 04/23/24 at 1pm   Please call the care guide team at [phone] if you need to cancel, schedule, or reschedule an appointment.   Please call 911 if you are experiencing a Mental Health or Behavioral Health Crisis or need someone to talk to.  Gabriel [name], BSW [place]  [facility], [facility] Social Worker Direct Dial: [phone]  Fax: [phone] Website: [email or web address]

## 2024-04-19 NOTE — Patient Outreach (Signed)
 Complex Care Management   Visit Note  04/19/2024  Name:  Gabriel [name] MRN: [id number] DOB: 06-Feb-1966  Situation: Referral received for Complex Care Management related to SDOH Barriers:  Medicaid application I obtained verbal consent from Patient.  Visit completed with patient  on the phone  Background:   Past Medical History:  Diagnosis Date   Arthritis    joints; from where I've had surgeries (08/17/2013)   Chest pain    a. 08/2013 Cardia CTA: Ca score 238 (95%), LM nl, LAD mod stenosis in D2 area, D1 mild ost stenosis, D2 no signif dzs, LCX small, RCA nl.   Coronary artery disease    LHC (08/18/13):  pLAD 50, CFX and RCA normal.  EF 55-65%.  LAD lesion FFR:  0.88 (normal).  Med rx recommended.     Dysphagia    Upper endoscopy 11/2013 - without obvious stricture s/p Maloney dilation.    GERD (gastroesophageal reflux disease)    Hyperlipidemia    a. Dx 3y ago - not on statin.   Hypertension    a. Dx 3y ago.   Hypothyroidism    a. on replacement.   IBS (irritable bowel syndrome)    colonoscopy 2000   Obesity    Sleep apnea    does not use CPAP   Snoring    Type II diabetes mellitus (HCC)     Assessment:  Patient reports he started the Medicaid application process but struggles with completing the online application.  SW works with patient and he is able to continue with the application.  Patient will complete and submit within the next 3 days.  SDOH Interventions    Flowsheet Row Patient Outreach Telephone from 03/01/2024 in [place] POPULATION HEALTH DEPARTMENT Office Visit from 03/31/2023 in [facility] Visit from 11/19/2022 in [facility] [place] Telephone from 01/01/2022 in [facility]  SDOH Interventions      Food Insecurity Interventions Intervention Not Indicated -- -- Intervention Not Indicated  Housing Interventions Intervention Not Indicated -- -- Intervention Not  Indicated  Transportation Interventions Intervention Not Indicated  [Has a car] -- -- Intervention Not Indicated  Utilities Interventions Intervention Not Indicated -- -- --  Depression Interventions/Treatment  -- Counseling Counseling --  Financial Strain Interventions Intervention Not Indicated  [Father provides all support] -- -- Other (Comment)  [mailed [facility] Financial Assistance and [facility]ed[facility],  pt father covers most bills]      Recommendation:   none  Follow Up Plan:   Telephone follow up appointment date/time:  04/23/24 at 1pm  Tillman Gardener, BSW [place]  [facility], [facility] Social Worker Direct Dial: [phone]  Fax: [phone] Website: [email or web address]

## 2024-04-20 ENCOUNTER — Other Ambulatory Visit: Payer: Self-pay | Admitting: Family Medicine

## 2024-04-20 ENCOUNTER — Telehealth: Payer: Self-pay | Admitting: Family Medicine

## 2024-04-20 NOTE — Progress Notes (Signed)
 Ben Jaquan Sadowsky D.[facility]MENTEEN [facility] [address] [place] [address] Phone: [phone]   Assessment and Plan:     1. Chronic pain of right knee 2. Primary osteoarthritis of right knee 3. Chronic pain of both shoulders -Chronic with exacerbation, subsequent visit - Overall improvement in multiple areas of musculoskeletal pain including right knee and bilateral shoulders after meloxicam  course.  Unfortunately, patient continues to experience pain when not using NSAIDs.  Still most consistent with flare of mild right knee osteoarthritis and subacromial bursitis versus rotator cuff tendinopathy of bilateral shoulders - Patient's treatment options are limited as patient is uninsured.  Offered physical therapy, intra-articular CSI, intra-articular Zilretta  injection, subacromial CSI, further imaging with ultrasound or MRIs, however unable to pursue these options due to financial burden - Use Tylenol  500 to 1000 mg tablets 2-3 times a day for day-to-day pain relief - Use meloxicam  15 mg daily as needed for pain.  Recommend limiting chronic NSAIDs to 1-2 doses per week to prevent long-term side effects. - Recommend following up with PCP and encouraged him to focus on control of DM type II has poorly controlled DM type II will lead to delayed healing - Encouraged patient to pursue insurance options   Pertinent previous records reviewed include none  Follow Up: As needed if patient would like to pursue additional treatment options   Subjective:   I, Chestine Reeves, am serving as a Neurosurgeon for Doctor Morene Mace   Chief Complaint: knee and shoulder pain    HPI:  01/26/2024 GARMON DEHN is a 59 y.o. male who presents to [facility] today for right knee and right shoulder pain, referred by Lucie Buttner, PA.    Today, patient c/o right shoulder pain x 2+ weeks. 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  C/O limited ROM reaching above 90 degrees, also having weakness/decreased grip strength. Pt locates pain to anterior aspect the the shoulder, radiating into the upper arm. Short-term relief with topical Magnesium.    Pt also c/o right knee pain x 2+ weeks. Pt locates pain to joint line and posterior aspect of the knee. Pt c/o limited ROM and difficulty with WB and ambulation. Has tried IBU, topical Mg, bracing/compression, and ice with no long-term relief. Hx of surgical repair to the right knee x 2, with [facility]. Notes slight swelling in the right leg.    Patient states that he has been taking tylenol  and magnesium roll on as well for the pain. Uses ice and heat to try and get the swelling down. 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 Patient states that it is probably overuse from caring for his father and also past warehouse work.      03/16/2024 Patient is a 58 year old male with knee and shoulder pain. Patient states R knee  would like a CSI but not eligible for one until end of month . Bilat shoulder pain and would like to discuss a CSI bilaterally. Shoulder pain for about 2-3 months. He does lift heavy. No radiating pain. Hx of bulging disc in neck. Ibu and tylenol  for the pain and that doesn't help much. Isnt able to lay on either side due to pain. Icy hot and epsom salt baths have not helped    04/21/2024 Patient states he is feeling better. Still has knee inflammation. Shoulder pain has come down some but not resolved. Seaw chiro yesterday. Was  told he might have gout by chiro    Relevant Historical Information: DM type II, hypertension, GERD, hypothyroidism, CAD  Additional pertinent review of systems negative.   Current Outpatient Medications:    acetaminophen  (TYLENOL ) 500 MG tablet, Take 1,000 mg by mouth every 6 (six) hours as needed for mild pain or moderate pain. Reported on 01/09/2016, Disp: , Rfl:    aspirin  EC 81 MG EC tablet, Take 1 tablet (81 mg  total) by mouth daily., Disp: , Rfl:    glimepiride  (AMARYL ) 4 MG tablet, TAKE 2 TABLETS BY MOUTH ONCE DAILY BEFORE BREAKFAST, Disp: 180 tablet, Rfl: 0   glucose blood (ONE TOUCH TEST STRIPS) test strip, Up to 4 times daily.  Onetouch Verio.  E11.65, Disp: 100 each, Rfl: 12   insulin  NPH-regular Human (70-30) 100 UNIT/ML injection, Inject 10-30 Units into the skin 2 (two) times daily with a meal. Start with 20 units am and 10 units in the PM and update me after a week, Disp: 10 mL, Rfl: 11   Lancets (ONETOUCH ULTRASOFT) lancets, Up to 4 times daily.  Onetouch Verio.  DxE11.65, Disp: 100 each, Rfl: 12   levothyroxine  (SYNTHROID ) 125 MCG tablet, TAKE 1 TABLET BY MOUTH ONCE DAILY BEFORE  BREAKFAST, Disp: 90 tablet, Rfl: 0   losartan  (COZAAR ) 100 MG tablet, Take 1 tablet (100 mg total) by mouth daily., Disp: 90 tablet, Rfl: 3   meloxicam  (MOBIC ) 15 MG tablet, Take 1 tablet (15 mg total) by mouth daily as needed for pain., Disp: 30 tablet, Rfl: 1   metFORMIN  (GLUCOPHAGE ) 1000 MG tablet, TAKE 1 TABLET BY MOUTH TWICE DAILY WITH A MEAL, Disp: 180 tablet, Rfl: 0   Multiple Vitamins-Minerals (MULTIVITAMIN,TX-MINERALS) tablet, Take 1 tablet by mouth daily., Disp: , Rfl:    nitrofurantoin , macrocrystal-monohydrate, (MACROBID ) 100 MG capsule, Take 1 capsule (100 mg total) by mouth 2 (two) times daily., Disp: 14 capsule, Rfl: 0   nitroGLYCERIN  (NITROSTAT ) 0.4 MG SL tablet, Place 1 tablet (0.4 mg total) under the tongue every 5 (five) minutes as needed for chest pain (CP or SOB)., Disp: 25 tablet, Rfl: 3   Omega-3 Fatty Acids (FISH OIL) 1000 MG CAPS, Take 1,000 mg by mouth every other day., Disp: , Rfl:    omeprazole  (PRILOSEC) 20 MG capsule, Take 1 capsule (20 mg total) by mouth daily., Disp: 30 capsule, Rfl: 3   rosuvastatin  (CRESTOR ) 5 MG tablet, Take 1 tablet by mouth once daily, Disp: 90 tablet, Rfl: 0   Semaglutide,0.25 or 0.5MG /DOS, (OZEMPIC, 0.25 OR 0.5 MG/DOSE,) 2 MG/3ML SOPN, 0.25 mg x 4 weeks, then  increase to 0.5 mg weekly., Disp: , Rfl:    tamsulosin  (FLOMAX ) 0.4 MG CAPS capsule, Take 1 capsule (0.4 mg total) by mouth daily., Disp: 90 capsule, Rfl: 3   tiZANidine  (ZANAFLEX ) 4 MG tablet, Take 1 tablet (4 mg total) by mouth every 8 (eight) hours as needed for muscle spasms (do not drive for 8 hours after taking)., Disp: 30 tablet, Rfl: 0   Objective:     Vitals:   04/21/24 1338  Pulse: (!) 101  SpO2: 98%  Weight: (!) 304 lb (137.9 kg)  Height: 5' 10 (1.778 m)      Body mass index is 43.62 kg/m.    Physical Exam:    General:  awake, alert oriented, no acute distress nontoxic Skin: no suspicious lesions or rashes Neuro:sensation intact and strength 5/5 with no deficits, no atrophy, normal muscle tone Psych: No signs of anxiety, depression or other mood  disorder   Right knee: Moderate swelling No deformity Positive fluid wave, joint milking ROM Flex 100, Ext 0 TTP medial and lateral joint line, posterior fossa NTTP over the quad tendon, medial fem condyle, lat fem condyle, patella, plica, patella tendon, tibial tuberostiy, fibular head,   pes anserine bursa, gerdy's tubercle,    Gait antalgic, favoring left leg   Electronically signed by:  Odis Mace D.[facility] 2:12 PM 04/21/24

## 2024-04-20 NOTE — Telephone Encounter (Signed)
 Please see patient message and advise on need for redraw. I will be happy to schedule patient if needed.    Copied from CRM [id number]. Topic: Clinical - Lab/Test Results >> Apr 20, 2024  4:35 PM Winona R wrote: Pt would like to know if he should have his labs redone as he was told they test may have been faulty. Please contact the pt to inform him what should be done next.

## 2024-04-20 NOTE — Telephone Encounter (Signed)
 Dr. Wendolyn mentioned possible false positive on the ANA (even a repeat could still cause a false positive so would not recommend retesting) but still wanted you to see rheumatology-that is reasonable and can be referred under his reason for visit

## 2024-04-21 ENCOUNTER — Telehealth: Payer: Self-pay | Admitting: Family Medicine

## 2024-04-21 ENCOUNTER — Other Ambulatory Visit: Payer: Self-pay | Admitting: Family Medicine

## 2024-04-21 ENCOUNTER — Ambulatory Visit (INDEPENDENT_AMBULATORY_CARE_PROVIDER_SITE_OTHER): Payer: Self-pay | Admitting: Sports Medicine

## 2024-04-21 VITALS — HR 101 | Ht 70.0 in | Wt 304.0 lb

## 2024-04-21 DIAGNOSIS — M25561 Pain in right knee: Secondary | ICD-10-CM

## 2024-04-21 DIAGNOSIS — M25511 Pain in right shoulder: Secondary | ICD-10-CM

## 2024-04-21 DIAGNOSIS — G8929 Other chronic pain: Secondary | ICD-10-CM

## 2024-04-21 DIAGNOSIS — M1711 Unilateral primary osteoarthritis, right knee: Secondary | ICD-10-CM

## 2024-04-21 DIAGNOSIS — M25512 Pain in left shoulder: Secondary | ICD-10-CM

## 2024-04-21 MED ORDER — MELOXICAM 15 MG PO TABS: 15.0000 mg | ORAL_TABLET | Freq: Every day | ORAL | 1 refills | Status: DC | PRN

## 2024-04-21 NOTE — Telephone Encounter (Signed)
 Will call patient after 2pm.    Copied from CRM #[id number]. Topic: Clinical - Lab/Test Results >> Apr 20, 2024  4:35 PM Winona R wrote: Pt would like to know if he should have his labs redone as he was told they test may have been faulty. Please contact the pt to inform him what should be done next. >> Apr 21, 2024 12:34 PM Deleta S wrote: Patient is following up with this crm as well as missed call from office regarding labs and referral please contact the patient via phone anytime after 2pm

## 2024-04-21 NOTE — Patient Instructions (Signed)
 Tylenol  [phone] mg 2-3 times a day for pain relief   - Use meloxicam  15 mg daily as needed for pain.  Recommend limiting chronic NSAIDs to 1-2 doses per week to prevent long-term side effects.  As needed follow up if you get insurance and feel the need for injections or PT

## 2024-04-21 NOTE — Telephone Encounter (Signed)
 Left vm for pt requesting call back regarding labs and referral.

## 2024-04-22 ENCOUNTER — Telehealth: Payer: Self-pay | Admitting: Family Medicine

## 2024-04-22 NOTE — Telephone Encounter (Signed)
 Spoke with patient regarding notes from Dr. Katrinka and scheduled a follow up after rheumatology appt.    Copied from CRM [id number]. Topic: Clinical - Lab/Test Results >> Apr 21, 2024  4:23 PM Jasmin G wrote: Reason for CRM: Please call pt back at [phone] at your earliest convenience to go over recent test results.

## 2024-04-23 ENCOUNTER — Other Ambulatory Visit: Payer: Self-pay

## 2024-04-23 NOTE — Patient Instructions (Signed)
 Visit Information  Thank you for taking time to visit with me today. Please don't hesitate to contact me if I can be of assistance to you before our next scheduled appointment.  Your next care management appointment is by telephone on 05/04/24 at 1pm   Please call the care guide team at [phone] if you need to cancel, schedule, or reschedule an appointment.   Please call 911 if you are experiencing a Mental Health or Behavioral Health Crisis or need someone to talk to.  Tillman Gardener, BSW [place]  [facility], [facility] Social Worker Direct Dial: [phone]  Fax: [phone] Website: [email or web address]

## 2024-04-23 NOTE — Patient Outreach (Signed)
 Complex Care Management   Visit Note  04/23/2024  Name:  OCTAVE MONTROSE MRN: [id number] DOB: [birth year]  Situation: Referral received for Complex Care Management related to SDOH Barriers:  Medicaid application I obtained verbal consent from Patient.  Visit completed with patient  on the phone  Background:   Past Medical History:  Diagnosis Date   Arthritis    joints; from where I've had surgeries (08/17/2013)   Chest pain    a. 08/2013 Cardia CTA: Ca score 238 (95%), LM nl, LAD mod stenosis in D2 area, D1 mild ost stenosis, D2 no signif dzs, LCX small, RCA nl.   Coronary artery disease    LHC (08/18/13):  pLAD 50, CFX and RCA normal.  EF 55-65%.  LAD lesion FFR:  0.88 (normal).  Med rx recommended.     Dysphagia    Upper endoscopy 11/2013 - without obvious stricture s/p Maloney dilation.    GERD (gastroesophageal reflux disease)    Hyperlipidemia    a. Dx 3y ago - not on statin.   Hypertension    a. Dx 3y ago.   Hypothyroidism    a. on replacement.   IBS (irritable bowel syndrome)    colonoscopy 2000   Obesity    Sleep apnea    does not use CPAP   Snoring    Type II diabetes mellitus (HCC)     Assessment:  Patient reports he has been working on the [facility] application but can't get pass a certain section. SW t/c DSS Medicaid and left message to contact patient to assist with the application.  SW advised patient to visit DSS to complete the application.  SDOH Interventions    Flowsheet Row Patient Outreach Telephone from 03/01/2024 in [place] POPULATION HEALTH DEPARTMENT Office Visit from 03/31/2023 in [facility] [place] [facility] Visit from 11/19/2022 in [facility] [place] Telephone from 01/01/2022 in [facility]  SDOH Interventions      Food Insecurity Interventions Intervention Not Indicated -- -- Intervention Not Indicated  Housing Interventions Intervention Not Indicated -- -- Intervention Not  Indicated  Transportation Interventions Intervention Not Indicated  [Has a car] -- -- Intervention Not Indicated  Utilities Interventions Intervention Not Indicated -- -- --  Depression Interventions/Treatment  -- Counseling Counseling --  Financial Strain Interventions Intervention Not Indicated  [Father provides all support] -- -- Other (Comment)  [mailed [facility] Assistance and [facility]ed[facility]ist,  pt father covers most bills]      Recommendation:   none  Follow Up Plan:   Telephone follow up appointment date/time:  05/04/24 at 1pm  Tillman Gardener, BSW [place]  [facility], [facility] Social Worker Direct Dial: [phone]  Fax: [phone] Website: [email or web address]

## 2024-04-26 ENCOUNTER — Ambulatory Visit: Payer: Self-pay | Admitting: Family Medicine

## 2024-04-27 ENCOUNTER — Other Ambulatory Visit: Payer: Self-pay | Admitting: Family Medicine

## 2024-05-04 ENCOUNTER — Other Ambulatory Visit: Payer: Self-pay

## 2024-05-04 NOTE — Patient Outreach (Signed)
 Complex Care Management   Visit Note  05/04/2024  Name:  Gabriel [name] MRN: [id number] DOB: 1966-04-21  Situation: Referral received for Complex Care Management related to SDOH Barriers:  Medicaid I obtained verbal consent from Patient.  Visit completed with Patient  on the phone  Background:   Past Medical History:  Diagnosis Date   Arthritis    joints; from where I've had surgeries (08/17/2013)   Chest pain    a. 08/2013 Cardia CTA: Ca score 238 (95%), LM nl, LAD mod stenosis in D2 area, D1 mild ost stenosis, D2 no signif dzs, LCX small, RCA nl.   Coronary artery disease    LHC (08/18/13):  pLAD 50, CFX and RCA normal.  EF 55-65%.  LAD lesion FFR:  0.88 (normal).  Med rx recommended.     Dysphagia    Upper endoscopy 11/2013 - without obvious stricture s/p Maloney dilation.    GERD (gastroesophageal reflux disease)    Hyperlipidemia    a. Dx 3y ago - not on statin.   Hypertension    a. Dx 3y ago.   Hypothyroidism    a. on replacement.   IBS (irritable bowel syndrome)    colonoscopy 2000   Obesity    Sleep apnea    does not use CPAP   Snoring    Type II diabetes mellitus (HCC)     Assessment:  Patient completed Medicaid application but needs to submit required documentation.  Patient has additional questions about retro-coverage.  Patient plans to visit DSS to submit documentation and ask more detailed questions.  SDOH Interventions    Flowsheet Row Patient Outreach Telephone from 03/01/2024 in [place] POPULATION HEALTH DEPARTMENT Office Visit from 03/31/2023 in [facility] Visit from 11/19/2022 in [facility] [place] Telephone from 01/01/2022 in [facility]  SDOH Interventions      Food Insecurity Interventions Intervention Not Indicated -- -- Intervention Not Indicated  Housing Interventions Intervention Not Indicated -- -- Intervention Not Indicated  Transportation Interventions  Intervention Not Indicated  [Has a car] -- -- Intervention Not Indicated  Utilities Interventions Intervention Not Indicated -- -- --  Depression Interventions/Treatment  -- Counseling Counseling --  Financial Strain Interventions Intervention Not Indicated  [Father provides all support] -- -- Other (Comment)  [mailed [facility] Assistance and [facility]edAssist,  pt father covers most bills]      Recommendation:   None  Follow Up Plan:   Telephone follow up appointment date/time:  05/11/24 at 2pm  Tillman Gardener, BSW   [facility], [facility] Social Worker Direct Dial: [phone]  Fax: [phone] Website: [email or web address]

## 2024-05-04 NOTE — Progress Notes (Unsigned)
 Office Visit Note  Patient: Gabriel [name]             Date of Birth: 1966-06-13           MRN: [id number]             PCP: Katrinka Garnette KIDD, MD Referring: Wendolyn Jenkins Jansky, MD Visit Date: 05/05/2024 Occupation: @GUAROCC @  Subjective:  No chief complaint on file.    History of Present Illness: Gabriel [name] is a 58 y.o. male ***   Activities of Daily Living:  Patient reports morning stiffness for *** {minute/hour:19697}.   Patient {ACTIONS;DENIES/REPORTS:21021675::Denies} nocturnal pain.  Difficulty dressing/grooming: {ACTIONS;DENIES/REPORTS:21021675::Denies} Difficulty climbing stairs: {ACTIONS;DENIES/REPORTS:21021675::Denies} Difficulty getting out of chair: {ACTIONS;DENIES/REPORTS:21021675::Denies} Difficulty using hands for taps, buttons, cutlery, and/or writing: {ACTIONS;DENIES/REPORTS:21021675::Denies}  No Rheumatology ROS completed.    Rheum History: # Diagnosed in ***.  Manifestation of disease:   Serologies: (+) *** (-) ***  Maintenance Labs: QuantiFERON: *** Hepatitis panel: ***  Current Treatment ***  Prior Treatments ***   PMFS History:  Patient Active Problem List   Diagnosis Date Noted   Primary osteoarthritis of right knee 01/27/2024   Aortic atherosclerosis (HCC) 01/30/2023   Dyslipidemia 09/30/2021   Myalgia 03/27/2021   Chest pain of uncertain etiology 10/03/2019   Colon cancer screening 10/27/2018   Midepigastric pain 09/27/2018   Change in bowel habits 09/27/2018   Family hx colonic polyps 09/27/2018   BRBPR (bright red blood per rectum) 09/27/2018   Left lateral epicondylitis 03/18/2016   GERD (gastroesophageal reflux disease) 02/01/2015   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 08/2013 Cardia CTA: Ca score 238 (95%), LM nl, LAD mod stenosis in D2 area, D1 mild ost stenosis, D2 no signif dzs, LCX small, RCA nl.   Coronary artery disease    LHC (08/18/13):  pLAD 50, CFX and RCA normal.  EF 55-65%.  LAD lesion FFR:  0.88 (normal).  Med rx recommended.     Dysphagia    Upper endoscopy 11/2013 - without obvious stricture s/p Maloney dilation.    GERD (gastroesophageal reflux disease)    Hyperlipidemia    a. Dx 3y ago - not on statin.   Hypertension    a. Dx 3y ago.   Hypothyroidism    a. on replacement.   IBS (irritable bowel syndrome)    colonoscopy 2000   Obesity    Sleep apnea    does not use CPAP   Snoring    Type II diabetes mellitus (HCC)     Family History  Adopted: Yes  Problem Relation Age of Onset   Hypertension Mother    Obesity Mother    Diabetes Mother    Liver cancer Mother        with mets to the GB   Coronary artery disease Father        s/p MI in his early 30's->CABG x 5 @ age 68, currently 9.   Diabetes Father    Stroke Father    Hypertension Father    Diabetes Brother    Obesity Brother  Asthma Maternal Grandfather    Colon cancer Neg Hx    Esophageal cancer Neg Hx    Inflammatory bowel disease Neg Hx    Liver disease Neg Hx    Pancreatic cancer Neg Hx    Rectal cancer Neg Hx    Stomach cancer Neg Hx    Past Surgical History:  Procedure Laterality Date   ANKLE SURGERY Right    had a growth in it; cut it out (08/17/2013)   CARDIAC CATHETERIZATION N/A 09/28/2015   Procedure: Left Heart Cath and Coronary Angiography;  Surgeon: Peter M Swaziland, MD;  Location: [facility] INVASIVE CV LAB;  Service: Cardiovascular;  Laterality: N/A;   CHOLECYSTECTOMY     CORONARY PRESSURE/FFR STUDY N/A 10/04/2019   Procedure: INTRAVASCULAR PRESSURE WIRE/FFR STUDY;   Surgeon: Dann Candyce RAMAN, MD;  Location: [facility] INVASIVE CV LAB;  Service: Cardiovascular;  Laterality: N/A;   CORONARY STENT INTERVENTION N/A 10/04/2019   Procedure: CORONARY STENT INTERVENTION;  Surgeon: Dann Candyce RAMAN, MD;  Location: [facility] INVASIVE CV LAB;  Service: Cardiovascular;  Laterality: N/A;   KNEE ARTHROSCOPY Right X 2   LEFT HEART CATH AND CORONARY ANGIOGRAPHY N/A 10/04/2019   Procedure: LEFT HEART CATH AND CORONARY ANGIOGRAPHY;  Surgeon: Dann Candyce RAMAN, MD;  Location: [facility] INVASIVE CV LAB;  Service: Cardiovascular;  Laterality: N/A;   LEFT HEART CATHETERIZATION WITH CORONARY ANGIOGRAM N/A 08/18/2013   Procedure: LEFT HEART CATHETERIZATION WITH CORONARY ANGIOGRAM;  Surgeon: Peter M Swaziland, MD;  Location: [facility] CATH LAB;  Service: Cardiovascular;  Laterality: N/A;   SHOULDER ARTHROSCOPY W/ ROTATOR CUFF REPAIR Left    Social History   Social History Narrative   Lives in [place] with parents (cares for parents).  Never married. Unemployed.     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[employer] (stops intermittently while caring for parents)   Immunization History  Administered Date(s) Administered   Tdap 07/22/2011     Objective: Vital Signs: There were no vitals taken for this visit.   Physical Exam   Musculoskeletal Exam: ***  CDAI Exam: CDAI Score: -- Patient Global: --; Provider Global: -- Swollen: --; Tender: -- Joint Exam 05/05/2024   No joint exam has been documented for this visit   There is currently no information documented on the homunculus. Go to the Rheumatology activity and complete the homunculus joint exam.  Investigation: No additional findings.  Imaging: No results found.  Recent Labs: Lab Results  Component Value Date   WBC 7.6 04/09/2024   HGB 12.9 (L) 04/09/2024   PLT 322 04/09/2024   NA 135 04/09/2024   K 4.4 04/09/2024   CL 97 (L) 04/09/2024   CO2 28 04/09/2024   GLUCOSE 277 (H) 04/09/2024   BUN 12 04/09/2024   CREATININE 0.74  04/09/2024   BILITOT 0.7 04/09/2024   ALKPHOS 82 11/10/2023   AST 16 04/09/2024   ALT 22 04/09/2024   PROT 6.8 04/09/2024   ALBUMIN 4.2 11/10/2023   CALCIUM  9.4 04/09/2024   GFRAA 100 10/13/2020    Speciality Comments: No specialty comments available.  Procedures:  No procedures performed Allergies: Patient has no known allergies.   Assessment / Plan:     Visit Diagnoses: No diagnosis found.  #High risk medication use  Orders: No orders of the defined types were placed in this encounter.  No orders of the defined types were placed in this encounter.   Face-to-face time spent with patient was *** minutes. Greater than 50% of time was spent in counseling and coordination of care.  Follow-Up Instructions: No follow-ups on file.   Asberry Claw, DO

## 2024-05-04 NOTE — Patient Instructions (Signed)
 Visit Information  Thank you for taking time to visit with me today. Please don't hesitate to contact me if I can be of assistance to you before our next scheduled appointment.  Your next care management appointment is by telephone on 05/11/24 at 2pm   Please call the care guide team at [phone] if you need to cancel, schedule, or reschedule an appointment.   Please call 911 if you are experiencing a Mental Health or Behavioral Health Crisis or need someone to talk to.  Tillman Gardener, BSW [place]  [facility], [facility] Social Worker Direct Dial: [phone]  Fax: [phone] Website: [email or web address]

## 2024-05-05 ENCOUNTER — Ambulatory Visit: Payer: Self-pay

## 2024-05-05 ENCOUNTER — Ambulatory Visit (INDEPENDENT_AMBULATORY_CARE_PROVIDER_SITE_OTHER): Payer: Self-pay

## 2024-05-05 VITALS — BP 134/85 | HR 103 | Resp 15 | Ht 67.75 in | Wt 301.0 lb

## 2024-05-05 DIAGNOSIS — R768 Other specified abnormal immunological findings in serum: Secondary | ICD-10-CM

## 2024-05-05 DIAGNOSIS — M79642 Pain in left hand: Secondary | ICD-10-CM

## 2024-05-05 DIAGNOSIS — M1990 Unspecified osteoarthritis, unspecified site: Secondary | ICD-10-CM

## 2024-05-05 DIAGNOSIS — M79641 Pain in right hand: Secondary | ICD-10-CM

## 2024-05-05 MED ORDER — MELOXICAM 15 MG PO TABS: 15.0000 mg | ORAL_TABLET | Freq: Every day | ORAL | 0 refills | Status: AC

## 2024-05-11 ENCOUNTER — Other Ambulatory Visit: Payer: Self-pay

## 2024-05-11 NOTE — Patient Outreach (Signed)
 Complex Care Management   Visit Note  05/11/2024  Name:  Gabriel [name] MRN: [id number] DOB: August 20, 1966  Situation: Referral received for Complex Care Management related to SDOH Barriers:  Medicaid application I obtained verbal consent from Patient.  Visit completed with Patient  on the phone  Background:   Past Medical History:  Diagnosis Date   Arthritis    joints; from where I've had surgeries (08/17/2013)   Chest pain    a. 08/2013 Cardia CTA: Ca score 238 (95%), LM nl, LAD mod stenosis in D2 area, D1 mild ost stenosis, D2 no signif dzs, LCX small, RCA nl.   Coronary artery disease    LHC (08/18/13):  pLAD 50, CFX and RCA normal.  EF 55-65%.  LAD lesion FFR:  0.88 (normal).  Med rx recommended.     Dysphagia    Upper endoscopy 11/2013 - without obvious stricture s/p Maloney dilation.    GERD (gastroesophageal reflux disease)    Hyperlipidemia    a. Dx 3y ago - not on statin.   Hypertension    a. Dx 3y ago.   Hypothyroidism    a. on replacement.   IBS (irritable bowel syndrome)    colonoscopy 2000   Obesity    Sleep apnea    does not use CPAP   Snoring    Type II diabetes mellitus (HCC)     Assessment:  Patient completed Medicaid application and received follow up paperwork to complete. Patient will return this week.  Patient does not request a follow up visit.  SDOH Interventions    Flowsheet Row Patient Outreach Telephone from 03/01/2024 in [place] [facility]PARTMENT Office Visit from 03/31/2023 in [facility] Visit from 11/19/2022 in [facility] [place] Telephone from 01/01/2022 in [facility]  SDOH Interventions      Food Insecurity Interventions Intervention Not Indicated -- -- Intervention Not Indicated  Housing Interventions Intervention Not Indicated -- -- Intervention Not Indicated  Transportation Interventions Intervention Not Indicated  [Has a car] -- --  Intervention Not Indicated  Utilities Interventions Intervention Not Indicated -- -- --  Depression Interventions/Treatment  -- Counseling Counseling --  Financial Strain Interventions Intervention Not Indicated  [Father provides all support] -- -- Other (Comment)  [mailed [facility] Financial Assistance and [employer],  pt father covers most bills]      Recommendation:   none  Follow Up Plan:   Patient has met all care management goals. Care Management case will be closed. Patient has been provided contact information should new needs arise.   Tillman Gardener, BSW [place]  [facility], [facility] Social Worker Direct Dial: [phone]  Fax: [phone] Website: [email or web address]

## 2024-05-11 NOTE — Patient Instructions (Signed)

## 2024-05-14 ENCOUNTER — Ambulatory Visit: Payer: Self-pay | Admitting: Family Medicine

## 2024-06-02 ENCOUNTER — Other Ambulatory Visit: Payer: Self-pay | Admitting: Cardiology

## 2024-06-23 NOTE — Progress Notes (Unsigned)
 Cardiology Office Note:   Date:  06/24/2024  ID:  Gabriel [name], DOB [birth year], MRN [id number] PCP: Katrinka Garnette KIDD, MD   HeartCare Providers Cardiologist:  Lynwood Schilling, MD {  History of Present Illness:   Gabriel [name] is a 58 y.o. male  who presents for follow up of CAD which is being managed medically.   He has a history of an LAD stenosis of 50% demonstrated in December 2014. He had a normal  FFR at that time.  He was in the emergency room however in 2017 with chest discomfort. He was seen in consultation. It was felt to be somewhat atypical pain. He had some chest discomfort with burping. It did not respond to treatment with an antacid. His cardiac markers were negative in the ER. He did report having used nitroglycerin  about one month prior to this. He had a stress perfusion study which demonstrated an EF of 56%. There was a medium-sized moderate defect in the basal inferoseptal mid anteroseptal and mid inferoseptal apical region.  He had a cardiac cath on 09/28/15 and was found to have 40% LAD stenosis.   He is status post cardiac catheterization in 2021 with PCI to the LAD.  On  03/27/2021 he talked to Dr. Santo, via telemedicine visit. He had chest pain and a nuclear medicine stress test was ordered.  This was negative for ischemia in August 2022.     Since I last saw him he has been having chest pain.  He called to be added to the schedule.  He says he has been getting chest discomfort.  This has happened about 3 times since Sunday.  He had to take a couple of nitroglycerin .  He says he is having right sided discomfort and a little bit on his left side.  This seems to be 2 out of 10 in intensity.  He thinks it might be similar to his previous angina but he has trouble telling this from GI pain.  He has not had any clear triggers such as food however.  It does seem to happen at rest.  He has done some walking as his dad has been in the hospital and he has had to  attend to him and has been walking through the hospital.  He denies being over bring this on with that.  He is not having any new shortness of breath, PND or orthopnea.  He has some occasional dyspnea that has been chronic.  He is not having any palpitations, presyncope or syncope.  ROS: As stated in the HPI and negative for all other systems.  Studies Reviewed:    EKG:     Normal sinus rhythm, rate 80, leftward axis, intervals within normal limits, no acute ST-T wave changes, November 10, 2023    Risk Assessment/Calculations:              Physical Exam:   VS:  BP 120/76 (BP Location: Left Arm, Patient Position: Sitting, Cuff Size: Large)   Pulse 95   Ht 5' 8 (1.727 m)   Wt (!) 309 lb 11.2 oz (140.5 kg)   SpO2 93%   BMI 47.09 kg/m    Wt Readings from Last 3 Encounters:  06/24/24 (!) 309 lb 11.2 oz (140.5 kg)  05/05/24 (!) 301 lb (136.5 kg)  04/21/24 (!) 304 lb (137.9 kg)     GEN: Well nourished, well developed in no acute distress NECK: No JVD; No carotid bruits CARDIAC: RRR, no murmurs, rubs, gallops  RESPIRATORY:  Clear to auscultation without rales, wheezing or rhonchi  ABDOMEN: Soft, non-tender, non-distended EXTREMITIES:  No edema; No deformity   ASSESSMENT AND PLAN:   CAD: The patient is having some chest discomfort that has some nonanginal greater than anginal features but he does have known coronary disease with previous LAD stenting.  He has significant problems with sciatica and would not be able to walk on a treadmill.  He needs perfusion imaging.  With his body habitus PET scanning is indicated.   Sleep apnea:    He is unable to afford CPAP.  At target.  No change in therapy.    Hypertension:  The blood pressure is   Dyslipidemia:  LDL is 44.  No change in therapy.  Diabetes Mellitus:  A1c was 8.9 which is down from 9.5.  Plans per his primary provider.   Obesity:   He is now on Ozempic.       Follow up in 6 months or sooner if needed.  Signed, Lynwood Schilling, MD

## 2024-06-24 ENCOUNTER — Ambulatory Visit: Payer: Self-pay | Attending: Cardiology | Admitting: Cardiology

## 2024-06-24 ENCOUNTER — Encounter: Payer: Self-pay | Admitting: Cardiology

## 2024-06-24 VITALS — BP 120/76 | HR 95 | Ht 68.0 in | Wt 309.7 lb

## 2024-06-24 DIAGNOSIS — R079 Chest pain, unspecified: Secondary | ICD-10-CM

## 2024-06-24 DIAGNOSIS — E785 Hyperlipidemia, unspecified: Secondary | ICD-10-CM

## 2024-06-24 DIAGNOSIS — E118 Type 2 diabetes mellitus with unspecified complications: Secondary | ICD-10-CM

## 2024-06-24 DIAGNOSIS — I25118 Atherosclerotic heart disease of native coronary artery with other forms of angina pectoris: Secondary | ICD-10-CM

## 2024-06-24 DIAGNOSIS — I10 Essential (primary) hypertension: Secondary | ICD-10-CM

## 2024-06-24 NOTE — Patient Instructions (Addendum)
 Medication Instructions:  Your physician recommends that you continue on your current medications as directed. Please refer to the Current Medication list given to you today.  *If you need a refill on your cardiac medications before your next appointment, please call your pharmacy*  Lab Work: At follow up appointment: Lipids If you have labs (blood work) drawn today and your tests are completely normal, you will receive your results only by: MyChart Message (if you have MyChart) OR A paper copy in the mail If you have any lab test that is abnormal or we need to change your treatment, we will call you to review the results.  Testing/Procedures:  Please report to Radiology at the [facility] Main Entrance 30 minutes early for your test.  [address] [place], [place] [address]                         OR   Please report to Radiology at [facility] Main Entrance, medical mall, 30 mins prior to your test.  [address]  [place], [place]  How to Prepare for Your Cardiac PET/CT Stress Test:  Nothing to eat or drink, except water, 3 hours prior to arrival time.  NO caffeine/decaffeinated products, or chocolate 12 hours prior to arrival. (Please note decaffeinated beverages (teas/coffees) still contain caffeine).  If you have caffeine within 12 hours prior, the test will need to be rescheduled.  Medication instructions: Do not take erectile dysfunction medications for 72 hours prior to test (sildenafil, tadalafil) Do not take nitrates (isosorbide  mononitrate, Ranexa) the day before or day of test Do not take tamsulosin  the day before or morning of test Hold theophylline containing medications for 12 hours. Hold Dipyridamole 48 hours prior to the test.  Diabetic Preparation: If able to eat breakfast prior to 3 hour fasting, you may take all medications, including your insulin . Do not worry if you miss your breakfast dose of insulin  - start at your  next meal. If you do not eat prior to 3 hour fast-Hold all diabetes (oral and insulin ) medications. Patients who wear a continuous glucose monitor MUST remove the device prior to scanning.  You may take your remaining medications with water.  NO perfume, cologne or lotion on chest or abdomen area. FEMALES - Please avoid wearing dresses to this appointment.  Total time is 1 to 2 hours; you may want to bring reading material for the waiting time.  IF YOU THINK YOU MAY BE PREGNANT, OR ARE NURSING PLEASE INFORM THE TECHNOLOGIST.  In preparation for your appointment, medication and supplies will be purchased.  Appointment availability is limited, so if you need to cancel or reschedule, please call the Radiology Department Scheduler at [phone] 24 hours in advance to avoid a cancellation fee of $100.00  What to Expect When you Arrive:  Once you arrive and check in for your appointment, you will be taken to a preparation room within the Radiology Department.  A technologist or Nurse will obtain your medical history, verify that you are correctly prepped for the exam, and explain the procedure.  Afterwards, an IV will be started in your arm and electrodes will be placed on your skin for EKG monitoring during the stress portion of the exam. Then you will be escorted to the PET/CT scanner.  There, staff will get you positioned on the scanner and obtain a blood pressure and EKG.  During the exam, you will continue to be  connected to the EKG and blood pressure machines.  A small, safe amount of a radioactive tracer will be injected in your IV to obtain a series of pictures of your heart along with an injection of a stress agent.    After your Exam:  It is recommended that you eat a meal and drink a caffeinated beverage to counter act any effects of the stress agent.  Drink plenty of fluids for the remainder of the day and urinate frequently for the first couple of hours after the exam.  Your doctor will  inform you of your test results within 7-10 business days.  For more information and frequently asked questions, please visit our website: [email or web address]  For questions about your test or how to prepare for your test, please call: Cardiac Imaging Nurse Navigators Office: [phone]   Follow-Up: At [facility], you and your health needs are our priority.  As part of our continuing mission to provide you with exceptional heart care, our providers are all part of one team.  This team includes your primary Cardiologist (physician) and Advanced Practice Providers or APPs (Physician Assistants and Nurse Practitioners) who all work together to provide you with the care you need, when you need it.  Your next appointment:   3 month(s) after PET scan   Provider:   One of our Advanced Practice Providers (APPs): Morse Clause, PA-C  Lamarr Satterfield, NP Miriam Shams, NP  Olivia Pavy, PA-C Josefa Beauvais, NP  Leontine Salen, PA-C Orren Fabry, PA-C  [place], PA-C Ernest Dick, NP  Damien Braver, NP Jon Hails, PA-C  Waddell Donath, PA-C    Dayna Dunn, PA-C  Scott Weaver, PA-C Lum Louis, NP Katlyn West, NP Callie Goodrich, PA-C  Xika Zhao, NP Sheng Haley, PA-C    Kathleen Johnson, PA-C

## 2024-06-25 ENCOUNTER — Telehealth: Payer: Self-pay | Admitting: Licensed Clinical Social Worker

## 2024-06-25 NOTE — Progress Notes (Signed)
 H&V Care Navigation CSW Progress Note  Clinical Social Worker contacted patient by phone to f/u on self pay status. Previously assessed pt in 2023, prior to Medicaid expansion. Was able to reach pt again today at [phone]. Introduced self, role, reason for call. Pt confirmed DOB and home address. Still resides with his father, who covers all bills while pt provides care. Pt not employed, no current income. Had worked with [employer] BSW to complete Medicaid application but unfortunately pt states he was denied which with no income did not make sense. When we discussed further pt may have been including his father's income as household income. Recommended bringing letter to DSS to review and clarify with caseworker and to re-apply if needed. Pt denies any issues with transportation, food, medications or housing costs with help of father. Recommended if denied again for Medicaid that we discuss Marketplace and CAFA options for assistance, will send this information and [employer] application to relieve some cost burden from his father.   No additional questions at this time. Will f/u to ensure paperwork is received and answer any additional questions.  Patient is participating in a Managed Medicaid Plan:  No, self pay only  SDOH Screenings   Food Insecurity: No Food Insecurity (06/25/2024)  Housing: Low Risk  (06/25/2024)  Transportation Needs: No Transportation Needs (06/25/2024)  Utilities: Not At Risk (06/25/2024)  Alcohol Screen: Low Risk  (11/10/2023)  Depression (PHQ2-9): Low Risk  (03/18/2024)  Financial Resource Strain: Low Risk  (06/25/2024)  Physical Activity: Insufficiently Active (11/10/2023)  Social Connections: Moderately Integrated (11/10/2023)  Stress: No Stress Concern Present (11/10/2023)  Tobacco Use: Medium Risk (06/24/2024)  Health Literacy: Adequate Health Literacy (06/25/2024)    Marit Lark, MSW, LCSW Clinical Social Worker II [facility] Heart/Vascular Care Navigation   [phone]- work cell phone (preferred)

## 2024-06-28 ENCOUNTER — Ambulatory Visit (INDEPENDENT_AMBULATORY_CARE_PROVIDER_SITE_OTHER): Payer: Self-pay | Admitting: Family Medicine

## 2024-06-28 ENCOUNTER — Encounter: Payer: Self-pay | Admitting: Family Medicine

## 2024-06-28 VITALS — BP 122/68 | HR 111 | Temp 97.7°F | Ht 68.0 in | Wt 310.4 lb

## 2024-06-28 DIAGNOSIS — I10 Essential (primary) hypertension: Secondary | ICD-10-CM

## 2024-06-28 DIAGNOSIS — E119 Type 2 diabetes mellitus without complications: Secondary | ICD-10-CM

## 2024-06-28 DIAGNOSIS — E039 Hypothyroidism, unspecified: Secondary | ICD-10-CM

## 2024-06-28 DIAGNOSIS — Z125 Encounter for screening for malignant neoplasm of prostate: Secondary | ICD-10-CM

## 2024-06-28 DIAGNOSIS — Z7984 Long term (current) use of oral hypoglycemic drugs: Secondary | ICD-10-CM

## 2024-06-28 NOTE — Patient Instructions (Addendum)
 a1c sounds to still be poorly controlled. Check when he ocmes back for labs BUT he's going to try to do the Ozempic even just the 0.25 mg as doesn't tolerate 0.5 mg and see how sugar does- if still over 180 most of time and no #s below 120- he's going to increase to 22 units insulin  in am and 11 units in PM  Schedule a lab visit at the check out desk within 2-3j weeks. Return for future fasting labs meaning nothing but water after midnight please. Ok to take your medications with water.    Recommended follow up: Return in about 4 months (around 10/29/2024) for followup or sooner if needed.Schedule b4 you leave.

## 2024-06-28 NOTE — Progress Notes (Signed)
 Phone [phone] In person visit   Subjective:   Gabriel [name] is a 58 y.o. 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    Priority: Medium    Morbid obesity (HCC) 08/10/2013    Priority: Medium    Essential hypertension 02/06/2011    Priority: Medium    Hypothyroidism 02/06/2011    Priority: Medium    Hyperlipidemia associated with type 2 diabetes mellitus (HCC) 02/06/2011    Priority: Medium    Colon cancer screening 10/27/2018    Priority: Low   Midepigastric pain 09/27/2018    Priority: Low   Change in bowel habits 09/27/2018    Priority: Low   Family hx colonic polyps 09/27/2018    Priority: Low   BRBPR (bright red blood per rectum) 09/27/2018    Priority: Low   Left lateral epicondylitis 03/18/2016    Priority: Low   GERD (gastroesophageal reflux disease) 02/01/2015    Priority: Low   Dysphagia 09/30/2013    Priority: Low   Primary osteoarthritis of right knee 01/27/2024   Aortic atherosclerosis 01/30/2023   Dyslipidemia 09/30/2021   Myalgia 03/27/2021   Chest pain of uncertain etiology 10/03/2019    Medications- reviewed and updated Current Outpatient Medications  Medication Sig Dispense Refill   acetaminophen  (TYLENOL ) 500 MG tablet Take 1,000 mg by mouth every 6 (six) hours as needed for mild pain or moderate pain. Reported on 01/09/2016     aspirin  EC 81 MG EC tablet Take 1 tablet (81 mg total) by mouth daily.     glimepiride  (AMARYL ) 4 MG tablet TAKE 2 TABLETS BY MOUTH ONCE DAILY BEFORE BREAKFAST 180 tablet 0   glucose blood (ONE TOUCH TEST STRIPS) test strip Up to 4  times daily.  Onetouch Verio.  E11.65 100 each 12   insulin  NPH-regular Human (70-30) 100 UNIT/ML injection Inject 10-30 Units into the skin 2 (two) times daily with a meal. Start with 20 units am and 10 units in the PM and update me after a week 10 mL 11   Lancets (ONETOUCH ULTRASOFT) lancets Up to 4 times daily.  Onetouch Verio.  DxE11.65 100 each 12   levothyroxine  (SYNTHROID ) 125 MCG tablet TAKE 1 TABLET BY MOUTH ONCE DAILY BEFORE BREAKFAST 90 tablet 0   losartan  (COZAAR ) 100 MG tablet Take 1 tablet by mouth once daily 60 tablet 0   meloxicam  (MOBIC ) 15 MG tablet Take 1 tablet (15 mg total) by mouth daily as needed for pain. 30 tablet 1   metFORMIN  (GLUCOPHAGE ) 1000 MG tablet TAKE 1 TABLET BY MOUTH TWICE DAILY WITH A MEAL 180 tablet 0   Multiple Vitamins-Minerals (MULTIVITAMIN,TX-MINERALS) tablet Take 1 tablet by mouth daily.     nitroGLYCERIN  (NITROSTAT ) 0.4 MG SL tablet Place 1 tablet (0.4 mg total) under the tongue every 5 (five) minutes as needed for chest pain (CP or SOB). 25 tablet 3   Omega-3 Fatty Acids (FISH OIL) 1000 MG CAPS Take 1,000 mg by mouth every other day.     omeprazole  (PRILOSEC) 20 MG capsule Take 1 capsule (20 mg total) by mouth daily. 30 capsule  3   rosuvastatin  (CRESTOR ) 5 MG tablet Take 1 tablet by mouth once daily 90 tablet 0   Semaglutide,0.25 or 0.5MG /DOS, (OZEMPIC, 0.25 OR 0.5 MG/DOSE,) 2 MG/3ML SOPN 0.25 mg x 4 weeks, then increase to 0.5 mg weekly.     tamsulosin  (FLOMAX ) 0.4 MG CAPS capsule Take 1 capsule (0.4 mg total) by mouth daily. 90 capsule 3   tiZANidine  (ZANAFLEX ) 4 MG tablet Take 1 tablet (4 mg total) by mouth every 8 (eight) hours as needed for muscle spasms (do not drive for 8 hours after taking). 30 tablet 0   No current facility-administered medications for this visit.     Objective:  BP 122/68 (BP Location: Left Arm, Patient Position: Sitting, Cuff Size: Normal)   Pulse (!) 111   Temp 97.7 F (36.5 C) (Temporal)   Ht 5' 8 (1.727 m)   Wt (!)  310 lb 6.4 oz (140.8 kg)   SpO2 93%   BMI 47.20 kg/m  Gen: NAD, resting comfortably CV: RRR no murmurs rubs or gallops Lungs: CTAB no crackles, wheeze, rhonchi Ext: trace edema Skin: warm, dry    Assessment and Plan   #social update- dad in [place]   #hand pain- on meloxicam  for this and warned about cardiac risks and bleeding risk -sports medicine related- points to sciatica pain as well  # CAD-follows with Dr. Lavona #hyperlipidemia #aortic atherosclerosis S: Medication:Aspirin  81 mg, fish oil, rosuvastatin  5 mg daily Lab Results  Component Value Date   CHOL 117 11/19/2022   HDL 36.50 (L) 11/19/2022   LDLCALC 44 11/19/2022   LDLDIRECT 68.0 03/11/2019   TRIG 183.0 (H) 11/19/2022   CHOLHDL 3 11/19/2022   A/P: has been having some chest pains in right chest (wonders if anxiety playing a role) and upcoming PET perfusion imaging- continue current medications . Cholesterol has been at g[place]- will come back fasting- continue rosuvastaitn, aspirin  , fish oil   # Diabetes with morbid obesity S: Medication: Glimepiride  8 mg, insulin  NPH 7030 twice daily 20 units in the morning, 10 units in the p.m., metformin  1000 mg twice daily, tried to restart Ozempic 0.25 mg but hasn't felt great on the 5 mg - did help with appetite Exercise and diet- on road a lot has bene tough - may be worse. 200 in mornings and hovering most of day. No lows Lab Results  Component Value Date   HGBA1C 8.9 (A) 03/18/2024   HGBA1C 8.0 (A) 11/25/2023   HGBA1C 9.9 (H) 08/19/2023    Wt Readings from Last 3 Encounters:  06/28/24 (!) 310 lb 6.4 oz (140.8 kg)  06/24/24 (!) 309 lb 11.2 oz (140.5 kg)  05/05/24 (!) 301 lb (136.5 kg)     A/P: a1c sounds to still be poorly controlled. Check when he ocmes back for labs BUT he's going to try to do the Ozempic even just the 0.25 mg as doesn't tolerate 0.5 mg and see how sugar does- if still over 180 most of time and no #s below 120- he's going to increase to 22  units insulin  in am and 11 units in PM Morbid obesity- Encouraged need for healthy eating, regular exercise, weight loss.    #hypertension S: medication: Losartan  100 mg BP Readings from Last 3 Encounters:  06/28/24 122/68  06/24/24 120/76  05/05/24 134/85  A/P: stable- continue current medicines   #hypothyroidism S: compliant On thyroid  medication-levothyroxine  125 mcg  Lab Results  Component Value Date   TSH 2.76 04/09/2024  A/P:well controlled continue current medications    #  OSA-unable to afford CPAP-has to focus on weight loss    # BPH-on tamsulosin  0.4 mg - helps some   # GERD with hiatal hernia S:Medication: Omeprazole  20 mg B12 levels related to PPI use: Under 400 in past A/P: reasonable control- Chronic Care Management (CCM) e      Recommended follow up: Return in about 4 months (around 10/29/2024) for followup or sooner if needed.Schedule b4 you leave. No future appointments.  Lab/Order associations: eggs, bacon a few hours ago    ICD-10-CM   1. Diabetes mellitus without complication (HCC)  E11.9 Comprehensive metabolic panel with GFR    CBC with Differential/Platelet    Lipid panel    Hemoglobin A1c    2. Screening for prostate cancer  Z12.5 PSA      No orders of the defined types were placed in this encounter.   Return precautions advised.  Garnette Lukes, MD

## 2024-06-30 ENCOUNTER — Ambulatory Visit (HOSPITAL_COMMUNITY): Payer: Self-pay

## 2024-07-08 ENCOUNTER — Other Ambulatory Visit: Payer: Self-pay | Admitting: Sports Medicine

## 2024-07-08 ENCOUNTER — Telehealth (HOSPITAL_BASED_OUTPATIENT_CLINIC_OR_DEPARTMENT_OTHER): Payer: Self-pay | Admitting: Licensed Clinical Social Worker

## 2024-07-08 NOTE — Telephone Encounter (Signed)
 H&V Care Navigation CSW Progress Note  Clinical Social Worker contacted patient by phone to f/u on patient assistance forms and recommendation to go to DSS to speak with caseworker about denial. LCSW reached pt at [phone]. Pt shares his father had extended hospital stay and SNF placement for infection post pressure ulcer infection. He hasn't had a chance to review resources sent. He is agreeable with me following up in a few weeks. No additional questions, provided verbal encouragement for pt as he continues to care for his father, here for supportive resources as needed.  Patient is participating in a Managed Medicaid Plan:  No, self pay only  SDOH Screenings   Food Insecurity: No Food Insecurity (06/25/2024)  Housing: Low Risk  (06/25/2024)  Transportation Needs: No Transportation Needs (06/25/2024)  Utilities: Not At Risk (06/25/2024)  Alcohol Screen: Low Risk  (11/10/2023)  Depression (PHQ2-9): Low Risk  (06/28/2024)  Financial Resource Strain: Low Risk  (06/25/2024)  Physical Activity: Insufficiently Active (11/10/2023)  Social Connections: Moderately Integrated (11/10/2023)  Stress: No Stress Concern Present (11/10/2023)  Tobacco Use: Medium Risk (06/28/2024)  Health Literacy: Adequate Health Literacy (06/25/2024)    Marit Lark, MSW, LCSW Clinical Social Worker II [facility] Heart/Vascular Care Navigation  [phone]- work cell phone (preferred)

## 2024-07-19 ENCOUNTER — Other Ambulatory Visit: Payer: Self-pay

## 2024-07-27 ENCOUNTER — Other Ambulatory Visit: Payer: Self-pay | Admitting: Cardiology

## 2024-07-27 ENCOUNTER — Other Ambulatory Visit: Payer: Self-pay | Admitting: Family Medicine

## 2024-07-28 ENCOUNTER — Telehealth (HOSPITAL_BASED_OUTPATIENT_CLINIC_OR_DEPARTMENT_OTHER): Payer: Self-pay | Admitting: Licensed Clinical Social Worker

## 2024-07-28 ENCOUNTER — Other Ambulatory Visit: Payer: Self-pay | Admitting: Family Medicine

## 2024-07-28 NOTE — Telephone Encounter (Signed)
 H&V Care Navigation CSW Progress Note  Clinical Social Worker contacted patient by phone to f/u again on self pay status/assistance forms. Pt reached at [phone]. Shares he is still focused on helping dad at home and hasn't complete/reviewed any information. LCSW remains available should pt reach out for any additional support. No additional questions today.  Patient is participating in a Managed Medicaid Plan:  No, self pay only  SDOH Screenings   Food Insecurity: No Food Insecurity (06/25/2024)  Housing: Low Risk  (06/25/2024)  Transportation Needs: No Transportation Needs (06/25/2024)  Utilities: Not At Risk (06/25/2024)  Alcohol Screen: Low Risk  (11/10/2023)  Depression (PHQ2-9): Low Risk  (06/28/2024)  Financial Resource Strain: Low Risk  (06/25/2024)  Physical Activity: Insufficiently Active (11/10/2023)  Social Connections: Moderately Integrated (11/10/2023)  Stress: No Stress Concern Present (11/10/2023)  Tobacco Use: Medium Risk (06/28/2024)  Health Literacy: Adequate Health Literacy (06/25/2024)    Marit Lark, MSW, LCSW Clinical Social Worker II [facility] Heart/Vascular Care Navigation  [phone]- work cell phone (preferred)

## 2024-08-11 ENCOUNTER — Other Ambulatory Visit: Payer: Self-pay

## 2024-09-13 ENCOUNTER — Other Ambulatory Visit: Payer: Self-pay

## 2024-11-01 ENCOUNTER — Ambulatory Visit: Payer: Self-pay | Admitting: Family Medicine
